# Patient Record
Sex: Male | Born: 1945
Health system: Southern US, Community
[De-identification: ages and names within clinical notes are randomized; demographics above are authoritative.]

## PROBLEM LIST (undated history)

## (undated) DIAGNOSIS — K625 Hemorrhage of anus and rectum: Secondary | ICD-10-CM

## (undated) DIAGNOSIS — I739 Peripheral vascular disease, unspecified: Secondary | ICD-10-CM

## (undated) DIAGNOSIS — C61 Malignant neoplasm of prostate: Secondary | ICD-10-CM

## (undated) DIAGNOSIS — Z720 Tobacco use: Secondary | ICD-10-CM

## (undated) DIAGNOSIS — I10 Essential (primary) hypertension: Secondary | ICD-10-CM

## (undated) DIAGNOSIS — E785 Hyperlipidemia, unspecified: Secondary | ICD-10-CM

## (undated) DIAGNOSIS — I779 Disorder of arteries and arterioles, unspecified: Secondary | ICD-10-CM

## (undated) HISTORY — PX: PENILE PROSTHESIS IMPLANT: SHX240

## (undated) HISTORY — PX: CERVICAL SPINE SURGERY: SHX589

## (undated) HISTORY — DX: Peripheral vascular disease, unspecified: I73.9

## (undated) HISTORY — PX: KNEE CARTILAGE SURGERY: SHX688

## (undated) HISTORY — DX: Hyperlipidemia, unspecified: E78.5

## (undated) HISTORY — DX: Tobacco use: Z72.0

---

## 1999-12-09 ENCOUNTER — Observation Stay (HOSPITAL_COMMUNITY): Admission: RE | Admit: 1999-12-09 | Discharge: 1999-12-10 | Payer: Self-pay | Admitting: Neurosurgery

## 2000-01-02 ENCOUNTER — Encounter: Admission: RE | Admit: 2000-01-02 | Discharge: 2000-01-02 | Payer: Self-pay | Admitting: Neurosurgery

## 2000-02-21 ENCOUNTER — Encounter: Admission: RE | Admit: 2000-02-21 | Discharge: 2000-02-21 | Payer: Self-pay | Admitting: Neurosurgery

## 2000-09-25 ENCOUNTER — Encounter: Admission: RE | Admit: 2000-09-25 | Discharge: 2000-09-25 | Payer: Self-pay | Admitting: Neurosurgery

## 2000-12-07 ENCOUNTER — Encounter: Admission: RE | Admit: 2000-12-07 | Discharge: 2000-12-07 | Payer: Self-pay | Admitting: Urology

## 2000-12-07 ENCOUNTER — Encounter: Payer: Self-pay | Admitting: Urology

## 2001-08-18 HISTORY — PX: PROSTATE SURGERY: SHX751

## 2001-11-04 ENCOUNTER — Encounter: Payer: Self-pay | Admitting: Urology

## 2001-11-04 ENCOUNTER — Encounter: Admission: RE | Admit: 2001-11-04 | Discharge: 2001-11-04 | Payer: Self-pay | Admitting: Urology

## 2001-12-14 ENCOUNTER — Encounter: Payer: Self-pay | Admitting: Urology

## 2001-12-20 ENCOUNTER — Encounter (INDEPENDENT_AMBULATORY_CARE_PROVIDER_SITE_OTHER): Payer: Self-pay | Admitting: Specialist

## 2001-12-20 ENCOUNTER — Inpatient Hospital Stay (HOSPITAL_COMMUNITY): Admission: RE | Admit: 2001-12-20 | Discharge: 2001-12-23 | Payer: Self-pay | Admitting: Urology

## 2004-03-25 ENCOUNTER — Observation Stay (HOSPITAL_COMMUNITY): Admission: RE | Admit: 2004-03-25 | Discharge: 2004-03-26 | Payer: Self-pay | Admitting: Urology

## 2004-11-26 ENCOUNTER — Ambulatory Visit (HOSPITAL_COMMUNITY): Admission: RE | Admit: 2004-11-26 | Discharge: 2004-11-26 | Payer: Self-pay | Admitting: Urology

## 2006-07-01 ENCOUNTER — Emergency Department (HOSPITAL_COMMUNITY): Admission: EM | Admit: 2006-07-01 | Discharge: 2006-07-01 | Payer: Self-pay | Admitting: Emergency Medicine

## 2006-07-11 ENCOUNTER — Emergency Department (HOSPITAL_COMMUNITY): Admission: EM | Admit: 2006-07-11 | Discharge: 2006-07-11 | Payer: Self-pay | Admitting: Emergency Medicine

## 2008-09-29 ENCOUNTER — Ambulatory Visit (HOSPITAL_COMMUNITY): Admission: RE | Admit: 2008-09-29 | Discharge: 2008-09-29 | Payer: Self-pay | Admitting: Urology

## 2011-01-03 NOTE — H&P (Signed)
[place]  Patient:    Alec Harris, Alec Harris                      MRN: [id number] Adm. Date:  12/09/99 Attending:  Cristi Loron, M.D.                         History and Physical  CHIEF COMPLAINT:  Hand numbness and tingling.  HISTORY OF PRESENT ILLNESS:  The patient is a 65 year old black male who was in his usual state of good health until October 2001, who said at that time he began noticing he did not have much strength.  He was seen at [facility] by Dr. Earlene Plater and a head CT was obtained.  It was normal.  The patient was then referred to Dr. Maple Hudson, a neurologist.  Work-up ensued including NCV,_EMGs, as well as cervical MRI.  His cervical MRI was abnormal which was the concept for my consultation.  The patient complains of some "neck discomfort."  His main complaint is bilateral hand weakness, numbness, paresthesias with some stinging.  They feel like they are swollen and stiff.  He has some trouble with walking and says he stumbles when he walks.  He has had some urinary retention.  He has not had an occasion to notice any impotence.  He does not feel he is improving.  He says that when he flexes or extends his neck he has electric shock sensations.  PAST SURGICAL HISTORY:  Positive for knee injury.  MEDICATIONS: 1. Lipitor 10 mg p.o. q.d. 2. Aspirin p.o. q.d.  ALLERGIES:  He has no known drug allergies.  PAST SURGICAL HISTORY:  Knee surgery in 1979.  FAMILY MEDICAL HISTORY:  The patients mother died in 65 secondary to pneumonia, congestive heart failure.  The patients father died, age 43 significant to myocardial infarction.  SOCIAL HISTORY:  The patient is married.  He has two children.  He lives in [place].  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  He smokes one pack-per-day of cigarettes x 40 years.  I highly advised him to quit for his many health ramifications and furthermore in inhibits spine effusions.  He  is aware.  The patient drinks alcohol socially.  He denies drug use.  REVIEW OF SYSTEMS:  Negative except as above.  PHYSICAL EXAMINATION:  VITAL SIGNS:  A pleasantly moderately obese 65 year old black male who walks with an antalgic gait.  Height 5 feet 9 inches, a weight 221 pounds.  HEENT:  Normocephalic, atraumatic.  Pupils equal, round, and reactive to light.  Extraocular muscles are intact.  Sclerae white.  Conjunctivae pink. Oropharynx benign.  Uvula midline.  NECK:  Supple.  There is no masses, meningismus, deformities, tracheal deviation, jugular venous distension, or carotid bruits.  He had limited cervical range of motion.  Lhermitte sign is negative.  Spurlings test is negative.  Thorax is symmetrical.  LUNGS:  Clear to auscultation.  HEART:  Regular rate and rhythm.  ABDOMEN:  Obese, soft, nontender.  EXTREMITIES:  No obvious deformities.  BACK:  Examination is benign.  There is no point tenderness deformities. Straight leg raise testing and Faberes test is negative bilaterally.  NEUROLOGICAL:  The patient is alert and oriented x 3.  Cranial nerves 2-12 are grossly intact bilaterally.  Vision and hearing are grossly normal bilaterally.  Motor strength is 5/5 in his bilateral biceps, triceps,  wrist extensor, psoas, quadriceps, gastrocnemius, extensor hallucis  longus, 4+/5 in his bilateral interosseous and hand grip.  He has some mild left greater than right hand grip weakness.  The patients deep tendon reflexes are 3/4 in his bilateral biceps, triceps, brachioradialis, quadriceps, gastrocnemius.  He has 3 to 4 nonsustained ankle clonus bilaterally.  He has bilateral flexor plantar reflexes.  Sensory examination is grossly normal to light touch in all test of dermatomes bilaterally.  Cerebellar examination is intact to rapid alternating movements of the upper extremity bilaterally.  He has a finger-to-nose mild dysmetria bilaterally.  DIAGNOSTIC STUDIES:  The  patient has a cervical spine plain films performed on October 11, 1999, which demonstrates some degenerative disk disease but otherwise unremarkable.  He had a cervical MRI performed at [facility] on November 02, 1999, which demonstrates a straight cervical spine and a large disk herniation at C3-4 which also causes significant spinal cord compression and spinal stenosis as well as high signal intensity within the spinal cord consistent with gliosis.  ASSESSMENT AND PLAN:  C3-4 herniated nucleus pulposus, spondylosis, spinal stenosis, cervical myelopathy.  I have discussed the situation with the patient and his wife, reviewed their MR-scan with them, pointed out the abnormalities, clearly his symptomatic C3-4.  He has significant spinal stenosis at this level.  I have discussed the various treatment options with him including doing nothing, continuing medical management and surgery both anterior and posterior.  I recommend he consider a C3-4 anterior cervical diskectomy and fusion and plating.  I have described the surgery to him as well as his risks.  I have shown him surgical models.  The patient has weighed the risks, benefits and alternatives of surgery and decided to proceed with a C3-4 anterior cervical diskectomy and fusion and plating. I will do surgery at [facility] on December 09, 1999. DD:  12/09/99 TD:  12/10/99 Job: [id number]

## 2011-01-03 NOTE — Discharge Summary (Signed)
[facility]  Patient:    Alec Harris, Alec Harris Visit Number: [id number] MRN: [id number]          Service Type: SUR Location: 3W 0366 01 Attending Physician:  Lindaann Slough Dictated by:   Lindaann Slough, M.D. Admit Date:  12/20/2001 Discharge Date: 12/23/2001                             Discharge Summary  DISCHARGE DIAGNOSIS:  Adenocarcinoma of prostate, stage T1.  PROCEDURE:  Bilateral pelvic lymphadenectomy and radical retropubic prostatectomy on Dec 20, 2001.  HISTORY OF PRESENT ILLNESS:  The patient is a 65 year old male who had an elevated PSA at 9.88.  Ultrasound biopsy of the prostate was positive for adenocarcinoma with Gleason score of 7.  Lung scan is negative for metastatic disease.  Treatment options were discussed with the patient and he chose to have a radical prostatectomy.  PHYSICAL EXAMINATION:  VITAL SIGNS:  Blood pressure 140/80, pulse 82, respiratory rate 20, temperature 97.  HEENT:  Head is normal.  Pupils are equal, round and reactive to light and accommodation.  Ears, nose and throat within normal limits.  NECK:  Supple with no cervical lymph nodes and no thyromegaly.  CHEST:  Symmetrical.  LUNGS:  Fully expanded and clear to percussion and auscultation.  HEART:  Regular rate and rhythm with no murmurs, rubs or gallops.  ABDOMEN:  Soft, nondistended, nontender.  Liver, spleen and kidneys not palpable.  No organomegaly.  Bowel sounds normal.  GENITALIA:  Penis is uncircumcised.  Meatus is normal.  Scrotum is unremarkable.  Testicles, cords, epididymis within normal limits.  RECTAL:  Sphincter tone normal.  Prostate enlarged at 40 g, firm and nontender.  LABORATORY DATA AND X-RAY FINDINGS:  Hemoglobin on admission 17.1, hematocrit 49.2 and WBC 6.2.  Sodium 140, potassium 4.0, BUN 15, creatinine 1.2, glucose 150.  Alk phos normal at 69.  Urinalysis is normal.  Urine culture with no growth.  Chest x-ray showed mild  COPD.  EKG is normal.  HOSPITAL COURSE:  The patient had bilateral pelvic lymphadenectomy and radical retropubic prostatectomy on Dec 20, 2001.  He remained afebrile.  He was started on liquid diet the evening of the surgery and tolerated his diet well. Wound was clean and dry.  The drainage from the [facility] drain gradually decreased.  On Dec 23, 2001, the drainage from the drain was down to 15 cc. The Foley catheter was draining clear urine.  He was then discharged home.  DIET:  Regular diet.  DISCHARGE MEDICATIONS: 1. Percocet one or two tablets p.o. q.4h. p.r.n. pain. 2. Levaquin 250 mg daily to start on May 15.  ACTIVITY:  No lifting, straining or driving until further advised.  DISCHARGE LABORATORY DATA AND X-RAY FINDINGS:  Pathology report showed one positive lymph node and extracapsular extension of the carcinoma with a Gleason score of 8.  Right and left seminal vesicles were involved by the tumor.  SPECIAL INSTRUCTIONS:  The plan is to remove the Foley catheter in two weeks and repeat PSA in six weeks.  If the PSA is not undetectable, we will then discuss with the patient regarding hormonal manipulation.  CONDITION ON DISCHARGE:  Improved. Dictated by:   Lindaann Slough, M.D. Attending Physician:  Lindaann Slough DD:  12/23/01 TD:  12/27/01 Job: [id number]

## 2011-01-03 NOTE — Discharge Summary (Signed)
NAMETYNAN, BOESEL                            ACCOUNT NO.:  [id number]   MEDICAL RECORD NO.:  [id number]                   PATIENT TYPE:  OBV   LOCATION:  0376                                 FACILITY:  [facility]   PHYSICIAN:  Lindaann Slough, M.D.               DATE OF BIRTH:  [birth year]   DATE OF ADMISSION:  03/25/2004  DATE OF DISCHARGE:  03/26/2004                                 DISCHARGE SUMMARY   DISCHARGE DIAGNOSES:  1. Erectile impotence.  2. Carcinoma of prostate, status post radical prostatectomy and phimosis.   PROCEDURE DONE:  Insertion of inflatable penile prosthesis and circumcision  on March 25, 2004.   The patient is a 65 year old male who had a radical prostatectomy in January  2003.  He had been having difficulty having erections since and he had been  using intracorporeal PGE1. He wanted to have a penile prosthesis. He was  also found on physical examination to have phimosis and he was admitted on  March 25, 2004, for the procedure.   On physical examination, lungs were clear, heart had a regular rate and  rhythm, and abdomen was soft, nondistended, and nontender. He has a well-  healed suprapubic scar. The bladder was not distended. He had phimosis and  scrotal contents within normal limits. On rectal examination sphincter tone  normal. Prostatic fossa was flat and nontender.   BUN was 20, creatinine 1.1, sodium 136, potassium 4.2, glucose 107.  Hemoglobin 14.5, hematocrit 42.3, and WBC 7.7.  Urinalysis is normal. Urine  culture showed no growth.   Chest x-ray showed no evidence of active disease. His EKG is normal.   The patient had circumcision and insertion of an inflatable penile  prosthesis on March 25, 2004. Postoperative course was uneventful. He  remained afebrile. The Foley catheter was removed on March 26, 2004. He was  voiding well. His urine was clear.  He had minimal penile and scrotal  swelling. The inflate/deflate pump is in good position in the  scrotum and  the prosthesis is functioning well.  He was then discharged home on Percocet  one or two tablets q.4h. p.r.n. pain and Keflex 500 mg to take one tablet  q.i.d.   DISCHARGE DIET:  Regular.   CONDITION ON DISCHARGE:  Improved.   The patient is instructed not to do any lifting, straining, or driving until  further advised.                                               Lindaann Slough, M.D.    MN/MEDQ  D:  03/26/2004  T:  03/26/2004  Job:  [id number]

## 2011-01-03 NOTE — H&P (Signed)
[name], STOLAR                            ACCOUNT NO.:  [id number]   MEDICAL RECORD NO.:  [id number]                   PATIENT TYPE:  AMB   LOCATION:  DAY                                  FACILITY:  [facility]   PHYSICIAN:  Lindaann Slough, M.D.               DATE OF BIRTH:  01-03-1946   DATE OF ADMISSION:  03/25/2004  DATE OF DISCHARGE:                                HISTORY & PHYSICAL   CHIEF COMPLAINT:  Difficulty achieving erection.   HISTORY OF PRESENT ILLNESS:  Patient is a 65 year old male who had a radical  prostatectomy Dec 20, 2001.  Postop prostatectomy he had been having  difficulty achieving erections.  He has been using PGE1, however, he wants  to have a penile prosthesis.  He is admitted today for the procedure.   PAST MEDICAL HISTORY:  He does not have hypertension or diabetes.   FAMILY HISTORY:  His father died of a heart attack.  His mother died at age  47.  One of his brothers died of throat cancer.  His mother had  hypertension.  He has one sister and one brother living.   SOCIAL HISTORY:  He is married, has three children, has smoked about a pack  a day for the past 32 years and drinks socially.   ALLERGIES:  He has no known drug allergies.   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   REVIEW OF SYSTEMS:  PULMONARY:  He has no cough, no shortness of breath, no  hemoptysis.  CARDIOVASCULAR:  No palpitations, no chest pain.  GI:  No  nausea and no vomiting, no diarrhea or constipation.  GU:  He is voiding  well, he has no incontinence, and he has difficulty with erections.   PHYSICAL EXAMINATION:  GENERAL:  This is a well-built 65 year old male in no  acute distress.  VITAL SIGNS:  His blood pressure today is 144/72, pulse 76, respirations 16,  temperature 97.6.  HEENT:  Head is normal.  Pupils are equal and reactive to light and  accommodation.  Ears, nose and throat within normal limits.  NECK:  Supple.  No cervical lymph nodes.   No thyromegaly.  CHEST:  Symmetrical.  LUNGS:  Fully expanded and clear to percussion and auscultation.  HEART:  Regular rhythm.  No murmur.  No gallops.  ABDOMEN:  Soft, nondistended, nontender.  Liver, spleen and kidneys not  palpable.  No organomegaly.  Bowel sounds normal.  He has a well-healed  suprapubic scar.  GU:  Penis is uncircumcised and meatus is normal.  Scrotum is normal in  appearance.  There is no hydrocele, no testicular mass.  Cords and  epididymis are within normal limits.  EXTREMITIES:  Within normal limits.  No pedal edema.  No deformities.  Good  peripheral pulses.  RECTAL:  Sphincter tone is normal.  Prostatic fossa is flat and nontender.  ADMITTING DIAGNOSES:  1. Impotence.  2. Adenocarcinoma of prostate status post radical prostatectomy.                                               Lindaann Slough, M.D.    MN/MEDQ  D:  03/25/2004  T:  03/25/2004  Job:  [id number]

## 2011-01-03 NOTE — Op Note (Signed)
[facility]  Patient:    Alec Harris, FESSEL Visit Number: [id number] MRN: [id number]          Service Type: SUR Location: 3W 0366 01 Attending Physician:  Lindaann Slough Dictated by:   Lindaann Slough, M.D. Proc. Date: 12/20/01 Admit Date:  12/20/2001   CC:         Sigmund I. Patsi Sears, M.D.   Operative Report  PREOPERATIVE DIAGNOSIS:  Adenocarcinoma of prostate.  POSTOPERATIVE DIAGNOSIS:  Adenocarcinoma of prostate.  PROCEDURE:  Bilateral pelvic lymphadenectomy and radical retropubic prostatectomy.  SURGEON:  Lindaann Slough, M.D.  ASSISTANT:  Sigmund I. Patsi Sears, M.D.  ANESTHESIA:  General.  INDICATION:  The patient is a 65 year old male, who had an elevated PSA at 9.88.  Ultrasound biopsy of the prostate showed adenocarcinoma, Gleason score 7.  Bone scans negative for metastatic disease.  Treatment options were discussed with the patient, and he chose to have a radical prostatectomy.  He is scheduled today for the procedure.  DESCRIPTION OF PROCEDURE:  Under general anesthesia, the patient was prepped and draped and placed in the supine position.  A #24 Foley catheter was inserted in the bladder.  A longitudinal incision was made from the symphysis pubis to about 2 cm below the umbilicus.  The incision was carried down to the rectus fascia which was then incised.  The iliac fossae were then entered. Bilateral pelvic lymphadenectomy was done using the bladder, the pelvic sidewalls, the obturator nerve and vessels, and the iliac vessels as landmarks.  Then the endopelvic fascia was incised from the apex to the base of the prostate.  On both sides, the puboprostatic ligaments were then incised.  A Hohenfellner clamp was passed behind the dorsal vein complex, and the dorsal vein complex was doubly ligated with #1 Vicryl and cut in between ligatures.  The dorsal vein complex was then suture ligated with 2-0 Vicryl. The anterior wall of the  urethra was incised.  The Foley catheter was then pulled through the incision, and the posterior urethra was incised.  The apex of the prostate was bluntly and sharply dissected from the rectum.  The prostate was then dissected from the rectum.  The lateral pedicles were then ligated with #0 silk and cut in between ligatures.  Then the anterior bladder neck was incised, and the Foley catheter was pulled through the incision. Then the posterior bladder neck was incised, and the incision was carried through the seminal vesicles, and the right vas was identified and freed from the surrounding tissues and ligated with #1 Vicryl and cut in between ligatures.  The same procedure was done with the left vas.  Then the seminal vesicles were dissected from the surrounding tissues, and the specimen was removed in toto.  Hemostasis was then completed with electrocautery.  Then the bladder neck was closed down to admit the tip of my index finger with 2-0 Vicryl, and the mucosa of the bladder neck was everted with 4-0 chromic.  Then five sutures of 3-0 Monocryl were placed on the urethra at the 3, 5, 7, 9, and 12 oclock position.  Then a #20 Foley catheter was inserted in the bladder. Then the urethral sutures were approximated to the bladder neck.  The wound was then irrigated with bug juice.  Then a Blake drain was placed in the wound and brought out through a separate stab wound.  Then the fascia was closed with 0 PDS, and the skin was closed with 4-0 Monocryl using subcuticular sutures.  ESTIMATED BLOOD  LOSS:  1500 cc.  BLOOD REPLACEMENT:  None.  Needle, sponge, and instrument counts were correct on two occasions.  The patient tolerated the procedure well and left the OR in satisfactory condition to postanesthesia care unit. Dictated by:   Lindaann Slough, M.D. Attending Physician:  Lindaann Slough DD:  12/20/01 TD:  12/20/01 Job: [id number]

## 2011-01-03 NOTE — Op Note (Signed)
[facility]  Patient:    Alec Harris, Alec Harris                       MRN: [id number] Proc. Date: 12/09/99 Adm. Date:  [id number] Disc. Date: [id number] Attending:  Tressie Stalker D                           Operative Report  BRIEF HISTORY:  The patient is a 65 year old black male who began having some weakness on, September 28, 1999, with progressive obvious cervical myelopathy. He was worked up with cervical MRI and it demonstrated an enlarged disc herniation at C3-4.  The patient weighed the risks, benefits and alternatives of surgery and decided to proceed with an anterior cervical discectomy fusion and plating.  PREOPERATIVE DIAGNOSES:  C3-4 degenerative disk disease, herniated nucleus pulposus, spinal stenosis, cervical myelopathy.  POSTOPERATIVE DIAGNOSES:  C3-4 degenerative disk disease, herniated nucleus pulposus, spinal stenosis, cervical myelopathy.  PROCEDURE:  C3-4 anterior cervical discectomy, interbody iliac crest structural allograft arthrodesis, anterior cervical plating C3-4 with use of Codman anterior cervical plate and screws.  SURGEON:  Cristi Loron, M.D.  ASSISTANT:  Alanson Aly. Roxan Hockey, M.D.  ANESTHESIA:  General endotracheal.  ESTIMATED BLOOD LOSS:  150 cc.  SPECIMENS:  None.  DRAINS:  None.  COMPLICATIONS:  None.  DESCRIPTION OF PROCEDURE:  The patient was brought to the operating room by the anesthesia team.  General endotracheal anesthesia was induced.  The patient remained in the supine position and a roll was placed under his shoulders which placed his neck in a slight extension.  His anterior cervical region was then prepared with Betadine scrub and Betadine solution.  Sterile drapes were applied.  I then injected the area to be incised with Marcaine with epinephrine solution.  I made a left to side incision in one of the patients preexisting skin folds.  I used the Metzenbaum scissors to dissect down to  the platysma muscle which I divided along the direction of his skin incision with the Metzenbaum scissors.  I then dissected medial to the sternocleidomastoid muscle, jugular vein and carotid artery with the Metzenbaum scissors.  I then bluntly dissected down to the anterior cervical spine carefully identifying the esophagus and retracted it medially.  I then cleared the soft tissue from the anterior cervical spine using Kitner swabs.  I inserted a bent spinal needle into the exposed interspace.  I obtained an intraoperative radiograph to confirm my location.  I then used electrocautery to attach the medial border up along the sternocleidomastoid muscle bilaterally from the C3-4 interspace.  I inserted the cats paw self-retaining retractor for exposure and then incised the C3-4 intervertebral disc with the 15 blade scalpel.  Of note there was a large anterior osteophyte which I had to remove with the osteophyte tool.  I then performed a partial discectomy using the pituitary forceps and the Carlens curets.  I then inserted distraction pins at C3-4, distracted the interspace and then using Midas Rex high speed drill to decorticate the vertebral bone end plates at O1-3 and drill away the remainder of the intervertebral discs at C3-4.  I drilled down until I had thinned out the posterior and longitudinal ligament.  I incised the ligament with an arachnoid knife and then removed the remainder of the ligament with a Kerrison punch, undercut on the intervertebral bone plates at Y8-6.  I then went laterally and identified the  bilateral C4 nerve root and performed a foraminotomy about it. At this point the spinal cord and the bilateral C4 nerve roots were well decompressed.  I then turned my attention to the arthrodesis.  I obtained a iliac crest tricortical allograft bone graft to the fascia these at approximate dimensions - approximately 7 mm in height, 1 cm in depth.  I inserted this bone  graft into the interspaces at C3-4, removed the distraction pins, there was good snug fit of bone graft.  After completed the arthrodesis I now turned my attention to the anterior spinal instrumentation.  I obtained the appropriate length, anterior cervical plate laying it on the anterior aspect of the C3-4.  Drilled two holes in C3, 2 at C4, tapped these holes and secured the plate and intervertebral bodies with titanium screws.  I then obtained the intraoperative radiograph and it demonstrated good position of the plates, screws, and interbody graft.  I then secured the screws to the plate with the Cam tightener.  I then copiously irrigated the wound out with bacitracin solution, removed the solution and then achieved stringent hemostasis with bipolar eletrocautery.  I then removed the cats paw self-retaining retractor and reapproximated the platysma muscle with interrupted 3-0 Vicryl and the subcutaneous with interrupted 3-0 Vicryl, skin with steri-strips and benzoin.  The wound was then coated with bacitracin ointment, sterile dressing applied.  The drapes were removed and the patient was subsequently extubated by the anesthesia team and transported to the post anesthesia care unit in stable condition.  All sponge, instrument and needle counts were correct at the end of the case. DD:  12/09/99 TD:  12/10/99 Job: [id number]

## 2011-01-03 NOTE — Op Note (Signed)
Alec Harris, Alec Harris                            ACCOUNT NO.:  [id number]   MEDICAL RECORD NO.:  [id number]                   PATIENT TYPE:  OBV   LOCATION:  0376                                 FACILITY:  [facility]   PHYSICIAN:  Lindaann Slough, M.D.               DATE OF BIRTH:  1946-03-23   DATE OF PROCEDURE:  03/25/2004  DATE OF DISCHARGE:                                 OPERATIVE REPORT   PREOPERATIVE DIAGNOSES:  1. Organic erectile dysfunction.  2. Phimosis.   POSTOPERATIVE DIAGNOSES:  1. Organic erectile dysfunction.  2. Phimosis.   PROCEDURES PERFORMED:  1. Insertion of AMS 700CX penile prosthesis.  2. Circumcision.   SURGEON:  Lindaann Slough, M.D.   RESIDENT SURGEON:  Thyra Breed, M.D.   ANESTHESIA:  General endotracheal.   ESTIMATED BLOOD LOSS:  Less than 30 cc.   DRAINS:  A 16 French coude catheter to straight drain.   COMPLICATIONS:  None.   INDICATIONS FOR PROCEDURE:  Alec Harris is a pleasant 65 year old male with a  history of prostate cancer, status post radical retropubic prostatectomy.  Patient has suffered since his procedure from erectile dysfunction.  He has  failed phosphodiesterase inhibitors; however, has been able to maintain  adequate erection with injection therapy; however, the patient is now unable  to achieve satisfactory erections with any injection therapy.  Therefore, he  wishes to undergo insertion of a penile prosthesis.  All of the risks,  benefits and alternatives of a prosthesis have been described in detail, and  the patient is willing to proceed.  Additionally, he does have a significant  phimosis with some difficulty retracting his foreskin.  The patient has  consented to undergo a circumcision at the time of his ITP insertion as  well.   PROCEDURE IN DETAIL:  Following identification by his arm bracelet, the  patient was brought to the operating room and placed in the supine position.  The patient was brought to the operating room.   Here, he underwent  successful administration of spinal anesthesia and was given IV sedation.  He was then placed in a supine position.  He received preoperative IV  antibiotics, including Ancef and gentamicin.  His lower abdomen and  genitalia were then shaved, and he was given a 10-minute Betadine scrub to  his genitalia prior to being prepped with Betadine and draped in the usual  sterile fashion.  We created a transverse incision, approximately 6-8 cm  incision approximately one finger breadth above the pubic symphysis in the  midline to facilitate a retropubic approach to insertion of the prosthesis.  Bovie electrocautery was then used to carry the incision down through the  subcutaneous tissue.  We then developed a plane of the tissue overlying the  corpora.  This tissue was then carefully dissected from the overlying  corpora using Metzenbaum scissors and exposing the tunica of the corpora  bilaterally.  In both  corpora, we then placed two stay sutures of 2-0  Vicryl.  We began with the left corpora and made a corporotomy between the  two stay sutures with a 15 blade knife.  We then performed dilation of the  left corpora with sequential dilators up to 14 mm on each side using Brook  dilators proximally and distally.  Corporeal measurement was taken and  initially showed approximately 18 cm.  We then performed an identical  procedure on the right corpora, again sequentially dilating to 14 mm using  the [facility] dilator and obtaining a measurement of 18 cm.  We decided to  attempt the placement of a 16 cm device with 3 cm rear-tip extendors.  It  should be mentioned that prior to dilation, placement of the cylinders with  a tight ring of fibrotic tissue proximally in the left corpora.  Special  care was taken, including the use of a Hagar dilator to repair the corpora  for placement of the cylinder.  We then placed the 15 cm device with 3 cm  rear-tip extendors bilaterally.  It appeared  the cylinders fit into position  without difficulty.  Prior to placement of the cylinders, we placed four  additional stay sutures on both the right and left corporotomy to facilitate  closure.  Again, 2-0 Vicryl was used.  Placement of the cylinders consisted  of passage of a Furlow tool into the distal corporotomy on both sides.  The  cylinders had been attached to the [place] needle with a preplaced pull  string.  The [place] Evener tool was then placed in the corporotomy incision and  passed out to the lateral aspect of the glands, well away from the urethra,  which could be palpated with the Foley catheter.  The [place] Evener tool was then  fired, pushing the [place] needle through the glans bilaterally.  Extreme care  was taken to not fire the needle through the urethra.  There was no evidence  of urethral trauma after passage of both cylinders.  The 3 cm rear-tip  extenders were then attached to the proximal aspect of the cylinder on both  sides, and the cylinders were pushed into the proximal corporotomy incision.  Again, the device appeared to sit well without any evidence of buckling.  The device was then cycled, and there appeared to be an adequate erection  that was straight and suitable for intercourse.  There was some difficulty  seating the left corporal cylinder; however, this appeared to be rectified.  At this point, the corporotomy incisions were closed bilaterally with the  preplaced 2-0 Vicryl stay sutures.  Through our incision, we then used the  Bovie to dissect superiorly to the rectus fascia.  The rectus fascia was  then opened approximately 2 cm, and the midline was identified.  The  surgeon's finger was then used to create a space beneath the rectus muscles  for placement of the reservoir.  The reservoir was then pushed into this  space and appeared to have plenty of room for inflation.  We then created a dartos pouch bluntly using the surgeon's finger into the most dependent part   of the right hemiscrotum.  A Babcock clamp was used to hold the reservoir in  position while we completed the procedure.  At this point as well as  throughout the procedure, the wound was copiously irrigated with antibiotic  solution.  The surgeon's hands were also washed frequently throughout the  case with sterile saline.  The reservoir was  then filled with approximately  65 ml of sterile water, and there was no back pressure in the syringe.  Next, connections were created between the pump and the cylinders and the  reservoir.  All air was flushed from the tubing prior to connections being  made.  The wound was then again copiously irrigated.  One final cycling  demonstrated that the left corporal cylinder did not extend as far distally  as did the right.  Given some question of incorrect seating, we removed the  sutures, closing the corporotomy on the left.  This revealed some buckling  and improper seating of the proximal portion of the left corporal cylinder.  We removed the cylinder and again used the [facility] dilator as well as the  Hagar dilator to again expand a small ring of fibrotic tissue distally, this  time with success.  The cylinder was then replaced, and the proximal portion  seated nicely without any evidence of buckling cycling prior to closure of  the corporotomy, again with excellent seating of the cylinder and distally,  and both the left and the right cylinders could be palpated beneath the  glans in an equal position.  We had preplaced four 2-0 Vicryl sutures on  either side of the corporotomy, which were closed.  At this time, the wound  was copiously irrigated.  The fascia overlying the reservoir was then closed  using a running 3-0 Vicryl suture.  We then reapproximated Scarpa's fascia  using a 2-0 Vicryl suture.  A 5-0 Monocryl was used to close the incision in  a subcuticular fashion.   We then turned our attention to the circumcision.  With the foreskin   retracted, we created an incision overlying the corona of the glans, which  could be easily seen.  Once this incision was created using a #15 blade  knife, we then retracted the foreskin.  A second incision was made  approximately 15 mm below the corona of the glans in a circumferential  fashion.  The intervening piece of foreskin was then elevated, and  Metzenbaum scissors used to remove the intervening skin in its entirety with  care taken to avoid the glans.  Following this, there appeared to be  adequate skin available on the penile shaft for reconstruction.  Excellent  hemostasis was obtained using Bovie electrocautery on the penile shaft.  A 4-  0 chromic suture was then used to approximate both the dorsal and ventral  midline.  A 4-0 interrupted suture was then used in an interrupted fashion  circumferentially to continue closure of the incision.  Once the circumcising incision was closed, the incision was washed and dried.  Vaseline gauze was used to wrap the suture line.  This was covered by a  rolling Kerlix gauze.  Coban tape was used to complete the penile dressing.  We then washed the lower abdomen transverse incision.  Steri-Strips were  applied followed by Telfa and a Tegaderm pad.  The patient tolerated the  procedure well, and there were no complications.  All sponge, needle, and  instrument counts were correct x2.  Dr. Brunilda Payor was present and participated in  the entire procedure, as he was the responsible surgeon.   DISPOSITION:  Following the procedure, the patient was transferred to the  post anesthesia care unit in stable condition.  From here, he will be  transferred to the floor for overnight observation and removal of his  catheter in the morning.     Thyra Breed, MD  Lindaann Slough, M.D.    EG/MEDQ  D:  03/25/2004  T:  03/25/2004  Job:  [id number]

## 2011-01-03 NOTE — H&P (Signed)
[facility]  Patient:    Alec Harris, Alec Harris Visit Number: [id number] MRN: [id number]          Service Type: SUR Location: 3W 0366 01 Attending Physician:  Lindaann Slough Dictated by:   Lindaann Slough, M.D. Admit Date:  12/20/2001                           History and Physical  CHIEF COMPLAINT:  Adenocarcinoma of prostate.  HISTORY OF PRESENT ILLNESS:  The patient is a 65 year old male who has an elevated PSA at 9.88.  An ultrasound biopsy of the prostate was positive for adenocarcinoma, Gleason score 7.  Bone scan is negative for metastatic disease.  Treatment options were discussed with the patient, and he chose to have a radical prostatectomy.  He is admitted today for the procedure.  PAST MEDICAL HISTORY:  Negative for hypertension or diabetes.  He had a cervical laminectomy in April 2001.  FAMILY HISTORY:  His father died of a heart attack.  His mother died at the age of 46.  One of his brothers died of throat cancer.  His mother had hypertension.  He has one sister and one brother living.  SOCIAL HISTORY:  He is married, has three children.  He has smoked about a pack a day for the past 30 years.  He drinks socially.  ALLERGIES:  No known drug allergies.  MEDICATIONS:  None.  REVIEW OF SYSTEMS:  RESPIRATORY:  No cough, no shortness of breath, no hemoptysis.  CARDIOVASCULAR:  No palpitations, no chest pain. GASTROINTESTINAL:  No nausea, no vomiting, no diarrhea or constipation. GENITOURINARY:  He has no frequency, hesitancy, dysuria, or straining on urination.  He has difficulty achieving erections and has been using PGE1.  PHYSICAL EXAMINATION:  VITAL SIGNS:  Blood pressure is 140/80, pulse 82, respirations 20, temperature 97.  GENERAL:  This is a well-developed 65 year old male in no acute distress.  HEENT:  His head is normal.  Pupils are equal and reactive to light and accommodation.  Ears, nose, and throat within normal  limits.  NECK:  Supple.  No cervical lymph nodes.  No thyromegaly.  CHEST:  Symmetrical.  Lungs are fully expanded and clear to percussion and auscultation.  CARDIAC:  Regular rate, no murmur, no gallops.  ABDOMEN:  Soft, nondistended, nontender.  Liver, spleen, and kidneys not palpable.  No organomegaly.  Bowel sounds normal.  GENITOURINARY:  Penis is uncircumcised, and meatus is normal.  Scrotum is unremarkable.  Testicles, cords, and epididymis are within normal limits.  RECTAL:  Rectal tone is normal.  Prostate is enlarged, 40 g, firm, nontender. Seminal vesicles not palpable.  IMPRESSION: 1. Adenocarcinoma of the prostate. 2. Erectile dysfunction. Dictated by:   Lindaann Slough, M.D. Attending Physician:  Lindaann Slough DD:  12/20/01 TD:  12/21/01 Job: [id number]

## 2011-01-31 ENCOUNTER — Other Ambulatory Visit (HOSPITAL_COMMUNITY): Payer: Self-pay | Admitting: Urology

## 2011-01-31 DIAGNOSIS — C61 Malignant neoplasm of prostate: Secondary | ICD-10-CM

## 2011-02-07 ENCOUNTER — Encounter (HOSPITAL_COMMUNITY): Admission: RE | Admit: 2011-02-07 | Payer: Medicare Other | Source: Ambulatory Visit

## 2011-02-07 ENCOUNTER — Encounter (HOSPITAL_COMMUNITY)
Admission: RE | Admit: 2011-02-07 | Discharge: 2011-02-07 | Disposition: A | Discharge status: Home / Self Care | Payer: Medicare Other | Source: Ambulatory Visit | Attending: Urology | Admitting: Urology

## 2011-02-07 ENCOUNTER — Encounter (HOSPITAL_COMMUNITY): Payer: BC Managed Care – PPO

## 2011-02-07 ENCOUNTER — Ambulatory Visit (HOSPITAL_COMMUNITY)
Admission: RE | Admit: 2011-02-07 | Discharge: 2011-02-07 | Disposition: A | Discharge status: Home / Self Care | Payer: Medicare Other | Source: Ambulatory Visit | Attending: Urology | Admitting: Urology

## 2011-02-07 ENCOUNTER — Other Ambulatory Visit (HOSPITAL_COMMUNITY): Payer: Self-pay | Admitting: Urology

## 2011-02-07 ENCOUNTER — Ambulatory Visit (HOSPITAL_COMMUNITY): Admission: RE | Admit: 2011-02-07 | Payer: Medicare Other | Source: Ambulatory Visit

## 2011-02-07 ENCOUNTER — Encounter (HOSPITAL_COMMUNITY): Payer: Self-pay

## 2011-02-07 DIAGNOSIS — C7951 Secondary malignant neoplasm of bone: Secondary | ICD-10-CM | POA: Insufficient documentation

## 2011-02-07 DIAGNOSIS — C61 Malignant neoplasm of prostate: Secondary | ICD-10-CM

## 2011-02-07 HISTORY — DX: Malignant neoplasm of prostate: C61

## 2011-02-07 MED ORDER — TECHNETIUM TC 99M MEDRONATE IV KIT
23.2000 | PACK | Freq: Once | INTRAVENOUS | Status: AC | PRN
  Administered 2011-02-07: 23.2 via INTRAVENOUS

## 2011-07-24 ENCOUNTER — Inpatient Hospital Stay (HOSPITAL_COMMUNITY)
Admission: EM | Admit: 2011-07-24 | Discharge: 2011-07-27 | DRG: 378 | Disposition: A | Discharge status: Home / Self Care | Payer: Medicare Other | Attending: Gastroenterology | Admitting: Gastroenterology

## 2011-07-24 ENCOUNTER — Other Ambulatory Visit: Payer: Self-pay | Admitting: Gastroenterology

## 2011-07-24 ENCOUNTER — Encounter (HOSPITAL_COMMUNITY): Payer: Self-pay | Admitting: *Deleted

## 2011-07-24 ENCOUNTER — Inpatient Hospital Stay (HOSPITAL_COMMUNITY): Admission: AD | Admit: 2011-07-24 | Payer: Self-pay | Source: Ambulatory Visit | Admitting: Gastroenterology

## 2011-07-24 DIAGNOSIS — K5731 Diverticulosis of large intestine without perforation or abscess with bleeding: Principal | ICD-10-CM | POA: Diagnosis present

## 2011-07-24 DIAGNOSIS — R112 Nausea with vomiting, unspecified: Secondary | ICD-10-CM | POA: Diagnosis present

## 2011-07-24 DIAGNOSIS — K625 Hemorrhage of anus and rectum: Secondary | ICD-10-CM

## 2011-07-24 DIAGNOSIS — D62 Acute posthemorrhagic anemia: Secondary | ICD-10-CM | POA: Diagnosis present

## 2011-07-24 DIAGNOSIS — I951 Orthostatic hypotension: Secondary | ICD-10-CM | POA: Diagnosis present

## 2011-07-24 DIAGNOSIS — R7309 Other abnormal glucose: Secondary | ICD-10-CM | POA: Diagnosis present

## 2011-07-24 DIAGNOSIS — D649 Anemia, unspecified: Secondary | ICD-10-CM

## 2011-07-24 HISTORY — DX: Hemorrhage of anus and rectum: K62.5

## 2011-07-24 LAB — ABO/RH: ABO/RH(D): A POS

## 2011-07-24 LAB — CBC
HCT: 26.7 % — ABNORMAL LOW (ref 39.0–52.0)
MCHC: 34.1 g/dL (ref 30.0–36.0)
MCV: 89.9 fL (ref 78.0–100.0)
RDW: 14.6 % (ref 11.5–15.5)

## 2011-07-24 LAB — PROTIME-INR: INR: 1.01 (ref 0.00–1.49)

## 2011-07-24 LAB — GLUCOSE, CAPILLARY: Glucose-Capillary: 147 mg/dL — ABNORMAL HIGH (ref 70–99)

## 2011-07-24 MED ORDER — KETOCONAZOLE 200 MG PO TABS
200.0000 mg | ORAL_TABLET | Freq: Three times a day (TID) | ORAL | Status: DC
  Administered 2011-07-24: 200 mg via ORAL
  Filled 2011-07-24 (×4): qty 1

## 2011-07-24 MED ORDER — SODIUM CHLORIDE 0.9 % IV BOLUS (SEPSIS)
1000.0000 mL | Freq: Once | INTRAVENOUS | Status: AC
  Administered 2011-07-24: 1000 mL via INTRAVENOUS

## 2011-07-24 MED ORDER — HYDROCORTISONE 20 MG PO TABS
20.0000 mg | ORAL_TABLET | Freq: Every day | ORAL | Status: DC
  Administered 2011-07-24: 20 mg via ORAL
  Filled 2011-07-24 (×2): qty 1

## 2011-07-24 MED ORDER — PEG 3350-KCL-NA BICARB-NACL 420 G PO SOLR
4000.0000 mL | Freq: Once | ORAL | Status: AC
  Administered 2011-07-24: 4000 mL via ORAL
  Filled 2011-07-24: qty 4000

## 2011-07-24 MED ORDER — SODIUM CHLORIDE 0.9 % IV SOLN
INTRAVENOUS | Status: DC
  Administered 2011-07-24 – 2011-07-25 (×2): via INTRAVENOUS

## 2011-07-24 NOTE — ED Notes (Addendum)
Pt reports rectal bleeding x 2 days.  Reports he was sent here from Dr. Kenna Gilbert office.  Had a rectal exam done, positive for internal hemorrhoids.  Denies any pain at this time.

## 2011-07-24 NOTE — ED Notes (Signed)
MD at bedside. Dr. Elnoria Howard with GI

## 2011-07-24 NOTE — Progress Notes (Signed)
Pt came to unit alert and oriented. Pt vs are temp-99.3,BP-114/80,Pulse-111,Resp-20,100% on RA. Pt oriented to the unit call bell within reach.

## 2011-07-24 NOTE — ED Provider Notes (Signed)
Patient is sent to ER by GI for a direct admission for rectal bleeding however due to bed status unable to directly admit and therefore Dr. Elnoria Howard met patient in ER for admission. Dr. Elnoria Howard is taking full care of patient with patient tachycardic but VSS and patient in no distress currently. Dr. Elnoria Howard to write admission orders.   Jenness [place], [place] 07/24/11 1859

## 2011-07-24 NOTE — H&P (Signed)
Alec Harris is an 65 y.o. male.   Chief Complaint: Hematochezia HPI: This is a 65 year old gentleman with complaints of hematochezia that started this past Sunday.  At that time he had one episode, but when it persisted on Monday he call the office the schedule an appointment.  His bleeding continued to persist and today he had several painless bouts of hematochezia.  When he stands up he will feel a little dizzy, but he denies any issues with SOB or chest pain.  In 2008 he had a colonoscopy by Dr. Loreta Ave for routine purposes.  He is not certain if he had any diverticula.  The patient denies any abdominal pain, nausea, vomiting, fevers, chills, diarrhea, or constipation.  Past Medical History  Diagnosis Date  . Prostate cancer   . Rectal bleeding 07/24/11    currently    Past Surgical History  Procedure Date  . Prostate surgery 2003    for  prostate cancer  . Knee cartilage surgery     surgery for a torn ligament  . Cervical spine surgery approx. 10 years ago    disc removed from neck    History reviewed. No pertinent family history. Social History:  reports that he has been smoking.  He has never used smokeless tobacco. He reports that he drinks alcohol. He reports that he does not use illicit drugs.  Allergies: No Known Allergies  Medications Prior to Admission  Medication Dose Route Frequency Provider Last Rate Last Dose  . 0.9 %  sodium chloride infusion   Intravenous Continuous Jordan Hawks Falen Lehrmann 200 mL/hr at 07/24/11 2143    . hydrocortisone (CORTEF) tablet 20 mg  20 mg Oral Daily Theda Belfast   20 mg at 07/24/11 2039  . ketoconazole (NIZORAL) tablet 200 mg  200 mg Oral TID Jordan Hawks Danni Shima   200 mg at 07/24/11 2221  . polyethylene glycol-electrolytes (NuLYTELY/GoLYTELY) solution 4,000 mL  4,000 mL Oral Once Theda Belfast   4,000 mL at 07/24/11 2221  . sodium chloride 0.9 % bolus 1,000 mL  1,000 mL Intravenous Once Theda Belfast   1,000 mL at 07/24/11 2032   No current  outpatient prescriptions on file as of 07/24/2011.    Results for orders placed during the hospital encounter of 07/24/11 (from the past 48 hour(s))  CBC     Status: Abnormal   Collection Time   07/24/11  7:45 PM      Component Value Range Comment   WBC 24.2 (*) 4.0 - 10.5 (K/uL)    RBC 2.97 (*) 4.22 - 5.81 (MIL/uL)    Hemoglobin 9.1 (*) 13.0 - 17.0 (g/dL)    HCT 16.1 (*) 09.6 - 52.0 (%)    MCV 89.9  78.0 - 100.0 (fL)    MCH 30.6  26.0 - 34.0 (pg)    MCHC 34.1  30.0 - 36.0 (g/dL)    RDW 04.5  40.9 - 81.1 (%)    Platelets 227  150 - 400 (K/uL)   PROTIME-INR     Status: Normal   Collection Time   07/24/11  7:45 PM      Component Value Range Comment   Prothrombin Time 13.5  11.6 - 15.2 (seconds)    INR 1.01  0.00 - 1.49    TYPE AND SCREEN     Status: Normal   Collection Time   07/24/11  7:45 PM      Component Value Range Comment   ABO/RH(D) A POS  Antibody Screen NEG      Sample Expiration 07/27/2011     ABO/RH     Status: Normal   Collection Time   07/24/11  7:45 PM      Component Value Range Comment   ABO/RH(D) A POS     GLUCOSE, CAPILLARY     Status: Abnormal   Collection Time   07/24/11 10:26 PM      Component Value Range Comment   Glucose-Capillary 147 (*) 70 - 99 (mg/dL)    Comment 1 Notify RN      Comment 2 Documented in Chart      No results found.  ROS:  As stated above in the HPI, otherwise negative.  General appearance: alert and no distress Resp: clear to auscultation bilaterally GI: soft, non-tender; bowel sounds normal; no masses,  no organomegaly Extremities: extremities normal, atraumatic, no cyanosis or edema Blood pressure 114/80, pulse 111, temperature 99.3 F (37.4 C), temperature source Oral, resp. rate 20, height 5\' 9"  (1.753 m), weight 100.699 kg (222 lb), SpO2 100.00%.   Assessment/Plan 1) Hematochezia 2) Elevated WBC 3) Orthostatic hypotension   The patient was orthostatic.  My suspicion is that he is having a diverticular bleed, but I  do not have his prior colonoscopy records at this time.  Regardless, it has been 4 years since his last colonoscopy and a repeat examination is warranted at this time.  He does have an elevated WBC and Dr. Loreta Ave did mention that he had LLQ pain in the office, but I am not able to confirm this finding.  There was no pain palpated during this examination, but I will proceed with caution with regards to the colonoscopy.  His WBC may be elevated secondary to his use of hydrocortisone.  Plan: 1) Follow HGB. 2) Transfuse if necessary. 3) IV hydration. 4) Check differential with CBC in AM. 5) Review the prior colonoscopy report.  Fitzgerald Dunne D 07/24/2011, 11:10 PM

## 2011-07-24 NOTE — ED Notes (Signed)
Report called to [facility] RN  4w

## 2011-07-24 NOTE — ED Notes (Signed)
Attempted to give report to the floor reports that Rn will call back

## 2011-07-24 NOTE — ED Notes (Signed)
Pt has not arrived to room 3 yet

## 2011-07-25 ENCOUNTER — Encounter (HOSPITAL_COMMUNITY): Payer: Self-pay | Admitting: *Deleted

## 2011-07-25 ENCOUNTER — Encounter (HOSPITAL_COMMUNITY): Admission: RE | Payer: Self-pay | Source: Ambulatory Visit

## 2011-07-25 ENCOUNTER — Encounter (HOSPITAL_COMMUNITY): Admission: EM | Disposition: A | Discharge status: Home / Self Care | Payer: Self-pay | Source: Home / Self Care

## 2011-07-25 ENCOUNTER — Ambulatory Visit (HOSPITAL_COMMUNITY): Admission: RE | Admit: 2011-07-25 | Payer: Medicare Other | Source: Ambulatory Visit | Admitting: Gastroenterology

## 2011-07-25 HISTORY — PX: COLONOSCOPY: SHX5424

## 2011-07-25 LAB — CBC
MCH: 31.3 pg (ref 26.0–34.0)
MCH: 31.4 pg (ref 26.0–34.0)
MCHC: 34.7 g/dL (ref 30.0–36.0)
MCHC: 34.7 g/dL (ref 30.0–36.0)
MCV: 90.3 fL (ref 78.0–100.0)
Platelets: 149 10*3/uL — ABNORMAL LOW (ref 150–400)
Platelets: 174 10*3/uL (ref 150–400)
Platelets: 186 10*3/uL (ref 150–400)
RDW: 14.7 % (ref 11.5–15.5)
RDW: 14.9 % (ref 11.5–15.5)
RDW: 15 % (ref 11.5–15.5)
WBC: 18.5 10*3/uL — ABNORMAL HIGH (ref 4.0–10.5)
WBC: 17.1 10*3/uL — ABNORMAL HIGH (ref 4.0–10.5)

## 2011-07-25 LAB — BASIC METABOLIC PANEL
BUN: 26 mg/dL — ABNORMAL HIGH (ref 6–23)
CO2: 23 mEq/L (ref 19–32)
Chloride: 100 mEq/L (ref 96–112)
Creatinine, Ser: 1.41 mg/dL — ABNORMAL HIGH (ref 0.50–1.35)
GFR calc Af Amer: 58 mL/min — ABNORMAL LOW (ref >90)

## 2011-07-25 SURGERY — COLONOSCOPY
Anesthesia: Moderate Sedation

## 2011-07-25 MED ORDER — MIDAZOLAM HCL 5 MG/5ML IJ SOLN
INTRAMUSCULAR | Status: DC | PRN
  Administered 2011-07-25: 2 mg via INTRAVENOUS
  Administered 2011-07-25: 1 mg via INTRAVENOUS
  Administered 2011-07-25: 2 mg via INTRAVENOUS
  Administered 2011-07-25: 1 mg via INTRAVENOUS

## 2011-07-25 MED ORDER — SODIUM CHLORIDE 0.9 % IV SOLN
INTRAVENOUS | Status: DC
  Administered 2011-07-25 – 2011-07-26 (×2): via INTRAVENOUS

## 2011-07-25 MED ORDER — SODIUM CHLORIDE 0.9 % IV SOLN
Freq: Once | INTRAVENOUS | Status: AC
  Administered 2011-07-25: 500 mL via INTRAVENOUS

## 2011-07-25 MED ORDER — FENTANYL NICU IV SYRINGE 50 MCG/ML
INJECTION | INTRAMUSCULAR | Status: DC | PRN
  Administered 2011-07-25 (×2): 25 ug via INTRAVENOUS

## 2011-07-25 MED ORDER — ACETAMINOPHEN 325 MG PO TABS
650.0000 mg | ORAL_TABLET | ORAL | Status: DC | PRN
  Administered 2011-07-25: 650 mg via ORAL

## 2011-07-25 MED ORDER — HYDROCORTISONE 20 MG PO TABS
20.0000 mg | ORAL_TABLET | Freq: Every day | ORAL | Status: DC
  Administered 2011-07-25 – 2011-07-26 (×2): 20 mg via ORAL
  Filled 2011-07-25 (×3): qty 1

## 2011-07-25 MED ORDER — KETOCONAZOLE 200 MG PO TABS
200.0000 mg | ORAL_TABLET | Freq: Three times a day (TID) | ORAL | Status: DC
  Administered 2011-07-25 – 2011-07-26 (×5): 200 mg via ORAL
  Filled 2011-07-25 (×9): qty 1

## 2011-07-25 NOTE — Op Note (Signed)
[facility] [address] [place], [place]  [address]  OPERATIVE PROCEDURE REPORT  PATIENT:  Alec Harris, Alec Harris  MR#:  [id number] BIRTHDATE:  [birth year]  GENDER:  male ENDOSCOPIST:  Jeani Hawking, MD PROCEDURE DATE:  07/25/2011 PROCEDURE:  Colonoscopy [phone] ASA CLASS:  Class II INDICATIONS:  Hematochezia MEDICATIONS:  Fentanyl 50 mcg IV, Versed 6 mg IV  DESCRIPTION OF PROCEDURE:   After the risks benefits and alternatives of the procedure were thoroughly explained, informed consent was obtained.  Digital rectal exam was performed and revealed no abnormalities.   The  endoscope was introduced through the anus and advanced to the terminal ileum which was intubated for a short distance, without limitations.  The quality of the prep was good..  The instrument was then slowly withdrawn as the colon was fully examined. <<PROCEDUREIMAGES>>  FINDINGS:  A few diverticula were found in the ascending colon and the distal colon. No evidence of any active bleeding. No evidence of any inflammation, ulcerations, erosions, polyps, masses, or vascular abnormalities.   Retroflexed views in the rectum revealed internal and external hemorrhoids.    The scope was then withdrawn from the patient and the procedure terminated.  COMPLICATIONS:  None  IMPRESSION:  1) Ascending colon and left-sided iverticula 2) Internal and external hemorrhoids RECOMMENDATIONS:  1) Follow HGB. 2) Advance diet. 3) Transfuse if necessary.  ______________________________ Jeani Hawking, MD  CPT CODES:  [phone]  DIAGNOSIS CODES:  562.10, 455., 569.3  n. eSIGNEDJeani Hawking at 07/25/2011 11:03 AM  [place], [place], [phone]

## 2011-07-25 NOTE — Progress Notes (Addendum)
Pt has mostly liquid and blood stools. He drank 3/4 of Golytely.

## 2011-07-25 NOTE — ED Provider Notes (Signed)
Medical screening examination/treatment/procedure(s) were performed by non-physician practitioner and as supervising physician I was immediately available for consultation/collaboration.  Suad Autrey, MD 07/25/11 0153 

## 2011-07-26 DIAGNOSIS — K5731 Diverticulosis of large intestine without perforation or abscess with bleeding: Secondary | ICD-10-CM

## 2011-07-26 LAB — CBC
HCT: 26.6 % — ABNORMAL LOW (ref 39.0–52.0)
Hemoglobin: 8.3 g/dL — ABNORMAL LOW (ref 13.0–17.0)
MCH: 31.3 pg (ref 26.0–34.0)
MCHC: 35 g/dL (ref 30.0–36.0)
Platelets: 159 10*3/uL (ref 150–400)
Platelets: 141 10*3/uL — ABNORMAL LOW (ref 150–400)
RBC: 2.67 MIL/uL — ABNORMAL LOW (ref 4.22–5.81)
RBC: 2.94 MIL/uL — ABNORMAL LOW (ref 4.22–5.81)
RDW: 15.2 % (ref 11.5–15.5)
RDW: 15.4 % (ref 11.5–15.5)
WBC: 13.6 10*3/uL — ABNORMAL HIGH (ref 4.0–10.5)
WBC: 14.3 10*3/uL — ABNORMAL HIGH (ref 4.0–10.5)

## 2011-07-26 NOTE — Progress Notes (Signed)
     [facility] Daily Rounding Note 07/26/2011, 12:03 PM  SUBJECTIVE: No bleeding pr or stools since before colonoscopy.  Not dizzy getting up to bathroom.   OBJECTIVE: Looks well.  NAD  Vital signs in last 24 hours: Temp:  [98 F (36.7 C)-99.1 F (37.3 C)] 98.7 F (37.1 C) (12/08 0547) Pulse Rate:  [64-85] 72  (12/08 0547) Resp:  [14-18] 18  (12/08 0547) BP: (81-116)/(40-68) 101/61 mmHg (12/08 0547) SpO2:  [96 %-100 %] 96 % (12/08 0547) Last BM Date: 07/25/11  Heart: RRR Chest: Clear Abdomen: soft, nt, nd.  BS active  Extremities: no edema  Neuro/Psych:  Alert, no confusion, no tremor.  Intake/Output from previous day: 12/07 0701 - 12/08 0700 In: 2340 [P.O.:240; I.V.:1400; Blood:700] Out: -   Intake/Output this shift:    Lab Results:  Basename 07/26/11 0637 07/25/11 1302 07/25/11 0740  WBC 13.6* 17.1* 18.5*  HGB 8.3* 6.1* 7.0*  HCT 24.0* 17.6* 20.2*  PLT 144* 149* 174   BMET  Basename 07/25/11 0020  NA 132*  K 4.6  CL 100  CO2 23  GLUCOSE 163*  BUN 26*  CREATININE 1.41*  CALCIUM 8.5   LFT No results found for this basename: PROT,ALBUMIN,AST,ALT,ALKPHOS,BILITOT,BILIDIR,IBILI in the last 72 hours PT/INR  Basename 07/24/11 1945  LABPROT 13.5  INR 1.01    ASSESMENT: 1.  Hematochezia from diverticular bleed. 2.  Anemia, ABL anemia though not clear what is baseline Hgb so may have element of chronic anemia.  S/P 2 units prbc.  PLAN: 1.  D/C IV fluid, currently at 125 per hour. 2.  CBC in AM.  Alec Harris

## 2011-07-26 NOTE — Progress Notes (Signed)
Patient seen and I agree with the above documentation, including the assessment and plan. Will trend CBC one more day. No further bleeding at present. Felt to be diverticular

## 2011-07-27 DIAGNOSIS — K5731 Diverticulosis of large intestine without perforation or abscess with bleeding: Secondary | ICD-10-CM

## 2011-07-27 NOTE — Progress Notes (Signed)
      Gi Daily Rounding Note 07/27/2011, 8:20 AM  SUBJECTIVE: No stool, no bleeding per rectum.  Feels well.  No complaints.  Not dizzy.  OBJECTIVE: Looks well. Vital signs in last 24 hours: Temp:  [97.4 F (36.3 C)-97.9 F (36.6 C)] 97.4 F (36.3 C) (12/09 0643) Pulse Rate:  [59-71] 71  (12/09 0643) Resp:  [18-20] 18  (12/09 0643) BP: (99-136)/(56-70) 124/56 mmHg (12/09 0643) SpO2:  [96 %-98 %] 96 % (12/09 0643) Last BM Date: 07/25/11  Heart: RRR Chest: Clear B. Not SOB Abdomen: Soft, NT, ND, Active BS.  Extremities: no edema Neuro/Psych:  Pleasant, cooperative.  Not confused  Intake/Output from previous day: 12/08 0701 - 12/09 0700 In: 720 [P.O.:720] Out: -   Lab Results:  Basename 07/26/11 1600 07/26/11 1340 07/26/11 0637  WBC 14.3* 13.6* 13.6*  HGB 9.1* 9.2* 8.3*  HCT 26.6* 26.7* 24.0*  PLT 141* 159 144*   BMET  Basename 07/25/11 0020  NA 132*  K 4.6  CL 100  CO2 23  GLUCOSE 163*  BUN 26*  CREATININE 1.41*  CALCIUM 8.5   PT/INR  Basename 07/24/11 1945  LABPROT 13.5  INR 1.01   ASSESMENT: 1. Hematochezia from diverticular bleed.  2. Anemia, ABL anemia though not clear what is baseline Hgb so may have element of chronic anemia. S/P 2 units prbc. 3.  Hyperglycemia.  ? New onset D.M.  Did not have follow up serum glucose but initially was 163 and a single cbg was 147.  Will need to follow up with urgent care where he goes for primary care  PLAN: 1.  D/c home.  Dr. Elnoria Howard can contact pt with ROV appointment.   2.  Pt will need to go to Urgent Care for glucose follow up.   LOS: 3 days   Jennye Moccasin  07/27/2011, 8:20 AM Pager: [phone]

## 2011-07-27 NOTE — Progress Notes (Signed)
I agree with the above documentation, including the assessment and plan. No further bleeding with stable HCT Pt asymptomatic. Will followup with Dr. Elnoria Howard, his primary GI MD Pt instructed to return to ED for an recurrent bleeding, presyncopal symptoms, weakness, etc.  Voices understanding.

## 2011-07-28 ENCOUNTER — Encounter (HOSPITAL_COMMUNITY): Payer: Self-pay | Admitting: Gastroenterology

## 2011-07-28 ENCOUNTER — Other Ambulatory Visit: Payer: Self-pay | Admitting: Gastroenterology

## 2011-07-28 NOTE — Discharge Summary (Signed)
  Physician Discharge Summary  Patient ID: Alec Harris MRN: [id number] DOB/AGE: February 24, 1946 65 y.o.  Admit date: 07/24/2011 Discharge date: 07/28/2011  Admission Diagnoses: Diverticular bleed  Discharge Diagnoses: Diverticular bleed Active Problems:  * No active hospital problems. *    Discharged Condition: good  Hospital Course: The patient was admitted to the hospital and prepped for a colonoscopy.  He was also treated aggressively with IV hydration as he was orthostatic at the time of admission.  With the prep his bleeding did finally abate, however, his HGB was noted to have dropped into the 6 range.  He was transfused with 2 units of PRBC.  The colonoscopy revealed diverticula in the ascending colon and a few in the distal colon.  No active bleeding was identified.  He remained stable over the hospitalization and then he was discharged home.  No further bleeding was identified.  Consults: none  Significant Diagnostic Studies: endoscopy: colonoscopy: Findings of diverticula.  See Hospital Course section.  Treatments: Blood transfusion x 2 units.  Discharge Exam: Blood pressure 124/56, pulse 71, temperature 97.4 F (36.3 C), temperature source Oral, resp. rate 18, height 5\' 9"  (1.753 m), weight 100.699 kg (222 lb), SpO2 96.00%. General appearance: alert and no distress Resp: clear to auscultation bilaterally Cardio: regular rate and rhythm, S1, S2 normal, no murmur, click, rub or gallop GI: soft, non-tender; bowel sounds normal; no masses,  no organomegaly Extremities: extremities normal, atraumatic, no cyanosis or edema  Disposition: Home or Self Care  Discharge Orders    Future Orders Please Complete By Expires   Diet Carb Modified      Increase activity slowly        Discharge Medication List as of 07/27/2011  9:11 AM    CONTINUE these medications which have NOT CHANGED   Details  aspirin 81 MG chewable tablet Chew 81 mg by mouth daily.  , Until Discontinued,  Historical Med    calcium carbonate (OS-CAL - DOSED IN MG OF ELEMENTAL CALCIUM) 1250 MG tablet Take 1 tablet by mouth daily.  , Until Discontinued, Historical Med    hydrocortisone (CORTEF) 20 MG tablet Take 20 mg by mouth daily.  , Until Discontinued, Historical Med    ketoconazole (NIZORAL) 200 MG tablet Take 200 mg by mouth 3 (three) times daily.  , Until Discontinued, Historical Med       Follow-up Information    Follow up with Leiana Rund D. Call in 1 week. (call office if they do not call you to determine when and if they want to see you in the office..  Call the office if you have recurrent intestinal bleeding)    Contact information:   [address], [address] [place] [address] [phone]       Follow up with urgent care. (call them to make appointment specifically to  have  your blood sugars checked for diabetes. )          Signed: Ritchie Klee D 07/28/2011, 5:58 PM

## 2011-09-29 DIAGNOSIS — C61 Malignant neoplasm of prostate: Secondary | ICD-10-CM | POA: Diagnosis not present

## 2011-10-06 DIAGNOSIS — C61 Malignant neoplasm of prostate: Secondary | ICD-10-CM | POA: Diagnosis not present

## 2011-12-24 DIAGNOSIS — I739 Peripheral vascular disease, unspecified: Secondary | ICD-10-CM | POA: Diagnosis not present

## 2011-12-24 DIAGNOSIS — Z79899 Other long term (current) drug therapy: Secondary | ICD-10-CM | POA: Diagnosis not present

## 2011-12-24 DIAGNOSIS — Z13220 Encounter for screening for lipoid disorders: Secondary | ICD-10-CM | POA: Diagnosis not present

## 2011-12-24 DIAGNOSIS — M79609 Pain in unspecified limb: Secondary | ICD-10-CM | POA: Diagnosis not present

## 2012-01-09 DIAGNOSIS — R0989 Other specified symptoms and signs involving the circulatory and respiratory systems: Secondary | ICD-10-CM | POA: Diagnosis not present

## 2012-01-09 DIAGNOSIS — R0602 Shortness of breath: Secondary | ICD-10-CM | POA: Diagnosis not present

## 2012-01-09 DIAGNOSIS — F17200 Nicotine dependence, unspecified, uncomplicated: Secondary | ICD-10-CM | POA: Diagnosis not present

## 2012-01-09 DIAGNOSIS — I739 Peripheral vascular disease, unspecified: Secondary | ICD-10-CM | POA: Diagnosis not present

## 2012-01-14 DIAGNOSIS — I10 Essential (primary) hypertension: Secondary | ICD-10-CM | POA: Diagnosis not present

## 2012-01-14 DIAGNOSIS — E782 Mixed hyperlipidemia: Secondary | ICD-10-CM | POA: Diagnosis not present

## 2012-01-14 DIAGNOSIS — I739 Peripheral vascular disease, unspecified: Secondary | ICD-10-CM | POA: Diagnosis not present

## 2012-01-14 DIAGNOSIS — R0609 Other forms of dyspnea: Secondary | ICD-10-CM | POA: Diagnosis not present

## 2012-01-14 HISTORY — PX: OTHER SURGICAL HISTORY: SHX169

## 2012-01-17 DIAGNOSIS — I739 Peripheral vascular disease, unspecified: Secondary | ICD-10-CM

## 2012-01-17 HISTORY — DX: Peripheral vascular disease, unspecified: I73.9

## 2012-02-02 DIAGNOSIS — C61 Malignant neoplasm of prostate: Secondary | ICD-10-CM | POA: Diagnosis not present

## 2012-02-06 DIAGNOSIS — R0989 Other specified symptoms and signs involving the circulatory and respiratory systems: Secondary | ICD-10-CM | POA: Diagnosis not present

## 2012-02-06 DIAGNOSIS — I70219 Atherosclerosis of native arteries of extremities with intermittent claudication, unspecified extremity: Secondary | ICD-10-CM | POA: Diagnosis not present

## 2012-02-06 DIAGNOSIS — I739 Peripheral vascular disease, unspecified: Secondary | ICD-10-CM

## 2012-02-06 HISTORY — DX: Peripheral vascular disease, unspecified: I73.9

## 2012-02-09 DIAGNOSIS — C61 Malignant neoplasm of prostate: Secondary | ICD-10-CM | POA: Diagnosis not present

## 2012-02-12 DIAGNOSIS — I6529 Occlusion and stenosis of unspecified carotid artery: Secondary | ICD-10-CM | POA: Diagnosis not present

## 2012-02-12 DIAGNOSIS — E782 Mixed hyperlipidemia: Secondary | ICD-10-CM | POA: Diagnosis not present

## 2012-02-12 DIAGNOSIS — I739 Peripheral vascular disease, unspecified: Secondary | ICD-10-CM | POA: Diagnosis not present

## 2012-02-24 ENCOUNTER — Encounter (HOSPITAL_COMMUNITY): Payer: Self-pay | Admitting: Pharmacy Technician

## 2012-02-24 DIAGNOSIS — R5381 Other malaise: Secondary | ICD-10-CM | POA: Diagnosis not present

## 2012-02-24 DIAGNOSIS — R6889 Other general symptoms and signs: Secondary | ICD-10-CM | POA: Diagnosis not present

## 2012-02-24 DIAGNOSIS — Z01818 Encounter for other preprocedural examination: Secondary | ICD-10-CM | POA: Diagnosis not present

## 2012-02-24 DIAGNOSIS — D689 Coagulation defect, unspecified: Secondary | ICD-10-CM | POA: Diagnosis not present

## 2012-02-25 ENCOUNTER — Other Ambulatory Visit: Payer: Self-pay | Admitting: Cardiovascular Disease

## 2012-02-27 DIAGNOSIS — R7989 Other specified abnormal findings of blood chemistry: Secondary | ICD-10-CM | POA: Diagnosis not present

## 2012-03-02 ENCOUNTER — Encounter (HOSPITAL_COMMUNITY)
Admission: RE | Disposition: A | Discharge status: Home / Self Care | Payer: Self-pay | Source: Ambulatory Visit | Attending: Cardiovascular Disease

## 2012-03-02 ENCOUNTER — Ambulatory Visit (HOSPITAL_COMMUNITY)
Admission: RE | Admit: 2012-03-02 | Discharge: 2012-03-02 | Disposition: A | Discharge status: Home / Self Care | Payer: Medicare Other | Source: Ambulatory Visit | Attending: Cardiovascular Disease | Admitting: Cardiovascular Disease

## 2012-03-02 ENCOUNTER — Encounter (HOSPITAL_COMMUNITY): Payer: Self-pay | Admitting: Cardiology

## 2012-03-02 DIAGNOSIS — I7092 Chronic total occlusion of artery of the extremities: Secondary | ICD-10-CM | POA: Diagnosis not present

## 2012-03-02 DIAGNOSIS — I70219 Atherosclerosis of native arteries of extremities with intermittent claudication, unspecified extremity: Secondary | ICD-10-CM | POA: Diagnosis not present

## 2012-03-02 DIAGNOSIS — E785 Hyperlipidemia, unspecified: Secondary | ICD-10-CM | POA: Diagnosis not present

## 2012-03-02 DIAGNOSIS — R0609 Other forms of dyspnea: Secondary | ICD-10-CM | POA: Insufficient documentation

## 2012-03-02 DIAGNOSIS — I10 Essential (primary) hypertension: Secondary | ICD-10-CM | POA: Insufficient documentation

## 2012-03-02 DIAGNOSIS — I6529 Occlusion and stenosis of unspecified carotid artery: Secondary | ICD-10-CM | POA: Diagnosis not present

## 2012-03-02 DIAGNOSIS — R0989 Other specified symptoms and signs involving the circulatory and respiratory systems: Secondary | ICD-10-CM | POA: Insufficient documentation

## 2012-03-02 DIAGNOSIS — I708 Atherosclerosis of other arteries: Secondary | ICD-10-CM | POA: Insufficient documentation

## 2012-03-02 HISTORY — PX: CAROTID ANGIOGRAM: SHX5765

## 2012-03-02 HISTORY — PX: CAROTID ANGIOGRAM: SHX5504

## 2012-03-02 HISTORY — PX: LOWER EXTREMITY ANGIOGRAM: SHX5508

## 2012-03-02 HISTORY — DX: Disorder of arteries and arterioles, unspecified: I77.9

## 2012-03-02 HISTORY — DX: Peripheral vascular disease, unspecified: I73.9

## 2012-03-02 HISTORY — PX: OTHER SURGICAL HISTORY: SHX169

## 2012-03-02 SURGERY — CAROTID ANGIOGRAM
Anesthesia: LOCAL

## 2012-03-02 MED ORDER — MORPHINE SULFATE 4 MG/ML IJ SOLN: 1.0000 mg | INTRAMUSCULAR | Status: DC | PRN

## 2012-03-02 MED ORDER — LIDOCAINE HCL (PF) 1 % IJ SOLN
INTRAMUSCULAR | Status: AC
  Filled 2012-03-02: qty 30

## 2012-03-02 MED ORDER — SODIUM CHLORIDE 0.9 % IJ SOLN: 3.0000 mL | INTRAMUSCULAR | Status: DC | PRN

## 2012-03-02 MED ORDER — ONDANSETRON HCL 4 MG/2ML IJ SOLN: 4.0000 mg | Freq: Four times a day (QID) | INTRAMUSCULAR | Status: DC | PRN

## 2012-03-02 MED ORDER — HEPARIN (PORCINE) IN NACL 2-0.9 UNIT/ML-% IJ SOLN
INTRAMUSCULAR | Status: AC
  Filled 2012-03-02: qty 1000

## 2012-03-02 MED ORDER — SODIUM CHLORIDE 0.9 % IV SOLN
INTRAVENOUS | Status: DC
  Administered 2012-03-02: 1000 mL via INTRAVENOUS

## 2012-03-02 MED ORDER — SODIUM CHLORIDE 0.9 % IV SOLN: INTRAVENOUS | Status: DC

## 2012-03-02 MED ORDER — ASPIRIN EC 325 MG PO TBEC: 325.0000 mg | DELAYED_RELEASE_TABLET | Freq: Every day | ORAL | Status: DC

## 2012-03-02 MED ORDER — MIDAZOLAM HCL 2 MG/2ML IJ SOLN
INTRAMUSCULAR | Status: AC
  Filled 2012-03-02: qty 2

## 2012-03-02 MED ORDER — ACETAMINOPHEN 325 MG PO TABS: 650.0000 mg | ORAL_TABLET | ORAL | Status: DC | PRN

## 2012-03-02 MED ORDER — FENTANYL CITRATE 0.05 MG/ML IJ SOLN
INTRAMUSCULAR | Status: AC
  Filled 2012-03-02: qty 2

## 2012-03-02 NOTE — Op Note (Signed)
Alec Harris is a 66 y.o. male    [id number] LOCATION:  FACILITY: [facility]  PHYSICIAN: Nanetta Batty, M.D. 04-Oct-1945   DATE OF PROCEDURE:  03/02/2012  DATE OF DISCHARGE:  [facility]  PV Angio    History obtained from chart review. [name] is a 66 year old African American male patient of Dr. Italy Hilty's is referred for abdominal aortography with bifemoral runoff because of claudication.   PROCEDURE DESCRIPTION:    The patient was brought to the second floor  [facility] lab in the postabsorptive state. He was  premedicated with Valium 5mg  by mouth. His right groin was prepped and shaved in usual sterile fashion. Xylocaine 1% was used  for local anesthesia. A 5 French sheath was inserted into the right common femoral  artery using standard Seldinger technique.a 5 French pigtail catheter was used for abdominal aortography. A 5 French crossover catheter and a normal catheter was used to obtain contralateral access and performed left lower extremity angiography using bolus chase digital subtraction step table technique. Right lower cineangiography was performed using injection through the SideArm sheath. Visipaque dye was used for the entirety of the case. Retrograde aorta pressures monitored during the case.   HEMODYNAMICS:    AO SYSTOLIC/AO DIASTOLIC: 140/63    ANGIOGRAPHIC RESULTS:   1: Abdominal aortogram-renal arteries widely patent. The infra-renal abdominal aorta had moderate atherosclerotic changes.  2: Left lower extremity-80% eccentric calcified distal left common iliac artery stenosis. There was a 80% calcified left common femoral artery stenosis. The left SFA was occluded just after its origin, reconstituting in Hunter's canal by profunda collaterals. There is two-vessel runoff below the knee.  3: Right lower extremity-80% calcified right external iliac artery stenosis. Total SFA just beyond its origin with reconstitution in  Hunter's canal by profunda femoris collaterals. There was three-vessel runoff below the knee.   IMPRESSION:high-grade bilateral calcified iliac disease requiring orbital arthrectomy, PT and stenting. The patient will most likely require staged femoropopliteal bypass grafting if he does not have significant improvement in his claudication as a result of percutaneous intervention. The sheath was removed and pressure was held on the groin to achieve hemostasis. The patient left on stable condition. He'll be hydrated for several hours and be discharged home. I will see him in the office in one to 2 weeks for followup.  [name] MD, [facility] 03/02/2012 10:54 AM

## 2012-03-02 NOTE — Op Note (Addendum)
Alec Harris is a 66 y.o. male    [id number] LOCATION:  FACILITY: [facility]  PHYSICIAN: Nanetta Batty, M.D. [birth year]   DATE OF PROCEDURE:  03/02/2012  DATE OF DISCHARGE:  [facility]  PV Angio    History obtained from chart review. Mr. [name] is a 66 year old African American gentleman patient of Dr. Italy penalties referred for cerebral angiography because of abnormal Doppler studies. He had a negative Myoview stress test. Other problems include hyperlipidemia and claudication.  Operators: Dr. Nanetta Batty, Dr. Bryan Lemma   PROCEDURE DESCRIPTION:    The patient was brought to the second floor  [facility] lab in the postabsorptive state. He was  premedicated with Valium 5 mg by mouth. His right groin was prepped and shaved in usual sterile fashion. Xylocaine 1% was used  for local anesthesia. A 5 French sheath was inserted into the right common femoral  artery using standard Seldinger technique. A 5 inch long pigtail catheter was used for aortic arch angiography. A JB1 catheter was used for selective carotid angiography with both intra-and extracranial views. Visipaque dye was used for the entirety of the case. Retrograde aorta pressure was monitored in the case.   HEMODYNAMICS:    AO SYSTOLIC/AO DIASTOLIC: 140/63    ANGIOGRAPHIC RESULTS:  1: Arch aortogram -type II arch   2: Right carotid -90% focal eccentric proximal right internal carotid artery stenosis.   3: Left carotid-70% proximal left internal carotid artery stenosis. The left carotid was dominant and provided flow to both anterior cerebral arteries.    IMPRESSION:high-grade bilateral internal carotid artery stenosis, right greater than left, probably best treated with carotid artery stenting of the right with surveillance by Doppler of the left. The patient has had fusion of C3-4 back in 2000. This intervention were reviewed with Dr. Durene Cal , vascular surgeon, who  will see to evaluate the patient for suitability of endarterectomy versus stenting. Patient will be discharged home today as outpatient and will see me back in the office in one to 2 weeks.  [name] MD, [facility] 03/02/2012 10:31 AM    The Left External Iliac Artery was selected for the arteriogram!!!!!

## 2012-03-02 NOTE — H&P (Signed)
History and Physical Interval Note:  NAME:  Alec Harris   MRN: [id number] DOB:  [birth year]   ADMIT DATE: 03/02/2012   03/02/2012 9:07 AM  Alec Harris is a 66 y.o. male HTN, DOE, HLD who was referred to Dr. Rennis Golden for Claudication & DOE.  Myoview was negative, but LEA Dopplers suggest significant bilateral iliac & SFA disease.  Also Carotid disease. He is referred for invasive evaluation.   Past Medical History  Diagnosis Date  . Prostate cancer   . Rectal bleeding 07/24/11    Diverticular Bleeding  . PAD (peripheral artery disease) 12/2011  . Carotid artery disease     By doppler   Past Surgical History  Procedure Date  . Prostate surgery 2003    for  prostate cancer  . Knee cartilage surgery     surgery for a torn ligament  . Cervical spine surgery approx. 10 years ago    disc removed from neck  . Colonoscopy 07/25/2011    Procedure: COLONOSCOPY;  Surgeon: Theda Belfast;  Location: [facility];  Service: Endoscopy;  Laterality: N/A;    FAMHx: No family history on file.  SOCHx:  reports that he has been smoking.  He has never used smokeless tobacco. He reports that he drinks alcohol. He reports that he does not use illicit drugs.  ALLERGIES: No Known Allergies  HOME MEDICATIONS: Prescriptions prior to admission  Medication Sig Dispense Refill  . aspirin 81 MG chewable tablet Chew 81 mg by mouth daily.        . Calcium Carb-Cholecalciferol (CALCIUM 500 +D) 500-400 MG-UNIT TABS Take 1 tablet by mouth 2 (two) times daily.      . cilostazol (PLETAL) 100 MG tablet Take 100 mg by mouth daily.      . hydrocortisone (CORTEF) 20 MG tablet Take 20 mg by mouth daily.        [name] ketoconazole (NIZORAL) 200 MG tablet Take 200 mg by mouth 3 (three) times daily.        . rosuvastatin (CRESTOR) 10 MG tablet Take 10 mg by mouth daily.        PHYSICAL EXAM:Blood pressure 137/73, pulse 70, temperature 98.1 F (36.7 C), temperature source Oral, resp. rate 18, height 5\' 8"  (1.727 m), weight  107.502 kg (237 lb), SpO2 99.00%. General appearance: alert, cooperative, appears stated age and no distress Neck: no adenopathy, no JVD, supple, symmetrical, trachea midline, thyroid not enlarged, symmetric, no tenderness/mass/nodules and bilateral bruits Lungs: clear to auscultation bilaterally, normal percussion bilaterally and non-labored Heart: regular rate and rhythm, S1, S2 normal, no murmur, click, rub or gallop Abdomen: soft, non-tender; bowel sounds normal; no masses,  no organomegaly Extremities: extremities normal, atraumatic, no cyanosis or edema Pulses: diminished bilateral LE pulses Neurologic: Grossly normal  IMPRESSION & PLAN The patients' history has been reviewed, patient examined, no change in status from most recent note, stable for surgery. I have reviewed the patients' chart and labs. Questions were answered to the patient's satisfaction.    Alec Harris has presented today for surgery, with the diagnosis of chest pain The various methods of treatment have been discussed with the patient and family.   Risks / Complications include, but not limited to: Death, MI, CVA/TIA, VF/VT (with defibrillation), Bradycardia (need for temporary pacer placement), contrast induced nephropathy, bleeding / bruising / hematoma / pseudoaneurysm, vascular or coronary injury (with possible emergent CT or Vascular Surgery), adverse medication reactions, infection.     After consideration of risks, benefits and other options for  treatment, the patient has consented to Procedure(s):  ABDOMINAL AORTIC ANGIOGRAPHY AORTIC ARCH ANGIOGRAPHY BILATERAL CAROTIC ARTERY ANGIOGRAPHY PERIPHERAL VASCULAR ANGIOGRAPHY +/- PERIPHERAL VASCULAR INTERVENTION (ATHERECTOMY, PTA, STENT)  as a surgical intervention.   We will proceed with the planned procedure.   [name] [facility] [address] [place], [place]  [address]  [phone]  03/02/2012 9:07  AM

## 2012-03-02 NOTE — Consult Note (Signed)
I have reviewed the patient's carotid angiogram.  His lesion is focal and greater than 80% on the right.  It is also high, at the level of the angle of the mandible and a lilttle higher.  With his neck fusion, surgical access will be somewhat challenging.  He would be a  Good candidate for carotid stenting.  Alec Harris

## 2012-03-02 NOTE — H&P (Signed)
  H & P will be scanned in.  Pt was reexamined and existing H & P reviewed. No changes found.  [name], MD [facility] 03/02/2012 9:18 AM

## 2012-03-19 DIAGNOSIS — I739 Peripheral vascular disease, unspecified: Secondary | ICD-10-CM | POA: Diagnosis not present

## 2012-03-19 DIAGNOSIS — E782 Mixed hyperlipidemia: Secondary | ICD-10-CM | POA: Diagnosis not present

## 2012-03-24 DIAGNOSIS — I70219 Atherosclerosis of native arteries of extremities with intermittent claudication, unspecified extremity: Secondary | ICD-10-CM | POA: Diagnosis not present

## 2012-03-24 DIAGNOSIS — I739 Peripheral vascular disease, unspecified: Secondary | ICD-10-CM | POA: Diagnosis not present

## 2012-03-24 HISTORY — PX: DOPPLER ECHOCARDIOGRAPHY: SHX263

## 2012-03-26 ENCOUNTER — Encounter: Payer: Self-pay | Admitting: Surgery

## 2012-03-29 ENCOUNTER — Encounter: Payer: Self-pay | Admitting: Surgery

## 2012-03-29 ENCOUNTER — Ambulatory Visit (INDEPENDENT_AMBULATORY_CARE_PROVIDER_SITE_OTHER): Payer: Medicare Other | Admitting: Surgery

## 2012-03-29 VITALS — BP 155/64 | HR 89 | Resp 16 | Ht 69.0 in | Wt 237.0 lb

## 2012-03-29 DIAGNOSIS — I6529 Occlusion and stenosis of unspecified carotid artery: Secondary | ICD-10-CM | POA: Diagnosis not present

## 2012-03-29 NOTE — Progress Notes (Signed)
[facility] [place]   Patient name: Alec Harris MRN: [id number] DOB: 03/07/1946 Sex: male   Referred by: Dr. Berry  Reason for referral:  Chief Complaint  Patient presents with  . Carotid    CEA vs stent/ Dr. Jonathan Berry    HISTORY OF PRESENT ILLNESS: The patient is referred today for evaluation of bilateral carotid stenosis, right greater than left, both of which are asymptomatic. He has recently undergone angiography which reveals a 95% right carotid stenosis 80% left carotid stenosis. The patient has a history of cervical fusion and is being considered for stenting versus endarterectomy of his carotid stenosis.  The patient suffers from peripheral vascular disease with bilateral claudication which has been alleviated with cilostazol. He does have coronary artery disease but has a low risk scan (Myoview) from May of 2013. He is a smoker.  Past Medical History  Diagnosis Date  . Prostate cancer   . Rectal bleeding 07/24/11    Diverticular Bleeding  . PAD (peripheral artery disease) 12/2011  . Carotid artery disease     By doppler    Past Surgical History  Procedure Date  . Knee cartilage surgery     surgery for a torn ligament  . Cervical spine surgery approx. 10 years ago    disc removed from neck  . Colonoscopy 07/25/2011    Procedure: COLONOSCOPY;  Surgeon: Patrick D Hung;  Location: [facility];  Service: Endoscopy;  Laterality: N/A;  . Prostate surgery 2003    for  prostate cancer    History   Social History  . Marital Status: Married    Spouse Name: N/A    Number of Children: N/A  . Years of Education: N/A   Occupational History  . Not on file.   Social History Main Topics  . Smoking status: Current Everyday Smoker    Types: Cigarettes, Cigars  . Smokeless tobacco: Never Used   Comment: pt states he smokes about 6 cigarettes per day  . Alcohol Use: 6.6 - 7.2 oz/week    10 Cans of beer, 1-2 Shots of liquor per week  . Drug Use:  No  . Sexually Active: Not on file   Other Topics Concern  . Not on file   Social History Narrative  . No narrative on file    Family History  Problem Relation Age of Onset  . Hypertension Mother   . Heart attack Mother   . Heart attack Father   . Cancer Brother     throat    Allergies as of 03/29/2012  . (No Known Allergies)    Current Outpatient Prescriptions on File Prior to Visit  Medication Sig Dispense Refill  . aspirin 81 MG chewable tablet Chew 81 mg by mouth daily.        . Calcium Carb-Cholecalciferol (CALCIUM 500 +D) 500-400 MG-UNIT TABS Take 1 tablet by mouth 2 (two) times daily.      . cilostazol (PLETAL) 100 MG tablet Take 100 mg by mouth daily.      . hydrocortisone (CORTEF) 20 MG tablet Take 20 mg by mouth daily.        . ketoconazole (NIZORAL) 200 MG tablet Take 200 mg by mouth 3 (three) times daily.        . rosuvastatin (CRESTOR) 10 MG tablet Take 10 mg by mouth daily.         REVIEW OF SYSTEMS: Cardiovascular: No chest pain, chest pressure, palpitations, orthopnea, or dyspnea on exertion  No history   of DVT or phlebitis. Pulmonary: No productive cough, asthma or wheezing. Neurologic: No weakness, paresthesias, aphasia, or amaurosis. No dizziness. Hematologic: No bleeding problems or clotting disorders. Musculoskeletal: No joint pain or joint swelling. Gastrointestinal: No blood in stool or hematemesis Genitourinary: No dysuria or hematuria. Psychiatric:: No history of major depression. Integumentary: No rashes or ulcers. Constitutional: No fever or chills.  PHYSICAL EXAMINATION: General: The patient appears their stated age.  Vital signs are BP 155/64  Pulse 89  Resp 16  Ht 5' 9" (1.753 m)  Wt 237 lb (107.502 kg)  BMI 35.00 kg/m2  SpO2 100% HEENT:  No gross abnormalities Pulmonary: Respirations are non-labored Musculoskeletal: There are no major deformities.   Neurologic: No focal weakness or paresthesias are detected, Skin: There are no  ulcer or rashes noted. Psychiatric: The patient has normal affect. Cardiovascular: There is a regular rate and rhythm without significant murmur appreciated. Bilateral carotid bruits  Diagnostic Studies: None in the office today  Outside Studies/Documentation Historical records were reviewed.  They showed high-grade right carotid stenosis, left carotid stenosis  Medication Changes: I will stop his Pletal 5 days prior to surgery  Assessment:  Asymptomatic bilateral carotid stenosis, right greater than left Plan: I have reviewed his angiogram and ultrasound findings which showed high-grade bilateral carotid stenosis, right greater than left. The patient is asymptomatic. I do not feel that his cervical instrumentation limits the range of motion in his neck. I therefore do not think that he is high risk surgically from his neck restriction. Therefore I feel that the best course of action is to proceed with right carotid endarterectomy and continued surveillance of his left carotid stenosis. I discussed the risks and benefits of endarterectomy which include the risk of stroke, the risk of nerve injury, the risk of bleeding, risk of cardiopulmonary complications, and the risk of recurrent disease. All of his questions were answered. I will stop his Pletal 5 days prior to his operation which is been scheduled for Thursday, August 22     V. Wells Alvester Eads IV, M.D. [facility] [place] Office: [phone] Pager:  [phone]   

## 2012-03-30 ENCOUNTER — Encounter: Payer: Self-pay | Admitting: *Deleted

## 2012-03-30 ENCOUNTER — Encounter (HOSPITAL_COMMUNITY): Payer: Self-pay | Admitting: Pharmacy Technician

## 2012-04-01 NOTE — Pre-Procedure Instructions (Signed)
20 Alec Harris  04/01/2012   Your procedure is scheduled on:  Thursday, August 22nd  Report to [facility] Short Stay Center at 0730 AM.  Call this number if you have problems the morning of surgery: [phone]   Remember:   Do not eat food or drink:After Midnight.  Take these medicines the morning of surgery with A SIP OF WATER: none   Do not wear jewelry, make-up or nail polish.  Do not wear lotions, powders, or perfumes.   Do not shave 48 hours prior to surgery. Men may shave face and neck.  Do not bring valuables to the hospital.  Contacts, dentures or bridgework may not be worn into surgery.  Leave suitcase in the car. After surgery it may be brought to your room.  For patients admitted to the hospital, checkout time is 11:00 AM the day of discharge.   Special Instructions: CHG Shower Use Special Wash: 1/2 bottle night before surgery and 1/2 bottle morning of surgery.   Please read over the following fact sheets that you were given: Pain Booklet, Coughing and Deep Breathing, Blood Transfusion Information, MRSA Information and Surgical Site Infection Prevention

## 2012-04-02 ENCOUNTER — Encounter (HOSPITAL_COMMUNITY): Payer: Self-pay

## 2012-04-02 ENCOUNTER — Encounter (HOSPITAL_COMMUNITY)
Admission: RE | Admit: 2012-04-02 | Discharge: 2012-04-02 | Disposition: A | Discharge status: Home / Self Care | Payer: Medicare Other | Source: Ambulatory Visit | Attending: Surgery | Admitting: Surgery

## 2012-04-02 DIAGNOSIS — I2510 Atherosclerotic heart disease of native coronary artery without angina pectoris: Secondary | ICD-10-CM | POA: Diagnosis not present

## 2012-04-02 DIAGNOSIS — Z01811 Encounter for preprocedural respiratory examination: Secondary | ICD-10-CM | POA: Diagnosis not present

## 2012-04-02 LAB — COMPREHENSIVE METABOLIC PANEL
Albumin: 3.4 g/dL — ABNORMAL LOW (ref 3.5–5.2)
BUN: 14 mg/dL (ref 6–23)
Chloride: 105 mEq/L (ref 96–112)
Creatinine, Ser: 1.13 mg/dL (ref 0.50–1.35)
GFR calc Af Amer: 75 mL/min — ABNORMAL LOW (ref >90)
Glucose, Bld: 143 mg/dL — ABNORMAL HIGH (ref 70–99)
Total Bilirubin: 0.2 mg/dL — ABNORMAL LOW (ref 0.3–1.2)

## 2012-04-02 LAB — URINALYSIS, ROUTINE W REFLEX MICROSCOPIC
Bilirubin Urine: NEGATIVE
Hgb urine dipstick: NEGATIVE
Ketones, ur: NEGATIVE mg/dL
Protein, ur: NEGATIVE mg/dL
Urobilinogen, UA: 0.2 mg/dL (ref 0.0–1.0)

## 2012-04-02 LAB — TYPE AND SCREEN
ABO/RH(D): A POS
Antibody Screen: NEGATIVE

## 2012-04-02 LAB — CBC
HCT: 40.2 % (ref 39.0–52.0)
Hemoglobin: 13.6 g/dL (ref 13.0–17.0)
MCHC: 33.8 g/dL (ref 30.0–36.0)
MCV: 90.7 fL (ref 78.0–100.0)
RDW: 14.8 % (ref 11.5–15.5)
WBC: 7.7 10*3/uL (ref 4.0–10.5)

## 2012-04-02 LAB — PROTIME-INR: INR: 0.9 (ref 0.00–1.49)

## 2012-04-02 LAB — SURGICAL PCR SCREEN: MRSA, PCR: INVALID — AB

## 2012-04-02 NOTE — Progress Notes (Signed)
PCR NASAL SWAB  RESULTS CAME BACK  'INCONCLUSIVE'. LAB STATED THIS SOMETIMES HAPPENS AND THEY HAVE TO SEND SPECIMEN OFF... RESULTS SHOULD BE BACK BY Monday....DA 

## 2012-04-05 LAB — MRSA CULTURE

## 2012-04-07 MED ORDER — DEXTROSE 5 % IV SOLN
1.5000 g | INTRAVENOUS | Status: AC
  Administered 2012-04-08: 1.5 g via INTRAVENOUS
  Filled 2012-04-07: qty 1.5

## 2012-04-08 ENCOUNTER — Inpatient Hospital Stay (HOSPITAL_COMMUNITY)
Admission: RE | Admit: 2012-04-08 | Discharge: 2012-04-09 | DRG: 039 | Disposition: A | Discharge status: Home / Self Care | Payer: Medicare Other | Source: Ambulatory Visit | Attending: Surgery | Admitting: Surgery

## 2012-04-08 ENCOUNTER — Telehealth: Payer: Self-pay | Admitting: Surgery

## 2012-04-08 ENCOUNTER — Encounter (HOSPITAL_COMMUNITY): Payer: Self-pay | Admitting: Certified Registered Nurse Anesthetist

## 2012-04-08 ENCOUNTER — Encounter (HOSPITAL_COMMUNITY): Payer: Self-pay | Admitting: *Deleted

## 2012-04-08 ENCOUNTER — Ambulatory Visit (HOSPITAL_COMMUNITY): Payer: Medicare Other | Admitting: Certified Registered Nurse Anesthetist

## 2012-04-08 ENCOUNTER — Encounter (HOSPITAL_COMMUNITY)
Admission: RE | Disposition: A | Discharge status: Home / Self Care | Payer: Self-pay | Source: Ambulatory Visit | Attending: Surgery

## 2012-04-08 DIAGNOSIS — I2510 Atherosclerotic heart disease of native coronary artery without angina pectoris: Secondary | ICD-10-CM | POA: Diagnosis present

## 2012-04-08 DIAGNOSIS — I658 Occlusion and stenosis of other precerebral arteries: Secondary | ICD-10-CM | POA: Diagnosis present

## 2012-04-08 DIAGNOSIS — I6529 Occlusion and stenosis of unspecified carotid artery: Secondary | ICD-10-CM

## 2012-04-08 DIAGNOSIS — Z8546 Personal history of malignant neoplasm of prostate: Secondary | ICD-10-CM

## 2012-04-08 DIAGNOSIS — I70219 Atherosclerosis of native arteries of extremities with intermittent claudication, unspecified extremity: Secondary | ICD-10-CM | POA: Diagnosis present

## 2012-04-08 DIAGNOSIS — F17200 Nicotine dependence, unspecified, uncomplicated: Secondary | ICD-10-CM | POA: Diagnosis present

## 2012-04-08 DIAGNOSIS — Z7982 Long term (current) use of aspirin: Secondary | ICD-10-CM

## 2012-04-08 HISTORY — PX: CAROTID ENDARTERECTOMY: SUR193

## 2012-04-08 HISTORY — PX: ENDARTERECTOMY: SHX5162

## 2012-04-08 SURGERY — ENDARTERECTOMY, CAROTID
Anesthesia: Monitor Anesthesia Care | Site: Neck | Laterality: Right | Wound class: Clean

## 2012-04-08 MED ORDER — ROCURONIUM BROMIDE 100 MG/10ML IV SOLN
INTRAVENOUS | Status: DC | PRN
  Administered 2012-04-08 (×2): 10 mg via INTRAVENOUS
  Administered 2012-04-08: 50 mg via INTRAVENOUS

## 2012-04-08 MED ORDER — ONDANSETRON HCL 4 MG/2ML IJ SOLN
INTRAMUSCULAR | Status: DC | PRN
  Administered 2012-04-08: 4 mg via INTRAVENOUS

## 2012-04-08 MED ORDER — ACETAMINOPHEN 325 MG PO TABS: 325.0000 mg | ORAL_TABLET | ORAL | Status: DC | PRN

## 2012-04-08 MED ORDER — OXYCODONE-ACETAMINOPHEN 5-325 MG PO TABS: 1.0000 | ORAL_TABLET | ORAL | Status: AC | PRN

## 2012-04-08 MED ORDER — NEOSTIGMINE METHYLSULFATE 1 MG/ML IJ SOLN
INTRAMUSCULAR | Status: DC | PRN
  Administered 2012-04-08: 4 mg via INTRAVENOUS

## 2012-04-08 MED ORDER — MORPHINE SULFATE 2 MG/ML IJ SOLN: 2.0000 mg | INTRAMUSCULAR | Status: DC | PRN

## 2012-04-08 MED ORDER — HYDROMORPHONE HCL PF 1 MG/ML IJ SOLN
0.2500 mg | INTRAMUSCULAR | Status: DC | PRN
  Administered 2012-04-08 (×2): 0.5 mg via INTRAVENOUS

## 2012-04-08 MED ORDER — LIDOCAINE HCL (CARDIAC) 20 MG/ML IV SOLN
INTRAVENOUS | Status: DC | PRN
  Administered 2012-04-08: 100 mg via INTRAVENOUS

## 2012-04-08 MED ORDER — KETOCONAZOLE 200 MG PO TABS
200.0000 mg | ORAL_TABLET | Freq: Three times a day (TID) | ORAL | Status: DC
  Administered 2012-04-08 – 2012-04-09 (×3): 200 mg via ORAL
  Filled 2012-04-08 (×5): qty 1

## 2012-04-08 MED ORDER — THROMBIN 20000 UNITS EX SOLR
CUTANEOUS | Status: AC
  Filled 2012-04-08: qty 20000

## 2012-04-08 MED ORDER — POTASSIUM CHLORIDE IN NACL 20-0.9 MEQ/L-% IV SOLN
INTRAVENOUS | Status: DC
  Administered 2012-04-08: 18:00:00 via INTRAVENOUS
  Filled 2012-04-08 (×4): qty 1000

## 2012-04-08 MED ORDER — SODIUM CHLORIDE 0.9 % IV SOLN: INTRAVENOUS | Status: DC

## 2012-04-08 MED ORDER — LIDOCAINE HCL (PF) 1 % IJ SOLN
INTRAMUSCULAR | Status: AC
  Filled 2012-04-08: qty 30

## 2012-04-08 MED ORDER — HYDROMORPHONE HCL PF 1 MG/ML IJ SOLN
INTRAMUSCULAR | Status: AC
  Filled 2012-04-08: qty 1

## 2012-04-08 MED ORDER — METOPROLOL TARTRATE 1 MG/ML IV SOLN: 2.0000 mg | INTRAVENOUS | Status: DC | PRN

## 2012-04-08 MED ORDER — HYDROCORTISONE 20 MG PO TABS
20.0000 mg | ORAL_TABLET | Freq: Every day | ORAL | Status: DC
  Administered 2012-04-08 – 2012-04-09 (×2): 20 mg via ORAL
  Filled 2012-04-08 (×2): qty 1

## 2012-04-08 MED ORDER — ONDANSETRON HCL 4 MG/2ML IJ SOLN: 4.0000 mg | Freq: Four times a day (QID) | INTRAMUSCULAR | Status: DC | PRN

## 2012-04-08 MED ORDER — PROPOFOL 10 MG/ML IV BOLUS
INTRAVENOUS | Status: DC | PRN
  Administered 2012-04-08: 150 mg via INTRAVENOUS

## 2012-04-08 MED ORDER — HEMOSTATIC AGENTS (NO CHARGE) OPTIME
TOPICAL | Status: DC | PRN
  Administered 2012-04-08: 1 via TOPICAL

## 2012-04-08 MED ORDER — GUAIFENESIN-DM 100-10 MG/5ML PO SYRP: 15.0000 mL | ORAL_SOLUTION | ORAL | Status: DC | PRN

## 2012-04-08 MED ORDER — SODIUM CHLORIDE 0.9 % IV SOLN: 500.0000 mL | Freq: Once | INTRAVENOUS | Status: AC | PRN

## 2012-04-08 MED ORDER — MIDAZOLAM HCL 5 MG/5ML IJ SOLN
INTRAMUSCULAR | Status: DC | PRN
  Administered 2012-04-08: 2 mg via INTRAVENOUS

## 2012-04-08 MED ORDER — ASPIRIN EC 325 MG PO TBEC
325.0000 mg | DELAYED_RELEASE_TABLET | Freq: Every day | ORAL | Status: DC
  Administered 2012-04-09: 325 mg via ORAL
  Filled 2012-04-08 (×2): qty 1

## 2012-04-08 MED ORDER — POTASSIUM CHLORIDE CRYS ER 20 MEQ PO TBCR: 20.0000 meq | EXTENDED_RELEASE_TABLET | Freq: Once | ORAL | Status: AC | PRN

## 2012-04-08 MED ORDER — 0.9 % SODIUM CHLORIDE (POUR BTL) OPTIME
TOPICAL | Status: DC | PRN
  Administered 2012-04-08 (×2): 1000 mL

## 2012-04-08 MED ORDER — DOCUSATE SODIUM 100 MG PO CAPS
100.0000 mg | ORAL_CAPSULE | Freq: Every day | ORAL | Status: DC
  Administered 2012-04-09: 100 mg via ORAL
  Filled 2012-04-08: qty 1

## 2012-04-08 MED ORDER — CILOSTAZOL 100 MG PO TABS
100.0000 mg | ORAL_TABLET | Freq: Every day | ORAL | Status: DC
  Filled 2012-04-08: qty 1

## 2012-04-08 MED ORDER — GLYCOPYRROLATE 0.2 MG/ML IJ SOLN
INTRAMUSCULAR | Status: DC | PRN
  Administered 2012-04-08: .6 mg via INTRAVENOUS

## 2012-04-08 MED ORDER — ONDANSETRON HCL 4 MG/2ML IJ SOLN: 4.0000 mg | Freq: Once | INTRAMUSCULAR | Status: DC | PRN

## 2012-04-08 MED ORDER — ACETAMINOPHEN 650 MG RE SUPP: 325.0000 mg | RECTAL | Status: DC | PRN

## 2012-04-08 MED ORDER — PROTAMINE SULFATE 10 MG/ML IV SOLN
INTRAVENOUS | Status: DC | PRN
  Administered 2012-04-08: 10 mg via INTRAVENOUS
  Administered 2012-04-08: 20 mg via INTRAVENOUS
  Administered 2012-04-08 (×2): 10 mg via INTRAVENOUS

## 2012-04-08 MED ORDER — HEPARIN SODIUM (PORCINE) 1000 UNIT/ML IJ SOLN
INTRAMUSCULAR | Status: DC | PRN
  Administered 2012-04-08: 8 mL via INTRAVENOUS
  Administered 2012-04-08: 2 mL via INTRAVENOUS

## 2012-04-08 MED ORDER — SODIUM CHLORIDE 0.9 % IV SOLN
10.0000 mg | INTRAVENOUS | Status: DC | PRN
  Administered 2012-04-08: 40 ug/min via INTRAVENOUS

## 2012-04-08 MED ORDER — FENTANYL CITRATE 0.05 MG/ML IJ SOLN
INTRAMUSCULAR | Status: DC | PRN
  Administered 2012-04-08: 150 ug via INTRAVENOUS

## 2012-04-08 MED ORDER — OXYCODONE-ACETAMINOPHEN 5-325 MG PO TABS
1.0000 | ORAL_TABLET | ORAL | Status: DC | PRN
  Administered 2012-04-08: 2 via ORAL
  Filled 2012-04-08: qty 2

## 2012-04-08 MED ORDER — DOPAMINE-DEXTROSE 3.2-5 MG/ML-% IV SOLN: 3.0000 ug/kg/min | INTRAVENOUS | Status: DC | PRN

## 2012-04-08 MED ORDER — LACTATED RINGERS IV SOLN
INTRAVENOUS | Status: DC | PRN
  Administered 2012-04-08 (×2): via INTRAVENOUS

## 2012-04-08 MED ORDER — PANTOPRAZOLE SODIUM 40 MG PO TBEC: 40.0000 mg | DELAYED_RELEASE_TABLET | Freq: Every day | ORAL | Status: DC

## 2012-04-08 MED ORDER — HYDRALAZINE HCL 20 MG/ML IJ SOLN: 10.0000 mg | INTRAMUSCULAR | Status: DC | PRN

## 2012-04-08 MED ORDER — DEXTROSE 5 % IV SOLN
1.5000 g | Freq: Two times a day (BID) | INTRAVENOUS | Status: AC
  Administered 2012-04-08 – 2012-04-09 (×2): 1.5 g via INTRAVENOUS
  Filled 2012-04-08 (×3): qty 1.5

## 2012-04-08 MED ORDER — PHENOL 1.4 % MT LIQD: 1.0000 | OROMUCOSAL | Status: DC | PRN

## 2012-04-08 MED ORDER — SODIUM CHLORIDE 0.9 % IR SOLN
Status: DC | PRN
  Administered 2012-04-08: 11:00:00

## 2012-04-08 MED ORDER — LABETALOL HCL 5 MG/ML IV SOLN: 10.0000 mg | INTRAVENOUS | Status: DC | PRN

## 2012-04-08 SURGICAL SUPPLY — 46 items
ADH SKN CLS APL DERMABOND .7 (GAUZE/BANDAGES/DRESSINGS) ×1
CANISTER SUCTION 2500CC (MISCELLANEOUS) ×2 IMPLANT
CATH ROBINSON RED A/P 18FR (CATHETERS) ×2 IMPLANT
CATH SUCT 10FR WHISTLE TIP (CATHETERS) ×2 IMPLANT
CLIP TI MEDIUM 24 (CLIP) ×2 IMPLANT
CLIP TI WIDE RED SMALL 24 (CLIP) ×2 IMPLANT
CLOTH BEACON ORANGE TIMEOUT ST (SAFETY) ×2 IMPLANT
COVER SURGICAL LIGHT HANDLE (MISCELLANEOUS) ×2 IMPLANT
CRADLE DONUT ADULT HEAD (MISCELLANEOUS) ×2 IMPLANT
DERMABOND ADVANCED (GAUZE/BANDAGES/DRESSINGS) ×1
DERMABOND ADVANCED .7 DNX12 (GAUZE/BANDAGES/DRESSINGS) ×1 IMPLANT
DRAIN CHANNEL 15F RND FF W/TCR (WOUND CARE) IMPLANT
DRAPE WARM FLUID 44X44 (DRAPE) ×2 IMPLANT
ELECT REM PT RETURN 9FT ADLT (ELECTROSURGICAL) ×2
ELECTRODE REM PT RTRN 9FT ADLT (ELECTROSURGICAL) ×1 IMPLANT
EVACUATOR SILICONE 100CC (DRAIN) IMPLANT
GLOVE BIOGEL PI IND STRL 6.5 (GLOVE) IMPLANT
GLOVE BIOGEL PI IND STRL 7.5 (GLOVE) ×1 IMPLANT
GLOVE BIOGEL PI INDICATOR 6.5 (GLOVE) ×2
GLOVE BIOGEL PI INDICATOR 7.5 (GLOVE) ×2
GLOVE SS N UNI LF 6.0 STRL (GLOVE) ×1 IMPLANT
GLOVE SURG SS PI 7.5 STRL IVOR (GLOVE) ×2 IMPLANT
GOWN PREVENTION PLUS XXLARGE (GOWN DISPOSABLE) ×2 IMPLANT
GOWN STRL NON-REIN LRG LVL3 (GOWN DISPOSABLE) ×5 IMPLANT
HEMOSTAT SNOW SURGICEL 2X4 (HEMOSTASIS) ×1 IMPLANT
HEMOSTAT SURGICEL 2X14 (HEMOSTASIS) IMPLANT
INSERT FOGARTY SM (MISCELLANEOUS) IMPLANT
KIT BASIN OR (CUSTOM PROCEDURE TRAY) ×2 IMPLANT
KIT ROOM TURNOVER OR (KITS) ×2 IMPLANT
NS IRRIG 1000ML POUR BTL (IV SOLUTION) ×4 IMPLANT
PACK CAROTID (CUSTOM PROCEDURE TRAY) ×2 IMPLANT
PAD ARMBOARD 7.5X6 YLW CONV (MISCELLANEOUS) ×4 IMPLANT
PATCH VASCULAR VASCU GUARD 1X6 (Vascular Products) ×2 IMPLANT
SHUNT CAROTID BYPASS 10 (VASCULAR PRODUCTS) IMPLANT
SHUNT CAROTID BYPASS 12FRX15.5 (VASCULAR PRODUCTS) IMPLANT
SPECIMEN JAR SMALL (MISCELLANEOUS) ×2 IMPLANT
SPONGE INTESTINAL PEANUT (DISPOSABLE) ×3 IMPLANT
SUT ETHILON 3 0 PS 1 (SUTURE) IMPLANT
SUT PROLENE 6 0 BV (SUTURE) ×2 IMPLANT
SUT PROLENE 7 0 BV1 MDA (SUTURE) ×2 IMPLANT
SUT VIC AB 3-0 SH 27 (SUTURE) ×6
SUT VIC AB 3-0 SH 27X BRD (SUTURE) ×2 IMPLANT
SUT VICRYL 4-0 PS2 18IN ABS (SUTURE) ×2 IMPLANT
TOWEL OR 17X24 6PK STRL BLUE (TOWEL DISPOSABLE) ×2 IMPLANT
TOWEL OR 17X26 10 PK STRL BLUE (TOWEL DISPOSABLE) ×2 IMPLANT
WATER STERILE IRR 1000ML POUR (IV SOLUTION) ×2 IMPLANT

## 2012-04-08 NOTE — Anesthesia Procedure Notes (Signed)
Procedure Name: Intubation Date/Time: 04/08/2012 10:06 AM Performed by: Rogelia Boga Pre-anesthesia Checklist: Patient identified, Emergency Drugs available, Suction available, Patient being monitored and Timeout performed Patient Re-evaluated:Patient Re-evaluated prior to inductionOxygen Delivery Method: Circle system utilized Intubation Type: IV induction Ventilation: Mask ventilation without difficulty Laryngoscope Size: Mac and 4 Grade View: Grade III Tube type: Oral Number of attempts: 1 Airway Equipment and Method: Stylet Placement Confirmation: positive ETCO2 and breath sounds checked- equal and bilateral Secured at: 22 cm Tube secured with: Tape Dental Injury: Teeth and Oropharynx as per pre-operative assessment

## 2012-04-08 NOTE — Progress Notes (Signed)
Utilization review completed.  

## 2012-04-08 NOTE — Preoperative (Signed)
Beta Blockers   Reason not to administer Beta Blockers:Not Applicable 

## 2012-04-08 NOTE — Telephone Encounter (Signed)
Left message for patient RE: 05/03/12 @ 1:15 follow up appt, dpm

## 2012-04-08 NOTE — Anesthesia Postprocedure Evaluation (Signed)
Anesthesia Post Note  Patient: Alec Harris  Procedure(s) Performed: Procedure(s) (LRB): ENDARTERECTOMY CAROTID (Right)  Anesthesia type: general  Patient location: PACU  Post pain: Pain level controlled  Post assessment: Patient's Cardiovascular Status Stable  Last Vitals:  Filed Vitals:   04/08/12 1350  BP: 123/54  Pulse:   Temp:   Resp:     Post vital signs: Reviewed and stable  Level of consciousness: sedated  Complications: No apparent anesthesia complications

## 2012-04-08 NOTE — Interval H&P Note (Signed)
History and Physical Interval Note:  04/08/2012 9:32 AM  Alec Harris  has presented today for surgery, with the diagnosis of right ICA stenosis  The various methods of treatment have been discussed with the patient and family. After consideration of risks, benefits and other options for treatment, the patient has consented to  Procedure(s) (LRB): ENDARTERECTOMY CAROTID (Right) as a surgical intervention .  The patient's history has been reviewed, patient examined, no change in status, stable for surgery.  I have reviewed the patient's chart and labs.  Questions were answered to the patient's satisfaction.     BRABHAM IV, V. WELLS

## 2012-04-08 NOTE — Transfer of Care (Signed)
Immediate Anesthesia Transfer of Care Note  Patient: Alec Harris  Procedure(s) Performed: Procedure(s) (LRB): ENDARTERECTOMY CAROTID (Right)  Patient Location: PACU  Anesthesia Type: General  Level of Consciousness: awake, alert , oriented and patient cooperative  Airway & Oxygen Therapy: Patient Spontanous Breathing and Patient connected to nasal cannula oxygen  Post-op Assessment: Report given to PACU RN, Post -op Vital signs reviewed and stable, Patient moving all extremities X 4 and Patient able to stick tongue midline  Post vital signs: Reviewed and stable  Complications: No apparent anesthesia complications

## 2012-04-08 NOTE — Telephone Encounter (Signed)
Message copied by Fredrich Birks on Thu Apr 08, 2012  2:19 PM ------      Message from: Melene Plan      Created: Thu Apr 08, 2012 12:55 PM                   ----- Message -----         From: [place], [place]         Sent: 04/08/2012  12:35 PM           To: Melene Plan, RN            2 week F/U - CE[facility] Myra Gianotti

## 2012-04-08 NOTE — Interval H&P Note (Signed)
History and Physical Interval Note:  04/08/2012 9:31 AM  Alec Harris  has presented today for surgery, with the diagnosis of right ICA stenosis  The various methods of treatment have been discussed with the patient and family. After consideration of risks, benefits and other options for treatment, the patient has consented to  Procedure(s) (LRB): ENDARTERECTOMY CAROTID (Right) as a surgical intervention .  The patient's history has been reviewed, patient examined, no change in status, stable for surgery.  I have reviewed the patient's chart and labs.  Questions were answered to the patient's satisfaction.     BRABHAM IV, V. WELLS

## 2012-04-08 NOTE — H&P (View-Only) (Signed)
PCR NASAL SWAB  RESULTS CAME BACK  'INCONCLUSIVE'. LAB STATED THIS SOMETIMES HAPPENS AND THEY HAVE TO SEND SPECIMEN OFF... RESULTS SHOULD BE BACK BY Monday...Marland KitchenDA

## 2012-04-08 NOTE — H&P (View-Only) (Signed)
[facility] and [facility]in Specialist of [facility]   Patient name: Alec Harris MRN: [id number] DOB: Sep 28, 1945 Sex: male   Referred by: Dr. Allyson Sabal  Reason for referral:  Chief Complaint  Patient presents with  . Carotid    CEA vs stent/ Dr. Nanetta Batty    HISTORY OF PRESENT ILLNESS: The patient is referred today for evaluation of bilateral carotid stenosis, right greater than left, both of which are asymptomatic. He has recently undergone angiography which reveals a 95% right carotid stenosis 80% left carotid stenosis. The patient has a history of cervical fusion and is being considered for stenting versus endarterectomy of his carotid stenosis.  The patient suffers from peripheral vascular disease with bilateral claudication which has been alleviated with cilostazol. He does have coronary artery disease but has a low risk scan (Myoview) from May of 2013. He is a smoker.  Past Medical History  Diagnosis Date  . Prostate cancer   . Rectal bleeding 07/24/11    Diverticular Bleeding  . PAD (peripheral artery disease) 12/2011  . Carotid artery disease     By doppler    Past Surgical History  Procedure Date  . Knee cartilage surgery     surgery for a torn ligament  . Cervical spine surgery approx. 10 years ago    disc removed from neck  . Colonoscopy 07/25/2011    Procedure: COLONOSCOPY;  Surgeon: Theda Belfast;  Location: [facility];  Service: Endoscopy;  Laterality: N/A;  . Prostate surgery 2003    for  prostate cancer    History   Social History  . Marital Status: Married    Spouse Name: N/A    Number of Children: N/A  . Years of Education: N/A   Occupational History  . Not on file.   Social History Main Topics  . Smoking status: Current Everyday Smoker    Types: Cigarettes, Cigars  . Smokeless tobacco: Never Used   Comment: pt states he smokes about 6 cigarettes per day  . Alcohol Use: 6.6 - 7.2 oz/week    10 Cans of beer, 1-2 Shots of liquor per week  . Drug Use:  No  . Sexually Active: Not on file   Other Topics Concern  . Not on file   Social History Narrative  . No narrative on file    Family History  Problem Relation Age of Onset  . Hypertension Mother   . Heart attack Mother   . Heart attack Father   . Cancer Brother     throat    Allergies as of 03/29/2012  . (No Known Allergies)    Current Outpatient Prescriptions on File Prior to Visit  Medication Sig Dispense Refill  . aspirin 81 MG chewable tablet Chew 81 mg by mouth daily.        . Calcium Carb-Cholecalciferol (CALCIUM 500 +D) 500-400 MG-UNIT TABS Take 1 tablet by mouth 2 (two) times daily.      . cilostazol (PLETAL) 100 MG tablet Take 100 mg by mouth daily.      . hydrocortisone (CORTEF) 20 MG tablet Take 20 mg by mouth daily.        [name] ketoconazole (NIZORAL) 200 MG tablet Take 200 mg by mouth 3 (three) times daily.        . rosuvastatin (CRESTOR) 10 MG tablet Take 10 mg by mouth daily.         REVIEW OF SYSTEMS: Cardiovascular: No chest pain, chest pressure, palpitations, orthopnea, or dyspnea on exertion  No history  of DVT or phlebitis. Pulmonary: No productive cough, asthma or wheezing. Neurologic: No weakness, paresthesias, aphasia, or amaurosis. No dizziness. Hematologic: No bleeding problems or clotting disorders. Musculoskeletal: No joint pain or joint swelling. Gastrointestinal: No blood in stool or hematemesis Genitourinary: No dysuria or hematuria. Psychiatric:: No history of major depression. Integumentary: No rashes or ulcers. Constitutional: No fever or chills.  PHYSICAL EXAMINATION: General: The patient appears their stated age.  Vital signs are BP 155/64  Pulse 89  Resp 16  Ht 5\' 9"  (1.753 m)  Wt 237 lb (107.502 kg)  BMI 35.00 kg/m2  SpO2 100% HEENT:  No gross abnormalities Pulmonary: Respirations are non-labored Musculoskeletal: There are no major deformities.   Neurologic: No focal weakness or paresthesias are detected, Skin: There are no  ulcer or rashes noted. Psychiatric: The patient has normal affect. Cardiovascular: There is a regular rate and rhythm without significant murmur appreciated. Bilateral carotid bruits  Diagnostic Studies: None in the office today  Outside Studies/Documentation Historical records were reviewed.  They showed high-grade right carotid stenosis, left carotid stenosis  Medication Changes: I will stop his Pletal 5 days prior to surgery  Assessment:  Asymptomatic bilateral carotid stenosis, right greater than left Plan: I have reviewed his angiogram and ultrasound findings which showed high-grade bilateral carotid stenosis, right greater than left. The patient is asymptomatic. I do not feel that his cervical instrumentation limits the range of motion in his neck. I therefore do not think that he is high risk surgically from his neck restriction. Therefore I feel that the best course of action is to proceed with right carotid endarterectomy and continued surveillance of his left carotid stenosis. I discussed the risks and benefits of endarterectomy which include the risk of stroke, the risk of nerve injury, the risk of bleeding, risk of cardiopulmonary complications, and the risk of recurrent disease. All of his questions were answered. I will stop his Pletal 5 days prior to his operation which is been scheduled for Thursday, August 22     V. Charlena Cross, M.D. [facility] [place] Office: [phone] Pager:  [phone]

## 2012-04-08 NOTE — Op Note (Signed)
Vascular and Vein Specialists of [facility]  Patient name: Alec Harris MRN: [id number] DOB: [birth year] Sex: male  04/08/2012 Pre-operative Diagnosis: Asymptomatic   right carotid stenosis Post-operative diagnosis:  Same Surgeon:  Jorge Ny Assistants:  Della Goo, P.A. Procedure:    right carotid Endarterectomy with bovine pericardial patch angioplasty Anesthesia:  General Blood Loss:  See anesthesia record Specimens:  Carotid Plaque to pathology  Findings:  90  %stenosis; Thrombus:  None  Indications:  The patient was found to have asymptomatic bilateral carotid stenosis which was confirmed by angiogram.  The right side is more severe.  He comes in today for endarterectomy.  He has been without symptoms since I saw him in the office.  Procedure:  The patient was identified in the holding area and taken to [facility] OR ROOM 11  The patient was then placed supine on the table.   General endotrachial anesthesia was administered.  The patient was prepped and draped in the usual sterile fashion.  A time out was called and antibiotics were administered.  The incision was made along the anterior border of the right sternocleidomastoid muscle.  Cautery was used to dissect through the subcutaneous tissue.  The platysma muscle was divided with cautery.  The internal jugular vein was exposed along its anterior medial border.  The common facial vein was exposed and then divided between 2-0 silk ties and metal clips.  The common carotid artery was then circumferentially exposed and encircled with an umbilical tape.  The vagus nerve was identified and protected.  Next sharp dissection was used to expose the external carotid artery and the superior thyroid artery.  The were encircled with a blue vessel loop and a 2-0 silk tie respectively.  Finally, the internal carotid was carefully dissected free.  An umbilical tape was placed around the internal carotid artery distal to the diseased segment.  The  hypoglossal nerve was visualized throughout and protected.  The patient was given systemic heparinization.  A bovine carotid patch was selected and prepared on the back table. Due to the location of the lesion and pulsatile backbleeding, I elected not to place a shunt.  After blood pressure readings were appropriate and the heparin had been given time to circulate, the internal carotid artery was occluded with a baby Gregory clamp.  The external and common carotid arteries were then occluded with vascular clamps and the 2-0 tie tightened on the superior thyroid artery.  A #11 blade was used to make an arteriotomy in the common carotid artery.  This was extended with Potts scissors along the anterior and lateral border of the common and internal carotid artery.  Approximately 90% stenosis was identified.  There was no thrombus identified.  The 10 french shunt was then placed.  A kleiner kuntz elevator was used to perform endarterectomy.  An eversion endarterectomy was performed in the external carotid artery.  A good distal endpoint was obtained in the internal carotid artery.  The specimen was removed and sent to pathology.  Heparinized saline was used to irrigate the endarterectomized field.  All potential embolic debris was removed.  Bovine pericardial patch angioplasty was then performed using a running 6-0 Prolene.  The common internal and external carotid arteries were all appropriately flushed. The artery was again irrigated with heparin saline.  The anastomosis was then secured. The clamp was first released on the external carotid artery followed by the common carotid artery approximately 30 seconds later, bloodflow was reestablish through the internal carotid artery.  Next, a hand-held  Doppler was used to evaluate the signals in the common, external, and internal  carotid arteries, all of which had appropriate signals. I then administered  50 mg protamine. The wound was then irrigated.  After hemostasis  was achieved, the carotid sheath was reapproximated with 3-0 Vicryl. The  platysma muscle was reapproximated with running 3-0 Vicryl. The skin  was closed with 4-0 Vicryl. Dermabond was placed on the skin. The  patient was then successfully extubated. His neurologic exam was  similar to his preprocedural exam. The patient was then taken to recovery room  in stable condition. There were no complications.     Disposition:  To PACU in stable condition.  Relevant Operative Details:  Very large common carotid and internal carotid artery.  Lesion was 2cm above the bifurcation.  No shunt was placed because of how high the lesion was.  Juleen China, M.D. [facility] [place] Office: [phone] Pager:  [phone]

## 2012-04-08 NOTE — Anesthesia Preprocedure Evaluation (Addendum)
Anesthesia Evaluation  Patient identified by MRN, date of birth, ID band Patient awake    Reviewed: Allergy & Precautions, H&P , NPO status   Airway Mallampati: II TM Distance: >3 FB Neck ROM: Full    Dental  (+) Dental Advisory Given   Pulmonary Current Smoker,          Cardiovascular     Neuro/Psych    GI/Hepatic   Endo/Other    Renal/GU      Musculoskeletal   Abdominal   Peds  Hematology   Anesthesia Other Findings Bilateral ICA stenosis, R>L, PAD  Reproductive/Obstetrics                          Anesthesia Physical Anesthesia Plan  ASA: III  Anesthesia Plan: MAC   Post-op Pain Management:    Induction: Intravenous  Airway Management Planned: Oral ETT  Additional Equipment: Arterial line  Intra-op Plan:   Post-operative Plan: Extubation in OR  Informed Consent: I have reviewed the patients History and Physical, chart, labs and discussed the procedure including the risks, benefits and alternatives for the proposed anesthesia with the patient or authorized representative who has indicated his/her understanding and acceptance.   Dental advisory given  Plan Discussed with: Anesthesiologist, Surgeon and CRNA  Anesthesia Plan Comments:         Anesthesia Quick Evaluation

## 2012-04-09 ENCOUNTER — Encounter (HOSPITAL_COMMUNITY): Payer: Self-pay | Admitting: Surgery

## 2012-04-09 LAB — BASIC METABOLIC PANEL
BUN: 12 mg/dL (ref 6–23)
GFR calc non Af Amer: 84 mL/min — ABNORMAL LOW (ref >90)
Glucose, Bld: 109 mg/dL — ABNORMAL HIGH (ref 70–99)
Potassium: 4 mEq/L (ref 3.5–5.1)

## 2012-04-09 LAB — CBC
HCT: 36.7 % — ABNORMAL LOW (ref 39.0–52.0)
Hemoglobin: 12.3 g/dL — ABNORMAL LOW (ref 13.0–17.0)
MCH: 30.1 pg (ref 26.0–34.0)
MCHC: 33.5 g/dL (ref 30.0–36.0)
MCV: 89.7 fL (ref 78.0–100.0)

## 2012-04-09 NOTE — Discharge Summary (Signed)
Vascular and Vein Specialists Discharge Summary   Patient ID:  Alec Harris MRN: [id number] DOB/AGE: Aug 26, 1945 66 y.o.  Admit date: 04/08/2012 Discharge date: 04/09/2012 Date of Surgery: 04/08/2012 Surgeon: Surgeon(s): Nada Libman, MD  Admission Diagnosis: right ICA stenosis  Discharge Diagnoses:  right ICA stenosis  Secondary Diagnoses: Past Medical History  Diagnosis Date  . Prostate cancer   . Rectal bleeding 07/24/11    Diverticular Bleeding  . PAD (peripheral artery disease) 12/2011  . Carotid artery disease     By doppler    Procedure(s): ENDARTERECTOMY CAROTID  Discharged Condition: good  HPI:  Alec Harris is a 66 y.o. male who was referred for evaluation of bilateral carotid stenosis, right greater than left, both of which are asymptomatic. He has recently undergone angiography which reveals a 95% right carotid stenosis 80% left carotid stenosis. The patient has a history of cervical fusion and is being considered for stenting versus endarterectomy of his carotid stenosis. Dr Myra Gianotti reviewed his angiogram and ultrasound findings which showed high-grade bilateral carotid stenosis, right greater than left. The patient is asymptomatic. It was felt that the best course of action is to proceed with right carotid endarterectomy and continued surveillance of his left carotid stenosis.   Hospital Course:  Alec Harris is a 66 y.o. male is S/P Right Procedure(s): ENDARTERECTOMY CAROTID Extubated: POD # 0 Post-op wounds healing well Pt. Ambulating, voiding and taking PO diet without difficulty. Pt pain controlled with PO pain meds. Neuro exam is normal and intact Labs as below Complications:none  Consults:     Significant Diagnostic Studies: CBC Lab Results  Component Value Date   WBC 10.5 04/09/2012   HGB 12.3* 04/09/2012   HCT 36.7* 04/09/2012   MCV 89.7 04/09/2012   PLT 181 04/09/2012    BMET    Component Value Date/Time   NA 139 04/09/2012 0500   K  4.0 04/09/2012 0500   CL 106 04/09/2012 0500   CO2 27 04/09/2012 0500   GLUCOSE 109* 04/09/2012 0500   BUN 12 04/09/2012 0500   CREATININE 0.93 04/09/2012 0500   CALCIUM 8.6 04/09/2012 0500   GFRNONAA 84* 04/09/2012 0500   GFRAA >90 04/09/2012 0500   COAG Lab Results  Component Value Date   INR 0.90 04/02/2012   INR 1.01 07/24/2011     Disposition:  Discharge to :Home Discharge Orders    Future Appointments: Provider: Department: Dept Phone: Center:   05/03/2012 1:15 PM Nada Libman, MD [facility] [phone] [facility]     Future Orders Please Complete By Expires   Resume previous diet      Driving Restrictions      Comments:   No driving for 2 weeks   Lifting restrictions      Comments:   No lifting for 4 week   Call MD for:  temperature >100.5      Call MD for:  redness, tenderness, or signs of infection (pain, swelling, bleeding, redness, odor or green/yellow discharge around incision site)      Call MD for:  severe or increased pain, loss or decreased feeling  in affected limb(s)      CAROTID Sugery: Call MD for difficulty swallowing or speaking; weakness in arms or legs that is a new symtom; severe headache.  If you have increased swelling in the neck and/or  are having difficulty breathing, CALL 911      may wash over wound with mild soap and water      No dressing needed  Increase activity slowly      Comments:   Walk with assistance use walker or cane as needed   May shower       Scheduling Instructions:   Minor, [place]  Home Medication Instructions QMV:[id number]   Printed on:04/09/12 [phone]  Medication Information                    ketoconazole (NIZORAL) 200 MG tablet Take 200 mg by mouth 3 (three) times daily.             hydrocortisone (CORTEF) 20 MG tablet Take 20 mg by mouth daily.             aspirin 81 MG chewable tablet Chew 81 mg by mouth daily.             cilostazol (PLETAL) 100 MG tablet Take 100 mg by mouth daily.             Calcium Carb-Cholecalciferol (CALCIUM 500 +D) 500-400 MG-UNIT TABS Take 1 tablet by mouth 2 (two) times daily.           oxyCODONE-acetaminophen (ROXICET) 5-325 MG per tablet Take 1 tablet by mouth every 4 (four) hours as needed for pain.            Verbal and written Discharge instructions given to the patient. Wound care per Discharge AVS Follow-up Information    Follow up with Myra Gianotti IV, Lala Lund, MD in 2 weeks. (office will arrange -sent)    Contact information:   [address] [place] [address] [phone]          Signed: Marlowe Shores 04/09/2012, 7:44 AM

## 2012-04-09 NOTE — Progress Notes (Signed)
VASCULAR AND VEIN SURGERY POST - OP CEA PROGRESS NOTE  Date of Surgery: 04/08/2012 Surgeon: Surgeon(s): Nada Libman, MD 1 Day Post-Op right Carotid Endarterectomy .  HPI: Alec Harris is a 66 y.o. male who is 1 Day Post-Op right Carotid Endarterectomy . Patient is doing well. Patient denies headache; Patient denies difficulty swallowing; denies weakness in upper or lower extremities; Pt. denies other symptoms of stroke or TIA.  IMAGING: No results found.  Significant Diagnostic Studies: CBC Lab Results  Component Value Date   WBC 10.5 04/09/2012   HGB 12.3* 04/09/2012   HCT 36.7* 04/09/2012   MCV 89.7 04/09/2012   PLT 181 04/09/2012    BMET    Component Value Date/Time   NA 139 04/09/2012 0500   K 4.0 04/09/2012 0500   CL 106 04/09/2012 0500   CO2 27 04/09/2012 0500   GLUCOSE 109* 04/09/2012 0500   BUN 12 04/09/2012 0500   CREATININE 0.93 04/09/2012 0500   CALCIUM 8.6 04/09/2012 0500   GFRNONAA 84* 04/09/2012 0500   GFRAA >90 04/09/2012 0500    COAG Lab Results  Component Value Date   INR 0.90 04/02/2012   INR 1.01 07/24/2011   No results found for this basename: PTT      Intake/Output Summary (Last 24 hours) at 04/09/12 0743 Last data filed at 04/09/12 1610  Gross per 24 hour  Intake   2340 ml  Output   1500 ml  Net    840 ml    Physical Exam:  BP Readings from Last 3 Encounters:  04/09/12 139/51  04/09/12 139/51  04/02/12 135/80   Temp Readings from Last 3 Encounters:  04/09/12 98.3 F (36.8 C)   04/09/12 98.3 F (36.8 C)   04/02/12 98.9 F (37.2 C)    SpO2 Readings from Last 3 Encounters:  04/09/12 94%  04/09/12 94%  04/02/12 97%   Pulse Readings from Last 3 Encounters:  04/09/12 58  04/09/12 58  04/02/12 72    Pt is A&O x 3 Gait is normal Speech is fluent right Neck Wound is healing well Patient with Negative tongue deviation and Negative facial droop Pt has good and equal strength in all extremities  Assessment: Alec Harris is a 66  y.o. male is S/P Right Carotid endarterectomy Pt is voiding, ambulating and taking po well   Plan: Discharge to: Home Follow-up in 2 weeks   Novah Goza J [phone] 04/09/2012 7:43 AM

## 2012-04-09 NOTE — Discharge Summary (Signed)
I agree with the above.  The patient was seen and examined.  He is neurologically intact.  His incision is clean, and he is tolerating his diet without difficulty.  He will follow up in 2-3 weeks  Alec Harris

## 2012-04-09 NOTE — Progress Notes (Signed)
Pt voiding, ambulating and eating without difficulty.  Discharge instructions given to pt.  Pt discharged home with wife.  Roselie Awkward, RN

## 2012-04-30 ENCOUNTER — Encounter: Payer: Self-pay | Admitting: Surgery

## 2012-05-03 ENCOUNTER — Ambulatory Visit (INDEPENDENT_AMBULATORY_CARE_PROVIDER_SITE_OTHER): Payer: Medicare Other | Admitting: Surgery

## 2012-05-03 ENCOUNTER — Encounter: Payer: Self-pay | Admitting: Surgery

## 2012-05-03 VITALS — BP 143/55 | HR 81 | Temp 99.4°F | Ht 69.0 in | Wt 234.0 lb

## 2012-05-03 DIAGNOSIS — I6529 Occlusion and stenosis of unspecified carotid artery: Secondary | ICD-10-CM

## 2012-05-03 NOTE — Progress Notes (Signed)
The patient is back today for followup. He is status post right carotid endarterectomy with patch angioplasty on 04/08/2012. This was done for asymptomatic carotid stenosis. Angiography revealed an addition to his right carotid stenosis, a 70% left carotid stenosis.  The patient's postoperative course was uneventful. He has no complaints today.  His incision is well-healed. He is neurologically intact.  The patient is scheduled to get surveillance carotid ultrasound at [facility]. Therefore, I am not scheduling him for followup with me and he'll he progresses to greater than 80% on the left side. I will have his vocal cords evaluated by ENT prior to his operation on the left since he has artery had a right carotid endarterectomy.

## 2012-05-06 DIAGNOSIS — C61 Malignant neoplasm of prostate: Secondary | ICD-10-CM | POA: Diagnosis not present

## 2012-05-07 ENCOUNTER — Other Ambulatory Visit (HOSPITAL_COMMUNITY): Payer: Self-pay | Admitting: Urology

## 2012-05-07 DIAGNOSIS — C61 Malignant neoplasm of prostate: Secondary | ICD-10-CM

## 2012-05-18 ENCOUNTER — Ambulatory Visit (HOSPITAL_COMMUNITY)
Admission: RE | Admit: 2012-05-18 | Discharge: 2012-05-18 | Disposition: A | Discharge status: Home / Self Care | Payer: Medicare Other | Source: Ambulatory Visit | Attending: Urology | Admitting: Urology

## 2012-05-18 DIAGNOSIS — C61 Malignant neoplasm of prostate: Secondary | ICD-10-CM | POA: Diagnosis not present

## 2012-05-18 DIAGNOSIS — N644 Mastodynia: Secondary | ICD-10-CM | POA: Diagnosis not present

## 2012-05-18 LAB — TYPE AND SCREEN
ABO/RH(D): A POS
Unit division: 0
Unit division: 0

## 2012-05-18 MED ORDER — TECHNETIUM TC 99M MEDRONATE IV KIT
25.0000 | PACK | Freq: Once | INTRAVENOUS | Status: AC | PRN
  Administered 2012-05-18: 25 via INTRAVENOUS

## 2012-05-20 DIAGNOSIS — C61 Malignant neoplasm of prostate: Secondary | ICD-10-CM | POA: Diagnosis not present

## 2012-05-21 ENCOUNTER — Other Ambulatory Visit: Payer: Self-pay | Admitting: Oncology

## 2012-05-21 ENCOUNTER — Telehealth: Payer: Self-pay | Admitting: Oncology

## 2012-05-21 DIAGNOSIS — C61 Malignant neoplasm of prostate: Secondary | ICD-10-CM

## 2012-05-21 NOTE — Telephone Encounter (Signed)
S/W pt in re NP appt 10/10 @ 1:30 w/ Dr. Clelia Croft.  Referring Dr. Brunilda Payor Dx- Prostate Ca NP packet mailed.

## 2012-05-21 NOTE — Telephone Encounter (Signed)
C/D 05/21/12 for appt.05/27/12

## 2012-05-27 ENCOUNTER — Ambulatory Visit: Payer: Medicare Other

## 2012-05-27 ENCOUNTER — Other Ambulatory Visit (HOSPITAL_BASED_OUTPATIENT_CLINIC_OR_DEPARTMENT_OTHER): Payer: Medicare Other | Admitting: Lab

## 2012-05-27 ENCOUNTER — Ambulatory Visit (HOSPITAL_BASED_OUTPATIENT_CLINIC_OR_DEPARTMENT_OTHER): Payer: Medicare Other | Admitting: Oncology

## 2012-05-27 ENCOUNTER — Telehealth: Payer: Self-pay | Admitting: Oncology

## 2012-05-27 VITALS — BP 184/72 | HR 82 | Temp 98.9°F | Resp 20 | Ht 69.0 in | Wt 233.0 lb

## 2012-05-27 DIAGNOSIS — I779 Disorder of arteries and arterioles, unspecified: Secondary | ICD-10-CM

## 2012-05-27 DIAGNOSIS — C61 Malignant neoplasm of prostate: Secondary | ICD-10-CM

## 2012-05-27 DIAGNOSIS — C7951 Secondary malignant neoplasm of bone: Secondary | ICD-10-CM | POA: Diagnosis not present

## 2012-05-27 DIAGNOSIS — C7952 Secondary malignant neoplasm of bone marrow: Secondary | ICD-10-CM | POA: Diagnosis not present

## 2012-05-27 LAB — COMPREHENSIVE METABOLIC PANEL (CC13)
AST: 8 U/L (ref 5–34)
Albumin: 3.5 g/dL (ref 3.5–5.0)
Alkaline Phosphatase: 77 U/L (ref 40–150)
Glucose: 123 mg/dl — ABNORMAL HIGH (ref 70–99)
Potassium: 4.1 mEq/L (ref 3.5–5.1)
Sodium: 139 mEq/L (ref 136–145)
Total Protein: 7 g/dL (ref 6.4–8.3)

## 2012-05-27 LAB — CBC WITH DIFFERENTIAL/PLATELET
Eosinophils Absolute: 0.1 10*3/uL (ref 0.0–0.5)
MCV: 89.7 fL (ref 79.3–98.0)
MONO%: 8 % (ref 0.0–14.0)
NEUT#: 4.6 10*3/uL (ref 1.5–6.5)
RBC: 4.74 10*6/uL (ref 4.20–5.82)
RDW: 15.5 % — ABNORMAL HIGH (ref 11.0–14.6)
WBC: 8.3 10*3/uL (ref 4.0–10.3)
lymph#: 2.9 10*3/uL (ref 0.9–3.3)

## 2012-05-27 MED ORDER — ABIRATERONE ACETATE 250 MG PO TABS: 1000.0000 mg | ORAL_TABLET | Freq: Every day | ORAL | Status: DC

## 2012-05-27 NOTE — Progress Notes (Signed)
CC:   Lindaann Slough, M.D. Nanetta Batty, M.D.  REASON FOR CONSULTATION:  Prostate cancer.  HISTORY OF PRESENT ILLNESS:  Alec Harris is a very nice 66 year old African American gentleman, currently of [place].  He is currently retired.  Also service in the [employer] for 2 years during [place] War. Gentleman with a past medical history significant for hyperlipidemia. He also has history of carotid disease.  His history of prostate cancer dates back to 2003, when he presented with an elevated PSA under the care of Dr. Brunilda Payor.  At that time, he underwent a prostatectomy and the pathology from that operation showed a prostatic adenocarcinoma, Gleason score 4 + 4 equals 8, with the tumor involving the margins and the pathological staging is pT4 N1 with 1 out of 2 left pelvic lymph nodes involved.  The patient was treated with adjuvant hormone therapy with Lupron since that time.  In the interim, the patient remained really disease-free and PSA under control until the last few years, when he developed a rise in his PSA and bony metastasis.  At that time, he was treated with ketoconazole and prednisone.  However, most recently his PSA started to rise.  On September 19th, his PSA was 5.29.  In June it was 4.0, and in October 2012 was 3, indicating a doubling time of just over 6 months.  The patient had a nuclear bone scan done on October 1st which showed he had a stable focus of increased uptake in the right lower lumbar spine at L4 and L5, abnormal uptake in the mid sternum, which has progressed since June 2012.  Other than that, he had really limited disease at that time.  The patient at that time was weaned off ketoconazole and currently weaning off hydrocortisone.  Clinically he feels well.  He is asymptomatic at this time.  He does not report any bony pains, does not report any back pains, does not report any shoulder pain.  He still has a very good, reasonable performance status.   He exercises regularly and plays golf as much as he can.  His urine flow is excellent or not problematic at this time.  REVIEW OF SYSTEMS:  Does not report any headaches, blurry vision, double vision.  Does not any motor or sensory neuropathy.  Does not report any alteration in mental status, psychiatric issues, or depression.  Does not report any fever, chills, sweats.  Does not report any cough, hemoptysis, hematemesis.  No nausea, vomiting, abdominal pain, hematochezia, melena, genitourinary complaints.  Rest of review of systems unremarkable.  PAST MEDICAL HISTORY:  Significant for hyperlipidemia and also has history of carotid disease, status post endarterectomy, history of arthritis, and prostate cancer as mentioned.  MEDICATIONS:  He is on aspirin 81 mg daily.  He is on vitamin D and calcium supplements.  He is on Crestor, as well as cilostazol 100 mg.  ALLERGIES:  None.  SOCIAL HISTORY:  He is married.  He has 3 children.  Smokes about 4-5 cigarettes a day.  Two beers maximum, probably less than that, a month.  FAMILY HISTORY:  His half-brother has prostate cancer.  Father had coronary disease.  Mother died from cardiac complications.  PHYSICAL EXAMINATION:  General:  Alert, awake gentleman, appeared in no active distress.  Vital Signs:  His blood pressure is 184/72, pulse 82, respirations 20.  He is afebrile.  HEENT:  Head is normocephalic, atraumatic.  Pupils equal, round, and reactive to light.  Oral mucosa moist and pink.  Neck:  Supple without lymphadenopathy.  Heart:  Regular rate and rhythm, S1 and S2.  Lungs:  Clear to auscultation without rhonchi, wheezes, or dullness to percussion.  Abdomen:  Soft, nontender. No hepatosplenomegaly.  Extremities:  No clubbing, cyanosis, or edema. Neurologically:  Intact motor, sensory, and deep tendon reflexes.  LABORATORY DATA:  Showed a hemoglobin of 14.4, white cell count of 8.3, platelet count of 192.  ASSESSMENT AND PLAN:   This is a pleasant 66 year old gentleman with the following issues: 1. Prostate cancer.  His initial diagnosis dates back to 2003.  He had     a Gleason score 4 + 4 equals 8.  His clinical staging is T4 N1.  He     is status post prostatectomy, followed by Lupron hormonal therapy     for advanced disease.  At one point, he was treated with Casodex in     addition to Lupron and for the last few years he had been on second-     line hormone manipulation with ketoconazole and prednisone.  His     last bone scan on May 18, 2012, showed progression of disease.     Also, he had a rise in his PSA up to 5.29 on September 19th,     indicating castration-resistant disease after ketoconazole and     prednisone.  He is asymptomatic at this point and as mentioned has     rather minimal bony involvement.  I discussed with him the natural     course of prostate cancer, as well as the different treatment     options, especially for castration-resistant disease that is     asymptomatic.  I talked to him about Provenge immunotherapy, also     Zytiga hormone therapy, as well as systemic chemotherapy with     Taxotere.  At this time, he is agreeable with Zytiga.  Risks and     benefits of that were discussed today.  I have given him written     information on toxicity that includes fluid retention, GI toxicity,     adrenal insufficiency, hypokalemia, and lower extremity edema.  All     were discussed today.  He is agreeable to proceed.  I have given     him written information about that and hopefully we will proceed in     the next week or 2 and then we will reassess him next month for any     toxicities. 2. Hormonal deprivation.  At this time, we will continue Lupron under     the care of Dr. Brunilda Payor. 3. Bone-directed therapy.  I will address that with him in the future.     He has very minimal bony involvement, but certainly that would be a     consideration down the line. 4. Vitamin D and calcium  supplements.  He is already on that for the     time being.  All his questions were answered today.    ______________________________ Benjiman Core, M.D. FNS/MEDQ  D:  05/27/2012  T:  05/27/2012  Job:  [id number]

## 2012-05-27 NOTE — Telephone Encounter (Signed)
gv and printed appt schedule for NOV for pt.

## 2012-05-27 NOTE — Progress Notes (Signed)
Note dictated

## 2012-05-28 ENCOUNTER — Encounter: Payer: Self-pay | Admitting: Oncology

## 2012-05-28 NOTE — Progress Notes (Signed)
Faxed zytiga prescription to [facility] °

## 2012-05-31 NOTE — Progress Notes (Signed)
RECEIVED A FAX FROM [facility] CONCERNING A CONFIRMATION OF PRESCRIPTION SHIPMENT FOR ZYTIGA ON 05/28/12.

## 2012-06-14 DIAGNOSIS — C61 Malignant neoplasm of prostate: Secondary | ICD-10-CM | POA: Diagnosis not present

## 2012-06-22 ENCOUNTER — Other Ambulatory Visit: Payer: Self-pay | Admitting: *Deleted

## 2012-06-22 DIAGNOSIS — C61 Malignant neoplasm of prostate: Secondary | ICD-10-CM

## 2012-06-22 MED ORDER — ABIRATERONE ACETATE 250 MG PO TABS: 1000.0000 mg | ORAL_TABLET | Freq: Every day | ORAL | Status: DC

## 2012-06-22 NOTE — Addendum Note (Signed)
Addended by: Arvilla Meres on: 06/22/2012 02:29 PM   Modules accepted: Orders

## 2012-06-22 NOTE — Telephone Encounter (Signed)
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 

## 2012-06-24 NOTE — Telephone Encounter (Signed)
RECEIVED A FAX FROM [facility] CONCERNING A CONFIRMATION OF PRESCRIPTION SHIPMENT FOR ZYTIGA ON 06/23/12.

## 2012-07-06 ENCOUNTER — Ambulatory Visit (HOSPITAL_BASED_OUTPATIENT_CLINIC_OR_DEPARTMENT_OTHER): Payer: Medicare Other | Admitting: Oncology

## 2012-07-06 ENCOUNTER — Telehealth: Payer: Self-pay | Admitting: Oncology

## 2012-07-06 ENCOUNTER — Other Ambulatory Visit (HOSPITAL_BASED_OUTPATIENT_CLINIC_OR_DEPARTMENT_OTHER): Payer: Medicare Other | Admitting: Lab

## 2012-07-06 VITALS — BP 178/83 | HR 79 | Temp 98.1°F | Resp 20 | Ht 69.0 in | Wt 232.8 lb

## 2012-07-06 DIAGNOSIS — C61 Malignant neoplasm of prostate: Secondary | ICD-10-CM

## 2012-07-06 DIAGNOSIS — C7951 Secondary malignant neoplasm of bone: Secondary | ICD-10-CM | POA: Diagnosis not present

## 2012-07-06 LAB — CBC WITH DIFFERENTIAL/PLATELET
BASO%: 0.4 % (ref 0.0–2.0)
EOS%: 2.2 % (ref 0.0–7.0)
HCT: 41.4 % (ref 38.4–49.9)
LYMPH%: 53.3 % — ABNORMAL HIGH (ref 14.0–49.0)
MCH: 30.2 pg (ref 27.2–33.4)
MCHC: 33.8 g/dL (ref 32.0–36.0)
NEUT%: 37.6 % — ABNORMAL LOW (ref 39.0–75.0)
Platelets: 200 10*3/uL (ref 140–400)

## 2012-07-06 LAB — COMPREHENSIVE METABOLIC PANEL (CC13)
AST: 8 U/L (ref 5–34)
Alkaline Phosphatase: 89 U/L (ref 40–150)
BUN: 13 mg/dL (ref 7.0–26.0)
Calcium: 9.4 mg/dL (ref 8.4–10.4)
Creatinine: 1.1 mg/dL (ref 0.7–1.3)

## 2012-07-06 NOTE — Patient Instructions (Addendum)
Last PSA was 6/11 on 08/28/11. CBC from today is normal.  Return to our office in about 4 weeks.

## 2012-07-06 NOTE — Telephone Encounter (Signed)
appts made and printed for pt aom °

## 2012-07-07 ENCOUNTER — Encounter: Payer: Self-pay | Admitting: Oncology

## 2012-07-07 DIAGNOSIS — C61 Malignant neoplasm of prostate: Secondary | ICD-10-CM | POA: Insufficient documentation

## 2012-07-07 NOTE — Progress Notes (Signed)
Hematology and Oncology Follow Up Visit  Alec Harris [id number] 1945-09-24 66 y.o. 07/07/2012 1:17 PM Alec Harris, MDBerry, Delton See, MD   Principle Diagnosis: Castration resistant prostate cancer.  Prior Therapy: He was initially diagnosed in 2003 with an elevated PSA. He underwent a prostatectomy with a pathology that showed prostatic adenocarcinoma. Gleason score 4+4 equals 8, with the tumor involving the margins and the pathologic staging was pT4 N1 with 1 of 2 left pelvic lymph nodes involved. He was started on adjuvant hormone therapy with Lupron. He developed a rising PSA and bone metastasis. He was then treated with ketoconazole and prednisone. He developed a rising PSA with a doubling time of just over 6 months. Bone scan in September 2013 showed increased uptake in the right lower lumbar spine at L4 and L5 and abnormal uptake in the mid sternum which had progressed since the prior bone scan.  Current therapy: Zytiga 1000 mg daily started in October 2013. He remains on Lupron at [facility] urology.  Interim History:  [name] returns for a routine visit. He has been on Zytiga one month and is tolerating this well. He denies chest pain, shortness of breath, abdominal pain, nausea, vomiting. No lower extremity edema noted. No back pain. No neurological changes. Denies hematuria or other evidence of bleeding. Appetite and weight are stable.  Medications: I have reviewed the patient's current medications. Current outpatient prescriptions:abiraterone Acetate (ZYTIGA) 250 MG tablet, Take 4 tablets (1,000 mg total) by mouth daily. Take on an empty stomach 1 hour before or 2 hours after a meal, Disp: 120 tablet, Rfl: 1;  aspirin 81 MG chewable tablet, Chew 81 mg by mouth daily.  , Disp: , Rfl: ;  Calcium Carb-Cholecalciferol (CALCIUM 500 +D) 500-400 MG-UNIT TABS, Take 1 tablet by mouth 2 (two) times daily., Disp: , Rfl:  cilostazol (PLETAL) 100 MG tablet, Take 100 mg by mouth 2 (two) times  daily. , Disp: , Rfl:   Allergies: No Known Allergies  Past Medical History, Surgical history, Social history, and Family History were reviewed and updated.  Review of Systems: Constitutional:  Negative for fever, chills, night sweats, anorexia, weight loss, pain. Cardiovascular: no chest pain or dyspnea on exertion Respiratory: no cough, shortness of breath, or wheezing Neurological: no TIA or stroke symptoms Dermatological: negative ENT: negative Skin: Negative. Gastrointestinal: no abdominal pain, change in bowel habits, or black or bloody stools Genito-Urinary: no dysuria, trouble voiding, or hematuria Hematological and Lymphatic: negative Breast: negative for breast lumps Musculoskeletal: negative Remaining ROS negative.  Physical Exam: Blood pressure 178/83, pulse 79, temperature 98.1 F (36.7 C), temperature source Oral, resp. rate 20, height 5\' 9"  (1.753 m), weight 232 lb 12.8 oz (105.597 kg). ECOG: 1 General appearance: alert, cooperative and no distress Head: Normocephalic, without obvious abnormality, atraumatic Neck: no adenopathy, no carotid bruit, no JVD, supple, symmetrical, trachea midline and thyroid not enlarged, symmetric, no tenderness/mass/nodules Lymph nodes: Cervical, supraclavicular, and axillary nodes normal. Heart:regular rate and rhythm, S1, S2 normal, no murmur, click, rub or gallop Lung:chest clear, no wheezing, rales, normal symmetric air entry, no tachypnea, retractions or cyanosis Abdomen: soft, non-tender, without masses or organomegaly EXT:no erythema, induration, or nodules   Lab Results: Lab Results  Component Value Date   WBC 7.3 07/06/2012   HGB 14.0 07/06/2012   HCT 41.4 07/06/2012   MCV 89.4 07/06/2012   PLT 200 07/06/2012     Chemistry      Component Value Date/Time   NA 141 07/06/2012 1406   NA  139 04/09/2012 0500   K 4.1 07/06/2012 1406   K 4.0 04/09/2012 0500   CL 107 07/06/2012 1406   CL 106 04/09/2012 0500   CO2 27  07/06/2012 1406   CO2 27 04/09/2012 0500   BUN 13.0 07/06/2012 1406   BUN 12 04/09/2012 0500   CREATININE 1.1 07/06/2012 1406   CREATININE 0.93 04/09/2012 0500      Component Value Date/Time   CALCIUM 9.4 07/06/2012 1406   CALCIUM 8.6 04/09/2012 0500   ALKPHOS 89 07/06/2012 1406   ALKPHOS 70 04/02/2012 0900   AST 8 07/06/2012 1406   AST 13 04/02/2012 0900   ALT 7 07/06/2012 1406   ALT 9 04/02/2012 0900   BILITOT 0.39 07/06/2012 1406   BILITOT 0.2* 04/02/2012 0900     Results for [name], [name] (MRN [id number]) as of 07/07/2012 13:18  Ref. Range 05/27/2012 13:20  PSA Latest Range: <=4.00 ng/mL 6.11 (H)   Impression and Plan:  This is a 66 year-old gentleman with the following issues:  1. Castration resistant prostate cancer. Currently on site he had tolerating this well. I have recommended that he continue Zytiga without dose modification. PSA is pending today. 2. Androgen deprivation. He remains on Lupron under the care of Dr. Brunilda Payor. 3. Bone directed therapy. We will address this with him in the future. He has very minimal bone involvement, but we may consider this down the line. Continue calcium and vitamin D supplements. 4. Followup. In about 4-5 weeks.  Spent more than half the time coordinating care.    Dorsey Charette 11/20/20131:17 PM

## 2012-07-08 DIAGNOSIS — I10 Essential (primary) hypertension: Secondary | ICD-10-CM | POA: Diagnosis not present

## 2012-07-08 DIAGNOSIS — I739 Peripheral vascular disease, unspecified: Secondary | ICD-10-CM | POA: Diagnosis not present

## 2012-07-08 DIAGNOSIS — E782 Mixed hyperlipidemia: Secondary | ICD-10-CM | POA: Diagnosis not present

## 2012-07-08 DIAGNOSIS — I6529 Occlusion and stenosis of unspecified carotid artery: Secondary | ICD-10-CM | POA: Diagnosis not present

## 2012-07-19 ENCOUNTER — Other Ambulatory Visit (HOSPITAL_COMMUNITY): Payer: Self-pay | Admitting: Cardiovascular Disease

## 2012-07-19 DIAGNOSIS — I739 Peripheral vascular disease, unspecified: Secondary | ICD-10-CM

## 2012-07-27 NOTE — Telephone Encounter (Signed)
[facility] faxed confirmation of Zytiga shipment.  Shipped 07-26-2012 with next business day delivery.

## 2012-08-04 ENCOUNTER — Ambulatory Visit (HOSPITAL_BASED_OUTPATIENT_CLINIC_OR_DEPARTMENT_OTHER): Payer: Medicare Other | Admitting: Oncology

## 2012-08-04 ENCOUNTER — Other Ambulatory Visit (HOSPITAL_BASED_OUTPATIENT_CLINIC_OR_DEPARTMENT_OTHER): Payer: Medicare Other | Admitting: Lab

## 2012-08-04 ENCOUNTER — Telehealth: Payer: Self-pay | Admitting: Oncology

## 2012-08-04 VITALS — BP 162/74 | HR 78 | Temp 98.0°F | Resp 18 | Ht 69.0 in | Wt 228.8 lb

## 2012-08-04 DIAGNOSIS — C61 Malignant neoplasm of prostate: Secondary | ICD-10-CM

## 2012-08-04 DIAGNOSIS — C7951 Secondary malignant neoplasm of bone: Secondary | ICD-10-CM

## 2012-08-04 DIAGNOSIS — C7952 Secondary malignant neoplasm of bone marrow: Secondary | ICD-10-CM

## 2012-08-04 LAB — COMPREHENSIVE METABOLIC PANEL (CC13)
ALT: 9 U/L (ref 0–55)
AST: 9 U/L (ref 5–34)
Albumin: 2.9 g/dL — ABNORMAL LOW (ref 3.5–5.0)
Alkaline Phosphatase: 96 U/L (ref 40–150)
BUN: 12 mg/dL (ref 7.0–26.0)
CO2: 28 meq/L (ref 22–29)
Calcium: 9.2 mg/dL (ref 8.4–10.4)
Chloride: 104 meq/L (ref 98–107)
Creatinine: 0.9 mg/dL (ref 0.7–1.3)
Glucose: 109 mg/dL — ABNORMAL HIGH (ref 70–99)
Potassium: 3.8 meq/L (ref 3.5–5.1)
Sodium: 145 meq/L (ref 136–145)
Total Bilirubin: 0.38 mg/dL (ref 0.20–1.20)
Total Protein: 6.9 g/dL (ref 6.4–8.3)

## 2012-08-04 LAB — CBC WITH DIFFERENTIAL/PLATELET
BASO%: 0.7 % (ref 0.0–2.0)
Basophils Absolute: 0.1 10e3/uL (ref 0.0–0.1)
EOS%: 2.4 % (ref 0.0–7.0)
Eosinophils Absolute: 0.2 10e3/uL (ref 0.0–0.5)
HCT: 41.7 % (ref 38.4–49.9)
HGB: 14 g/dL (ref 13.0–17.1)
LYMPH%: 48.3 % (ref 14.0–49.0)
MCH: 30.5 pg (ref 27.2–33.4)
MCHC: 33.6 g/dL (ref 32.0–36.0)
MCV: 90.9 fL (ref 79.3–98.0)
MONO#: 0.8 10e3/uL (ref 0.1–0.9)
MONO%: 9.6 % (ref 0.0–14.0)
NEUT#: 3.1 10e3/uL (ref 1.5–6.5)
NEUT%: 39 % (ref 39.0–75.0)
Platelets: 203 10e3/uL (ref 140–400)
RBC: 4.59 10e6/uL (ref 4.20–5.82)
RDW: 15 % — ABNORMAL HIGH (ref 11.0–14.6)
WBC: 7.9 10e3/uL (ref 4.0–10.3)
lymph#: 3.8 10e3/uL — ABNORMAL HIGH (ref 0.9–3.3)

## 2012-08-04 LAB — PSA: PSA: 1.09 ng/mL

## 2012-08-04 NOTE — Progress Notes (Signed)
Hematology and Oncology Follow Up Visit  Alec Harris [id number] 1946/08/17 66 y.o. 08/04/2012 4:10 PM [name], MDBerry, Delton See, MD   Principle Diagnosis: 66 year old with Castration resistant prostate cancer.  Prior Therapy: He was initially diagnosed in 2003 with an elevated PSA. He underwent a prostatectomy with a pathology that showed prostatic adenocarcinoma. Gleason score 4+4 equals 8, with the tumor involving the margins and the pathologic staging was pT4 N1 with 1 of 2 left pelvic lymph nodes involved. He was started on adjuvant hormone therapy with Lupron. He developed a rising PSA and bone metastasis. He was then treated with ketoconazole and prednisone. He developed a rising PSA with a doubling time of just over 6 months. Bone scan in September 2013 showed increased uptake in the right lower lumbar spine at L4 and L5 and abnormal uptake in the mid sternum which had progressed since the prior bone scan.  Current therapy: Zytiga 1000 mg daily started in October 2013. He remains on Lupron at [facility] urology.  Interim History:  Mr. Camplin returns for a routine visit. He has been on Zytiga since 05/2012 and is tolerating this well. He denies chest pain, shortness of breath, abdominal pain, nausea, vomiting. No lower extremity edema noted. No back pain. No neurological changes. Denies hematuria or other evidence of bleeding. Appetite and weight are stable. No new complications since the last visit. He did lose few lbs since the last visit.   Medications: I have reviewed the patient's current medications. Current outpatient prescriptions:abiraterone Acetate (ZYTIGA) 250 MG tablet, Take 4 tablets (1,000 mg total) by mouth daily. Take on an empty stomach 1 hour before or 2 hours after a meal, Disp: 120 tablet, Rfl: 1;  aspirin 81 MG chewable tablet, Chew 81 mg by mouth daily.  , Disp: , Rfl: ;  Calcium Carb-Cholecalciferol (CALCIUM 500 +D) 500-400 MG-UNIT TABS, Take 1 tablet by mouth 2  (two) times daily., Disp: , Rfl:  cilostazol (PLETAL) 100 MG tablet, Take 100 mg by mouth 2 (two) times daily. , Disp: , Rfl:   Allergies: No Known Allergies  Past Medical History, Surgical history, Social history, and Family History were reviewed and updated.  Review of Systems: Constitutional:  Negative for fever, chills, night sweats, anorexia, weight loss, pain. Cardiovascular: no chest pain or dyspnea on exertion Respiratory: no cough, shortness of breath, or wheezing Neurological: no TIA or stroke symptoms Dermatological: negative ENT: negative Skin: Negative. Gastrointestinal: no abdominal pain, change in bowel habits, or black or bloody stools Genito-Urinary: no dysuria, trouble voiding, or hematuria Hematological and Lymphatic: negative Breast: negative for breast lumps Musculoskeletal: negative Remaining ROS negative.  Physical Exam: Blood pressure 162/74, pulse 78, temperature 98 F (36.7 C), temperature source Oral, resp. rate 18, height 5\' 9"  (1.753 m), weight 228 lb 12.8 oz (103.783 kg). ECOG: 1 General appearance: alert, cooperative and no distress Head: Normocephalic, without obvious abnormality, atraumatic Neck: no adenopathy, no carotid bruit, no JVD, supple, symmetrical, trachea midline and thyroid not enlarged, symmetric, no tenderness/mass/nodules Lymph nodes: Cervical, supraclavicular, and axillary nodes normal. Heart:regular rate and rhythm, S1, S2 normal, no murmur, click, rub or gallop Lung:chest clear, no wheezing, rales, normal symmetric air entry, no tachypnea, retractions or cyanosis Abdomen: soft, non-tender, without masses or organomegaly EXT:no erythema, induration, or nodules   Lab Results: Lab Results  Component Value Date   WBC 7.9 08/04/2012   HGB 14.0 08/04/2012   HCT 41.7 08/04/2012   MCV 90.9 08/04/2012   PLT 203 08/04/2012  Chemistry      Component Value Date/Time   NA 145 08/04/2012 1518   NA 139 04/09/2012 0500   K 3.8  08/04/2012 1518   K 4.0 04/09/2012 0500   CL 104 08/04/2012 1518   CL 106 04/09/2012 0500   CO2 28 08/04/2012 1518   CO2 27 04/09/2012 0500   BUN 12.0 08/04/2012 1518   BUN 12 04/09/2012 0500   CREATININE 0.9 08/04/2012 1518   CREATININE 0.93 04/09/2012 0500      Component Value Date/Time   CALCIUM 9.2 08/04/2012 1518   CALCIUM 8.6 04/09/2012 0500   ALKPHOS 96 08/04/2012 1518   ALKPHOS 70 04/02/2012 0900   AST 9 08/04/2012 1518   AST 13 04/02/2012 0900   ALT 9 08/04/2012 1518   ALT 9 04/02/2012 0900   BILITOT 0.38 08/04/2012 1518   BILITOT 0.2* 04/02/2012 0900     Results for DAEMION, MCNIEL (MRN [id number]) as of 08/04/2012 16:12  Ref. Range 05/27/2012 13:20 07/06/2012 14:06  PSA Latest Range: <=4.00 ng/mL 6.11 (H) 1.23    Impression and Plan:  This is a 66 year-old gentleman with the following issues:  1. Castration resistant prostate cancer. Currently on Zytiga and tolerating it well. I have recommended that he continue Zytiga without dose modification. PSA is pending today. Last PSA dropped to 1.23 from 6.11. 2. Androgen deprivation. He remains on Lupron under the care of Dr. Brunilda Payor. 3. Bone directed therapy. We will address this with him in the future. He has very minimal bone involvement, but we may consider this down the line. Continue calcium and vitamin D supplements. 4. Followup. In about 4-5 weeks.     Benjie Ricketson 12/18/20134:10 PM

## 2012-08-04 NOTE — Telephone Encounter (Signed)
appts made and printed for pt aom °

## 2012-08-05 ENCOUNTER — Telehealth: Payer: Self-pay | Admitting: *Deleted

## 2012-08-05 NOTE — Telephone Encounter (Signed)
Message copied by Reesa Chew on Thu Aug 05, 2012  3:25 PM ------      Message from: Benjiman Core      Created: Thu Aug 05, 2012  8:06 AM       Please call his PSA

## 2012-08-06 ENCOUNTER — Encounter (HOSPITAL_COMMUNITY): Payer: Medicare Other

## 2012-08-09 ENCOUNTER — Telehealth: Payer: Self-pay | Admitting: *Deleted

## 2012-08-09 NOTE — Telephone Encounter (Signed)
08/04/12 PSA WAS 1.09. THIS RESULT WAS GIVEN TO PT.

## 2012-08-11 ENCOUNTER — Ambulatory Visit (INDEPENDENT_AMBULATORY_CARE_PROVIDER_SITE_OTHER): Payer: Medicare Other | Admitting: Family Medicine

## 2012-08-11 VITALS — BP 158/80 | HR 96 | Temp 99.7°F | Resp 17 | Ht 69.0 in | Wt 228.0 lb

## 2012-08-11 DIAGNOSIS — M79643 Pain in unspecified hand: Secondary | ICD-10-CM

## 2012-08-11 DIAGNOSIS — M7989 Other specified soft tissue disorders: Secondary | ICD-10-CM

## 2012-08-11 DIAGNOSIS — M109 Gout, unspecified: Secondary | ICD-10-CM | POA: Diagnosis not present

## 2012-08-11 DIAGNOSIS — M79609 Pain in unspecified limb: Secondary | ICD-10-CM | POA: Diagnosis not present

## 2012-08-11 LAB — POCT CBC
Granulocyte percent: 58 %G (ref 37–80)
MID (cbc): 1 — AB (ref 0–0.9)
MPV: 8.2 fL (ref 0–99.8)
POC Granulocyte: 6.7 (ref 2–6.9)
POC LYMPH PERCENT: 33.7 %L (ref 10–50)
Platelet Count, POC: 238 10*3/uL (ref 142–424)
RDW, POC: 14.8 %

## 2012-08-11 LAB — URIC ACID: Uric Acid, Serum: 3.8 mg/dL — ABNORMAL LOW (ref 4.0–7.8)

## 2012-08-11 MED ORDER — COLCHICINE 0.6 MG PO TABS: ORAL_TABLET | ORAL | Status: DC

## 2012-08-11 MED ORDER — HYDROCODONE-ACETAMINOPHEN 5-500 MG PO TABS: 1.0000 | ORAL_TABLET | Freq: Three times a day (TID) | ORAL | Status: DC | PRN

## 2012-08-11 NOTE — Addendum Note (Signed)
Addended by: Anders Simmonds on: 08/11/2012 01:42 PM   Modules accepted: Orders

## 2012-08-11 NOTE — Progress Notes (Signed)
  Subjective:    Patient ID: Alec Harris, male    DOB: 06-12-1946, 66 y.o.   MRN: [id number]  HPI    Review of Systems     Objective:   Physical Exam        Assessment & Plan:

## 2012-08-11 NOTE — Patient Instructions (Addendum)
It looks like you have gout today.  However, sometimes an infection can appear similar to gout.  If your hand is not getting better within 2 days please let us know- Sooner if worse.   Use the colchicine as directed for gout, you may also use the pain medication as needed.  Be careful of using medications such as aleve or ibuprofen- they can interact with your pletal medication

## 2012-08-11 NOTE — Progress Notes (Signed)
[facility] [address] [place] [address] [id number]  Date:  08/11/2012   Name:  Alec Harris   DOB:  03-Aug-1946   MRN:  [id number]  PCP:  [name], MD    Chief Complaint: Gout   History of Present Illness:  Alec Harris is a 66 y.o. very pleasant male patient who presents with the following:  History of prostate cancer currently under therapy with zytiga since October of this year.  He was originally diagnosed with prostate cancer in 2003 and was treated surgically, but then ended up having a bone mets noted earlier this year.   We treated him for gout here in 2009 with colchicine   He has a history of gout which usually appears in his toes.  He is here today with possible gout in his left hand for the last 2 days. There was no injury to the hand that he is aware of.  It seems like a gout attack to him.  No injury to the hand  No HA, no CP, he is feeling well overall.  His BP is usually ok per his report, and he does not use BP medication.    He has not noted any systemic symptoms such as fever or chills Patient Active Problem List  Diagnosis  . Occlusion and stenosis of carotid artery without mention of cerebral infarction  . Prostate cancer    Past Medical History  Diagnosis Date  . Prostate cancer   . Rectal bleeding 07/24/11    Diverticular Bleeding  . PAD (peripheral artery disease) 12/2011  . Carotid artery disease     By doppler    Past Surgical History  Procedure Date  . Knee cartilage surgery     surgery for a torn ligament  . Cervical spine surgery approx. 10 years ago    disc removed from neck  . Colonoscopy 07/25/2011    Procedure: COLONOSCOPY;  Surgeon: Theda Belfast;  Location: [facility];  Service: Endoscopy;  Laterality: N/A;  . Prostate surgery 2003    for  prostate cancer  . Penile prosthesis implant   . Endarterectomy 04/08/2012    Procedure: ENDARTERECTOMY CAROTID;  Surgeon: Nada Libman, MD;  Location:  [facility] OR;  Service: Vascular;  Laterality: Right;  Right carotid endarterectomy with vascuguard 1cm x 6cm bovine patch angioplasty.     History  Substance Use Topics  . Smoking status: Light Tobacco Smoker -- 50 years    Types: Cigarettes  . Smokeless tobacco: Never Used     Comment: pt states that he is going to quit completely   . Alcohol Use: 6.6 - 7.2 oz/week    10 Cans of beer, 1-2 Shots of liquor per week    Family History  Problem Relation Age of Onset  . Hypertension Mother   . Heart attack Mother   . Heart attack Father   . Cancer Brother     throat    No Known Allergies  Medication list has been reviewed and updated.  Current Outpatient Prescriptions on File Prior to Visit  Medication Sig Dispense Refill  . abiraterone Acetate (ZYTIGA) 250 MG tablet Take 4 tablets (1,000 mg total) by mouth daily. Take on an empty stomach 1 hour before or 2 hours after a meal  120 tablet  1  . aspirin 81 MG chewable tablet Chew 81 mg by mouth daily.        . Calcium Carb-Cholecalciferol (CALCIUM 500 +D) 500-400 MG-UNIT  TABS Take 1 tablet by mouth 2 (two) times daily.      . cilostazol (PLETAL) 100 MG tablet Take 100 mg by mouth 2 (two) times daily.         Review of Systems:  As per HPI- otherwise negative.  Physical Examination: Filed Vitals:   08/11/12 1230  BP: 206/75  Pulse: 96  Temp: 99.7 F (37.6 C)  Resp: 17   Filed Vitals:   08/11/12 1230  Height: 5\' 9"  (1.753 m)  Weight: 228 lb (103.42 kg)   Body mass index is 33.67 kg/(m^2). Ideal Body Weight: Weight in (lb) to have BMI = 25: 168.9   GEN: WDWN, NAD, Non-toxic, A & O x 3, overweight HEENT: Atraumatic, Normocephalic. Neck supple. No masses, No LAD. Ears and Nose: No external deformity. CV: RRR, No M/G/R. No JVD. No thrill. No extra heart sounds. PULM: CTA B, no wheezes, crackles, rhonchi. No retractions. No resp. distress. No accessory muscle use. EXTR: No c/c/ Left hand is swollen and there is mild redness  over the dorsum of the hand.  He has swelling up to his mid- forearm, slight warmth.  No fluctuance, no injury or break in skin.   NEURO Normal gait.  PSYCH: Normally interactive. Conversant. Not depressed or anxious appearing.  Calm demeanor.   Results for orders placed in visit on 08/11/12  POCT CBC      Component Value Range   WBC 11.6 (*) 4.6 - 10.2 K/uL   Lymph, poc 3.9 (*) 0.6 - 3.4   POC LYMPH PERCENT 33.7  10 - 50 %L   MID (cbc) 1.0 (*) 0 - 0.9   POC MID % 8.3  0 - 12 %M   POC Granulocyte 6.7  2 - 6.9   Granulocyte percent 58.0  37 - 80 %G   RBC 4.86  4.69 - 6.13 M/uL   Hemoglobin 14.5  14.1 - 18.1 g/dL   HCT, POC 16.1  09.6 - 53.7 %   MCV 92.4  80 - 97 fL   MCH, POC 29.8  27 - 31.2 pg   MCHC 32.3  31.8 - 35.4 g/dL   RDW, POC 04.5     Platelet Count, POC 238  142 - 424 K/uL   MPV 8.2  0 - 99.8 fL    Assessment and Plan: 1. Hand pain    2. Hand swelling  POCT CBC, Uric Acid  3. Gout  colchicine 0.6 MG tablet, HYDROcodone-acetaminophen (VICODIN) 5-500 MG per tablet   Likely gout.  Await uric acid level.  Treat with colchicine and vicodin as needed.  Cautioned that if he is not getting better he needs to seek re-evaluation right away as he could have an infection instead of gout  Almetta Liddicoat, MD

## 2012-08-12 ENCOUNTER — Telehealth: Payer: Self-pay | Admitting: Family Medicine

## 2012-08-12 ENCOUNTER — Ambulatory Visit (INDEPENDENT_AMBULATORY_CARE_PROVIDER_SITE_OTHER): Payer: Medicare Other | Admitting: Emergency Medicine

## 2012-08-12 VITALS — BP 157/89 | HR 89 | Temp 99.0°F | Resp 20 | Ht 69.0 in | Wt 226.4 lb

## 2012-08-12 DIAGNOSIS — L03119 Cellulitis of unspecified part of limb: Secondary | ICD-10-CM | POA: Diagnosis not present

## 2012-08-12 DIAGNOSIS — L02519 Cutaneous abscess of unspecified hand: Secondary | ICD-10-CM | POA: Diagnosis not present

## 2012-08-12 MED ORDER — CEFTRIAXONE SODIUM 1 G IJ SOLR
1.0000 g | Freq: Once | INTRAMUSCULAR | Status: AC
Start: 2012-08-12 — End: 2012-08-12
  Administered 2012-08-12: 1 g via INTRAMUSCULAR

## 2012-08-12 MED ORDER — CEFTRIAXONE SODIUM 1 G IJ SOLR: 1.0000 g | INTRAMUSCULAR | Status: DC

## 2012-08-12 MED ORDER — SULFAMETHOXAZOLE-TRIMETHOPRIM 800-160 MG PO TABS: 1.0000 | ORAL_TABLET | Freq: Two times a day (BID) | ORAL | Status: DC

## 2012-08-12 NOTE — Telephone Encounter (Signed)
Called to check on him- uric acid level actually a bit low, not high.  He is a little bit better, but not much.  Usually his gout attacks will respond right away to colchicine.  He will RTC today for further evaluation- this may be in infection after all

## 2012-08-12 NOTE — Addendum Note (Signed)
Addended by: Thelma Barge D on: 08/12/2012 03:28 PM   Modules accepted: Orders

## 2012-08-12 NOTE — Telephone Encounter (Signed)
lmoam patient's last PSA level.

## 2012-08-12 NOTE — Progress Notes (Signed)
[facility] [address], [place] [address] [phone]  Date:  08/12/2012   Name:  Alec Harris   DOB:  25-Dec-1945   MRN:  [id number]  PCP:  [name], MD    Chief Complaint: Follow-up   History of Present Illness:  Alec Harris is a 66 y.o. very pleasant male patient who presents with the following:  Seen yesterday with sudden swelling in the left hand with an elevated wbc.  Given some rocephin yesterday and not improved during interval. Still painful and red, swollen and hot.  No fever or chills. No history of injury.  Not on antibiotic.  Taking medication for gout past 24 hours.  Patient Active Problem List  Diagnosis  . Occlusion and stenosis of carotid artery without mention of cerebral infarction  . Prostate cancer    Past Medical History  Diagnosis Date  . Prostate cancer   . Rectal bleeding 07/24/11    Diverticular Bleeding  . PAD (peripheral artery disease) 12/2011  . Carotid artery disease     By doppler    Past Surgical History  Procedure Date  . Knee cartilage surgery     surgery for a torn ligament  . Cervical spine surgery approx. 10 years ago    disc removed from neck  . Colonoscopy 07/25/2011    Procedure: COLONOSCOPY;  Surgeon: Theda Belfast;  Location: [facility] ENDOSCOPY;  Service: Endoscopy;  Laterality: N/A;  . Prostate surgery 2003    for  prostate cancer  . Penile prosthesis implant   . Endarterectomy 04/08/2012    Procedure: ENDARTERECTOMY CAROTID;  Surgeon: Nada Libman, MD;  Location: [facility] OR;  Service: Vascular;  Laterality: Right;  Right carotid endarterectomy with vascuguard 1cm x 6cm bovine patch angioplasty.     History  Substance Use Topics  . Smoking status: Current Every Day Smoker -- 50 years    Types: Cigarettes  . Smokeless tobacco: Never Used     Comment: pt states that he is going to quit completely   . Alcohol Use: 6.6 - 7.2 oz/week    10 Cans of beer, 1-2 Shots of liquor per week    Family  History  Problem Relation Age of Onset  . Hypertension Mother   . Heart attack Mother   . Heart attack Father   . Cancer Brother     throat    No Known Allergies  Medication list has been reviewed and updated.  Current Outpatient Prescriptions on File Prior to Visit  Medication Sig Dispense Refill  . abiraterone Acetate (ZYTIGA) 250 MG tablet Take 4 tablets (1,000 mg total) by mouth daily. Take on an empty stomach 1 hour before or 2 hours after a meal  120 tablet  1  . aspirin 81 MG chewable tablet Chew 81 mg by mouth daily.        . Calcium Carb-Cholecalciferol (CALCIUM 500 +D) 500-400 MG-UNIT TABS Take 1 tablet by mouth 2 (two) times daily.      . cilostazol (PLETAL) 100 MG tablet Take 100 mg by mouth 2 (two) times daily.       . colchicine 0.6 MG tablet Take 2 tablets, then an hour later take one more.  May take 1 daily as needed for gout prevention or treatment  30 tablet  0  . HYDROcodone-acetaminophen (VICODIN) 5-500 MG per tablet Take 1 tablet by mouth every 8 (eight) hours as needed for pain.  20 tablet  0    Review  of Systems:  As per HPI, otherwise negative.    Physical Examination: Filed Vitals:   08/12/12 1447  BP: 157/89  Pulse: 89  Temp: 99 F (37.2 C)  Resp: 20   Filed Vitals:   08/12/12 1447  Height: 5\' 9"  (1.753 m)  Weight: 226 lb 6.4 oz (102.694 kg)   Body mass index is 33.43 kg/(m^2). Ideal Body Weight: Weight in (lb) to have BMI = 25: 168.9    GEN: WDWN, NAD, Non-toxic, Alert & Oriented x 3 HEENT: Atraumatic, Normocephalic.  Ears and Nose: No external deformity. EXTR: No clubbing/cyanosis/edema NEURO: Normal gait.  PSYCH: Normally interactive. Conversant. Not depressed or anxious appearing.  Calm demeanor.  Left hand:  moderate cellulitis in hand.  Marked dorsal had swelling and some moderate generalized tenderness.  No lymphangitis.  No crepitus or evidence tenosynovitis.  Assessment and Plan: Soft tissue infection hand redose with  rocephin Start septra and warm soaks Follow up tomorrow if not improved  Carmelina Dane, MD

## 2012-08-13 ENCOUNTER — Telehealth: Payer: Self-pay | Admitting: Family Medicine

## 2012-08-13 ENCOUNTER — Other Ambulatory Visit: Payer: Self-pay | Admitting: *Deleted

## 2012-08-13 NOTE — Telephone Encounter (Signed)
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 

## 2012-08-13 NOTE — Telephone Encounter (Signed)
Message copied by Maryann Alar on Fri Aug 13, 2012 11:39 AM ------      Message from: Abbe Amsterdam C      Created: Fri Aug 13, 2012 11:33 AM       Please check on how he is doing

## 2012-08-13 NOTE — Telephone Encounter (Signed)
Spoke to pt, he is doing better, inflamation has decreased  In size, and he can move his finger apx half way now, zero pain.

## 2012-08-16 ENCOUNTER — Telehealth: Payer: Self-pay | Admitting: Oncology

## 2012-08-16 ENCOUNTER — Other Ambulatory Visit: Payer: Self-pay | Admitting: *Deleted

## 2012-08-16 DIAGNOSIS — C61 Malignant neoplasm of prostate: Secondary | ICD-10-CM

## 2012-08-16 MED ORDER — ABIRATERONE ACETATE 250 MG PO TABS: 1000.0000 mg | ORAL_TABLET | Freq: Every day | ORAL | Status: DC

## 2012-08-16 NOTE — Telephone Encounter (Signed)
s.w. pt and advised that K.C. is out of the office...moved appt to 1.21.14.Marland KitchenMarland Kitchenpt ok and awae

## 2012-08-17 ENCOUNTER — Encounter: Payer: Self-pay | Admitting: *Deleted

## 2012-08-17 NOTE — Progress Notes (Unsigned)
No note

## 2012-08-24 ENCOUNTER — Encounter: Payer: Self-pay | Admitting: *Deleted

## 2012-08-24 NOTE — Progress Notes (Signed)
RECEIVED A FAX FROM [facility] CONCERNING A CONFIRMATION OF PRESCRIPTION SHIPMENT FOR ZYTIGA ON 08/23/12.

## 2012-08-30 ENCOUNTER — Encounter (HOSPITAL_COMMUNITY): Payer: Medicare Other

## 2012-09-02 ENCOUNTER — Ambulatory Visit: Payer: Medicare Other | Admitting: Oncology

## 2012-09-02 ENCOUNTER — Other Ambulatory Visit: Payer: Medicare Other | Admitting: Lab

## 2012-09-07 ENCOUNTER — Encounter: Payer: Self-pay | Admitting: Oncology

## 2012-09-07 ENCOUNTER — Other Ambulatory Visit (HOSPITAL_BASED_OUTPATIENT_CLINIC_OR_DEPARTMENT_OTHER): Payer: Medicare Other | Admitting: Lab

## 2012-09-07 ENCOUNTER — Ambulatory Visit (HOSPITAL_BASED_OUTPATIENT_CLINIC_OR_DEPARTMENT_OTHER): Payer: Medicare Other | Admitting: Oncology

## 2012-09-07 ENCOUNTER — Telehealth: Payer: Self-pay | Admitting: Oncology

## 2012-09-07 VITALS — BP 177/80 | HR 80 | Temp 97.2°F | Resp 20 | Ht 69.0 in | Wt 227.1 lb

## 2012-09-07 DIAGNOSIS — C7951 Secondary malignant neoplasm of bone: Secondary | ICD-10-CM | POA: Diagnosis not present

## 2012-09-07 DIAGNOSIS — C7952 Secondary malignant neoplasm of bone marrow: Secondary | ICD-10-CM

## 2012-09-07 DIAGNOSIS — C61 Malignant neoplasm of prostate: Secondary | ICD-10-CM

## 2012-09-07 LAB — CBC WITH DIFFERENTIAL/PLATELET
BASO%: 0.4 % (ref 0.0–2.0)
Eosinophils Absolute: 0.1 10*3/uL (ref 0.0–0.5)
LYMPH%: 49 % (ref 14.0–49.0)
MCHC: 33.4 g/dL (ref 32.0–36.0)
MONO#: 0.7 10*3/uL (ref 0.1–0.9)
NEUT#: 2.9 10*3/uL (ref 1.5–6.5)
RBC: 4.52 10*6/uL (ref 4.20–5.82)
RDW: 15 % — ABNORMAL HIGH (ref 11.0–14.6)
WBC: 7.4 10*3/uL (ref 4.0–10.3)
lymph#: 3.6 10*3/uL — ABNORMAL HIGH (ref 0.9–3.3)

## 2012-09-07 LAB — COMPREHENSIVE METABOLIC PANEL (CC13)
ALT: 7 U/L (ref 0–55)
AST: 8 U/L (ref 5–34)
Albumin: 3 g/dL — ABNORMAL LOW (ref 3.5–5.0)
Alkaline Phosphatase: 93 U/L (ref 40–150)
BUN: 11 mg/dL (ref 7.0–26.0)
CO2: 27 mEq/L (ref 22–29)
Calcium: 9.3 mg/dL (ref 8.4–10.4)
Chloride: 105 mEq/L (ref 98–107)
Creatinine: 0.9 mg/dL (ref 0.7–1.3)
Glucose: 106 mg/dl — ABNORMAL HIGH (ref 70–99)
Potassium: 3.9 mEq/L (ref 3.5–5.1)
Sodium: 140 mEq/L (ref 136–145)
Total Bilirubin: 0.55 mg/dL (ref 0.20–1.20)
Total Protein: 6.9 g/dL (ref 6.4–8.3)

## 2012-09-07 LAB — PSA: PSA: 0.93 ng/mL (ref <=4.00)

## 2012-09-07 NOTE — Telephone Encounter (Signed)
gv and printed appt schedule for opt for Feb....the patient aware

## 2012-09-07 NOTE — Progress Notes (Signed)
Hematology and Oncology Follow Up Visit  Alec Harris [id number] November 05, 1945 67 y.o. 09/07/2012 2:51 PM Alec Harris, MDBerry, Alec See, MD   Principle Diagnosis: 67 year old with Castration resistant prostate cancer.  Prior Therapy: He was initially diagnosed in 2003 with an elevated PSA. He underwent a prostatectomy with a pathology that showed prostatic adenocarcinoma. Gleason score 4+4 equals 8, with the tumor involving the margins and the pathologic staging was pT4 N1 with 1 of 2 left pelvic lymph nodes involved. He was started on adjuvant hormone therapy with Lupron. He developed a rising PSA and bone metastasis. He was then treated with ketoconazole and prednisone. He developed a rising PSA with a doubling time of just over 6 months. Bone scan in September 2013 showed increased uptake in the right lower lumbar spine at L4 and L5 and abnormal uptake in the mid sternum which had progressed since the prior bone scan.  Current therapy: Zytiga 1000 mg daily started in October 2013. He remains on Lupron at [facility] urology.  Interim History:  Alec Harris returns for a routine visit. He has been on Zytiga since 05/2012 and is tolerating this well. He denies chest pain, shortness of breath, abdominal pain, nausea, vomiting. No lower extremity edema noted. No back pain. No neurological changes. Denies hematuria or other evidence of bleeding. Appetite and weight are stable. Treated for cellulitis of the right hand since his last visit. Now resolved with antibiotics. Appetite and weight stable.  Medications: I have reviewed the patient's current medications. Current outpatient prescriptions:abiraterone Acetate (ZYTIGA) 250 MG tablet, Take 4 tablets (1,000 mg total) by mouth daily. Take on an empty stomach 1 hour before or 2 hours after a meal, Disp: 120 tablet, Rfl: 0;  aspirin 81 MG chewable tablet, Chew 81 mg by mouth daily.  , Disp: , Rfl: ;  Calcium Carb-Cholecalciferol (CALCIUM 500 +D) 500-400  MG-UNIT TABS, Take 1 tablet by mouth 2 (two) times daily., Disp: , Rfl:  cilostazol (PLETAL) 100 MG tablet, Take 100 mg by mouth 2 (two) times daily. , Disp: , Rfl:   Allergies: No Known Allergies  Past Medical History, Surgical history, Social history, and Family History were reviewed and updated.  Review of Systems: Constitutional:  Negative for fever, chills, night sweats, anorexia, weight loss, pain. Cardiovascular: no chest pain or dyspnea on exertion Respiratory: no cough, shortness of breath, or wheezing Neurological: no TIA or stroke symptoms Dermatological: negative ENT: negative Skin: Negative. Gastrointestinal: no abdominal pain, change in bowel habits, or black or bloody stools Genito-Urinary: no dysuria, trouble voiding, or hematuria Hematological and Lymphatic: negative Breast: negative for breast lumps Musculoskeletal: negative Remaining ROS negative.  Physical Exam: Blood pressure 177/80, pulse 80, temperature 97.2 F (36.2 C), temperature source Oral, resp. rate 20, height 5\' 9"  (1.753 m), weight 227 lb 1.6 oz (103.012 kg). ECOG: 1 General appearance: alert, cooperative and no distress Head: Normocephalic, without obvious abnormality, atraumatic Neck: no adenopathy, no carotid bruit, no JVD, supple, symmetrical, trachea midline and thyroid not enlarged, symmetric, no tenderness/mass/nodules Lymph nodes: Cervical, supraclavicular, and axillary nodes normal. Heart:regular rate and rhythm, S1, S2 normal, no murmur, click, rub or gallop Lung:chest clear, no wheezing, rales, normal symmetric air entry, no tachypnea, retractions or cyanosis Abdomen: soft, non-tender, without masses or organomegaly EXT:no erythema, induration, or nodules   Lab Results: Lab Results  Component Value Date   WBC 7.4 09/07/2012   HGB 13.6 09/07/2012   HCT 40.8 09/07/2012   MCV 90.3 09/07/2012   PLT 182  09/07/2012     Chemistry      Component Value Date/Time   NA 140 09/07/2012 1115    NA 139 04/09/2012 0500   K 3.9 09/07/2012 1115   K 4.0 04/09/2012 0500   CL 105 09/07/2012 1115   CL 106 04/09/2012 0500   CO2 27 09/07/2012 1115   CO2 27 04/09/2012 0500   BUN 11.0 09/07/2012 1115   BUN 12 04/09/2012 0500   CREATININE 0.9 09/07/2012 1115   CREATININE 0.93 04/09/2012 0500      Component Value Date/Time   CALCIUM 9.3 09/07/2012 1115   CALCIUM 8.6 04/09/2012 0500   ALKPHOS 93 09/07/2012 1115   ALKPHOS 70 04/02/2012 0900   AST 8 09/07/2012 1115   AST 13 04/02/2012 0900   ALT 7 09/07/2012 1115   ALT 9 04/02/2012 0900   BILITOT 0.55 09/07/2012 1115   BILITOT 0.2* 04/02/2012 0900     Results for COYT, GOVONI (MRN [id number]) as of 09/07/2012 11:23  Ref. Range 05/27/2012 13:20 07/06/2012 14:06 08/04/2012 15:18  PSA Latest Range: <=4.00 ng/mL 6.11 (H) 1.23 1.09   Impression and Plan:  This is a 67 year-old gentleman with the following issues:  1. Castration resistant prostate cancer. Currently on Zytiga and tolerating it well. I have recommended that he continue Zytiga without dose modification. PSA is pending today. Last PSA dropped to 1.09 from 6.11. 2. Androgen deprivation. He remains on Lupron under the care of Dr. Brunilda Harris. Due February 2014. 3. Bone directed therapy. We will address this with him in the future. He has very minimal bone involvement, but we may consider this down the line. Continue calcium and vitamin D supplements. 4. Followup. In about 4-5 weeks.     Alec Harris 1/21/20142:51 PM

## 2012-09-07 NOTE — Patient Instructions (Signed)
Results for Alec Harris, Alec Harris (MRN [id number]) as of 09/07/2012 11:23  Ref. Range 05/27/2012 13:20 07/06/2012 14:06 08/04/2012 15:18  PSA Latest Range: <=4.00 ng/mL 6.11 (H) 1.23 1.09

## 2012-09-08 ENCOUNTER — Telehealth: Payer: Self-pay | Admitting: *Deleted

## 2012-09-08 NOTE — Telephone Encounter (Signed)
Gave patient results of PSA done on 09/07/12

## 2012-09-08 NOTE — Telephone Encounter (Signed)
Message copied by Reesa Chew on Wed Sep 08, 2012 10:45 AM ------      Message from: Benjiman Core      Created: Wed Sep 08, 2012  8:28 AM       Please call his PSA

## 2012-09-16 ENCOUNTER — Other Ambulatory Visit: Payer: Self-pay | Admitting: *Deleted

## 2012-09-16 DIAGNOSIS — C61 Malignant neoplasm of prostate: Secondary | ICD-10-CM

## 2012-09-16 NOTE — Telephone Encounter (Signed)
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 

## 2012-09-21 MED ORDER — ABIRATERONE ACETATE 250 MG PO TABS: 1000.0000 mg | ORAL_TABLET | Freq: Every day | ORAL | Status: DC

## 2012-09-21 NOTE — Addendum Note (Signed)
Addended by: Arvilla Meres on: 09/21/2012 02:31 PM   Modules accepted: Orders

## 2012-09-23 NOTE — Telephone Encounter (Signed)
RECEIVED A FAX FROM [facility] CONCERNING A CONFIRMATION OF PRESCRIPTION SHIPMENT FOR ZYTIGA ON 09/22/12.

## 2012-10-05 ENCOUNTER — Ambulatory Visit (HOSPITAL_BASED_OUTPATIENT_CLINIC_OR_DEPARTMENT_OTHER): Payer: Medicare Other | Admitting: Oncology

## 2012-10-05 ENCOUNTER — Telehealth: Payer: Self-pay | Admitting: Oncology

## 2012-10-05 ENCOUNTER — Other Ambulatory Visit (HOSPITAL_BASED_OUTPATIENT_CLINIC_OR_DEPARTMENT_OTHER): Payer: Medicare Other | Admitting: Lab

## 2012-10-05 VITALS — BP 151/73 | HR 86 | Temp 97.8°F | Wt 223.5 lb

## 2012-10-05 DIAGNOSIS — C61 Malignant neoplasm of prostate: Secondary | ICD-10-CM

## 2012-10-05 DIAGNOSIS — C7952 Secondary malignant neoplasm of bone marrow: Secondary | ICD-10-CM | POA: Diagnosis not present

## 2012-10-05 DIAGNOSIS — C7951 Secondary malignant neoplasm of bone: Secondary | ICD-10-CM | POA: Diagnosis not present

## 2012-10-05 LAB — CBC WITH DIFFERENTIAL/PLATELET
BASO%: 0.6 % (ref 0.0–2.0)
EOS%: 2.6 % (ref 0.0–7.0)
HCT: 38.5 % (ref 38.4–49.9)
LYMPH%: 46.2 % (ref 14.0–49.0)
MCH: 30.5 pg (ref 27.2–33.4)
MCHC: 34 g/dL (ref 32.0–36.0)
NEUT%: 41.8 % (ref 39.0–75.0)
Platelets: 189 10*3/uL (ref 140–400)
RBC: 4.29 10*6/uL (ref 4.20–5.82)
WBC: 7.8 10*3/uL (ref 4.0–10.3)
lymph#: 3.6 10*3/uL — ABNORMAL HIGH (ref 0.9–3.3)

## 2012-10-05 LAB — COMPREHENSIVE METABOLIC PANEL (CC13)
Albumin: 2.9 g/dL — ABNORMAL LOW (ref 3.5–5.0)
BUN: 11.3 mg/dL (ref 7.0–26.0)
CO2: 26 mEq/L (ref 22–29)
Calcium: 9.1 mg/dL (ref 8.4–10.4)
Chloride: 106 mEq/L (ref 98–107)
Creatinine: 0.9 mg/dL (ref 0.7–1.3)
Potassium: 3.8 mEq/L (ref 3.5–5.1)

## 2012-10-05 LAB — PSA: PSA: 0.68 ng/mL (ref <=4.00)

## 2012-10-05 NOTE — Progress Notes (Signed)
Hematology and Oncology Follow Up Visit  Kalel Harty [id number] 14-Oct-1945 67 y.o. 10/05/2012 1:31 PM [name], MDBerry, Delton See, MD   Principle Diagnosis: 67 year old with Castration resistant prostate cancer.  Prior Therapy: He was initially diagnosed in 2003 with an elevated PSA. He underwent a prostatectomy with a pathology that showed prostatic adenocarcinoma. Gleason score 4+4 equals 8, with the tumor involving the margins and the pathologic staging was pT4 N1 with 1 of 2 left pelvic lymph nodes involved. He was started on adjuvant hormone therapy with Lupron. He developed a rising PSA and bone metastasis. He was then treated with ketoconazole and prednisone. He developed a rising PSA with a doubling time of just over 6 months. Bone scan in September 2013 showed increased uptake in the right lower lumbar spine at L4 and L5 and abnormal uptake in the mid sternum which had progressed since the prior bone scan.  Current therapy: Zytiga 1000 mg daily started in October 2013. He remains on Lupron at [facility] urology.  Interim History:  Mr. Harriger returns for a routine visit. He has been on Zytiga since 05/2012 and is tolerating this well. He denies chest pain, shortness of breath, abdominal pain, nausea, vomiting. No lower extremity edema noted. No back pain. No neurological changes. Denies hematuria or other evidence of bleeding. Appetite and weight are stable.  Appetite and weight stable. He is not reporting any new complications with Zytiga.   Medications: I have reviewed the patient's current medications. Current outpatient prescriptions:abiraterone Acetate (ZYTIGA) 250 MG tablet, Take 4 tablets (1,000 mg total) by mouth daily. Take on an empty stomach 1 hour before or 2 hours after a meal, Disp: 120 tablet, Rfl: 0;  aspirin 81 MG chewable tablet, Chew 81 mg by mouth daily.  , Disp: , Rfl: ;  Calcium Carb-Cholecalciferol (CALCIUM 500 +D) 500-400 MG-UNIT TABS, Take 1 tablet by mouth 2  (two) times daily., Disp: , Rfl:  cilostazol (PLETAL) 100 MG tablet, Take 100 mg by mouth 2 (two) times daily. , Disp: , Rfl:   Allergies: No Known Allergies  Past Medical History, Surgical history, Social history, and Family History were reviewed and updated.  Review of Systems: Constitutional:  Negative for fever, chills, night sweats, anorexia, weight loss, pain. Cardiovascular: no chest pain or dyspnea on exertion Respiratory: no cough, shortness of breath, or wheezing Neurological: no TIA or stroke symptoms Dermatological: negative ENT: negative Skin: Negative. Gastrointestinal: no abdominal pain, change in bowel habits, or black or bloody stools Genito-Urinary: no dysuria, trouble voiding, or hematuria Hematological and Lymphatic: negative Breast: negative for breast lumps Musculoskeletal: negative Remaining ROS negative.  Physical Exam: There were no vitals taken for this visit. ECOG: 1 General appearance: alert, cooperative and no distress Head: Normocephalic, without obvious abnormality, atraumatic Neck: no adenopathy, no carotid bruit, no JVD, supple, symmetrical, trachea midline and thyroid not enlarged, symmetric, no tenderness/mass/nodules Lymph nodes: Cervical, supraclavicular, and axillary nodes normal. Heart:regular rate and rhythm, S1, S2 normal, no murmur, click, rub or gallop Lung:chest clear, no wheezing, rales, normal symmetric air entry, no tachypnea, retractions or cyanosis Abdomen: soft, non-tender, without masses or organomegaly EXT:no erythema, induration, or nodules   Lab Results: Lab Results  Component Value Date   WBC 7.8 10/05/2012   HGB 13.1 10/05/2012   HCT 38.5 10/05/2012   MCV 89.7 10/05/2012   PLT 189 10/05/2012     Chemistry      Component Value Date/Time   NA 140 09/07/2012 1115   NA 139  04/09/2012 0500   K 3.9 09/07/2012 1115   K 4.0 04/09/2012 0500   CL 105 09/07/2012 1115   CL 106 04/09/2012 0500   CO2 27 09/07/2012 1115   CO2 27  04/09/2012 0500   BUN 11.0 09/07/2012 1115   BUN 12 04/09/2012 0500   CREATININE 0.9 09/07/2012 1115   CREATININE 0.93 04/09/2012 0500      Component Value Date/Time   CALCIUM 9.3 09/07/2012 1115   CALCIUM 8.6 04/09/2012 0500   ALKPHOS 93 09/07/2012 1115   ALKPHOS 70 04/02/2012 0900   AST 8 09/07/2012 1115   AST 13 04/02/2012 0900   ALT 7 09/07/2012 1115   ALT 9 04/02/2012 0900   BILITOT 0.55 09/07/2012 1115   BILITOT 0.2* 04/02/2012 0900     Results for CID, AGENA (MRN [id number]) as of 10/05/2012 13:32  Ref. Range 08/04/2012 15:18 09/07/2012 11:15  PSA Latest Range: <=4.00 ng/mL 1.09 0.93    Impression and Plan:  This is a 67 year-old gentleman with the following issues:  1. Castration resistant prostate cancer. Currently on Zytiga and tolerating it well. I have recommended that he continue Zytiga without dose modification. PSA is pending today. Last PSA dropped to 0.93 from 6.11. 2. Androgen deprivation. He remains on Lupron under the care of Dr. Brunilda Payor. Due February 2014. 3. Bone directed therapy. We will address this with him in the future. He has very minimal bone involvement, but we may consider this down the line. Continue calcium and vitamin D supplements. 4. Followup. In about 4-5 weeks.     Fatin Bachicha 2/18/20141:31 PM

## 2012-10-05 NOTE — Telephone Encounter (Signed)
Gave pt appt for lab and ML on March 2014 °

## 2012-10-06 ENCOUNTER — Telehealth: Payer: Self-pay | Admitting: *Deleted

## 2012-10-06 NOTE — Telephone Encounter (Signed)
Message copied by Reesa Chew on Wed Oct 06, 2012 10:30 AM ------      Message from: Clenton Pare R      Created: Wed Oct 06, 2012 10:13 AM       Please call pt with PSA result ------

## 2012-10-06 NOTE — Telephone Encounter (Signed)
Called patient with psa results done yesterday.

## 2012-10-20 ENCOUNTER — Telehealth: Payer: Self-pay | Admitting: *Deleted

## 2012-10-20 NOTE — Telephone Encounter (Signed)
[facility] faxed confirmation of facsimile receipt for this referral.

## 2012-10-25 ENCOUNTER — Encounter: Payer: Self-pay | Admitting: *Deleted

## 2012-10-25 NOTE — Progress Notes (Signed)
RECEIVED A FAX FROM [facility] CONCERNING A CONFIRMATION OF PRESCRIPTION SHIPMENT FOR ZYTIGA ON 10/22/12.

## 2012-11-08 ENCOUNTER — Ambulatory Visit (HOSPITAL_BASED_OUTPATIENT_CLINIC_OR_DEPARTMENT_OTHER): Payer: Medicare Other | Admitting: Oncology

## 2012-11-08 ENCOUNTER — Telehealth: Payer: Self-pay | Admitting: Oncology

## 2012-11-08 ENCOUNTER — Other Ambulatory Visit (HOSPITAL_BASED_OUTPATIENT_CLINIC_OR_DEPARTMENT_OTHER): Payer: Medicare Other

## 2012-11-08 ENCOUNTER — Encounter: Payer: Self-pay | Admitting: Oncology

## 2012-11-08 VITALS — BP 183/80 | HR 78 | Temp 98.4°F | Resp 20 | Ht 69.0 in | Wt 225.9 lb

## 2012-11-08 DIAGNOSIS — C61 Malignant neoplasm of prostate: Secondary | ICD-10-CM

## 2012-11-08 DIAGNOSIS — E291 Testicular hypofunction: Secondary | ICD-10-CM

## 2012-11-08 DIAGNOSIS — C7951 Secondary malignant neoplasm of bone: Secondary | ICD-10-CM | POA: Diagnosis not present

## 2012-11-08 DIAGNOSIS — C7952 Secondary malignant neoplasm of bone marrow: Secondary | ICD-10-CM | POA: Diagnosis not present

## 2012-11-08 LAB — CBC WITH DIFFERENTIAL/PLATELET
Basophils Absolute: 0.1 10*3/uL (ref 0.0–0.1)
EOS%: 2.1 % (ref 0.0–7.0)
HGB: 13.4 g/dL (ref 13.0–17.1)
MCH: 31.2 pg (ref 27.2–33.4)
MONO%: 8.5 % (ref 0.0–14.0)
NEUT#: 2.5 10*3/uL (ref 1.5–6.5)
RBC: 4.3 10*6/uL (ref 4.20–5.82)
RDW: 14.7 % — ABNORMAL HIGH (ref 11.0–14.6)
lymph#: 3.8 10*3/uL — ABNORMAL HIGH (ref 0.9–3.3)

## 2012-11-08 LAB — COMPREHENSIVE METABOLIC PANEL (CC13)
ALT: 9 U/L (ref 0–55)
AST: 10 U/L (ref 5–34)
Albumin: 3 g/dL — ABNORMAL LOW (ref 3.5–5.0)
Alkaline Phosphatase: 91 U/L (ref 40–150)
BUN: 12.5 mg/dL (ref 7.0–26.0)
Calcium: 9 mg/dL (ref 8.4–10.4)
Chloride: 107 mEq/L (ref 98–107)
Potassium: 3.9 mEq/L (ref 3.5–5.1)
Sodium: 144 mEq/L (ref 136–145)
Total Protein: 6.5 g/dL (ref 6.4–8.3)

## 2012-11-08 NOTE — Progress Notes (Signed)
Hematology and Oncology Follow Up Visit  Alec Harris [id number] [birth year] 67 y.o. 11/08/2012 2:22 PM Alec Harris, MDBerry, Alec See, MD   Principle Diagnosis: 67 year old with Castration resistant prostate cancer.  Prior Therapy: He was initially diagnosed in 2003 with an elevated PSA. He underwent a prostatectomy with a pathology that showed prostatic adenocarcinoma. Gleason score 4+4 equals 8, with the tumor involving the margins and the pathologic staging was pT4 N1 with 1 of 2 left pelvic lymph nodes involved. He was started on adjuvant hormone therapy with Lupron. He developed a rising PSA and bone metastasis. He was then treated with ketoconazole and prednisone. He developed a rising PSA with a doubling time of just over 6 months. Bone scan in September 2013 showed increased uptake in the right lower lumbar spine at L4 and L5 and abnormal uptake in the mid sternum which had progressed since the prior bone scan.  Current therapy: Zytiga 1000 mg daily started in October 2013. He remains on Lupron at [facility] urology.  Interim History:  Alec Harris returns for a routine visit. He has been on Zytiga since 05/2012 and is tolerating this well. He denies chest pain, shortness of breath, abdominal pain, nausea, vomiting. No lower extremity edema noted. No back pain. No neurological changes. Denies hematuria or other evidence of bleeding. Appetite and weight are stable.  Appetite and weight stable. He is not reporting any new complications with Zytiga.   Medications: I have reviewed the patient's current medications. Current outpatient prescriptions:abiraterone Acetate (ZYTIGA) 250 MG tablet, Take 4 tablets (1,000 mg total) by mouth daily. Take on an empty stomach 1 hour before or 2 hours after a meal, Disp: 120 tablet, Rfl: 0;  aspirin 81 MG chewable tablet, Chew 81 mg by mouth daily.  , Disp: , Rfl: ;  Calcium Carb-Cholecalciferol (CALCIUM 500 +D) 500-400 MG-UNIT TABS, Take 1 tablet by mouth 2  (two) times daily., Disp: , Rfl:  cilostazol (PLETAL) 100 MG tablet, Take 100 mg by mouth 2 (two) times daily. , Disp: , Rfl:   Allergies: No Known Allergies  Past Medical History, Surgical history, Social history, and Family History were reviewed and updated.  Review of Systems: Constitutional:  Negative for fever, chills, night sweats, anorexia, weight loss, pain. Cardiovascular: no chest pain or dyspnea on exertion Respiratory: no cough, shortness of breath, or wheezing Neurological: no TIA or stroke symptoms Dermatological: negative ENT: negative Skin: Negative. Gastrointestinal: no abdominal pain, change in bowel habits, or black or bloody stools Genito-Urinary: no dysuria, trouble voiding, or hematuria Hematological and Lymphatic: negative Breast: negative for breast lumps Musculoskeletal: negative Remaining ROS negative.  Physical Exam: Blood pressure 183/80, pulse 78, temperature 98.4 F (36.9 C), temperature source Oral, resp. rate 20, height 5\' 9"  (1.753 m), weight 225 lb 14.4 oz (102.468 kg). ECOG: 1 General appearance: alert, cooperative and no distress Head: Normocephalic, without obvious abnormality, atraumatic Neck: no adenopathy, no carotid bruit, no JVD, supple, symmetrical, trachea midline and thyroid not enlarged, symmetric, no tenderness/mass/nodules Lymph nodes: Cervical, supraclavicular, and axillary nodes normal. Heart:regular rate and rhythm, S1, S2 normal, no murmur, click, rub or gallop Lung:chest clear, no wheezing, rales, normal symmetric air entry, no tachypnea, retractions or cyanosis Abdomen: soft, non-tender, without masses or organomegaly EXT:no erythema, induration, or nodules   Lab Results: Lab Results  Component Value Date   WBC 7.1 11/08/2012   HGB 13.4 11/08/2012   HCT 38.9 11/08/2012   MCV 90.6 11/08/2012   PLT 167 11/08/2012  Chemistry      Component Value Date/Time   NA 144 11/08/2012 1313   NA 139 04/09/2012 0500   K 3.9  11/08/2012 1313   K 4.0 04/09/2012 0500   CL 107 11/08/2012 1313   CL 106 04/09/2012 0500   CO2 27 11/08/2012 1313   CO2 27 04/09/2012 0500   BUN 12.5 11/08/2012 1313   BUN 12 04/09/2012 0500   CREATININE 1.0 11/08/2012 1313   CREATININE 0.93 04/09/2012 0500      Component Value Date/Time   CALCIUM 9.0 11/08/2012 1313   CALCIUM 8.6 04/09/2012 0500   ALKPHOS 91 11/08/2012 1313   ALKPHOS 70 04/02/2012 0900   AST 10 11/08/2012 1313   AST 13 04/02/2012 0900   ALT 9 11/08/2012 1313   ALT 9 04/02/2012 0900   BILITOT 0.60 11/08/2012 1313   BILITOT 0.2* 04/02/2012 0900     Results for Alec, Harris (MRN [id number]) as of 11/08/2012 13:28  Ref. Range 05/27/2012 13:20 07/06/2012 14:06 08/04/2012 15:18 09/07/2012 11:15 10/05/2012 12:57  PSA Latest Range: <=4.00 ng/mL 6.11 (H) 1.23 1.09 0.93 0.68    Impression and Plan:  This is a 67 year-old gentleman with the following issues:  1. Castration resistant prostate cancer. Currently on Zytiga and tolerating it well. I have recommended that he continue Zytiga without dose modification. PSA is pending today. Last PSA dropped to 0.68 from 6.11. 2. Androgen deprivation. He remains on Lupron under the care of Dr. Brunilda Harris. Due February 2014. 3. Bone directed therapy. We will address this with him in the future. He has very minimal bone involvement, but we may consider this down the line. Continue calcium and vitamin D supplements. 4. Followup. In about 4-5 weeks.     Alec Harris 3/24/20142:22 PM

## 2012-11-08 NOTE — Patient Instructions (Signed)
Results for Alec Harris, Alec Harris (MRN [id number]) as of 11/08/2012 13:28  Ref. Range 05/27/2012 13:20 07/06/2012 14:06 08/04/2012 15:18 09/07/2012 11:15 10/05/2012 12:57  PSA Latest Range: <=4.00 ng/mL 6.11 (H) 1.23 1.09 0.93 0.68

## 2012-11-08 NOTE — Telephone Encounter (Signed)
gv and printed appt schedule for pt; for May...pt goin out of town and needed later d/t for app

## 2012-12-17 ENCOUNTER — Other Ambulatory Visit: Payer: Self-pay | Admitting: *Deleted

## 2012-12-17 DIAGNOSIS — C61 Malignant neoplasm of prostate: Secondary | ICD-10-CM

## 2012-12-17 MED ORDER — ABIRATERONE ACETATE 250 MG PO TABS: 1000.0000 mg | ORAL_TABLET | Freq: Every day | ORAL | Status: DC

## 2012-12-17 NOTE — Telephone Encounter (Signed)
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 

## 2012-12-17 NOTE — Telephone Encounter (Signed)
RECEIVED A FAX FROM [facility] CONCERNING A CONFIRMATION OF FACSIMILE RECEIPT FOR PT. REFERRAL. 

## 2012-12-19 ENCOUNTER — Encounter: Payer: Self-pay | Admitting: *Deleted

## 2012-12-21 ENCOUNTER — Ambulatory Visit (HOSPITAL_BASED_OUTPATIENT_CLINIC_OR_DEPARTMENT_OTHER): Payer: Medicare Other | Admitting: Oncology

## 2012-12-21 ENCOUNTER — Telehealth: Payer: Self-pay | Admitting: Oncology

## 2012-12-21 ENCOUNTER — Encounter: Payer: Self-pay | Admitting: Internal Medicine

## 2012-12-21 ENCOUNTER — Other Ambulatory Visit (HOSPITAL_BASED_OUTPATIENT_CLINIC_OR_DEPARTMENT_OTHER): Payer: Medicare Other | Admitting: Lab

## 2012-12-21 VITALS — BP 188/69 | HR 100 | Temp 98.5°F | Resp 18 | Ht 69.0 in | Wt 225.0 lb

## 2012-12-21 DIAGNOSIS — C7951 Secondary malignant neoplasm of bone: Secondary | ICD-10-CM

## 2012-12-21 DIAGNOSIS — R030 Elevated blood-pressure reading, without diagnosis of hypertension: Secondary | ICD-10-CM | POA: Diagnosis not present

## 2012-12-21 DIAGNOSIS — C61 Malignant neoplasm of prostate: Secondary | ICD-10-CM

## 2012-12-21 LAB — CBC WITH DIFFERENTIAL/PLATELET
BASO%: 0.6 % (ref 0.0–2.0)
Basophils Absolute: 0 10*3/uL (ref 0.0–0.1)
EOS%: 2.1 % (ref 0.0–7.0)
HGB: 13.5 g/dL (ref 13.0–17.1)
MCH: 31.3 pg (ref 27.2–33.4)
MCV: 92.8 fL (ref 79.3–98.0)
MONO%: 8.9 % (ref 0.0–14.0)
RBC: 4.3 10*6/uL (ref 4.20–5.82)
RDW: 14.7 % — ABNORMAL HIGH (ref 11.0–14.6)
lymph#: 3.9 10*3/uL — ABNORMAL HIGH (ref 0.9–3.3)

## 2012-12-21 LAB — COMPREHENSIVE METABOLIC PANEL (CC13)
ALT: 11 U/L (ref 0–55)
AST: 10 U/L (ref 5–34)
Albumin: 3 g/dL — ABNORMAL LOW (ref 3.5–5.0)
Alkaline Phosphatase: 91 U/L (ref 40–150)
BUN: 10.9 mg/dL (ref 7.0–26.0)
Calcium: 8.7 mg/dL (ref 8.4–10.4)
Chloride: 108 mEq/L — ABNORMAL HIGH (ref 98–107)
Potassium: 4 mEq/L (ref 3.5–5.1)
Sodium: 142 mEq/L (ref 136–145)
Total Protein: 6.6 g/dL (ref 6.4–8.3)

## 2012-12-21 NOTE — Progress Notes (Signed)
Hematology and Oncology Follow Up Visit  Alec Harris [id number] 22-Dec-1945 67 y.o. 12/21/2012 11:58 AM [name], MDBerry, Delton See, MD   Principle Diagnosis: 67 year old with Castration resistant prostate cancer.  Prior Therapy: He was initially diagnosed in 2003 with an elevated PSA. He underwent a prostatectomy with a pathology that showed prostatic adenocarcinoma. Gleason score 4+4 equals 8, with the tumor involving the margins and the pathologic staging was pT4 N1 with 1 of 2 left pelvic lymph nodes involved. He was started on adjuvant hormone therapy with Lupron. He developed a rising PSA and bone metastasis. He was then treated with ketoconazole and prednisone. He developed a rising PSA with a doubling time of just over 6 months. Bone scan in September 2013 showed increased uptake in the right lower lumbar spine at L4 and L5 and abnormal uptake in the mid sternum which had progressed since the prior bone scan.  Current therapy: Zytiga 1000 mg daily started in October 2013. He remains on Lupron at [facility] urology.  Interim History:  Mr. Mckey returns for a routine visit. He has been on Zytiga since 05/2012 and is tolerating this well. He denies chest pain, shortness of breath, abdominal pain, nausea, vomiting. No lower extremity edema noted. No back pain. No neurological changes. Denies hematuria or other evidence of bleeding. Appetite and weight are stable.  Appetite and weight stable. He is not reporting any new complications with Zytiga. He was able to play golf two days in a row and his right shoulder was hurting for a while and now has improved.   Medications: I have reviewed the patient's current medications. Current outpatient prescriptions:abiraterone Acetate (ZYTIGA) 250 MG tablet, Take 4 tablets (1,000 mg total) by mouth daily. Take on an empty stomach 1 hour before or 2 hours after a meal, Disp: 120 tablet, Rfl: 0;  aspirin 81 MG chewable tablet, Chew 81 mg by mouth daily.  ,  Disp: , Rfl: ;  Calcium Carb-Cholecalciferol (CALCIUM 500 +D) 500-400 MG-UNIT TABS, Take 1 tablet by mouth 2 (two) times daily., Disp: , Rfl:  cilostazol (PLETAL) 100 MG tablet, Take 100 mg by mouth 2 (two) times daily. , Disp: , Rfl:   Allergies: No Known Allergies  Past Medical History, Surgical history, Social history, and Family History were reviewed and updated.  Review of Systems: Constitutional:  Negative for fever, chills, night sweats, anorexia, weight loss, pain. Cardiovascular: no chest pain or dyspnea on exertion Respiratory: no cough, shortness of breath, or wheezing Neurological: no TIA or stroke symptoms Dermatological: negative ENT: negative Skin: Negative. Gastrointestinal: no abdominal pain, change in bowel habits, or black or bloody stools Genito-Urinary: no dysuria, trouble voiding, or hematuria Hematological and Lymphatic: negative Breast: negative for breast lumps Musculoskeletal: negative Remaining ROS negative.  Physical Exam: Blood pressure 188/69, pulse 100, temperature 95.8 F (35.4 C), temperature source Oral, resp. rate 18, height 5\' 9"  (1.753 m), weight 225 lb (102.059 kg), SpO2 90.00%. ECOG: 1 General appearance: alert, cooperative and no distress Head: Normocephalic, without obvious abnormality, atraumatic Neck: no adenopathy, no carotid bruit, no JVD, supple, symmetrical, trachea midline and thyroid not enlarged, symmetric, no tenderness/mass/nodules Lymph nodes: Cervical, supraclavicular, and axillary nodes normal. Heart:regular rate and rhythm, S1, S2 normal, no murmur, click, rub or gallop Lung:chest clear, no wheezing, rales, normal symmetric air entry, no tachypnea, retractions or cyanosis Abdomen: soft, non-tender, without masses or organomegaly EXT:no erythema, induration, or nodules   Lab Results: Lab Results  Component Value Date   WBC 7.6  12/21/2012   HGB 13.5 12/21/2012   HCT 39.9 12/21/2012   MCV 92.8 12/21/2012   PLT 161 12/21/2012      Chemistry      Component Value Date/Time   NA 144 11/08/2012 1313   NA 139 04/09/2012 0500   K 3.9 11/08/2012 1313   K 4.0 04/09/2012 0500   CL 107 11/08/2012 1313   CL 106 04/09/2012 0500   CO2 27 11/08/2012 1313   CO2 27 04/09/2012 0500   BUN 12.5 11/08/2012 1313   BUN 12 04/09/2012 0500   CREATININE 1.0 11/08/2012 1313   CREATININE 0.93 04/09/2012 0500      Component Value Date/Time   CALCIUM 9.0 11/08/2012 1313   CALCIUM 8.6 04/09/2012 0500   ALKPHOS 91 11/08/2012 1313   ALKPHOS 70 04/02/2012 0900   AST 10 11/08/2012 1313   AST 13 04/02/2012 0900   ALT 9 11/08/2012 1313   ALT 9 04/02/2012 0900   BILITOT 0.60 11/08/2012 1313   BILITOT 0.2* 04/02/2012 0900     Results for IRA, DOUGHER (MRN [id number]) as of 12/21/2012 12:00  Ref. Range 09/07/2012 11:15 10/05/2012 12:57 11/08/2012 13:13  PSA Latest Range: <=4.00 ng/mL 0.93 0.68 0.51     Impression and Plan:  This is a 67 year-old gentleman with the following issues:  1. Castration resistant prostate cancer. Currently on Zytiga and tolerating it well. I have recommended that he continue Zytiga without dose modification. PSA is pending today. Last PSA dropped to 0.51 from 6.11. 2. Androgen deprivation. He remains on Lupron under the care of Dr. Brunilda Payor. Due February 2014. 3. Bone directed therapy. We will address this with him in the future. He has very minimal bone involvement, but we may consider this down the line. Continue calcium and vitamin D supplements. 4. High blood pressure today: we will check it again. This is likely to anxiety bit will monitor and refer him to his PCP if it continues to go up.  5. Followup. In about 4-5 weeks.     [name] 5/6/201411:58 AM

## 2012-12-21 NOTE — Telephone Encounter (Signed)
gv and printed appt sched and avs for pt for June... °

## 2012-12-23 NOTE — Telephone Encounter (Signed)
RECEIVED A FAX FROM [facility] CONCERNING A CONFIRMATION OF PRESCRIPTION SHIPMENT FOR ZYTIGA ON 12/22/12.

## 2012-12-29 ENCOUNTER — Ambulatory Visit (INDEPENDENT_AMBULATORY_CARE_PROVIDER_SITE_OTHER): Payer: Medicare Other | Admitting: Cardiovascular Disease

## 2012-12-29 ENCOUNTER — Encounter: Payer: Self-pay | Admitting: Cardiovascular Disease

## 2012-12-29 VITALS — BP 136/84 | HR 83 | Ht 68.75 in | Wt 221.0 lb

## 2012-12-29 DIAGNOSIS — Z79899 Other long term (current) drug therapy: Secondary | ICD-10-CM | POA: Diagnosis not present

## 2012-12-29 DIAGNOSIS — E785 Hyperlipidemia, unspecified: Secondary | ICD-10-CM | POA: Diagnosis not present

## 2012-12-29 DIAGNOSIS — I739 Peripheral vascular disease, unspecified: Secondary | ICD-10-CM | POA: Diagnosis not present

## 2012-12-29 DIAGNOSIS — I779 Disorder of arteries and arterioles, unspecified: Secondary | ICD-10-CM

## 2012-12-29 NOTE — Assessment & Plan Note (Signed)
Asymptomatic currently on Pletal. We will order lower extremity Dopplers to document progression of disease.

## 2012-12-29 NOTE — Assessment & Plan Note (Signed)
Stable carotid disease. Will order carotid duplex to follow his left internal carotid artery.

## 2012-12-29 NOTE — Progress Notes (Signed)
12/29/2012 Alec Harris   07-Jun-1946  [id number]  Primary Physician [name], MD Primary Cardiologist: [name] M.D.  HPI:  The patient is a 67 year old mildly overweight married African American male father of 2, grandfather to 2 grandchildren who I last saw 3 months ago. He was referred to me by Dr. Rennis Golden for PV evaluation. He has severe diffuse vascular disease involving his iliacs, SFAs and carotids. His claudication improved with Pletal. His other problems include hyperlipidemia. He had a negative Myoview Jan 14, 2012. He does continue to smoke. He stopped his Crestor on his own and did have an elevated lipid profile in the past with a total cholesterol of 255, LDL of 169 and HDL of 56. I angiogrammed him on March 02, 2012 revealing total SFAs bilaterally with 3-vessel runoff below the knee, high-grade calcified iliac disease, and bilateral carotid artery stenosis, 95% right and 80% left with a type 2 arch. He underwent elective right carotid endarterectomy by Dr. Durene Cal on April 08, 2012.over the last 6 months he denies chest pain shortness of breath or claudication. The Pletal has been beneficial. Unfortunately he did not obtain his carotid artery Doppler studies as originally intended.    Current Outpatient Prescriptions  Medication Sig Dispense Refill  . abiraterone Acetate (ZYTIGA) 250 MG tablet Take 4 tablets (1,000 mg total) by mouth daily. Take on an empty stomach 1 hour before or 2 hours after a meal  120 tablet  0  . aspirin 81 MG chewable tablet Chew 81 mg by mouth daily.        . Calcium Carb-Cholecalciferol (CALCIUM 500 +D) 500-400 MG-UNIT TABS Take 1 tablet by mouth 2 (two) times daily.      . cilostazol (PLETAL) 100 MG tablet Take 100 mg by mouth 2 (two) times daily.        No current facility-administered medications for this visit.    No Known Allergies  History   Social History  . Marital Status: Married    Spouse Name: N/A    Number of  Children: N/A  . Years of Education: N/A   Occupational History  . Not on file.   Social History Main Topics  . Smoking status: Current Every Day Smoker -- 50 years    Types: Cigarettes  . Smokeless tobacco: Never Used     Comment: pt states that he is going to quit completely; 1/2 pack per day  . Alcohol Use: 6.6 - 7.2 oz/week    10 Cans of beer, 1-2 Shots of liquor per week  . Drug Use: No  . Sexually Active: No     Comment: smokes 5 cigarettes per day....   Other Topics Concern  . Not on file   Social History Narrative  . No narrative on file     Review of Systems: General: negative for chills, fever, night sweats or weight changes.  Cardiovascular: negative for chest pain, dyspnea on exertion, edema, orthopnea, palpitations, paroxysmal nocturnal dyspnea or shortness of breath Dermatological: negative for rash Respiratory: negative for cough or wheezing Urologic: negative for hematuria Abdominal: negative for nausea, vomiting, diarrhea, bright red blood per rectum, melena, or hematemesis Neurologic: negative for visual changes, syncope, or dizziness All other systems reviewed and are otherwise negative except as noted above.    Blood pressure 136/84, pulse 83, height 5' 8.75" (1.746 m), weight 221 lb (100.245 kg).  General appearance: alert and no distress Neck: no adenopathy, no JVD, supple, symmetrical, trachea midline, thyroid not enlarged, symmetric,  no tenderness/mass/nodules and bbilateral carotid bruits Lungs: clear to auscultation bilaterally Heart: regular rate and rhythm, S1, S2 normal, no murmur, click, rub or gallop Extremities: extremities normal, atraumatic, no cyanosis or edema and 2+ femorals with bilateral bruits  EKG normal sinus rhythm without ST or T wave changes.  ASSESSMENT AND PLAN:   Carotid disease, bilateral Stable carotid disease. Will order carotid duplex to follow his left internal carotid artery.  PVD (peripheral vascular disease)  with claudication Asymptomatic currently on Pletal. We will order lower extremity Dopplers to document progression of disease.  Hyperlipemia Check fasting lipid profile      Oren Section, [place] 12/29/2012 9:54 AM

## 2012-12-29 NOTE — Patient Instructions (Addendum)
  Your physician wants you to follow-up in: 6 months. You will receive a reminder letter in the mail one month in advance. If you don't receive a letter, please call our office to schedule the follow-up appointment.   Your physician recommends that you return for lab work in: 1-2 weeks or today if you have fasted   No changes to your medications.  Your physician has ordered the following tests: Lower extremity dopplers and carotid dopplers

## 2012-12-29 NOTE — Assessment & Plan Note (Signed)
-   Check fasting lipid profile.

## 2013-01-04 IMAGING — CR DG CHEST 2V
2 series · 2 of 2 positions shown · non-contrast
Comparison: 03/22/2004

CLINICAL DATA: Preoperative evaluation for carotid endarterectomy.
50 pack year history of smoking.  Coronary artery disease.

CHEST - 2 VIEW

[view not recorded (1 of 2)]
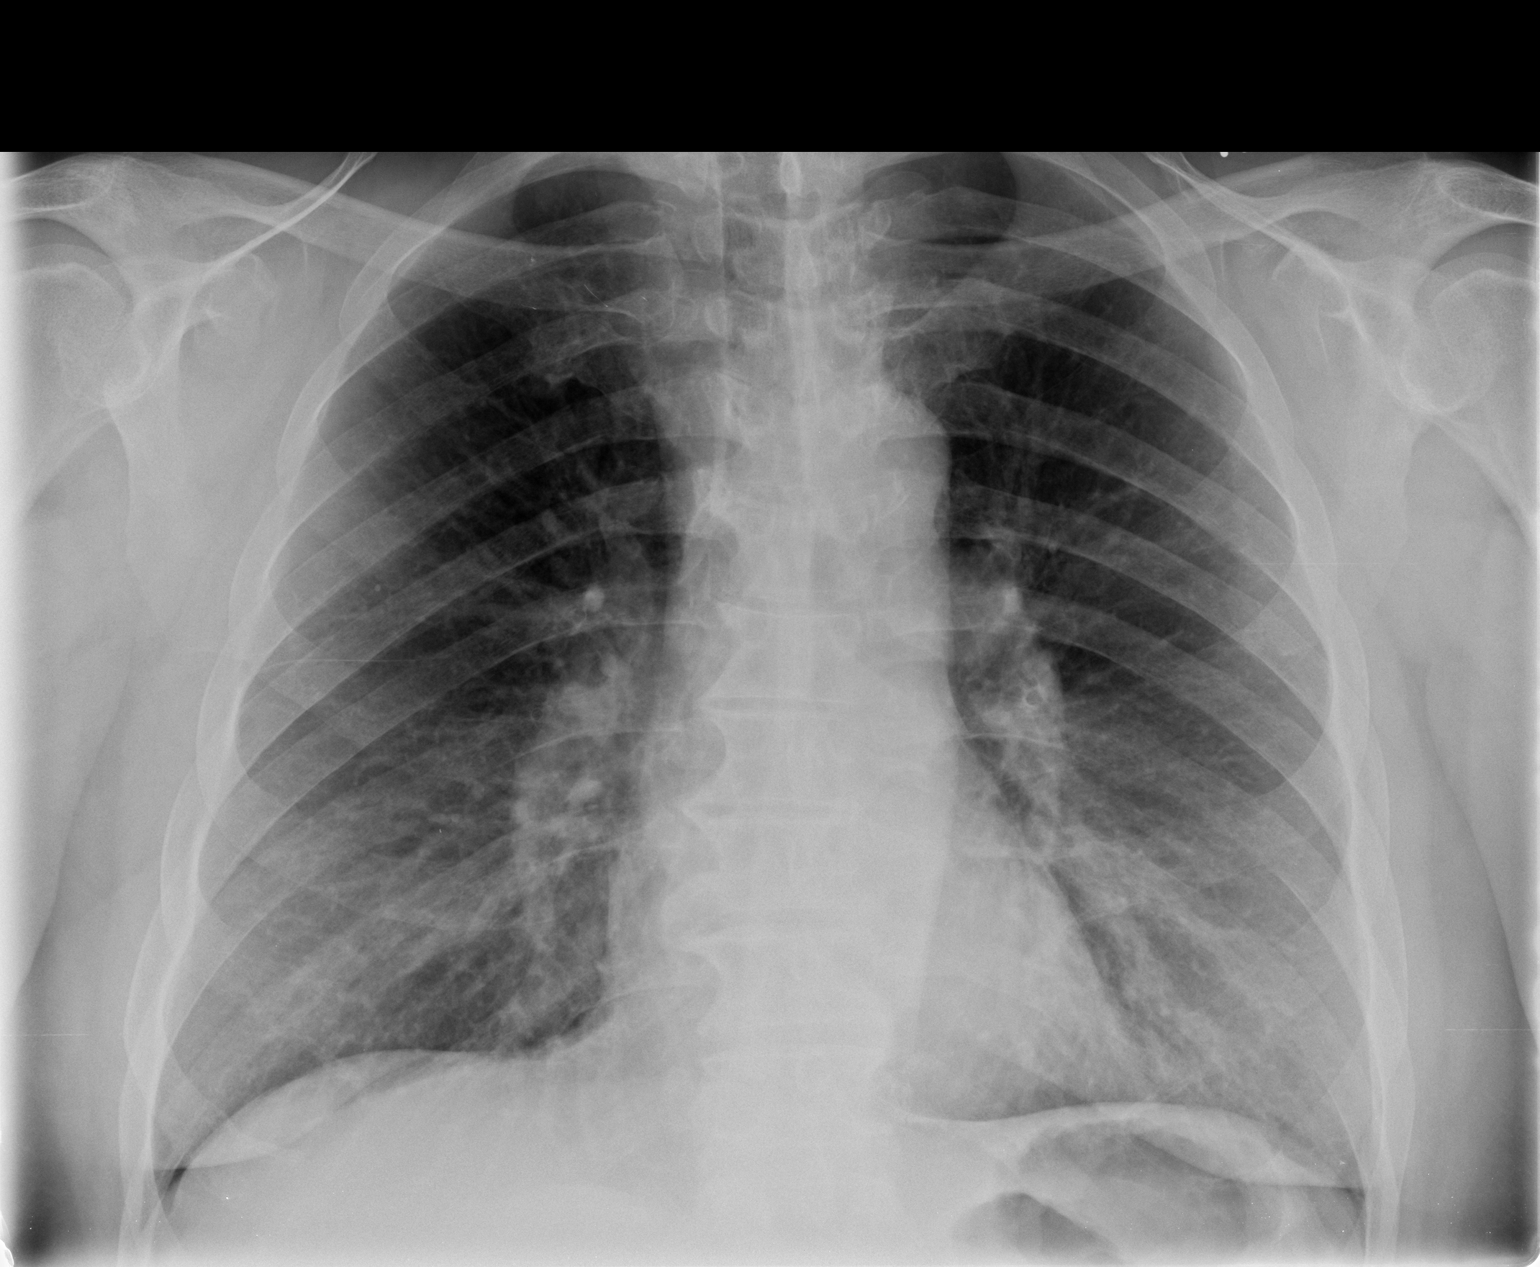

[view not recorded (2 of 2)]
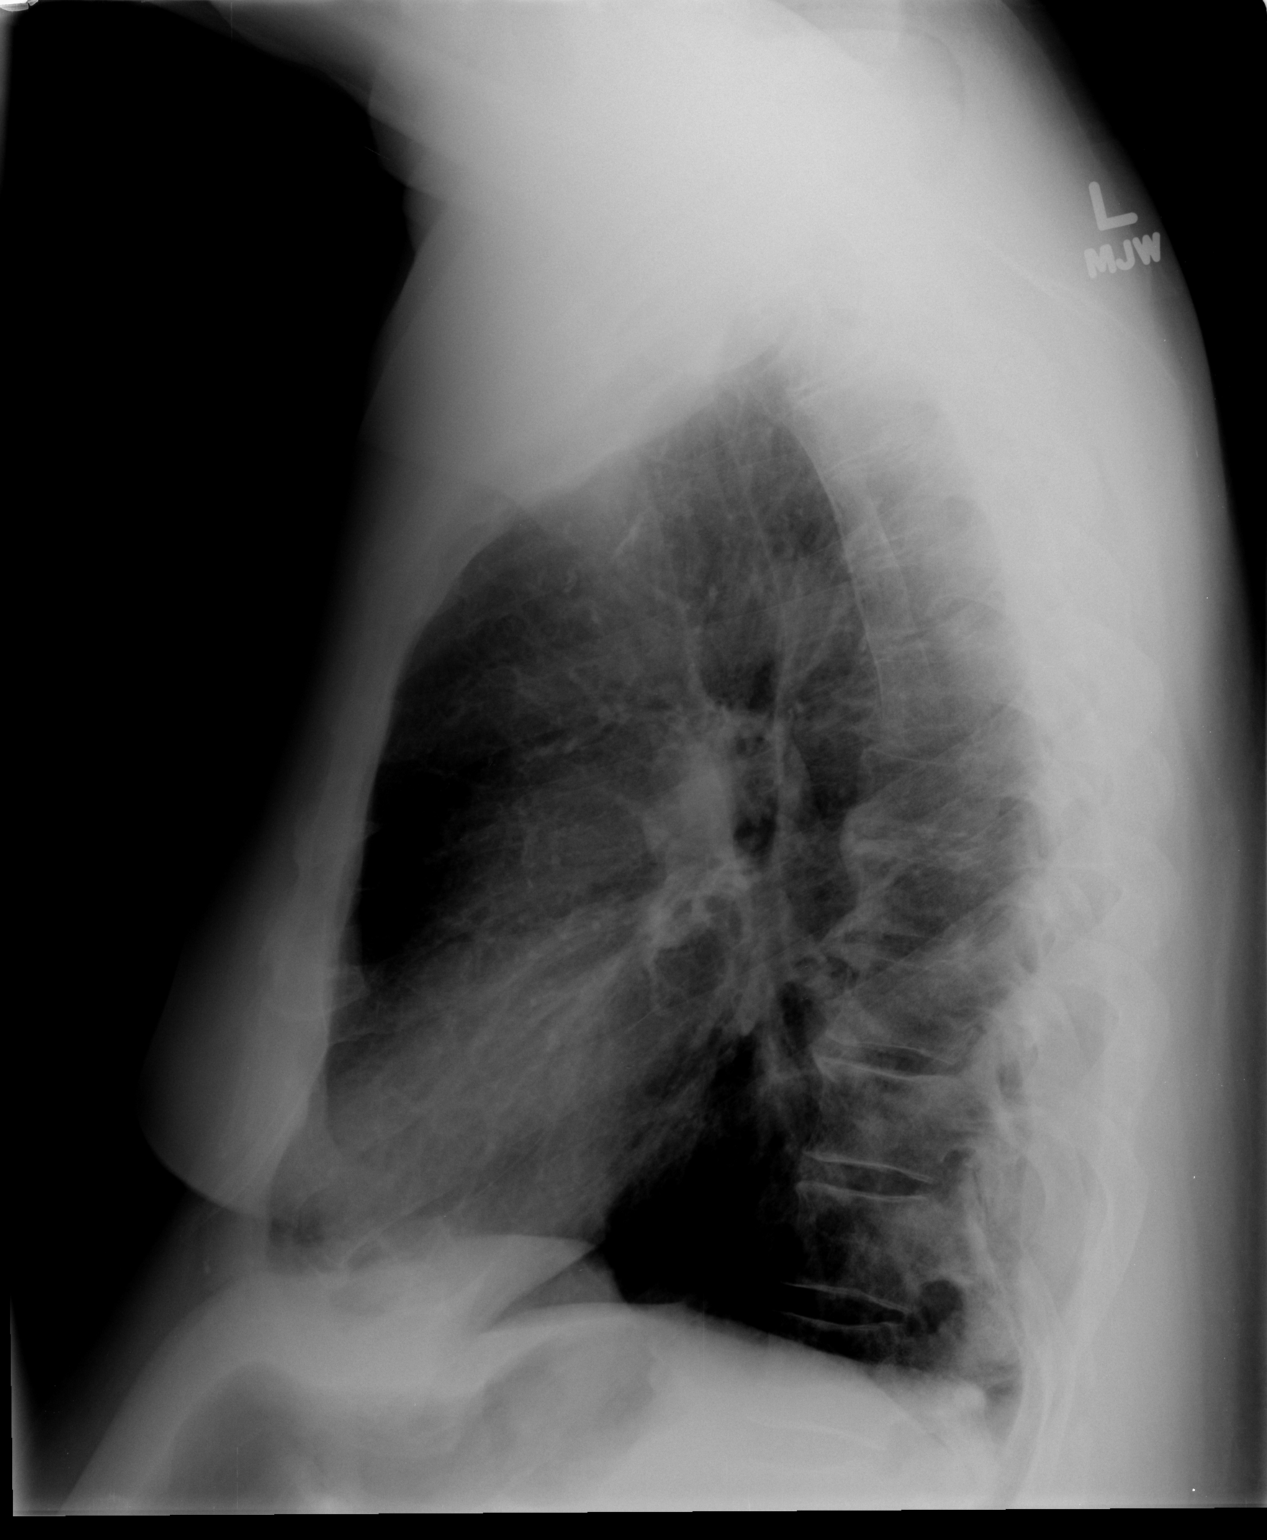

[2 of 2 positions shown; findings below may reference images not displayed]

FINDINGS: Heart and mediastinal contours are stable.  There is
persistent central peribronchial cuffing and prominence of the
interstitial markings diffusely throughout the lung fields.  This
would correlate with underlying bronchitic change and the patient's
longstanding history of smoking.  This has progressed slightly
since the previous exam.  No focal infiltrates or signs of
congestive failure are seen.  No pleural fluid is noted.

Bony structures demonstrate degenerative osteophytosis of the mid
thoracic spine and are otherwise stable.
IMPRESSION: Interval increase in underlying bronchitic change.  No new acute or
worrisome focal abnormality seen

## 2013-01-14 DIAGNOSIS — I779 Disorder of arteries and arterioles, unspecified: Secondary | ICD-10-CM | POA: Diagnosis not present

## 2013-01-14 DIAGNOSIS — Z79899 Other long term (current) drug therapy: Secondary | ICD-10-CM | POA: Diagnosis not present

## 2013-01-14 LAB — LIPID PANEL
Cholesterol: 136 mg/dL (ref 0–200)
HDL: 42 mg/dL (ref >39)
Triglycerides: 89 mg/dL (ref <150)
VLDL: 18 mg/dL (ref 0–40)

## 2013-01-14 LAB — HEPATIC FUNCTION PANEL
ALT: 17 U/L (ref 0–53)
Albumin: 3.7 g/dL (ref 3.5–5.2)
Alkaline Phosphatase: 96 U/L (ref 39–117)
Indirect Bilirubin: 0.6 mg/dL (ref 0.0–0.9)
Total Protein: 6.6 g/dL (ref 6.0–8.3)

## 2013-01-17 ENCOUNTER — Telehealth: Payer: Self-pay | Admitting: *Deleted

## 2013-01-17 NOTE — Telephone Encounter (Signed)
[facility] faxed Zytiga refill request.  Request to provider for review.

## 2013-01-18 ENCOUNTER — Ambulatory Visit (HOSPITAL_BASED_OUTPATIENT_CLINIC_OR_DEPARTMENT_OTHER)
Admission: RE | Admit: 2013-01-18 | Discharge: 2013-01-18 | Disposition: A | Discharge status: Home / Self Care | Payer: Medicare Other | Source: Ambulatory Visit | Attending: Cardiovascular Disease | Admitting: Cardiovascular Disease

## 2013-01-18 ENCOUNTER — Other Ambulatory Visit: Payer: Self-pay | Admitting: *Deleted

## 2013-01-18 ENCOUNTER — Ambulatory Visit (HOSPITAL_COMMUNITY)
Admission: RE | Admit: 2013-01-18 | Discharge: 2013-01-18 | Disposition: A | Discharge status: Home / Self Care | Payer: Medicare Other | Source: Ambulatory Visit | Attending: Cardiovascular Disease | Admitting: Cardiovascular Disease

## 2013-01-18 DIAGNOSIS — I739 Peripheral vascular disease, unspecified: Secondary | ICD-10-CM | POA: Insufficient documentation

## 2013-01-18 DIAGNOSIS — R0989 Other specified symptoms and signs involving the circulatory and respiratory systems: Secondary | ICD-10-CM | POA: Diagnosis not present

## 2013-01-18 DIAGNOSIS — C61 Malignant neoplasm of prostate: Secondary | ICD-10-CM

## 2013-01-18 MED ORDER — ABIRATERONE ACETATE 250 MG PO TABS: 1000.0000 mg | ORAL_TABLET | Freq: Every day | ORAL | Status: DC

## 2013-01-18 NOTE — Progress Notes (Signed)
Arterial Duplex Completed. Alec Harris  

## 2013-01-18 NOTE — Telephone Encounter (Signed)
RECEIVED A FAX FROM [facility] CONCERNING A CONFIRMATION OF FACSIMILE RECEIPT FOR PT. REFERRAL. 

## 2013-01-18 NOTE — Progress Notes (Signed)
Carotid Duplex Completed. °Brianna L Mazza ° °

## 2013-01-21 NOTE — Telephone Encounter (Signed)
RECEIVED A FAX FROM [facility] CONCERNING A CONFIRMATION OF PRESCRIPTION SHIPMENT FOR ZYTIGA ON 01/20/13.

## 2013-02-01 ENCOUNTER — Other Ambulatory Visit (HOSPITAL_BASED_OUTPATIENT_CLINIC_OR_DEPARTMENT_OTHER): Payer: Medicare Other | Admitting: Lab

## 2013-02-01 ENCOUNTER — Ambulatory Visit (HOSPITAL_BASED_OUTPATIENT_CLINIC_OR_DEPARTMENT_OTHER): Payer: Medicare Other | Admitting: Oncology

## 2013-02-01 ENCOUNTER — Encounter: Payer: Self-pay | Admitting: Oncology

## 2013-02-01 ENCOUNTER — Telehealth: Payer: Self-pay | Admitting: Oncology

## 2013-02-01 VITALS — BP 181/67 | HR 83 | Temp 98.2°F | Resp 18 | Ht 68.75 in | Wt 218.9 lb

## 2013-02-01 DIAGNOSIS — C61 Malignant neoplasm of prostate: Secondary | ICD-10-CM

## 2013-02-01 DIAGNOSIS — L03119 Cellulitis of unspecified part of limb: Secondary | ICD-10-CM | POA: Diagnosis not present

## 2013-02-01 DIAGNOSIS — L02519 Cutaneous abscess of unspecified hand: Secondary | ICD-10-CM | POA: Diagnosis not present

## 2013-02-01 LAB — COMPREHENSIVE METABOLIC PANEL (CC13)
ALT: 32 U/L (ref 0–55)
AST: 20 U/L (ref 5–34)
Albumin: 3.2 g/dL — ABNORMAL LOW (ref 3.5–5.0)
Alkaline Phosphatase: 109 U/L (ref 40–150)
Glucose: 130 mg/dl — ABNORMAL HIGH (ref 70–99)
Potassium: 4 mEq/L (ref 3.5–5.1)
Sodium: 141 mEq/L (ref 136–145)
Total Protein: 7 g/dL (ref 6.4–8.3)

## 2013-02-01 LAB — CBC WITH DIFFERENTIAL/PLATELET
BASO%: 1 % (ref 0.0–2.0)
Eosinophils Absolute: 0.2 10*3/uL (ref 0.0–0.5)
MCHC: 34.3 g/dL (ref 32.0–36.0)
MCV: 91.6 fL (ref 79.3–98.0)
MONO%: 7.3 % (ref 0.0–14.0)
NEUT#: 5.2 10*3/uL (ref 1.5–6.5)
RBC: 4.49 10*6/uL (ref 4.20–5.82)
RDW: 14.6 % (ref 11.0–14.6)
WBC: 9.3 10*3/uL (ref 4.0–10.3)

## 2013-02-01 MED ORDER — SULFAMETHOXAZOLE-TRIMETHOPRIM 800-160 MG PO TABS: 1.0000 | ORAL_TABLET | Freq: Two times a day (BID) | ORAL | Status: DC

## 2013-02-01 MED ORDER — HYDROCODONE-ACETAMINOPHEN 5-325 MG PO TABS: 1.0000 | ORAL_TABLET | Freq: Four times a day (QID) | ORAL | Status: DC | PRN

## 2013-02-01 NOTE — Telephone Encounter (Signed)
Gave pt appt for for July 2014 lab and Md

## 2013-02-01 NOTE — Patient Instructions (Signed)
Results for Alec Harris, Alec Harris (MRN [id number]) as of 02/01/2013 11:48  Ref. Range 09/07/2012 11:15 10/05/2012 12:57 11/08/2012 13:13 12/21/2012 11:25  PSA Latest Range: <=4.00 ng/mL 0.93 0.68 0.51 0.49

## 2013-02-01 NOTE — Progress Notes (Addendum)
Hematology and Oncology Follow Up Visit  Alec Harris [id number] 02/06/1946 67 y.o. 02/01/2013 12:57 PM Harris,Alec L, MDBerry, Alec See, MD   Principle Diagnosis: 67 year old with Castration resistant prostate cancer.  Prior Therapy: He was initially diagnosed in 2003 with an elevated PSA. He underwent a prostatectomy with a pathology that showed prostatic adenocarcinoma. Gleason score 4+4 equals 8, with the tumor involving the margins and the pathologic staging was pT4 N1 with 1 of 2 left pelvic lymph nodes involved. He was started on adjuvant hormone therapy with Lupron. He developed a rising PSA and bone metastasis. He was then treated with ketoconazole and prednisone. He developed a rising PSA with a doubling time of just over 6 months. Bone scan in September 2013 showed increased uptake in the right lower lumbar spine at L4 and L5 and abnormal uptake in the mid sternum which had progressed since the prior bone scan.  Current therapy: Zytiga 1000 mg daily started in October 2013. He remains on Lupron at [facility] urology.  Interim History:  Alec Harris returns for a routine visit. He has been on Zytiga since 05/2012 and is tolerating this well. He denies chest pain, shortness of breath, abdominal pain, nausea, vomiting. No lower extremity edema noted. No back pain. No neurological changes. Denies hematuria or other evidence of bleeding. Appetite and weight are stable.  Appetite stable. He is not reporting any new complications with Zytiga. He has developed left wrist swelling and pain within the past 2-3 days. Area is warm to the touch. Denies fevers. No recent injury.  Medications: I have reviewed the patient's current medications. Current outpatient prescriptions:abiraterone Acetate (ZYTIGA) 250 MG tablet, Take 4 tablets (1,000 mg total) by mouth daily. Take on an empty stomach 1 hour before or 2 hours after a meal, Disp: 120 tablet, Rfl: 0;  aspirin 81 MG chewable tablet, Chew 81 mg by mouth  daily.  , Disp: , Rfl: ;  Calcium Carb-Cholecalciferol (CALCIUM 500 +D) 500-400 MG-UNIT TABS, Take 1 tablet by mouth 2 (two) times daily., Disp: , Rfl:  cilostazol (PLETAL) 100 MG tablet, Take 100 mg by mouth 2 (two) times daily. , Disp: , Rfl: ;  HYDROcodone-acetaminophen (NORCO) 5-325 MG per tablet, Take 1 tablet by mouth every 6 (six) hours as needed for pain., Disp: 30 tablet, Rfl: 0;  sulfamethoxazole-trimethoprim (BACTRIM DS,SEPTRA DS) 800-160 MG per tablet, Take 1 tablet by mouth 2 (two) times daily., Disp: 20 tablet, Rfl: 0  Allergies: No Known Allergies  Past Medical History, Surgical history, Social history, and Family History were reviewed and updated.  Review of Systems: Constitutional:  Negative for fever, chills, night sweats, anorexia, weight loss, pain. Cardiovascular: no chest pain or dyspnea on exertion Respiratory: no cough, shortness of breath, or wheezing Neurological: no TIA or stroke symptoms Dermatological: negative ENT: negative Skin: Negative. Gastrointestinal: no abdominal pain, change in bowel habits, or black or bloody stools Genito-Urinary: no dysuria, trouble voiding, or hematuria Hematological and Lymphatic: negative Breast: negative for breast lumps Musculoskeletal: negative Remaining ROS negative.  Physical Exam: Blood pressure 181/67, pulse 83, temperature 98.2 F (36.8 C), temperature source Oral, resp. rate 18, height 5' 8.75" (1.746 m), weight 218 lb 14.4 oz (99.292 kg), SpO2 100.00%. ECOG: 1 General appearance: alert, cooperative and no distress Head: Normocephalic, without obvious abnormality, atraumatic Neck: no adenopathy, no carotid bruit, no JVD, supple, symmetrical, trachea midline and thyroid not enlarged, symmetric, no tenderness/mass/nodules Lymph nodes: Cervical, supraclavicular, and axillary nodes normal. Heart:regular rate and rhythm, S1, S2  normal, no murmur, click, rub or gallop Lung:chest clear, no wheezing, rales, normal symmetric  air entry, no tachypnea, retractions or cyanosis Abdomen: soft, non-tender, without masses or organomegaly EXT:no erythema, induration, or nodules. Left wrist with edema. No streaking noted.   Lab Results: Lab Results  Component Value Date   WBC 9.3 02/01/2013   HGB 14.1 02/01/2013   HCT 41.2 02/01/2013   MCV 91.6 02/01/2013   PLT 177 02/01/2013     Chemistry      Component Value Date/Time   NA 141 02/01/2013 1120   NA 139 04/09/2012 0500   K 4.0 02/01/2013 1120   K 4.0 04/09/2012 0500   CL 106 02/01/2013 1120   CL 106 04/09/2012 0500   CO2 25 02/01/2013 1120   CO2 27 04/09/2012 0500   BUN 10.1 02/01/2013 1120   BUN 12 04/09/2012 0500   CREATININE 0.9 02/01/2013 1120   CREATININE 0.93 04/09/2012 0500      Component Value Date/Time   CALCIUM 9.2 02/01/2013 1120   CALCIUM 8.6 04/09/2012 0500   ALKPHOS 109 02/01/2013 1120   ALKPHOS 96 01/14/2013 0952   AST 20 02/01/2013 1120   AST 15 01/14/2013 0952   ALT 32 02/01/2013 1120   ALT 17 01/14/2013 0952   BILITOT 0.83 02/01/2013 1120   BILITOT 0.7 01/14/2013 0952     Results for Alec, Harris (MRN [id number]) as of 02/01/2013 11:48  Ref. Range 09/07/2012 11:15 10/05/2012 12:57 11/08/2012 13:13 12/21/2012 11:25  PSA Latest Range: <=4.00 ng/mL 0.93 0.68 0.51 0.49    Impression and Plan:  This is a 67 year-old gentleman with the following issues:  1. Castration resistant prostate cancer. Currently on Zytiga and tolerating it well. I have recommended that he continue Zytiga without dose modification. PSA is pending today. Last PSA dropped to 0.49 from 6.11. 2. Androgen deprivation. He remains on Lupron under the care of Dr. Brunilda Harris.  3. Bone directed therapy. We will address this with him in the future. He has very minimal bone involvement, but we may consider this down the line. Continue calcium and vitamin D supplements. 4. High blood pressure: BP is normal today. 5. left wrist cellulitis. I have prescribed Bactrim DS twice a day for 10 days. I have also  given a prescription for Norco to use as needed for his left wrist pain. 6. Followup. In about 4-5 weeks.  Patient seen and examined with Dr Alec Harris Croft.   Clenton Pare 6/17/201412:57 PM

## 2013-02-01 NOTE — Progress Notes (Signed)
Patient seen and examined fully today. He is doing well with Zytiga without any major complications. He still reporting right arm and wrist swelling and pain. He had been treated for cellulitis in the past and feels rather similar.  On my examination today some alert and awake gentleman appeared in no active distress he was not septic appearing normal vital. His left upper extremity examination-year-old slight swelling in his hands in his wrist mild erythema noted and some point tenderness around the wrist area. The rest of his examination was completely unremarkable.  I discussed with him the plan at this point. He had an excellent PSA response which I discussed with him today in detail and the fact that he is tolerating Zytiga without any major toxicity we will continue Zytiga the same dose and schedule him continue to monitor his PSA closely.   As for his arm swelling, we will treated as cellulitis but given him clear instructions to let me know if it does not improve in the next week so we can do the appropriate referral for possible imaging studies. Other possibility this could be fasciitis or myositis and less likely thrombosis for deep vein.

## 2013-02-07 ENCOUNTER — Telehealth: Payer: Self-pay | Admitting: Cardiovascular Disease

## 2013-02-08 NOTE — Telephone Encounter (Signed)
Spoke with patient and gave doppler results 

## 2013-02-08 NOTE — Telephone Encounter (Signed)
Returning call from Friday-concerning his test results!

## 2013-02-08 NOTE — Telephone Encounter (Signed)
Message forwarded to K. Vogel, RN.  

## 2013-02-16 ENCOUNTER — Other Ambulatory Visit: Payer: Self-pay | Admitting: *Deleted

## 2013-02-16 DIAGNOSIS — C61 Malignant neoplasm of prostate: Secondary | ICD-10-CM

## 2013-02-16 MED ORDER — ABIRATERONE ACETATE 250 MG PO TABS: 1000.0000 mg | ORAL_TABLET | Freq: Every day | ORAL | Status: DC

## 2013-02-16 NOTE — Telephone Encounter (Signed)
Refill request to MD desk for approval. 

## 2013-02-16 NOTE — Addendum Note (Signed)
Addended by: Wandalee Ferdinand on: 02/16/2013 01:51 PM   Modules accepted: Orders

## 2013-02-21 DIAGNOSIS — C61 Malignant neoplasm of prostate: Secondary | ICD-10-CM | POA: Diagnosis not present

## 2013-02-21 NOTE — Telephone Encounter (Signed)
RECEIVED A FAX FROM [facility] CONCERNING A CONFIRMATION OF FACSIMILE RECEIPT FOR PT. REFERRAL. 

## 2013-02-22 NOTE — Telephone Encounter (Signed)
RECEIVED A FAX FROM [facility] CONCERNING A CONFIRMATION OF PRESCRIPTION SHIPMENT FOR ZYTIGA ON 02/21/13. 

## 2013-02-24 ENCOUNTER — Ambulatory Visit (INDEPENDENT_AMBULATORY_CARE_PROVIDER_SITE_OTHER): Payer: Medicare Other | Admitting: Cardiovascular Disease

## 2013-02-24 ENCOUNTER — Encounter: Payer: Self-pay | Admitting: Cardiovascular Disease

## 2013-02-24 VITALS — BP 134/76 | HR 66 | Ht 68.0 in | Wt 214.4 lb

## 2013-02-24 DIAGNOSIS — I779 Disorder of arteries and arterioles, unspecified: Secondary | ICD-10-CM | POA: Diagnosis not present

## 2013-02-24 DIAGNOSIS — I739 Peripheral vascular disease, unspecified: Secondary | ICD-10-CM

## 2013-02-24 NOTE — Patient Instructions (Signed)
You have been referred to Dr Durene Cal with [facility] for left carotid Endarterectomy  Your physician recommends that you schedule a follow-up appointment in: 3 months

## 2013-02-24 NOTE — Assessment & Plan Note (Signed)
Patient does have occluded SFAs bilaterally as well as high-grade calcified iliac disease. He is on Pletal with moderate clinical benefit. He says he does not walk enough at this point to be limited by claudication and therefore does not want to pursue percutaneous iliac rebut vascularization which would need to be performed using diamondback orbital rotational atherectomy.

## 2013-02-24 NOTE — Assessment & Plan Note (Signed)
He is approximately one year status post right carotid endarterectomy performed by Dr. Durene Cal 04/08/12. At the time of angiography he did have an 80% left internal carotid artery stenosis. Followup carotid Dopplers performed in our office 01/18/13 suggest progression of disease. I am going to refer him back to Dr. Myra Gianotti for consideration of elective left carotid endarterectomy. The patient does wish to entertain the possibility of carotid artery stenting.

## 2013-02-24 NOTE — Progress Notes (Signed)
02/24/2013 Alec Harris   February 03, 1946  [id number]  Primary Physician Lavell Islam, MD Primary Cardiologist: [name] MD [name]   HPI:  The patient is a 67 year old mildly overweight married African American male father of 2, grandfather to 2 grandchildren who I last saw 3 months ago. He was referred to me by Dr. Rennis Golden for PV evaluation. He has severe diffuse vascular disease involving his iliacs, SFAs and carotids. His claudication improved with Pletal. His other problems include hyperlipidemia. He had a negative Myoview Jan 14, 2012. He does continue to smoke. He stopped his Crestor on his own and did have an elevated lipid profile in the past with a total cholesterol of 255, LDL of 169 and HDL of 56. I angiogrammed him on March 02, 2012 revealing total SFAs bilaterally with 3-vessel runoff below the knee, high-grade calcified iliac disease, and bilateral carotid artery stenosis, 95% right and 80% left with a type 2 arch. He underwent elective right carotid endarterectomy by Dr. Durene Cal on April 08, 2012.over the last 6 months he denies chest pain shortness of breath or claudication. The Pletal has been beneficial. Unfortunately he did not obtain his carotid artery Doppler studies as originally intended. He had followup carotid Dopplers performed in our office on 01/18/13 which revealed a widely patent right carotid endarterectomy site, and progression of disease on the left which had already been documented angiographically to be in the 80% range. He was neurologically asymptomatic. I believe he will require elective left carotid endarterectomy for the patient which wishes to consider carotid artery stenting. His lotion to Dopplers have not changed. His has ABIs in the 0.5 range bilaterally with occluded SFAs and high-grade calcified iliac disease.    Current Outpatient Prescriptions  Medication Sig Dispense Refill  . abiraterone Acetate (ZYTIGA) 250 MG tablet Take 4  tablets (1,000 mg total) by mouth daily. Take on an empty stomach 1 hour before or 2 hours after a meal  120 tablet  0  . aspirin 81 MG chewable tablet Chew 81 mg by mouth daily.        . Calcium Carb-Cholecalciferol (CALCIUM 500 +D) 500-400 MG-UNIT TABS Take 1 tablet by mouth 2 (two) times daily.      . cilostazol (PLETAL) 100 MG tablet Take 100 mg by mouth 2 (two) times daily.       [name] sulfamethoxazole-trimethoprim (BACTRIM DS,SEPTRA DS) 800-160 MG per tablet Take 1 tablet by mouth 2 (two) times daily.  20 tablet  0   No current facility-administered medications for this visit.    No Known Allergies  History   Social History  . Marital Status: Married    Spouse Name: N/A    Number of Children: N/A  . Years of Education: N/A   Occupational History  . Not on file.   Social History Main Topics  . Smoking status: Current Every Day Smoker -- 50 years    Types: Cigarettes  . Smokeless tobacco: Never Used     Comment: pt states that he is going to quit completely; 1/2 pack per day  . Alcohol Use: 3.0 oz/week    3 Cans of beer, 2 Shots of liquor per week  . Drug Use: No  . Sexually Active: No     Comment: smokes 5 cigarettes per day....   Other Topics Concern  . Not on file   Social History Narrative  . No narrative on file     Review of Systems: General: negative for chills,  fever, night sweats or weight changes.  Cardiovascular: negative for chest pain, dyspnea on exertion, edema, orthopnea, palpitations, paroxysmal nocturnal dyspnea or shortness of breath Dermatological: negative for rash Respiratory: negative for cough or wheezing Urologic: negative for hematuria Abdominal: negative for nausea, vomiting, diarrhea, bright red blood per rectum, melena, or hematemesis Neurologic: negative for visual changes, syncope, or dizziness All other systems reviewed and are otherwise negative except as noted above.    Blood pressure 134/76, pulse 66, height 5\' 8"  (1.727 m), weight  214 lb 6.4 oz (97.251 kg).  General appearance: alert and no distress Neck: no adenopathy, no JVD, supple, symmetrical, trachea midline, thyroid not enlarged, symmetric, no tenderness/mass/nodules and left carotid bruit Lungs: clear to auscultation bilaterally Heart: regular rate and rhythm, S1, S2 normal, no murmur, click, rub or gallop Extremities: extremities normal, atraumatic, no cyanosis or edema  EKG not performed today  ASSESSMENT AND PLAN:   Carotid disease, bilateral He is approximately one year status post right carotid endarterectomy performed by Dr. Durene Cal 04/08/12. At the time of angiography he did have an 80% left internal carotid artery stenosis. Followup carotid Dopplers performed in our office 01/18/13 suggest progression of disease. I am going to refer him back to Dr. Myra Gianotti for consideration of elective left carotid endarterectomy. The patient does wish to entertain the possibility of carotid artery stenting.  PVD (peripheral vascular disease) with claudication Patient does have occluded SFAs bilaterally as well as high-grade calcified iliac disease. He is on Pletal with moderate clinical benefit. He says he does not walk enough at this point to be limited by claudication and therefore does not want to pursue percutaneous iliac rebut vascularization which would need to be performed using diamondback orbital rotational atherectomy.      [name] MD FACP,FACC,FAHA, [facility] 02/24/2013 10:30 AM

## 2013-02-25 ENCOUNTER — Other Ambulatory Visit: Payer: Self-pay | Admitting: *Deleted

## 2013-03-08 ENCOUNTER — Telehealth: Payer: Self-pay | Admitting: Oncology

## 2013-03-08 ENCOUNTER — Other Ambulatory Visit (HOSPITAL_BASED_OUTPATIENT_CLINIC_OR_DEPARTMENT_OTHER): Payer: Medicare Other | Admitting: Lab

## 2013-03-08 ENCOUNTER — Ambulatory Visit (HOSPITAL_BASED_OUTPATIENT_CLINIC_OR_DEPARTMENT_OTHER): Payer: Medicare Other | Admitting: Oncology

## 2013-03-08 VITALS — BP 161/81 | HR 75 | Temp 97.1°F | Resp 18 | Ht 68.0 in | Wt 213.2 lb

## 2013-03-08 DIAGNOSIS — C61 Malignant neoplasm of prostate: Secondary | ICD-10-CM

## 2013-03-08 LAB — CBC WITH DIFFERENTIAL/PLATELET
BASO%: 0.9 % (ref 0.0–2.0)
EOS%: 2 % (ref 0.0–7.0)
HCT: 39.8 % (ref 38.4–49.9)
LYMPH%: 47.6 % (ref 14.0–49.0)
MCH: 31.7 pg (ref 27.2–33.4)
MCHC: 34.5 g/dL (ref 32.0–36.0)
MCV: 91.8 fL (ref 79.3–98.0)
MONO%: 9 % (ref 0.0–14.0)
NEUT%: 40.5 % (ref 39.0–75.0)
Platelets: 185 10*3/uL (ref 140–400)
RBC: 4.34 10*6/uL (ref 4.20–5.82)
WBC: 7.8 10*3/uL (ref 4.0–10.3)

## 2013-03-08 LAB — COMPREHENSIVE METABOLIC PANEL (CC13)
ALT: 83 U/L — ABNORMAL HIGH (ref 0–55)
Alkaline Phosphatase: 97 U/L (ref 40–150)
Creatinine: 0.9 mg/dL (ref 0.7–1.3)
Sodium: 141 mEq/L (ref 136–145)
Total Bilirubin: 0.64 mg/dL (ref 0.20–1.20)
Total Protein: 6.7 g/dL (ref 6.4–8.3)

## 2013-03-08 LAB — PSA: PSA: 0.56 ng/mL (ref <=4.00)

## 2013-03-08 NOTE — Telephone Encounter (Signed)
gv and printed appt sched and avs for pt  °

## 2013-03-08 NOTE — Progress Notes (Signed)
Hematology and Oncology Follow Up Visit  Alec Harris [id number] 1946/06/30 67 y.o. 03/08/2013 9:09 AM AlecDAVIS Harris, MDCloward, [name], MD   Principle Diagnosis: 67 year old with Castration resistant prostate cancer. He was diagnosed in 2003 with Gleason score 4 + 4 = 8.   Prior Therapy: He was initially diagnosed in 2003 with an elevated PSA. He underwent a prostatectomy with a pathology that showed prostatic adenocarcinoma. Gleason score 4+4 equals 8, with the tumor involving the margins and the pathologic staging was pT4 N1 with 1 of 2 left pelvic lymph nodes involved. He was started on adjuvant hormone therapy with Lupron. He developed a rising PSA and bone metastasis. He was then treated with ketoconazole and prednisone. He developed a rising PSA with a doubling time of just over 6 months. Bone scan in September 2013 showed increased uptake in the right lower lumbar spine at L4 and L5 and abnormal uptake in the mid sternum which had progressed since the prior bone scan.  Current therapy: Zytiga 1000 mg daily started in October 2013. He remains on Lupron at [facility] urology.  Interim History:  Alec Harris returns for a routine visit. He has been on Zytiga since 05/2012 and is tolerating this well. He denies chest pain, shortness of breath, abdominal pain, nausea, vomiting. No lower extremity edema noted. No back pain. No neurological changes. Denies hematuria or other evidence of bleeding. Appetite and weight are stable.  Appetite and weight stable. He is not reporting any new complications with Zytiga. His arm swelling has resolved now.   Medications: I have reviewed the patient's current medications.  Current Outpatient Prescriptions  Medication Sig Dispense Refill  . abiraterone Acetate (ZYTIGA) 250 MG tablet Take 4 tablets (1,000 mg total) by mouth daily. Take on an empty stomach 1 hour before or 2 hours after a meal  120 tablet  0  . aspirin 81 MG chewable tablet Chew 81 mg by mouth  daily.        . Calcium Carb-Cholecalciferol (CALCIUM 500 +D) 500-400 MG-UNIT TABS Take 1 tablet by mouth 2 (two) times daily.      . cilostazol (PLETAL) 100 MG tablet Take 100 mg by mouth 2 (two) times daily.        No current facility-administered medications for this visit.     Allergies: No Known Allergies  Past Medical History, Surgical history, Social history, and Family History were reviewed and updated.  Review of Systems: Constitutional:  Negative for fever, chills, night sweats, anorexia, weight loss, pain. Cardiovascular: no chest pain or dyspnea on exertion Respiratory: no cough, shortness of breath, or wheezing Neurological: no TIA or stroke symptoms Dermatological: negative ENT: negative Skin: Negative. Gastrointestinal: no abdominal pain, change in bowel habits, or black or bloody stools Genito-Urinary: no dysuria, trouble voiding, or hematuria Hematological and Lymphatic: negative Breast: negative for breast lumps Musculoskeletal: negative Remaining ROS negative.  Physical Exam: Blood pressure 161/81, pulse 75, temperature 97.1 F (36.2 C), temperature source Oral, resp. rate 18, height 5\' 8"  (1.727 m), weight 213 lb 3.2 oz (96.707 kg). ECOG: 1 General appearance: alert, cooperative and no distress Head: Normocephalic, without obvious abnormality, atraumatic Neck: no adenopathy, no carotid bruit, no JVD, supple, symmetrical, trachea midline and thyroid not enlarged, symmetric, no tenderness/mass/nodules Lymph nodes: Cervical, supraclavicular, and axillary nodes normal. Heart:regular rate and rhythm, S1, S2 normal, no murmur, click, rub or gallop Lung:chest clear, no wheezing, rales, normal symmetric air entry, no tachypnea, retractions or cyanosis Abdomen: soft, non-tender, without masses  or organomegaly EXT:no erythema, induration, or nodules   Lab Results: Lab Results  Component Value Date   WBC 7.8 03/08/2013   HGB 13.7 03/08/2013   HCT 39.8 03/08/2013    MCV 91.8 03/08/2013   PLT 185 03/08/2013     Chemistry      Component Value Date/Time   NA 141 02/01/2013 1120   NA 139 04/09/2012 0500   K 4.0 02/01/2013 1120   K 4.0 04/09/2012 0500   CL 106 02/01/2013 1120   CL 106 04/09/2012 0500   CO2 25 02/01/2013 1120   CO2 27 04/09/2012 0500   BUN 10.1 02/01/2013 1120   BUN 12 04/09/2012 0500   CREATININE 0.9 02/01/2013 1120   CREATININE 0.93 04/09/2012 0500      Component Value Date/Time   CALCIUM 9.2 02/01/2013 1120   CALCIUM 8.6 04/09/2012 0500   ALKPHOS 109 02/01/2013 1120   ALKPHOS 96 01/14/2013 0952   AST 20 02/01/2013 1120   AST 15 01/14/2013 0952   ALT 32 02/01/2013 1120   ALT 17 01/14/2013 0952   BILITOT 0.83 02/01/2013 1120   BILITOT 0.7 01/14/2013 0952      Results for Alec Harris (MRN [id number]) as of 03/08/2013 09:01  Ref. Range 11/08/2012 13:13 12/21/2012 11:25 02/01/2013 11:20  PSA Latest Range: <=4.00 ng/mL 0.51 0.49 0.56    Impression and Plan:  This is a 67 year-old gentleman with the following issues:  1. Castration resistant prostate cancer. Currently on Zytiga and tolerating it well. I have recommended that he continue Zytiga without dose modification. PSA is pending today. Last PSA dropped to 0.56 from 6.11. 2. Androgen deprivation. He remains on Lupron under the care of Dr. Brunilda Payor. Due February 2014. 3. Bone directed therapy. We will address this with him in the future. He has very minimal bone involvement, but we may consider this down the line. Continue calcium and vitamin D supplements. 4. Followup. In about 4-5 weeks.     [facility] 7/22/20149:09 AM

## 2013-03-09 ENCOUNTER — Telehealth: Payer: Self-pay | Admitting: *Deleted

## 2013-03-09 NOTE — Telephone Encounter (Signed)
Message copied by Reesa Chew on Wed Mar 09, 2013 11:11 AM ------      Message from: Clenton Pare R      Created: Wed Mar 09, 2013 10:36 AM       Please call PSA. ------

## 2013-03-09 NOTE — Telephone Encounter (Signed)
Called patient with results of PSA drawn yesterday. 

## 2013-03-16 ENCOUNTER — Other Ambulatory Visit: Payer: Self-pay | Admitting: Internal Medicine

## 2013-03-16 NOTE — Telephone Encounter (Signed)
Rx was sent to pharmacy electronically. 

## 2013-03-18 ENCOUNTER — Encounter: Payer: Self-pay | Admitting: Surgery

## 2013-03-18 ENCOUNTER — Other Ambulatory Visit: Payer: Self-pay | Admitting: *Deleted

## 2013-03-18 ENCOUNTER — Other Ambulatory Visit: Payer: Self-pay

## 2013-03-18 DIAGNOSIS — C61 Malignant neoplasm of prostate: Secondary | ICD-10-CM

## 2013-03-18 MED ORDER — ABIRATERONE ACETATE 250 MG PO TABS: 1000.0000 mg | ORAL_TABLET | Freq: Every day | ORAL | Status: DC

## 2013-03-18 NOTE — Telephone Encounter (Signed)
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 

## 2013-03-21 ENCOUNTER — Encounter: Payer: Self-pay | Admitting: Surgery

## 2013-03-21 ENCOUNTER — Other Ambulatory Visit (INDEPENDENT_AMBULATORY_CARE_PROVIDER_SITE_OTHER): Payer: Medicare Other | Admitting: *Deleted

## 2013-03-21 ENCOUNTER — Ambulatory Visit (INDEPENDENT_AMBULATORY_CARE_PROVIDER_SITE_OTHER): Payer: Medicare Other | Admitting: Surgery

## 2013-03-21 DIAGNOSIS — Z01810 Encounter for preprocedural cardiovascular examination: Secondary | ICD-10-CM | POA: Diagnosis not present

## 2013-03-21 DIAGNOSIS — I6529 Occlusion and stenosis of unspecified carotid artery: Secondary | ICD-10-CM | POA: Diagnosis not present

## 2013-03-21 DIAGNOSIS — R490 Dysphonia: Secondary | ICD-10-CM | POA: Diagnosis not present

## 2013-03-21 NOTE — Progress Notes (Signed)
V[facility] and [facility] Specialist of [facility]   Patient name: Alec Harris MRN: [id number] DOB: [birth year] Sex: male     Chief Complaint  Patient presents with  . Carotid    Pt. ref. by Dr. Erlene Quan - Carotid stenosis with vascular lab study     HISTORY OF PRESENT ILLNESS: The patient comes back today for evaluation of his left carotid stenosis. He is well known to me, having undergone right carotid endarterectomy in August of 2013 for asymptomatic stenosis. Intraoperative findings included a high bifurcation with a lesion that extended 2 cm above the bifurcation. He has had progression of the stenosis on the left side. He is now in the greater than 80% category. He denies any symptoms. Specifically, he denies numbness or weakness in either extremity. He denies slurred speech. He denies amaurosis fugax.  The patient has a history of peripheral vascular disease. His symptoms didn't improve with Pletal. He has been on Crestor in the past. He continues to receive oral chemotherapy for prostate cancer.    Past Medical History  Diagnosis Date  . Prostate cancer   . Rectal bleeding 07/24/11    Diverticular Bleeding  . PAD (peripheral artery disease) 01/2012    severe diffuse his iliacs,SFA, carotids  . Carotid artery disease     By doppler  . Hyperlipemia   . Claudication 02/06/2012    ABI of 0.5 on both legs ,Rgt ext. iliac70 to 99%,rgt SFA occlude,lft ext.iliac common fem 70 to 99% ,Lft SFA occluded,mono flow popliteal    Past Surgical History  Procedure Laterality Date  . Knee cartilage surgery      surgery for a torn ligament  . Cervical spine surgery  approx. 10 years ago    disc removed from neck  . Colonoscopy  07/25/2011    Procedure: COLONOSCOPY;  Surgeon: Theda Belfast;  Location: [facility] ENDOSCOPY;  Service: Endoscopy;  Laterality: N/A;  . Prostate surgery  2003    for  prostate cancer  . Penile prosthesis implant    . Endarterectomy  04/08/2012    Procedure: ENDARTERECTOMY  CAROTID;  Surgeon: Nada Libman, MD;  [facility];  Service: Vascular;  Laterality: Right;  Right carotid endarterectomy with vascuguard 1cm x 6cm bovine patch angioplasty.   . Carotid angiogram  03/02/2012    bilateral carotid artery stenosis,95% right and 80% left with a type 2 arch  . Pv angiogram  03/02/2012    total SFAs bilaterally with 3- vessel runoff below the knee, high-grade calcified iliac diseaese  . Doppler echocardiography  03/24/2012    EF >70% LV normal  . Lexiscan myoview  01/14/2012    small fixed basal to mid inferior bowel attenuation artifact. no reversible ischemia  . Carotid endarterectomy Right 04-08-12    CEA    History   Social History  . Marital Status: Married    Spouse Name: N/A    Number of Children: N/A  . Years of Education: N/A   Occupational History  . Not on file.   Social History Main Topics  . Smoking status: Current Every Day Smoker -- 50 years    Types: Cigarettes  . Smokeless tobacco: Never Used     Comment: pt states that he is going to quit completely; 1/2 pack per day  . Alcohol Use: 3.0 oz/week    3 Cans of beer, 2 Shots of liquor per week  . Drug Use: No  . Sexually Active: No     Comment: smokes 5  cigarettes per day....   Other Topics Concern  . Not on file   Social History Narrative  . No narrative on file    Family History  Problem Relation Age of Onset  . Hypertension Mother   . Heart attack Mother   . Heart attack Father   . Heart disease Father     Irregular Heart beat, Heart Disease before age 36  . Cancer Brother     throat    Allergies as of 03/21/2013  . (No Known Allergies)    Current Outpatient Prescriptions on File Prior to Visit  Medication Sig Dispense Refill  . abiraterone Acetate (ZYTIGA) 250 MG tablet Take 4 tablets (1,000 mg total) by mouth daily. Take on an empty stomach 1 hour before or 2 hours after a meal  120 tablet  0  . aspirin 81 MG chewable tablet Chew 81 mg by mouth daily.         . Calcium Carb-Cholecalciferol (CALCIUM 500 +D) 500-400 MG-UNIT TABS Take 1 tablet by mouth 2 (two) times daily.      . cilostazol (PLETAL) 100 MG tablet TAKE 1 TABLET TWICE DAILY  60 tablet  11   No current facility-administered medications on file prior to visit.     REVIEW OF SYSTEMS: All systems are negative except what is mentioned in the history of present illness  PHYSICAL EXAMINATION:   Vital signs are BP 171/86  Pulse 74  Resp 16  Ht 5' 10.5" (1.791 m)  Wt 214 lb (97.07 kg)  BMI 30.26 kg/m2  SpO2 98% General: The patient appears their stated age. HEENT:  No gross abnormalities Pulmonary:  Non labored breathing Musculoskeletal: There are no major deformities. Neurologic: No focal weakness or paresthesias are detected, Skin: There are no ulcer or rashes noted. Psychiatric: The patient has normal affect. Cardiovascular: There is a regular rate and rhythm without significant murmur appreciated. No carotid bruits.   Diagnostic Studies Outside ultrasound suggested greater than 70% left carotid stenosis and minimal right stenosis. Ultrasound in our office was ordered and reviewed. This shows high-grade left carotid stenosis, greater than 80%. The velocity profile is elevated within the mid ICA. These measure 382/162. The lesion is approximately 1.65 cm in length  Assessment: Asymptomatic left carotid stenosis. Plan: I am going to refer the patient to hear nose and throat to evaluate his vocal cords prior to entertaining carotid endarterectomy on the left side. After reviewing my notes from his right carotid endarterectomy and reviewing the ultrasound results, the patient does have a rather high lesion. The patient is interested in carotid stenting. I feel that that might be reasonable given the location of his lesion. The patient will be scheduled to come back to see me in one month as we try to get this approved for stenting.  Jorge Ny, M.D. [facility]  [facility] of [place] Office: [phone] Pager:  [phone]

## 2013-03-22 ENCOUNTER — Other Ambulatory Visit: Payer: Self-pay | Admitting: *Deleted

## 2013-03-22 DIAGNOSIS — C61 Malignant neoplasm of prostate: Secondary | ICD-10-CM

## 2013-03-22 MED ORDER — ABIRATERONE ACETATE 250 MG PO TABS: 1000.0000 mg | ORAL_TABLET | Freq: Every day | ORAL | Status: DC

## 2013-03-22 NOTE — Progress Notes (Signed)
Thanks,  JJB

## 2013-04-13 ENCOUNTER — Ambulatory Visit (HOSPITAL_BASED_OUTPATIENT_CLINIC_OR_DEPARTMENT_OTHER): Payer: Medicare Other | Admitting: Oncology

## 2013-04-13 ENCOUNTER — Other Ambulatory Visit (HOSPITAL_BASED_OUTPATIENT_CLINIC_OR_DEPARTMENT_OTHER): Payer: Medicare Other | Admitting: Lab

## 2013-04-13 ENCOUNTER — Encounter: Payer: Self-pay | Admitting: Oncology

## 2013-04-13 ENCOUNTER — Telehealth: Payer: Self-pay | Admitting: Oncology

## 2013-04-13 VITALS — BP 180/86 | HR 68 | Temp 97.8°F | Resp 19 | Ht 70.0 in | Wt 215.6 lb

## 2013-04-13 DIAGNOSIS — C61 Malignant neoplasm of prostate: Secondary | ICD-10-CM

## 2013-04-13 LAB — CBC WITH DIFFERENTIAL/PLATELET
BASO%: 0.3 % (ref 0.0–2.0)
Basophils Absolute: 0 10*3/uL (ref 0.0–0.1)
HCT: 39 % (ref 38.4–49.9)
HGB: 13 g/dL (ref 13.0–17.1)
LYMPH%: 49.4 % — ABNORMAL HIGH (ref 14.0–49.0)
MCH: 30.4 pg (ref 27.2–33.4)
MCHC: 33.3 g/dL (ref 32.0–36.0)
MONO#: 0.5 10*3/uL (ref 0.1–0.9)
NEUT%: 40.9 % (ref 39.0–75.0)
Platelets: 142 10*3/uL (ref 140–400)
lymph#: 3.6 10*3/uL — ABNORMAL HIGH (ref 0.9–3.3)

## 2013-04-13 LAB — COMPREHENSIVE METABOLIC PANEL (CC13)
BUN: 11.7 mg/dL (ref 7.0–26.0)
CO2: 25 mEq/L (ref 22–29)
Calcium: 8.7 mg/dL (ref 8.4–10.4)
Chloride: 109 mEq/L (ref 98–109)
Creatinine: 0.9 mg/dL (ref 0.7–1.3)
Total Bilirubin: 0.58 mg/dL (ref 0.20–1.20)

## 2013-04-13 LAB — PSA: PSA: 0.48 ng/mL (ref <=4.00)

## 2013-04-13 NOTE — Progress Notes (Signed)
Hematology and Oncology Follow Up Visit  Alec Harris [id number] 31-Oct-1945 67 y.o. 04/13/2013 10:33 AM AlecDAVIS L, MDCloward, [name], MD   Principle Diagnosis: 67 year old with Castration resistant prostate cancer. He was diagnosed in 2003 with Gleason score 4 + 4 = 8.   Prior Therapy: He was initially diagnosed in 2003 with an elevated PSA. He underwent a prostatectomy with a pathology that showed prostatic adenocarcinoma. Gleason score 4+4 equals 8, with the tumor involving the margins and the pathologic staging was pT4 N1 with 1 of 2 left pelvic lymph nodes involved. He was started on adjuvant hormone therapy with Lupron. He developed a rising PSA and bone metastasis. He was then treated with ketoconazole and prednisone. He developed a rising PSA with a doubling time of just over 6 months. Bone scan in September 2013 showed increased uptake in the right lower lumbar spine at L4 and L5 and abnormal uptake in the mid sternum which had progressed since the prior bone scan.  Current therapy: Zytiga 1000 mg daily started in October 2013. He remains on Lupron at [facility] urology.  Interim History:  Alec Harris returns for a routine visit. He has been on Zytiga since 05/2012 and is tolerating this well. He denies chest pain, shortness of breath, abdominal pain, nausea, vomiting. No lower extremity edema noted. No back pain. No neurological changes. Denies hematuria or other evidence of bleeding. Appetite and weight are stable.  Appetite and weight stable. He is not reporting any new complications with Zytiga. His arm swelling has resolved now.   Medications: I have reviewed the patient's current medications.  Current Outpatient Prescriptions  Medication Sig Dispense Refill  . abiraterone Acetate (ZYTIGA) 250 MG tablet Take 4 tablets (1,000 mg total) by mouth daily. Take on an empty stomach 1 hour before or 2 hours after a meal  120 tablet  0  . aspirin 81 MG chewable tablet Chew 81 mg by mouth  daily.        . Calcium Carb-Cholecalciferol (CALCIUM 500 +D) 500-400 MG-UNIT TABS Take 1 tablet by mouth 2 (two) times daily.      . cilostazol (PLETAL) 100 MG tablet TAKE 1 TABLET TWICE DAILY  60 tablet  11   No current facility-administered medications for this visit.     Allergies: No Known Allergies  Past Medical History, Surgical history, Social history, and Family History were reviewed and updated.  Review of Systems: Constitutional:  Negative for fever, chills, night sweats, anorexia, weight loss, pain. Cardiovascular: no chest pain or dyspnea on exertion Respiratory: no cough, shortness of breath, or wheezing Neurological: no TIA or stroke symptoms Dermatological: negative ENT: negative Skin: Negative. Gastrointestinal: no abdominal pain, change in bowel habits, or black or bloody stools Genito-Urinary: no dysuria, trouble voiding, or hematuria Hematological and Lymphatic: negative Breast: negative for breast lumps Musculoskeletal: negative Remaining ROS negative.  Physical Exam: Blood pressure 180/86, pulse 68, temperature 97.8 F (36.6 C), temperature source Oral, resp. rate 19, height 5\' 10"  (1.778 m), weight 215 lb 9.6 oz (97.796 kg). ECOG: 1 General appearance: alert, cooperative and no distress Head: Normocephalic, without obvious abnormality, atraumatic Neck: no adenopathy, no carotid bruit, no JVD, supple, symmetrical, trachea midline and thyroid not enlarged, symmetric, no tenderness/mass/nodules Lymph nodes: Cervical, supraclavicular, and axillary nodes normal. Heart:regular rate and rhythm, S1, S2 normal, no murmur, click, rub or gallop Lung:chest clear, no wheezing, rales, normal symmetric air entry, no tachypnea, retractions or cyanosis Abdomen: soft, non-tender, without masses or organomegaly EXT:no erythema,  induration, or nodules   Lab Results: Lab Results  Component Value Date   WBC 7.4 04/13/2013   HGB 13.0 04/13/2013   HCT 39.0 04/13/2013   MCV  91.3 04/13/2013   PLT 142 04/13/2013     Chemistry      Component Value Date/Time   NA 141 04/13/2013 0921   NA 139 04/09/2012 0500   K 4.0 04/13/2013 0921   K 4.0 04/09/2012 0500   CL 106 02/01/2013 1120   CL 106 04/09/2012 0500   CO2 25 04/13/2013 0921   CO2 27 04/09/2012 0500   BUN 11.7 04/13/2013 0921   BUN 12 04/09/2012 0500   CREATININE 0.9 04/13/2013 0921   CREATININE 0.93 04/09/2012 0500      Component Value Date/Time   CALCIUM 8.7 04/13/2013 0921   CALCIUM 8.6 04/09/2012 0500   ALKPHOS 86 04/13/2013 0921   ALKPHOS 96 01/14/2013 0952   AST 48* 04/13/2013 0921   AST 15 01/14/2013 0952   ALT 83* 04/13/2013 0921   ALT 17 01/14/2013 0952   BILITOT 0.58 04/13/2013 0921   BILITOT 0.7 01/14/2013 0952     Results for Alec Harris (MRN [id number]) as of 04/13/2013 09:36  Ref. Range 10/05/2012 12:57 11/08/2012 13:13 12/21/2012 11:25 02/01/2013 11:20 03/08/2013 08:30  PSA Latest Range: <=4.00 ng/mL 0.68 0.51 0.49 0.56 0.56    Impression and Plan:  This is a 67 year-old gentleman with the following issues:  1. Castration resistant prostate cancer. Currently on Zytiga and tolerating it well. I have recommended that he continue Zytiga without dose modification. PSA is pending today. Last PSA dropped to 0.56 from 6.11. 2. Androgen deprivation. He remains on Lupron under the care of Dr. Brunilda Payor. Last given in June 2014. 3. Bone directed therapy. We will address this with him in the future. He has very minimal bone involvement, but we may consider this down the line. Continue calcium and vitamin D supplements. 4. Followup. In about 4-5 weeks.     [place], [place] 8/27/201410:33 AM

## 2013-04-13 NOTE — Telephone Encounter (Signed)
gv and printed appt sched and avs for pt for OCT. °

## 2013-04-14 ENCOUNTER — Telehealth: Payer: Self-pay | Admitting: *Deleted

## 2013-04-14 NOTE — Telephone Encounter (Signed)
Pt notified of results below. Pt pleased.  

## 2013-04-14 NOTE — Telephone Encounter (Signed)
Message copied by Phillis Knack on Thu Apr 14, 2013 10:21 AM ------      Message from: Clenton Pare R      Created: Thu Apr 14, 2013  8:39 AM      Regarding: PSA       Please call PSA to patient. Is is down a little more.            Thanks ------

## 2013-04-15 ENCOUNTER — Other Ambulatory Visit: Payer: Self-pay | Admitting: *Deleted

## 2013-04-15 NOTE — Telephone Encounter (Signed)
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 

## 2013-04-19 ENCOUNTER — Telehealth: Payer: Self-pay | Admitting: *Deleted

## 2013-04-19 DIAGNOSIS — C61 Malignant neoplasm of prostate: Secondary | ICD-10-CM

## 2013-04-19 MED ORDER — ABIRATERONE ACETATE 250 MG PO TABS: 1000.0000 mg | ORAL_TABLET | Freq: Every day | ORAL | Status: DC

## 2013-04-19 NOTE — Telephone Encounter (Signed)
Rx faxed to [facility] for Zytiga 250mg  Q# 120 pt to take 4 tablets (1,000mg ) by mouth daily. Take on an empty stomach 1 hr prior or 2 hrs after a meal.

## 2013-04-22 NOTE — Telephone Encounter (Signed)
RECEIVED A FAX FROM [facility] CONCERNING A CONFIRMATION OF PRESCRIPTION SHIPMENT FOR ZYTIGA ON 07-Aug-2044.

## 2013-04-29 ENCOUNTER — Encounter: Payer: Self-pay | Admitting: Surgery

## 2013-05-02 ENCOUNTER — Ambulatory Visit (INDEPENDENT_AMBULATORY_CARE_PROVIDER_SITE_OTHER): Payer: Self-pay | Admitting: Surgery

## 2013-05-02 ENCOUNTER — Encounter: Payer: Self-pay | Admitting: Surgery

## 2013-05-02 DIAGNOSIS — I6529 Occlusion and stenosis of unspecified carotid artery: Secondary | ICD-10-CM

## 2013-05-02 NOTE — Progress Notes (Signed)
The patient has yet to be evaluated by the nose and throat. He will have this done and then followup with me. We will discuss the options for either endarterectomy versus stenting at that time. This is a no charge visit

## 2013-05-03 DIAGNOSIS — J383 Other diseases of vocal cords: Secondary | ICD-10-CM | POA: Diagnosis not present

## 2013-05-06 ENCOUNTER — Encounter: Payer: Self-pay | Admitting: Surgery

## 2013-05-09 ENCOUNTER — Ambulatory Visit (INDEPENDENT_AMBULATORY_CARE_PROVIDER_SITE_OTHER): Payer: Medicare Other | Admitting: Surgery

## 2013-05-09 ENCOUNTER — Encounter: Payer: Self-pay | Admitting: Surgery

## 2013-05-09 DIAGNOSIS — I6529 Occlusion and stenosis of unspecified carotid artery: Secondary | ICD-10-CM | POA: Diagnosis not present

## 2013-05-09 NOTE — Progress Notes (Signed)
Vascular and Vein Specialist of [facility]   Patient name: Alec Harris MRN: [id number] DOB: [birth year] Sex: male     Chief Complaint  Patient presents with  . Re-evaluation    1 wk f /u post ENT visit    HISTORY OF PRESENT ILLNESS: The patient is back today for followup. He is status post right carotid endarterectomy with patch angioplasty on 04/08/2012. This was done for asymptomatic carotid stenosis. The stenosis on the left side has now progressed to greater than 80%. Because of the lesion location on the right which is similar to the lesion on the left (very high at the level of the mandible), I have been considering carotid stenting. The patient was evaluated by ENT to make sure his vocal cords were functioning properly, which they were. He remained asymptomatic.   Past Medical History  Diagnosis Date  . Prostate cancer   . Rectal bleeding 07/24/11    Diverticular Bleeding  . PAD (peripheral artery disease) 01/2012    severe diffuse his iliacs,SFA, carotids  . Carotid artery disease     By doppler  . Hyperlipemia   . Claudication 02/06/2012    ABI of 0.5 on both legs ,Rgt ext. iliac70 to 99%,rgt SFA occlude,lft ext.iliac common fem 70 to 99% ,Lft SFA occluded,mono flow popliteal    Past Surgical History  Procedure Laterality Date  . Knee cartilage surgery      surgery for a torn ligament  . Cervical spine surgery  approx. 10 years ago    disc removed from neck  . Colonoscopy  07/25/2011    Procedure: COLONOSCOPY;  Surgeon: Theda Belfast;  Location: [facility];  Service: Endoscopy;  Laterality: N/A;  . Prostate surgery  2003    for  prostate cancer  . Penile prosthesis implant    . Endarterectomy  04/08/2012    Procedure: ENDARTERECTOMY CAROTID;  Surgeon: Nada Libman, MD;  Location: [facility] OR;  Service: Vascular;  Laterality: Right;  Right carotid endarterectomy with vascuguard 1cm x 6cm bovine patch angioplasty.   . Carotid angiogram  03/02/2012    bilateral carotid  artery stenosis,95% right and 80% left with a type 2 arch  . Pv angiogram  03/02/2012    total SFAs bilaterally with 3- vessel runoff below the knee, high-grade calcified iliac diseaese  . Doppler echocardiography  03/24/2012    EF >70% LV normal  . Lexiscan myoview  01/14/2012    small fixed basal to mid inferior bowel attenuation artifact. no reversible ischemia  . Carotid endarterectomy Right 04-08-12    CEA    History   Social History  . Marital Status: Married    Spouse Name: N/A    Number of Children: N/A  . Years of Education: N/A   Occupational History  . Not on file.   Social History Main Topics  . Smoking status: Current Every Day Smoker -- 50 years    Types: Cigarettes  . Smokeless tobacco: Never Used     Comment: pt states that he is going to quit completely; 1/2 pack per day  . Alcohol Use: 3.0 oz/week    3 Cans of beer, 2 Shots of liquor per week  . Drug Use: No  . Sexual Activity: No     Comment: smokes 5 cigarettes per day....   Other Topics Concern  . Not on file   Social History Narrative  . No narrative on file    Family History  Problem Relation Age of Onset  .  Hypertension Mother   . Heart attack Mother   . Heart attack Father   . Heart disease Father     Irregular Heart beat, Heart Disease before age 10  . Cancer Brother     throat    Allergies as of 05/09/2013  . (No Known Allergies)    Current Outpatient Prescriptions on File Prior to Visit  Medication Sig Dispense Refill  . abiraterone Acetate (ZYTIGA) 250 MG tablet Take 4 tablets (1,000 mg total) by mouth daily. Take on an empty stomach 1 hour before or 2 hours after a meal  120 tablet  0  . aspirin 81 MG chewable tablet Chew 81 mg by mouth daily.        . Calcium Carb-Cholecalciferol (CALCIUM 500 +D) 500-400 MG-UNIT TABS Take 1 tablet by mouth 2 (two) times daily.      . cilostazol (PLETAL) 100 MG tablet TAKE 1 TABLET TWICE DAILY  60 tablet  11   No current facility-administered  medications on file prior to visit.     REVIEW OF SYSTEMS: No changes from previous visits  PHYSICAL EXAMINATION:   Vital signs are BP 172/69  Pulse 70  Ht 5\' 9"  (1.753 m)  Wt 215 lb (97.523 kg)  BMI 31.74 kg/m2  SpO2 99% General: The patient appears their stated age. HEENT:  No gross abnormalities Pulmonary:  Non labored breathing Musculoskeletal: There are no major deformities. Neurologic: No focal weakness or paresthesias are detected, Skin: There are no ulcer or rashes noted. Psychiatric: The patient has normal affect. Cardiovascular: There is a regular rate and rhythm without significant murmur appreciated. Bilateral carotid bruit   Diagnostic Studies Greater than 80% carotid stenosis with high bifurcation  Assessment: Asymptomatic left carotid stenosis Status post right carotid endarterectomy Plan: The patient needs to undergo a left-sided carotid intervention. Surgical treatment of the right side was very difficult given the high bifurcation and distal extent of the lesion. He has a similar appearance on angiography on the left. Therefore, I would prefer to treat this with a carotid stent. However, this will depend on whether or not he can have insurance approval for the procedure. We were quite insurance approval. If he is approved for carotid stenting, I will need to initiate Plavix 5 days prior to his operation. If he is not approved for carotid stenting, I would proceed with left carotid endarterectomy. He will need to be off of his Pletal for the endarterectomy 5 days prior to his operation. The risks and benefits of the procedures were discussed with the patient. We will be in contact with him regarding the approved intervention for his carotid disease.  Jorge Ny, M.D. [facility] of [place] Office: [phone] Pager:  [phone]

## 2013-05-19 ENCOUNTER — Ambulatory Visit (HOSPITAL_BASED_OUTPATIENT_CLINIC_OR_DEPARTMENT_OTHER): Payer: Medicare Other | Admitting: Oncology

## 2013-05-19 ENCOUNTER — Telehealth: Payer: Self-pay | Admitting: Oncology

## 2013-05-19 ENCOUNTER — Other Ambulatory Visit (HOSPITAL_BASED_OUTPATIENT_CLINIC_OR_DEPARTMENT_OTHER): Payer: Medicare Other | Admitting: Lab

## 2013-05-19 VITALS — BP 181/72 | HR 74 | Temp 98.1°F | Resp 18 | Ht 69.0 in | Wt 213.9 lb

## 2013-05-19 DIAGNOSIS — C775 Secondary and unspecified malignant neoplasm of intrapelvic lymph nodes: Secondary | ICD-10-CM

## 2013-05-19 DIAGNOSIS — C61 Malignant neoplasm of prostate: Secondary | ICD-10-CM | POA: Diagnosis not present

## 2013-05-19 DIAGNOSIS — C7951 Secondary malignant neoplasm of bone: Secondary | ICD-10-CM

## 2013-05-19 LAB — COMPREHENSIVE METABOLIC PANEL (CC13)
AST: 28 U/L (ref 5–34)
Albumin: 3 g/dL — ABNORMAL LOW (ref 3.5–5.0)
BUN: 12.3 mg/dL (ref 7.0–26.0)
CO2: 25 mEq/L (ref 22–29)
Calcium: 9 mg/dL (ref 8.4–10.4)
Chloride: 107 mEq/L (ref 98–109)
Creatinine: 0.8 mg/dL (ref 0.7–1.3)
Glucose: 88 mg/dl (ref 70–140)
Potassium: 3.8 mEq/L (ref 3.5–5.1)

## 2013-05-19 LAB — CBC WITH DIFFERENTIAL/PLATELET
Basophils Absolute: 0.1 10*3/uL (ref 0.0–0.1)
EOS%: 2.4 % (ref 0.0–7.0)
Eosinophils Absolute: 0.2 10*3/uL (ref 0.0–0.5)
HCT: 40.1 % (ref 38.4–49.9)
HGB: 13.6 g/dL (ref 13.0–17.1)
MONO#: 0.9 10*3/uL (ref 0.1–0.9)
NEUT#: 3.6 10*3/uL (ref 1.5–6.5)
NEUT%: 39.6 % (ref 39.0–75.0)
RDW: 15 % — ABNORMAL HIGH (ref 11.0–14.6)
WBC: 9 10*3/uL (ref 4.0–10.3)
lymph#: 4.2 10*3/uL — ABNORMAL HIGH (ref 0.9–3.3)

## 2013-05-19 LAB — PSA: PSA: 0.47 ng/mL (ref <=4.00)

## 2013-05-19 NOTE — Telephone Encounter (Signed)
gv and printed appt sched and avs for pt for NOV °

## 2013-05-19 NOTE — Progress Notes (Signed)
Hematology and Oncology Follow Up Visit  Alec Harris [id number] Jan 05, 1946 67 y.o. 05/19/2013 9:42 AM [name],DAVIS L, MDCloward, [name], MD   Principle Diagnosis: 67 year old with Castration resistant prostate cancer. He was diagnosed in 2003 with Gleason score 4 + 4 = 8.   Prior Therapy: He was initially diagnosed in 2003 with an elevated PSA. He underwent a prostatectomy with a pathology that showed prostatic adenocarcinoma. Gleason score 4+4 equals 8, with the tumor involving the margins and the pathologic staging was pT4 N1 with 1 of 2 left pelvic lymph nodes involved. He was started on adjuvant hormone therapy with Lupron. He developed a rising PSA and bone metastasis. He was then treated with ketoconazole and prednisone. He developed a rising PSA with a doubling time of just over 6 months. Bone scan in September 2013 showed increased uptake in the right lower lumbar spine at L4 and L5 and abnormal uptake in the mid sternum which had progressed since the prior bone scan.  Current therapy: Zytiga 1000 mg daily started in October 2013. He remains on Lupron at [facility].  Interim History:  Alec Harris returns for a routine visit. He has been on Zytiga since 05/2012 and is tolerating this well without any new complications. He denies chest pain, shortness of breath, abdominal pain, nausea, vomiting. No lower extremity edema noted. No back pain. No neurological changes. Denies hematuria or other evidence of bleeding. Appetite and weight are stable.  Appetite and weight stable. He is not reporting any new complications with Zytiga. He is scheduled to have carotid surgery in the near future. He is not reporting any bone pain at this.  Medications: I have reviewed the patient's current medications.  Current Outpatient Prescriptions  Medication Sig Dispense Refill  . abiraterone Acetate (ZYTIGA) 250 MG tablet Take 4 tablets (1,000 mg total) by mouth daily. Take on an empty stomach 1 hour before  or 2 hours after a meal  120 tablet  0  . aspirin 81 MG chewable tablet Chew 81 mg by mouth daily.        . Calcium Carb-Cholecalciferol (CALCIUM 500 +D) 500-400 MG-UNIT TABS Take 1 tablet by mouth 2 (two) times daily.      . cilostazol (PLETAL) 100 MG tablet TAKE 1 TABLET TWICE DAILY  60 tablet  11   No current facility-administered medications for this visit.     Allergies: No Known Allergies  Past Medical History, Surgical history, Social history, and Family History were reviewed and updated.  Review of Systems:  Remaining ROS negative.  Physical Exam: Blood pressure 181/72, pulse 74, temperature 98.1 F (36.7 C), temperature source Oral, resp. rate 18, height 5\' 9"  (1.753 m), weight 213 lb 14.4 oz (97.024 kg). ECOG: 1 General appearance: alert, cooperative and no distress Head: Normocephalic, without obvious abnormality, atraumatic Neck: no adenopathy, no carotid bruit, no JVD, supple, symmetrical, trachea midline and thyroid not enlarged, symmetric, no tenderness/mass/nodules Lymph nodes: Cervical, supraclavicular, and axillary nodes normal. Heart:regular rate and rhythm, S1, S2 normal, no murmur, click, rub or gallop Lung:chest clear, no wheezing, rales, normal symmetric air entry, no tachypnea, retractions or cyanosis Abdomen: soft, non-tender, without masses or organomegaly EXT:no erythema, induration, or nodules   Lab Results: Lab Results  Component Value Date   WBC 9.0 05/19/2013   HGB 13.6 05/19/2013   HCT 40.1 05/19/2013   MCV 92.5 05/19/2013   PLT 152 05/19/2013     Chemistry      Component Value Date/Time  NA 141 04/13/2013 0921   NA 139 04/09/2012 0500   K 4.0 04/13/2013 0921   K 4.0 04/09/2012 0500   CL 106 02/01/2013 1120   CL 106 04/09/2012 0500   CO2 25 04/13/2013 0921   CO2 27 04/09/2012 0500   BUN 11.7 04/13/2013 0921   BUN 12 04/09/2012 0500   CREATININE 0.9 04/13/2013 0921   CREATININE 0.93 04/09/2012 0500      Component Value Date/Time   CALCIUM 8.7  04/13/2013 0921   CALCIUM 8.6 04/09/2012 0500   ALKPHOS 86 04/13/2013 0921   ALKPHOS 96 01/14/2013 0952   AST 48* 04/13/2013 0921   AST 15 01/14/2013 0952   ALT 83* 04/13/2013 0921   ALT 17 01/14/2013 0952   BILITOT 0.58 04/13/2013 0921   BILITOT 0.7 01/14/2013 0952      Results for PETR, BONTEMPO (MRN [id number]) as of 05/19/2013 09:29  Ref. Range 02/01/2013 11:20 03/08/2013 08:30 04/13/2013 09:21  PSA Latest Range: <=4.00 ng/mL 0.56 0.56 0.48    Impression and Plan:  This is a 67 year-old gentleman with the following issues:  1. Castration resistant prostate cancer. Currently on Zytiga and tolerating it well. I have recommended that he continue Zytiga without dose modification. PSA is pending today. Last PSA dropped to 0.48 from 6.11(in 05/2012). 2. Androgen deprivation. He remains on Lupron under the care of Dr. Brunilda Payor. Last given in June 2014. 3. Bone directed therapy. We will address this with him in the future. He has very minimal bone involvement, but we may consider this down the line. Continue calcium and vitamin D supplements. 4. Followup. In about 4-5 weeks.     Alec Harris 10/2/20149:42 AM

## 2013-05-24 ENCOUNTER — Other Ambulatory Visit: Payer: Self-pay | Admitting: *Deleted

## 2013-05-24 ENCOUNTER — Telehealth: Payer: Self-pay | Admitting: *Deleted

## 2013-05-24 MED ORDER — ABIRATERONE ACETATE 250 MG PO TABS: 1000.0000 mg | ORAL_TABLET | Freq: Every day | ORAL | Status: DC

## 2013-05-24 NOTE — Telephone Encounter (Signed)
Pt called said he only has enough medicine for 1-2 days worth. Called [facility] requested pt medicaiton- Zytiga- be overnight delivery. Refaxed Refill request to [facility].

## 2013-05-24 NOTE — Telephone Encounter (Signed)
Pt called with request for refill on Zytiga. Reviewed with MD, Rx sent to [facility] [phone]

## 2013-05-24 NOTE — Telephone Encounter (Signed)
Refill for zytiga refaxed to [facility] [phone]

## 2013-05-26 NOTE — Telephone Encounter (Signed)
RECEIVED A FAX FROM [facility] CONCERNING A CONFIRMATION OF PRESCRIPTION SHIPMENT FOR ZYTIGA ON 05/25/13.

## 2013-06-20 ENCOUNTER — Other Ambulatory Visit: Payer: Self-pay | Admitting: *Deleted

## 2013-06-20 MED ORDER — ABIRATERONE ACETATE 250 MG PO TABS: 1000.0000 mg | ORAL_TABLET | Freq: Every day | ORAL | Status: DC

## 2013-06-20 NOTE — Telephone Encounter (Signed)
Rx for Zytiga Faxed to [facility] [phone].

## 2013-06-20 NOTE — Telephone Encounter (Signed)
RECEIVED A FAX FROM [facility] CONCERNING A CONFIRMATION OF FACSIMILE RECEIPT FOR PT. REFERRAL. 

## 2013-06-21 ENCOUNTER — Other Ambulatory Visit: Payer: Self-pay

## 2013-06-21 ENCOUNTER — Telehealth: Payer: Self-pay

## 2013-06-21 MED ORDER — CLOPIDOGREL BISULFATE 75 MG PO TABS: ORAL_TABLET | ORAL | Status: DC

## 2013-06-21 NOTE — Telephone Encounter (Signed)
Contacted pt. per telephone to discuss scheduling the procedure for Left Carotid Stent on 07/05/13.  Pt. was advised he will need to start Plavix 75 mg., qd , 5 days prior to procedure; instructed to start Plavix on 06/30/13.  Also advised pt. to continue his Pletal, per instructions of Dr. Myra Gianotti.  Will order Plavix per electronic transmission to pt's pharmacy.  Will mail pt. specific instructions for the night before, and morning of the procedure regarding NPO status and medications to take. Verb. Understanding.

## 2013-06-23 ENCOUNTER — Telehealth: Payer: Self-pay | Admitting: *Deleted

## 2013-06-23 ENCOUNTER — Encounter (HOSPITAL_COMMUNITY): Payer: Self-pay | Admitting: Pharmacist

## 2013-06-23 NOTE — Telephone Encounter (Signed)
Medicare and [employer] [place] will not preauthorize this surgery; CPT [id number] Carotid stenting. The patient is aware of this and wishes to proceed with the surgery knowing that he will have to appeal for payment after the surgery. The carotid stenting is the preferred surgery over endarterectomy because of the patient's anatomy and the location of his stenosis. The other option is CPT 35301, carotid endarterectomy. Patient is fully aware of the risks and benefits of each surgery. Stenting has been scheduled for 07/05/13 with Dr. Myra Gianotti.

## 2013-06-24 NOTE — Telephone Encounter (Signed)
RECEIVED A FAX FROM [facility] CONCERNING A CONFIRMATION OF PRESCRIPTION SHIPMENT FOR ZYTIGA ON 06/23/13. 

## 2013-06-27 DIAGNOSIS — C61 Malignant neoplasm of prostate: Secondary | ICD-10-CM | POA: Diagnosis not present

## 2013-06-28 ENCOUNTER — Telehealth: Payer: Self-pay | Admitting: Oncology

## 2013-06-28 ENCOUNTER — Other Ambulatory Visit (HOSPITAL_BASED_OUTPATIENT_CLINIC_OR_DEPARTMENT_OTHER): Payer: Medicare Other | Admitting: Lab

## 2013-06-28 ENCOUNTER — Ambulatory Visit (HOSPITAL_BASED_OUTPATIENT_CLINIC_OR_DEPARTMENT_OTHER): Payer: Medicare Other | Admitting: Family

## 2013-06-28 ENCOUNTER — Encounter: Payer: Self-pay | Admitting: Family

## 2013-06-28 VITALS — BP 183/78 | HR 90 | Temp 98.6°F | Resp 18 | Ht 69.0 in | Wt 212.4 lb

## 2013-06-28 DIAGNOSIS — C774 Secondary and unspecified malignant neoplasm of inguinal and lower limb lymph nodes: Secondary | ICD-10-CM | POA: Diagnosis not present

## 2013-06-28 DIAGNOSIS — C7951 Secondary malignant neoplasm of bone: Secondary | ICD-10-CM

## 2013-06-28 DIAGNOSIS — C61 Malignant neoplasm of prostate: Secondary | ICD-10-CM

## 2013-06-28 LAB — COMPREHENSIVE METABOLIC PANEL (CC13)
ALT: 44 U/L (ref 0–55)
AST: 28 U/L (ref 5–34)
Albumin: 3.3 g/dL — ABNORMAL LOW (ref 3.5–5.0)
Alkaline Phosphatase: 92 U/L (ref 40–150)
BUN: 11 mg/dL (ref 7.0–26.0)
Chloride: 108 mEq/L (ref 98–109)
Potassium: 3.9 mEq/L (ref 3.5–5.1)
Sodium: 140 mEq/L (ref 136–145)
Total Protein: 6.6 g/dL (ref 6.4–8.3)

## 2013-06-28 LAB — CBC WITH DIFFERENTIAL/PLATELET
BASO%: 0.9 % (ref 0.0–2.0)
Basophils Absolute: 0.1 10*3/uL (ref 0.0–0.1)
EOS%: 1.9 % (ref 0.0–7.0)
LYMPH%: 39.8 % (ref 14.0–49.0)
MCH: 30.8 pg (ref 27.2–33.4)
MCV: 92.4 fL (ref 79.3–98.0)
MONO%: 10.8 % (ref 0.0–14.0)
Platelets: 151 10*3/uL (ref 140–400)
RBC: 4.43 10*6/uL (ref 4.20–5.82)
RDW: 14.6 % (ref 11.0–14.6)
lymph#: 3.4 10*3/uL — ABNORMAL HIGH (ref 0.9–3.3)

## 2013-06-28 LAB — PSA: PSA: 0.42 ng/mL (ref <=4.00)

## 2013-06-28 NOTE — Progress Notes (Signed)
[name]  Telephone:(336) [phone] [phone]  OFFICE PROGRESS NOTE  ID: Alec Harris   DOB: July 31, 1946  MR#: [id number]  LKG#:[id number]   PCP: Lavell Islam, MD URO:  Wendie Simmer. Nesi, MD    Principle Diagnosis: Alec Harris is a 67 y.o. gentleman with castration resistant prostate cancer. Originally diagnosed in 2003 with Gleason score 4 + 4 = 8.    Prior Therapy:  Alec Harris was initially diagnosed in 2003 with an elevated PSA. He underwent a prostatectomy with a pathology that showed prostatic adenocarcinoma. Gleason score 4+4 equals 8, with the tumor involving the margins and the pathologic staging was pT4 N1 with 1 of 2 left pelvic lymph nodes involved. He was started on adjuvant hormone therapy with Lupron. He developed a rising PSA and bone metastasis. He was then treated with ketoconazole and prednisone. He developed a rising PSA with a doubling time of just over 6 months. Bone scan in 04/2012 showed increased uptake in the right lower lumbar spine at L4 and L5 and abnormal uptake in the mid sternum which had progressed since the prior bone scan.   Current therapy: Zytiga 1000 mg PO daily started in 05/2012.   He remains on Lupron every 4 months at [facility].   Interim History:  Alec Harris was seen today for follow up of metastatic prostate cancer.   He has been on Zytiga since 05/2012 and is tolerating this well without any new complications.  Since his last office visit on 05/19/2013 Alec Harris reports a decrease in appetite and ongoing constipation.  He denies any other symptomatology including bone pain and any unusual bleeding.  He received his most recent Lupron injection with Dr. Brunilda Payor at [facility] on 06/27/2013.  Alec Harris is scheduled for left carotid artery stent placement by Dr. Louretta Parma. Myra Gianotti on 07/05/2013.  Smoking cessation was discussed with Alec Harris.  His interval history is otherwise stable.   Medications: I have reviewed  the patient's current medications: Current Outpatient Prescriptions  Medication Sig Dispense Refill  . abiraterone Acetate (ZYTIGA) 250 MG tablet Take 1,000 mg by mouth daily. Take on an empty stomach 1 hour before or 2 hours after a meal      . aspirin EC 81 MG tablet Take 81 mg by mouth daily.      . Calcium Carb-Cholecalciferol (CALCIUM 500 +D) 500-400 MG-UNIT TABS Take 1 tablet by mouth 2 (two) times daily.      . cilostazol (PLETAL) 100 MG tablet Take 100 mg by mouth 2 (two) times daily.      . clopidogrel (PLAVIX) 75 MG tablet Take 75 mg by mouth daily with breakfast. Start taking 75 mg daily on 06/30/13; (to begin 5 days prior to procedure of 11/18)      . rosuvastatin (CRESTOR) 10 MG tablet Take 10 mg by mouth at bedtime.       No current facility-administered medications for this visit.    Allergies: No Known Allergies  Past Medical History: Past Medical History  Diagnosis Date  . Prostate cancer   . Rectal bleeding 07/24/11    Diverticular Bleeding  . PAD (peripheral artery disease) 01/2012    severe diffuse his iliacs,SFA, carotids  . Carotid artery disease     By doppler  . Hyperlipemia   . Claudication 02/06/2012    ABI of 0.5 on both legs ,Rgt ext. iliac70 to 99%,rgt SFA occlude,lft ext.iliac common fem 70 to 99% ,Lft SFA occluded,mono flow popliteal  Past Surgical History: Past Surgical History  Procedure Laterality Date  . Knee cartilage surgery      surgery for a torn ligament  . Cervical spine surgery  approx. 10 years ago    disc removed from neck  . Colonoscopy  07/25/2011    Procedure: COLONOSCOPY;  Surgeon: Theda Belfast;  Location: [facility];  Service: Endoscopy;  Laterality: N/A;  . Prostate surgery  2003    for  prostate cancer  . Penile prosthesis implant    . Endarterectomy  04/08/2012    Procedure: ENDARTERECTOMY CAROTID;  Surgeon: Nada Libman, MD;  Location: [facility] OR;  Service: Vascular;  Laterality: Right;  Right carotid endarterectomy with  vascuguard 1cm x 6cm bovine patch angioplasty.   . Carotid angiogram  03/02/2012    bilateral carotid artery stenosis,95% right and 80% left with a type 2 arch  . Pv angiogram  03/02/2012    total SFAs bilaterally with 3- vessel runoff below the knee, high-grade calcified iliac diseaese  . Doppler echocardiography  03/24/2012    EF >70% LV normal  . Lexiscan myoview  01/14/2012    small fixed basal to mid inferior bowel attenuation artifact. no reversible ischemia  . Carotid endarterectomy Right 04-08-12    CEA    Family History: Family History  Problem Relation Age of Onset  . Hypertension Mother   . Heart attack Mother   . Heart attack Father   . Heart disease Father     Irregular Heart beat, Heart Disease before age 59  . Cancer Brother     throat   Social History: History  Substance Use Topics  . Smoking status: Current Every Day Smoker -- 50 years    Types: Cigarettes  . Smokeless tobacco: Never Used     Comment: pt states that he is going to quit completely; 1/2 pack per day  . Alcohol Use: 3.0 oz/week    3 Cans of beer, 2 Shots of liquor per week    Review of Systems: A 10 point review of systems was completed and is negative except as stated above.  Alec Harris denies any other symptomatology including  fatigue, fever or chills, headache, vision changes, swollen glands, cough or shortness of breath, chest pain or discomfort, nausea, vomiting, diarrhea, any other changes in urinary or bowel habits, any arthralgias/myalgias, unusual bleeding/bruising or any other symptomatology.   Physical Exam: Blood pressure 183/78, pulse 90, temperature 98.6 F (37 C), temperature source Oral, resp. rate 18, height 5\' 9"  (1.753 m), weight 212 lb 6.4 oz (96.344 kg).  ECOG FS: 1 - Symptomatic but completely ambulatory  General appearance: Alert, cooperative, well nourished, no apparent distress Head: Normocephalic, without obvious abnormality, atraumatic, numerous dental caries Eyes:  Arcus senilis Conjunctivae/corneas clear, PERRLA, EOMI Nose: Nares, septum and mucosa are normal, no drainage or sinus tenderness Neck: No adenopathy, supple, symmetrical, trachea midline, no tenderness Resp: Clear to auscultation bilaterally, no wheezes/rales/rhonchi Cardio: Regular rate and rhythm, S1, S2 normal, 2/6 systolic murmur, no click, rub or gallop, no edema GI: Soft, distended, non-tender, normoactive bowel sounds, no organomegaly Skin: No rashes/lesions, skin warm and dry, no erythematous areas, no cyanosis  M/S:  Atraumatic, normal strength in all extremities, normal range of motion, bilateral fingertip clubbing  Lymph nodes: Cervical, supraclavicular, and axillary nodes normal Neurologic: Grossly normal, cranial nerves II through XII intact, alert and oriented x 3 Psych: Appropriate affect   Lab Results: Lab Results  Component Value Date   WBC 8.6 06/28/2013  HGB 13.6 06/28/2013   HCT 40.9 06/28/2013   MCV 92.4 06/28/2013   PLT 151 06/28/2013     Chemistry      Component Value Date/Time   NA 140 06/28/2013 0857   NA 139 04/09/2012 0500   K 3.9 06/28/2013 0857   K 4.0 04/09/2012 0500   CL 106 02/01/2013 1120   CL 106 04/09/2012 0500   CO2 24 06/28/2013 0857   CO2 27 04/09/2012 0500   BUN 11.0 06/28/2013 0857   BUN 12 04/09/2012 0500   CREATININE 0.8 06/28/2013 0857   CREATININE 0.93 04/09/2012 0500      Component Value Date/Time   CALCIUM 9.3 06/28/2013 0857   CALCIUM 8.6 04/09/2012 0500   ALKPHOS 92 06/28/2013 0857   ALKPHOS 96 01/14/2013 0952   AST 28 06/28/2013 0857   AST 15 01/14/2013 0952   ALT 44 06/28/2013 0857   ALT 17 01/14/2013 0952   BILITOT 0.70 06/28/2013 0857   BILITOT 0.7 01/14/2013 0952      Results for ALIJAH, AKRAM (MRN [id number])   Ref. Range 02/01/2013 11:20 03/08/2013 08:30 04/13/2013 09:21  05/19/2013  09:31   06/28/2013   08:57   PSA Latest Range: <=4.00 ng/mL 0.56 0.56 0.48  0.47   0.42    Impression and Plan:   Alec Harris is a 67  y.o. gentleman with:  1. Castration resistant prostate cancer. Currently on Zytiga 1000 mg by mouth daily and tolerating it well.  He will continue Zytiga without dose modification. PSA has shown a steady downward trend since 03/08/2013.  Smoking cessation has been encouraged.  2. Androgen deprivation. He remains on Lupron injections under the care of Dr. Brunilda Payor [facility].  Most recent Lupron injection administered on 06/27/2013.  3. Bone directed therapy.  He has very minimal bone involvement, but we may consider this down the line.  Alec Harris was instructed to continue calcium and vitamin D supplements.  4.  Constipation: Mr. Lambert was encouraged to implement a bowel regimen including increasing oral hydration with water, eating a balanced diet that his fiber rich, and using a daily stool softener to avoid constipation.  5. We plan to see Alec Harris in about 4 weeks at which time we will check laboratories of CBC, CMP, and PSA.  All questions answered.  Alec Harris encouraged to contact us in the interim with any questions, concerns, or problems.  Alesi Zachery 11/12/201411:34 AM

## 2013-06-28 NOTE — Patient Instructions (Addendum)
Please contact us at [phone] if you have any questions or concerns.  Please continue to do well and enjoy life!!!  Get plenty of rest, drink plenty of water, exercise daily (walking), eat a balanced diet.  Continue taking calcium and vitamin D daily.  Use stool softener daily to avoid constipation.  (Colace, Senokot)  Work toward not smoking.  Results for orders placed in visit on 06/28/13 (from the past 24 hour(s))  CBC WITH DIFFERENTIAL     Status: Abnormal   Collection Time    06/28/13  8:57 AM      Result Value Range   WBC 8.6  4.0 - 10.3 10e3/uL   NEUT# 4.0  1.5 - 6.5 10e3/uL   HGB 13.6  13.0 - 17.1 g/dL   HCT 45.4  09.8 - 11.9 %   Platelets 151  140 - 400 10e3/uL   MCV 92.4  79.3 - 98.0 fL   MCH 30.8  27.2 - 33.4 pg   MCHC 33.3  32.0 - 36.0 g/dL   RBC 1.47  8.29 - 5.62 10e6/uL   RDW 14.6  11.0 - 14.6 %   lymph# 3.4 (*) 0.9 - 3.3 10e3/uL   MONO# 0.9  0.1 - 0.9 10e3/uL   Eosinophils Absolute 0.2  0.0 - 0.5 10e3/uL   Basophils Absolute 0.1  0.0 - 0.1 10e3/uL   NEUT% 46.6  39.0 - 75.0 %   LYMPH% 39.8  14.0 - 49.0 %   MONO% 10.8  0.0 - 14.0 %   EOS% 1.9  0.0 - 7.0 %   BASO% 0.9  0.0 - 2.0 %   Narrative:    Performed At:  [facility]               [address]               [place], [place] [address]  COMPREHENSIVE METABOLIC PANEL (CC13)     Status: Abnormal   Collection Time    06/28/13  8:57 AM      Result Value Range   Sodium 140  136 - 145 mEq/L   Potassium 3.9  3.5 - 5.1 mEq/L   Chloride 108  98 - 109 mEq/L   CO2 24  22 - 29 mEq/L   Glucose 109  70 - 140 mg/dl   BUN 57.8  7.0 - 46.9 mg/dL   Creatinine 0.8  0.7 - 1.3 mg/dL   Total Bilirubin 6.29  0.20 - 1.20 mg/dL   Alkaline Phosphatase 92  40 - 150 U/L   AST 28  5 - 34 U/L   ALT 44  0 - 55 U/L   Total Protein 6.6  6.4 - 8.3 g/dL   Albumin 3.3 (*) 3.5 - 5.0 g/dL   Calcium 9.3  8.4 - 52.8 mg/dL   Anion Gap 8  3 - 11 mEq/L   Narrative:    Performed At:  [facility]                [address]               [place], [place] [address]

## 2013-06-28 NOTE — Telephone Encounter (Signed)
gv pt appt schedule for December 2014 thru February 2015. Per 11/11 pof sent by [facility] pt to have lb/FS q4w.

## 2013-06-30 ENCOUNTER — Telehealth: Payer: Self-pay | Admitting: *Deleted

## 2013-06-30 NOTE — Telephone Encounter (Signed)
Spoke with patient, gave results of latest PSA 

## 2013-07-05 ENCOUNTER — Encounter (HOSPITAL_COMMUNITY): Payer: Self-pay | Admitting: *Deleted

## 2013-07-05 ENCOUNTER — Telehealth: Payer: Self-pay | Admitting: Surgery

## 2013-07-05 ENCOUNTER — Inpatient Hospital Stay (HOSPITAL_COMMUNITY)
Admission: RE | Admit: 2013-07-05 | Discharge: 2013-07-06 | DRG: 036 | Disposition: A | Discharge status: Home / Self Care | Payer: Medicare Other | Source: Ambulatory Visit | Attending: Surgery | Admitting: Surgery

## 2013-07-05 ENCOUNTER — Encounter (HOSPITAL_COMMUNITY)
Admission: RE | Disposition: A | Discharge status: Home / Self Care | Payer: Medicare Other | Source: Ambulatory Visit | Attending: Surgery

## 2013-07-05 DIAGNOSIS — I6529 Occlusion and stenosis of unspecified carotid artery: Principal | ICD-10-CM | POA: Diagnosis present

## 2013-07-05 DIAGNOSIS — Z7982 Long term (current) use of aspirin: Secondary | ICD-10-CM

## 2013-07-05 DIAGNOSIS — Z8546 Personal history of malignant neoplasm of prostate: Secondary | ICD-10-CM

## 2013-07-05 DIAGNOSIS — I70219 Atherosclerosis of native arteries of extremities with intermittent claudication, unspecified extremity: Secondary | ICD-10-CM | POA: Diagnosis present

## 2013-07-05 DIAGNOSIS — E785 Hyperlipidemia, unspecified: Secondary | ICD-10-CM | POA: Diagnosis present

## 2013-07-05 DIAGNOSIS — F17200 Nicotine dependence, unspecified, uncomplicated: Secondary | ICD-10-CM | POA: Diagnosis present

## 2013-07-05 HISTORY — PX: CAROTID STENT INSERTION: SHX5505

## 2013-07-05 LAB — POCT I-STAT, CHEM 8
BUN: 11 mg/dL (ref 6–23)
Calcium, Ion: 1.27 mmol/L (ref 1.13–1.30)
Creatinine, Ser: 1 mg/dL (ref 0.50–1.35)
Glucose, Bld: 123 mg/dL — ABNORMAL HIGH (ref 70–99)
Sodium: 144 mEq/L (ref 135–145)
TCO2: 22 mmol/L (ref 0–100)

## 2013-07-05 SURGERY — CAROTID STENT INSERTION
Anesthesia: LOCAL | Laterality: Left

## 2013-07-05 MED ORDER — NOREPINEPHRINE BITARTRATE 1 MG/ML IJ SOLN
INTRAMUSCULAR | Status: AC
  Filled 2013-07-05: qty 4

## 2013-07-05 MED ORDER — SODIUM CHLORIDE 0.9 % IV SOLN
INTRAVENOUS | Status: DC
  Administered 2013-07-05: 1000 mL via INTRAVENOUS

## 2013-07-05 MED ORDER — GUAIFENESIN-DM 100-10 MG/5ML PO SYRP: 15.0000 mL | ORAL_SOLUTION | ORAL | Status: DC | PRN

## 2013-07-05 MED ORDER — HYDRALAZINE HCL 20 MG/ML IJ SOLN: 10.0000 mg | INTRAMUSCULAR | Status: DC | PRN

## 2013-07-05 MED ORDER — MORPHINE SULFATE 10 MG/ML IJ SOLN: 2.0000 mg | INTRAMUSCULAR | Status: DC | PRN

## 2013-07-05 MED ORDER — MORPHINE SULFATE 2 MG/ML IJ SOLN: 2.0000 mg | INTRAMUSCULAR | Status: DC | PRN

## 2013-07-05 MED ORDER — NITROGLYCERIN IN D5W 200-5 MCG/ML-% IV SOLN
INTRAVENOUS | Status: AC
  Filled 2013-07-05: qty 250

## 2013-07-05 MED ORDER — SODIUM CHLORIDE 0.9 % IV SOLN
1.7500 mg/kg/h | INTRAVENOUS | Status: DC
  Filled 2013-07-05 (×2): qty 250

## 2013-07-05 MED ORDER — ONDANSETRON HCL 4 MG/2ML IJ SOLN: 4.0000 mg | Freq: Four times a day (QID) | INTRAMUSCULAR | Status: DC | PRN

## 2013-07-05 MED ORDER — LABETALOL HCL 5 MG/ML IV SOLN
10.0000 mg | INTRAVENOUS | Status: DC | PRN
  Administered 2013-07-05 (×2): 10 mg via INTRAVENOUS
  Filled 2013-07-05 (×2): qty 4

## 2013-07-05 MED ORDER — OXYCODONE-ACETAMINOPHEN 5-325 MG PO TABS
1.0000 | ORAL_TABLET | ORAL | Status: DC | PRN
  Administered 2013-07-06: 2 via ORAL
  Filled 2013-07-05: qty 2

## 2013-07-05 MED ORDER — ACETAMINOPHEN 325 MG PO TABS: 325.0000 mg | ORAL_TABLET | ORAL | Status: DC | PRN

## 2013-07-05 MED ORDER — PHENOL 1.4 % MT LIQD: 1.0000 | OROMUCOSAL | Status: DC | PRN

## 2013-07-05 MED ORDER — ACETAMINOPHEN 650 MG RE SUPP: 325.0000 mg | RECTAL | Status: DC | PRN

## 2013-07-05 MED ORDER — ATROPINE SULFATE 0.1 MG/ML IJ SOLN
INTRAMUSCULAR | Status: AC
  Filled 2013-07-05: qty 10

## 2013-07-05 MED ORDER — HEPARIN (PORCINE) IN NACL 2-0.9 UNIT/ML-% IJ SOLN
INTRAMUSCULAR | Status: AC
  Filled 2013-07-05: qty 1000

## 2013-07-05 MED ORDER — SODIUM CHLORIDE 0.9 % IV SOLN: INTRAVENOUS | Status: DC

## 2013-07-05 MED ORDER — BIVALIRUDIN 250 MG IV SOLR
INTRAVENOUS | Status: AC
  Filled 2013-07-05: qty 250

## 2013-07-05 MED ORDER — HEPARIN (PORCINE) IN NACL 2-0.9 UNIT/ML-% IJ SOLN
INTRAMUSCULAR | Status: AC
  Filled 2013-07-05: qty 500

## 2013-07-05 MED ORDER — LIDOCAINE HCL (PF) 1 % IJ SOLN
INTRAMUSCULAR | Status: AC
  Filled 2013-07-05: qty 30

## 2013-07-05 MED ORDER — ALUM & MAG HYDROXIDE-SIMETH 200-200-20 MG/5ML PO SUSP: 15.0000 mL | ORAL | Status: DC | PRN

## 2013-07-05 MED ORDER — METOPROLOL TARTRATE 1 MG/ML IV SOLN: 2.0000 mg | INTRAVENOUS | Status: DC | PRN

## 2013-07-05 MED ORDER — HYDRALAZINE HCL 20 MG/ML IJ SOLN
INTRAMUSCULAR | Status: AC
  Filled 2013-07-05: qty 1

## 2013-07-05 NOTE — Progress Notes (Signed)
Utilization review completed.  

## 2013-07-05 NOTE — Progress Notes (Addendum)
Patient received to room 3s bed11 from cath lab. Patient is alert and oriented, able to move self over to our bed. Rt. Groin site is level 0 and dressing is clean dry and intact. The dressing was changed earlier in the cath lab. Patient is neuro intact and moves all extremeties. Blood pressure is elevated will reassess in . Wife is at the bedside, both have been settled and oriented to the unit.Blood pressure  180/76. Patient has received 10mg . Of labetatol IV as ordered, will continue to monitor vitals every 30 minutes.

## 2013-07-05 NOTE — Op Note (Signed)
Patient name: Alec Harris MRN: [id number] DOB: 25-Jul-1946 Sex: male  07/05/2013 Pre-operative Diagnosis: Asymptomatic left carotid stenosis Post-operative diagnosis:  Same Surgeon:  Jorge Ny, Nanetta Batty Procedure Performed:  1.  ultrasound-guided access, right femoral artery  2.  Ltd. iliac angiogram, right  3.  aortic arch angiogram  4.  left carotid stem with distal embolic protection  5.  closure device     Indications:  The patient was found to have progressive left carotid stenosis by ultrasound.  It is now greater than 80%.  He is asymptomatic.  I repeated my operative notes from his right carotid endarterectomy in the lesion was very difficult and high.  I felt carotid stenting would be a better approach given the and location of his lesion.  Preoperative insurance evaluation was obtained.  Procedure:  The patient was identified in the holding area and taken to room 8.  The patient was then placed supine on the table and prepped and draped in the usual sterile fashion.  A time out was called.  Ultrasound was used to evaluate the right common femoral artery.  It was patent .  A digital ultrasound image was acquired.  A micropuncture needle was used to access the right common femoral artery under ultrasound guidance.  An 018 wire was advanced without resistance and a micropuncture sheath was placed.  The 018 wire was removed and a benson wire was placed.  I had difficulty advancing the Bentson wire into the aorta and therefore a retrograde pager and was performed through the sheath.  This identified iliac stenosis.  I then used a Glidewire to navigate through this and placed a 6 [place] sheath.  I had to up size the 6 [place] to a 7 [place] sheath because of bleeding around the sheath.  Ultimately I was able to get a catheter into the descending aortic arch, and an aortic arteriogram was performed.  Next using a Berenstein 2 catheter the left carotid artery was selected and a  left carotid antegrade was performed with intracranial imaging.  The intracranial imaging will be separately dictated by the neuroradiology.  Findings:   Aortic arch:  A type I aortic arch is identified.  No significant stenosis is identified within the innominate artery or the proximal left common and subclavian arteries.  The previous right carotid endarterectomy appears to be patent.  Left carotid:  The left common carotid artery is patent throughout it's course.  The left external carotid artery is patent.  Within the left internal carotid artery approximately 1 cm into the artery there is a 2 cm lesion of greater than 80%.   Intervention:  After the above images were acquired, the decision was made to proceed with intervention.  A long 7 French sheath was advanced up into the descending thoracic aorta.  Using a JB 1 catheter the left common carotid artery was selected.  An Amplatz superstiff wire was advanced.  The patient was given an Angiomax bolus with continuous infusion.  ACT was confirmed to be greater than 300.  The sheath was then advanced over the catheter into the distal left common carotid artery.  Then, a large 6 filter was placed.  The stenosis was predilated with a 3 x 2 balloon.  An Abbott XACT 10 x 8 x 40 stent was selected.  This was deployed within the internal carotid artery centered over the lesion.  It was molded with a 5 mm balloon.  Completion angiography revealed resolution of the  stenosis within the carotid artery.  The filter was then retrieved.  Completion imaging was performed which showed excellent results and stenosis down to less than 10%.  I switched out the long sheath for a short 7 [place] sheath.  There was continued oozing from around the sheath and therefore I elected to close the arteriotomy.  This was done with 2 pro-glide devices.  The patient remained neurologically intact after the procedure.  Impression:  #1  successful stenting of the left carotid artery with  distal embolic protection using an Abbott 10 x 8 x 40   V. Durene Cal, M.D. [facility] [place] Office: [phone] Pager:  [phone]

## 2013-07-05 NOTE — Telephone Encounter (Addendum)
Message copied by Fredrich Birks on Tue Jul 05, 2013  2:04 PM ------      Message from: Melene Plan      Created: Tue Jul 05, 2013  1:57 PM                   ----- Message -----         From: Nada Libman, MD         Sent: 07/05/2013   9:57 AM           To: Reuel Derby, Melene Plan, RN            07/05/2013:            Surgeon:  Esperanza Heir      Procedure Performed:       1.  ultrasound-guided access, right femoral artery       2.  Ltd. iliac angiogram, right       3.  aortic arch angiogram       4.  left carotid stem with distal embolic protection       5.  closure device                   I will be detailing position for the entire procedure.  Please schedule the patient to followup with me in one month with a carotid ultrasound. ------  07/05/13: lm for patient, dpm

## 2013-07-05 NOTE — H&P (Signed)
[facility]  Patient name: Alec Harris MRN: [id number] DOB: 1945/10/09 Sex: male  Chief Complaint   Patient presents with   .  Re-evaluation     1 wk f /u post ENT visit     HISTORY OF PRESENT ILLNESS:  The patient is back today for followup. He is status post right carotid endarterectomy with patch angioplasty on 04/08/2012. This was done for asymptomatic carotid stenosis. The stenosis on the left side has now progressed to greater than 80%. Because of the lesion location on the right which is similar to the lesion on the left (very high at the level of the mandible), I have been considering carotid stenting. The patient was evaluated by ENT to make sure his vocal cords were functioning properly, which they were. He remained asymptomatic.  Past Medical History   Diagnosis  Date   .  Prostate cancer    .  Rectal bleeding  07/24/11     Diverticular Bleeding   .  PAD (peripheral artery disease)  01/2012     severe diffuse his iliacs,SFA, carotids   .  Carotid artery disease      By doppler   .  Hyperlipemia    .  Claudication  02/06/2012     ABI of 0.5 on both legs ,Rgt ext. iliac70 to 99%,rgt SFA occlude,lft ext.iliac common fem 70 to 99% ,Lft SFA occluded,mono flow popliteal    Past Surgical History   Procedure  Laterality  Date   .  Knee cartilage surgery       surgery for a torn ligament   .  Cervical spine surgery   approx. 10 years ago     disc removed from neck   .  Colonoscopy   07/25/2011     Procedure: COLONOSCOPY; Surgeon: Theda Belfast; Location: [facility]; Service: Endoscopy; Laterality: N/A;   .  Prostate surgery   2003     for prostate cancer   .  Penile prosthesis implant     .  Endarterectomy   04/08/2012     Procedure: ENDARTERECTOMY CAROTID; Surgeon: Nada Libman, MD; Location: [facility] OR; Service: Vascular; Laterality: Right; Right carotid endarterectomy with vascuguard 1cm x 6cm bovine patch angioplasty.   .  Carotid angiogram    03/02/2012     bilateral carotid artery stenosis,95% right and 80% left with a type 2 arch   .  Pv angiogram   03/02/2012     total SFAs bilaterally with 3- vessel runoff below the knee, high-grade calcified iliac diseaese   .  Doppler echocardiography   03/24/2012     EF >70% LV normal   .  Lexiscan myoview   01/14/2012     small fixed basal to mid inferior bowel attenuation artifact. no reversible ischemia   .  Carotid endarterectomy  Right  04-08-12     CEA    History    Social History   .  Marital Status:  Married     Spouse Name:  N/A     Number of Children:  N/A   .  Years of Education:  N/A    Occupational History   .  Not on file.    Social History Main Topics   .  Smoking status:  Current Every Day Smoker -- 50 years     Types:  Cigarettes   .  Smokeless tobacco:  Never Used      Comment: pt states that  he is going to quit completely; 1/2 pack per day   .  Alcohol Use:  3.0 oz/week     3 Cans of beer, 2 Shots of liquor per week   .  Drug Use:  No   .  Sexual Activity:  No      Comment: smokes 5 cigarettes per day....    Other Topics  Concern   .  Not on file    Social History Narrative   .  No narrative on file    Family History   Problem  Relation  Age of Onset   .  Hypertension  Mother    .  Heart attack  Mother    .  Heart attack  Father    .  Heart disease  Father      Irregular Heart beat, Heart Disease before age 27   .  Cancer  Brother      throat    Allergies as of 05/09/2013   .  (No Known Allergies)    Current Outpatient Prescriptions on File Prior to Visit   Medication  Sig  Dispense  Refill   .  abiraterone Acetate (ZYTIGA) 250 MG tablet  Take 4 tablets (1,000 mg total) by mouth daily. Take on an empty stomach 1 hour before or 2 hours after a meal  120 tablet  0   .  aspirin 81 MG chewable tablet  Chew 81 mg by mouth daily.     .  Calcium Carb-Cholecalciferol (CALCIUM 500 +D) 500-400 MG-UNIT TABS  Take 1 tablet by mouth 2 (two) times daily.      .  cilostazol (PLETAL) 100 MG tablet  TAKE 1 TABLET TWICE DAILY  60 tablet  11    No current facility-administered medications on file prior to visit.    REVIEW OF SYSTEMS:  No changes from previous visits   PHYSICAL EXAMINATION:  Vital signs are BP 172/69  Pulse 70  Ht 5\' 9"  (1.753 m)  Wt 215 lb (97.523 kg)  BMI 31.74 kg/m2  SpO2 99%  General: The patient appears their stated age.  HEENT: No gross abnormalities  Pulmonary: Non labored breathing  Musculoskeletal: There are no major deformities.  Neurologic: No focal weakness or paresthesias are detected,  Skin: There are no ulcer or rashes noted.  Psychiatric: The patient has normal affect.  Cardiovascular: There is a regular rate and rhythm without significant murmur appreciated. Bilateral carotid bruit   Diagnostic Studies  Greater than 80% carotid stenosis with high bifurcation   Assessment:  Asymptomatic left carotid stenosis  Status post right carotid endarterectomy   Plan:  The patient needs to undergo a left-sided carotid intervention. Surgical treatment of the right side was very difficult given the high bifurcation and distal extent of the lesion. He has a similar appearance on angiography on the left. Therefore, I would prefer to treat this with a carotid stent. However, this will depend on whether or not he can have insurance approval for the procedure. We were quite insurance approval. If he is approved for carotid stenting, I will need to initiate Plavix 5 days prior to his operation. If he is not approved for carotid stenting, I would proceed with left carotid endarterectomy. He will need to be off of his Pletal for the endarterectomy 5 days prior to his operation. The risks and benefits of the procedures were discussed with the patient. We will be in contact with him regarding the approved intervention for his carotid disease.  Jorge Ny, M.D.  [facility] [place]  Office:  [phone]  Pager: [phone]

## 2013-07-05 NOTE — Progress Notes (Signed)
Received Labetalol 10mg . IV as ordered

## 2013-07-06 MED ORDER — OXYCODONE-ACETAMINOPHEN 5-325 MG PO TABS: 1.0000 | ORAL_TABLET | ORAL | Status: DC | PRN

## 2013-07-06 MED FILL — Sodium Chloride IV Soln 0.9%: INTRAVENOUS | Qty: 50 | Status: AC

## 2013-07-06 NOTE — Progress Notes (Signed)
Pt. D/C to home; paperwork in hand as well as prescription for pain medicine.    Vivi Martens RN

## 2013-07-06 NOTE — Discharge Summary (Signed)
[facility] Discharge Summary   Patient ID:  Alec Harris MRN: [id number] DOB/AGE: 69-Sep-1947 69 y.o.  Admit date: 07/05/2013 Discharge date: 07/06/2013 Date of Surgery: 07/05/2013 Surgeon: Surgeon(s): Nada Libman, MD [name], MD  Admission Diagnosis: Stenosis  Discharge Diagnoses:  Stenosis  Secondary Diagnoses: Past Medical History  Diagnosis Date  . Prostate cancer   . Rectal bleeding 07/24/11    Diverticular Bleeding  . PAD (peripheral artery disease) 01/2012    severe diffuse his iliacs,SFA, carotids  . Carotid artery disease     By doppler  . Hyperlipemia   . Claudication 02/06/2012    ABI of 0.5 on both legs ,Rgt ext. iliac70 to 99%,rgt SFA occlude,lft ext.iliac common fem 70 to 99% ,Lft SFA occluded,mono flow popliteal    Procedure(s): Left CAROTID STENT INSERTION  Discharged Condition: good  HPI:  Alec Harris is a 67 y.o. male who is status post right carotid endarterectomy with patch angioplasty on 04/08/2012. This was done for asymptomatic carotid stenosis. The stenosis on the left side has now progressed to greater than 80%. Because of the lesion location on the right which is similar to the lesion on the left (very high at the level of the mandible),  considering carotid stenting. The patient was evaluated by ENT to make sure his vocal cords were functioning properly, which they were. He remained asymptomatic. He is admitted for left carotid stent    Hospital Course:  Alec Harris is a 67 y.o. male is S/P Left CAROTID STENT INSERTION Physical exam: Pt is A&O x 3 Gait is normal Speech is fluent right groin Wound is healing well without hematoma, has min ecchymosis Patient with Negative tongue deviation and Negative facial droop Pt has good and equal strength in all extremities   Post-op wounds healing well Pt. Ambulating, voiding and taking PO diet without difficulty. Pt pain controlled with PO pain meds. Labs as  below Complications:none  Consults:     Significant Diagnostic Studies: CBC Lab Results  Component Value Date   WBC 8.6 06/28/2013   HGB 14.6 07/05/2013   HCT 43.0 07/05/2013   MCV 92.4 06/28/2013   PLT 151 06/28/2013    BMET    Component Value Date/Time   NA 144 07/05/2013 0609   NA 140 06/28/2013 0857   K 3.7 07/05/2013 0609   K 3.9 06/28/2013 0857   CL 106 07/05/2013 0609   CL 106 02/01/2013 1120   CO2 24 06/28/2013 0857   CO2 27 04/09/2012 0500   GLUCOSE 123* 07/05/2013 0609   GLUCOSE 109 06/28/2013 0857   GLUCOSE 130* 02/01/2013 1120   BUN 11 07/05/2013 0609   BUN 11.0 06/28/2013 0857   CREATININE 1.00 07/05/2013 0609   CREATININE 0.8 06/28/2013 0857   CALCIUM 9.3 06/28/2013 0857   CALCIUM 8.6 04/09/2012 0500   GFRNONAA 84* 04/09/2012 0500   GFRAA >90 04/09/2012 0500   COAG Lab Results  Component Value Date   INR 0.90 04/02/2012   INR 1.01 07/24/2011     Disposition:  Discharge to :Home Discharge Orders   Future Appointments Provider Department Dept Phone   07/26/2013 11:30 AM Dava Najjar [facility] MEDICAL ONCOLOGY [phone]   07/26/2013 12:00 PM Benjiman Core, MD [facility] MEDICAL ONCOLOGY [phone]   08/15/2013 11:00 AM [facility] [place] [facility]ARD[facility]ULAR IM[facility] [address] [phone]   08/15/2013 12:00 PM Nada Libman, MD [facility] [phone]   08/23/2013 11:30  AM Sherrie Mustache [facility] MEDICAL ONCOLOGY [phone]   08/23/2013 12:00 PM Benjiman Core, MD [facility] MEDICAL ONCOLOGY [phone]   09/20/2013 11:30 AM Dava Najjar [facility] MEDICAL ONCOLOGY [phone]   09/20/2013 12:00 PM Benjiman Core, MD [place] [facility]COL[facility] [phone]   Future Orders Complete By Expires   Call MD for:  redness, tenderness, or signs of infection (pain, swelling, bleeding, redness, odor or green/yellow discharge around  incision site)  As directed    Call MD for:  severe or increased pain, loss or decreased feeling  in affected limb(s)  As directed    Call MD for:  temperature >100.5  As directed    CAROTID Sugery: Call MD for difficulty swallowing or speaking; weakness in arms or legs that is a new symtom; severe headache.  If you have increased swelling in the neck and/or  are having difficulty breathing, CALL 911  As directed    Driving Restrictions  As directed    Comments:     No driving for 1 weeks   Increase activity slowly  As directed    Comments:     Walk with assistance use walker or cane as needed   Lifting restrictions  As directed    Comments:     No lifting mor than 20 lbs for 4 weeks   May shower   As directed    Scheduling Instructions:     Friday   No dressing needed  As directed    Resume previous diet  As directed        Medication List         abiraterone Acetate 250 MG tablet  Commonly known as:  ZYTIGA  Take 1,000 mg by mouth daily. Take on an empty stomach 1 hour before or 2 hours after a meal     aspirin EC 81 MG tablet  Take 81 mg by mouth daily.     CALCIUM 500 +D 500-400 MG-UNIT Tabs  Generic drug:  Calcium Carb-Cholecalciferol  Take 1 tablet by mouth 2 (two) times daily.     cilostazol 100 MG tablet  Commonly known as:  PLETAL  Take 100 mg by mouth 2 (two) times daily.     clopidogrel 75 MG tablet  Commonly known as:  PLAVIX  Take 75 mg by mouth daily with breakfast. Start taking 75 mg daily on 06/30/13; (to begin 5 days prior to procedure of 11/18)     oxyCODONE-acetaminophen 5-325 MG per tablet  Commonly known as:  PERCOCET/ROXICET  Take 1-2 tablets by mouth every 4 (four) hours as needed for moderate pain.     rosuvastatin 10 MG tablet  Commonly known as:  CRESTOR  Take 10 mg by mouth at bedtime.       Verbal and written Discharge instructions given to the patient. Wound care per Discharge AVS     Follow-up Information   Follow up with Myra Gianotti  IV, Lala Lund, MD In 4 weeks. (office will arrange)    Specialty:  Vascular Surgery   Contact information:   [address] [place] [address] [phone]       Signed: Marlowe Shores 07/06/2013, 8:57 AM

## 2013-07-06 NOTE — Discharge Summary (Signed)
Agree with the above  Alec Harris 

## 2013-07-06 NOTE — Progress Notes (Addendum)
VASCULAR AND VEIN SURGERY POST - OP CAROTID STENT   Date of Surgery: 07/05/2013  Surgeon(s): Nada Libman, MD [name], MD 1 Day Post-Op left carotid stent .  HPI: Alec Harris is a 67 y.o. male who is 1 Day Post-Op . Patient is doing well. Patient denies headache; Patient denies difficulty swallowing; denies weakness in upper or lower extremities; Pt. denies other symptoms of stroke or TIA.  IMAGING: None   Significant Diagnostic Studies: CBC Lab Results  Component Value Date   WBC 8.6 06/28/2013   HGB 14.6 07/05/2013   HCT 43.0 07/05/2013   MCV 92.4 06/28/2013   PLT 151 06/28/2013    BMET    Component Value Date/Time   NA 144 07/05/2013 0609   NA 140 06/28/2013 0857   K 3.7 07/05/2013 0609   K 3.9 06/28/2013 0857   CL 106 07/05/2013 0609   CL 106 02/01/2013 1120   CO2 24 06/28/2013 0857   CO2 27 04/09/2012 0500   GLUCOSE 123* 07/05/2013 0609   GLUCOSE 109 06/28/2013 0857   GLUCOSE 130* 02/01/2013 1120   BUN 11 07/05/2013 0609   BUN 11.0 06/28/2013 0857   CREATININE 1.00 07/05/2013 0609   CREATININE 0.8 06/28/2013 0857   CALCIUM 9.3 06/28/2013 0857   CALCIUM 8.6 04/09/2012 0500   GFRNONAA 84* 04/09/2012 0500   GFRAA >90 04/09/2012 0500    COAG Lab Results  Component Value Date   INR 0.90 04/02/2012   INR 1.01 07/24/2011   No results found for this basename: PTT      Intake/Output Summary (Last 24 hours) at 07/06/13 0850 Last data filed at 07/06/13 0300  Gross per 24 hour  Intake    240 ml  Output    925 ml  Net   -685 ml    Physical Exam:  BP Readings from Last 3 Encounters:  07/06/13 140/84  07/06/13 140/84  06/28/13 183/78   Temp Readings from Last 3 Encounters:  07/06/13 98.4 F (36.9 C) Oral  07/06/13 98.4 F (36.9 C) Oral  06/28/13 98.6 F (37 C) Oral   SpO2 Readings from Last 3 Encounters:  07/06/13 96%  07/06/13 96%  05/09/13 99%   Pulse Readings from Last 3 Encounters:  07/06/13 67  07/06/13 67  06/28/13 90     Pt is A&O x 3 Gait is normal Speech is fluent right groin Wound is healing well Patient with Negative tongue deviation and Negative facial droop Pt has good and equal strength in all extremities  Assessment/Plan:: Alec Harris is a 66 y.o. male is S/P Left carotid stent Pt is voiding, ambulating and taking po well    Discharge to: Home Follow-up in 4 weeks Cont plavix/ASA/Pletal   ROCZNIAK,REGINA J  07/06/2013 8:50 AM  Neuro intact No groin hematoma Doing well  D/c home  Fabienne Bruns, MD [facility] [place] Office: [phone] Pager: [phone]

## 2013-07-15 ENCOUNTER — Other Ambulatory Visit: Payer: Self-pay | Admitting: Cardiovascular Disease

## 2013-07-18 NOTE — Telephone Encounter (Signed)
Rx was sent to pharmacy electronically. 

## 2013-07-19 ENCOUNTER — Other Ambulatory Visit: Payer: Self-pay | Admitting: *Deleted

## 2013-07-19 MED ORDER — ABIRATERONE ACETATE 250 MG PO TABS: 1000.0000 mg | ORAL_TABLET | Freq: Every day | ORAL | Status: DC

## 2013-07-19 NOTE — Consult Note (Signed)
Alec Harris, Alec Harris                ACCOUNT NO.:  [id number]  MEDICAL RECORD NO.:  [id number]  LOCATION:  3S11C                        FACILITY:  [facility]  PHYSICIAN:  Jenniah Bhavsar K. Purl Claytor, M.D.DATE OF BIRTH:  [birth year]  DATE OF CONSULTATION: DATE OF DISCHARGE:  07/06/2013                                CONSULTATION   EXAMINATION:  Intracranial interpretation of left common carotid arteriogram before and after placement of proximal stent.  The pre-stent placement angiogram demonstrates the petrous, cavernous, and supraclinoid left ICA to be widely patent.  Slow flow is seen into the left ophthalmic artery.  The left middle cerebral artery is  seen to opacify into the capillary and the venous phases.  There is slow filling of the left anterior cerebral artery A1 segment. No filling is seen of the distal A2 and tertiary branches.  Post-stent placement angiogram  demonstrates significantly improved caliber and flow of the petrous, cavernous, and supraclinoid segments.  No evidence of intracranial filling defects seen.  There is improved flow into the left anterior cerebral artery  distally.  IMPRESSION:  Post-stent placement in the proximal left internal carotid artery, followed by  Common carotid  angiogram demonstrates improved flow hemodynamically through the left  anterior cerebral artery and the left middle cerebral artery.  No evidence of gross filling defects  or of vessel occlusion is seen.          ______________________________ Grandville Silos Corliss Skains, M.D.     SKD/MEDQ  D:  07/18/2013  T:  07/19/2013  Job:  [id number]

## 2013-07-26 ENCOUNTER — Other Ambulatory Visit: Payer: Medicare Other | Admitting: Lab

## 2013-07-26 ENCOUNTER — Ambulatory Visit: Payer: Medicare Other | Admitting: Oncology

## 2013-08-09 ENCOUNTER — Other Ambulatory Visit: Payer: Self-pay | Admitting: *Deleted

## 2013-08-10 ENCOUNTER — Encounter: Payer: Self-pay | Admitting: Surgery

## 2013-08-15 ENCOUNTER — Other Ambulatory Visit: Payer: Self-pay | Admitting: Surgery

## 2013-08-15 ENCOUNTER — Ambulatory Visit (HOSPITAL_COMMUNITY)
Admit: 2013-08-15 | Discharge: 2013-08-15 | Disposition: A | Discharge status: Home / Self Care | Payer: Medicare Other | Attending: Surgery | Admitting: Surgery

## 2013-08-15 ENCOUNTER — Encounter: Payer: Self-pay | Admitting: Surgery

## 2013-08-15 ENCOUNTER — Ambulatory Visit (INDEPENDENT_AMBULATORY_CARE_PROVIDER_SITE_OTHER): Payer: Self-pay | Admitting: Surgery

## 2013-08-15 DIAGNOSIS — Z48812 Encounter for surgical aftercare following surgery on the circulatory system: Secondary | ICD-10-CM

## 2013-08-15 DIAGNOSIS — I6529 Occlusion and stenosis of unspecified carotid artery: Secondary | ICD-10-CM | POA: Diagnosis not present

## 2013-08-15 NOTE — Progress Notes (Signed)
The patient is back today for followup.  He underwent left carotid stenting with distal embolic protection on 07/05/2013.  He continues to be without any neurologic deficits.  He has previously undergone a right carotid endarterectomy in 04/08/2012.  He does complain of headaches today.  His blood pressure was found to be elevated at 178/117.  He is not on any blood pressure medication.  Neurologically he is intact.  Duplex ultrasound shows a widely patent carotid endarterectomy site as well as carotid stent.  I have encouraged the patient to see his medical doctor today to be started on blood pressure medication.  He promises me he will get this done.  He sees the urgent care on [address].  I tried to contact them but could not find the number.  He'll followup in 6 months with a carotid ultrasound

## 2013-08-15 NOTE — Addendum Note (Signed)
Addended by: Sharee Pimple on: 08/15/2013 04:38 PM   Modules accepted: Orders

## 2013-08-16 DIAGNOSIS — I10 Essential (primary) hypertension: Secondary | ICD-10-CM | POA: Diagnosis not present

## 2013-08-17 ENCOUNTER — Other Ambulatory Visit: Payer: Self-pay | Admitting: *Deleted

## 2013-08-17 MED ORDER — ABIRATERONE ACETATE 250 MG PO TABS: 1000.0000 mg | ORAL_TABLET | Freq: Every day | ORAL | Status: DC

## 2013-08-17 NOTE — Telephone Encounter (Signed)
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 

## 2013-08-23 ENCOUNTER — Ambulatory Visit (HOSPITAL_BASED_OUTPATIENT_CLINIC_OR_DEPARTMENT_OTHER): Payer: Medicare Other | Admitting: Oncology

## 2013-08-23 ENCOUNTER — Telehealth: Payer: Self-pay | Admitting: Oncology

## 2013-08-23 ENCOUNTER — Other Ambulatory Visit (HOSPITAL_BASED_OUTPATIENT_CLINIC_OR_DEPARTMENT_OTHER): Payer: Medicare Other

## 2013-08-23 ENCOUNTER — Other Ambulatory Visit: Payer: Self-pay | Admitting: Oncology

## 2013-08-23 VITALS — BP 194/72 | HR 84 | Temp 98.2°F | Resp 18 | Ht 69.0 in | Wt 208.1 lb

## 2013-08-23 DIAGNOSIS — C61 Malignant neoplasm of prostate: Secondary | ICD-10-CM

## 2013-08-23 DIAGNOSIS — C7952 Secondary malignant neoplasm of bone marrow: Secondary | ICD-10-CM | POA: Diagnosis not present

## 2013-08-23 DIAGNOSIS — C7951 Secondary malignant neoplasm of bone: Secondary | ICD-10-CM

## 2013-08-23 LAB — CBC WITH DIFFERENTIAL/PLATELET
BASO%: 0.6 % (ref 0.0–2.0)
BASOS ABS: 0 10*3/uL (ref 0.0–0.1)
EOS%: 2.7 % (ref 0.0–7.0)
Eosinophils Absolute: 0.2 10*3/uL (ref 0.0–0.5)
HCT: 36.4 % — ABNORMAL LOW (ref 38.4–49.9)
HGB: 12.2 g/dL — ABNORMAL LOW (ref 13.0–17.1)
LYMPH%: 41.9 % (ref 14.0–49.0)
MCH: 31.8 pg (ref 27.2–33.4)
MCHC: 33.5 g/dL (ref 32.0–36.0)
MCV: 94.8 fL (ref 79.3–98.0)
MONO#: 0.5 10*3/uL (ref 0.1–0.9)
MONO%: 8.2 % (ref 0.0–14.0)
NEUT%: 46.6 % (ref 39.0–75.0)
NEUTROS ABS: 3 10*3/uL (ref 1.5–6.5)
Platelets: 174 10*3/uL (ref 140–400)
RBC: 3.84 10*6/uL — AB (ref 4.20–5.82)
RDW: 15.1 % — ABNORMAL HIGH (ref 11.0–14.6)
WBC: 6.4 10*3/uL (ref 4.0–10.3)
lymph#: 2.7 10*3/uL (ref 0.9–3.3)

## 2013-08-23 LAB — COMPREHENSIVE METABOLIC PANEL (CC13)
ALK PHOS: 87 U/L (ref 40–150)
ALT: 22 U/L (ref 0–55)
AST: 20 U/L (ref 5–34)
Albumin: 3.2 g/dL — ABNORMAL LOW (ref 3.5–5.0)
Anion Gap: 8 mEq/L (ref 3–11)
BUN: 12.1 mg/dL (ref 7.0–26.0)
CO2: 31 mEq/L — ABNORMAL HIGH (ref 22–29)
CREATININE: 0.8 mg/dL (ref 0.7–1.3)
Calcium: 8.8 mg/dL (ref 8.4–10.4)
Chloride: 107 mEq/L (ref 98–109)
Glucose: 142 mg/dl — ABNORMAL HIGH (ref 70–140)
Potassium: 3.2 mEq/L — ABNORMAL LOW (ref 3.5–5.1)
SODIUM: 145 meq/L (ref 136–145)
Total Bilirubin: 0.66 mg/dL (ref 0.20–1.20)
Total Protein: 6.8 g/dL (ref 6.4–8.3)

## 2013-08-23 NOTE — Telephone Encounter (Signed)
Gave pt appt for lab,and MD for March 2015

## 2013-08-23 NOTE — Progress Notes (Signed)
Hematology and Oncology Follow Up Visit  Alec Harris [id number] Jan 05, 1946 68 y.o. 08/23/2013 12:13 PM Alec Harris,Alec Harris, MDCloward, Alec Rossetti, Alec Harris   Principle Diagnosis: 68 year old with Castration resistant prostate cancer. He was diagnosed in 2003 with Gleason score 4 + 4 = 8.   Prior Therapy: He was initially diagnosed in 2003 with an elevated PSA. He underwent a prostatectomy with a pathology that showed prostatic adenocarcinoma. Gleason score 4+4 equals 8, with the tumor involving the margins and the pathologic staging was pT4 N1 with 1 of 2 left pelvic lymph nodes involved. He was started on adjuvant hormone therapy with Lupron. He developed a rising PSA and bone metastasis. He was then treated with ketoconazole and prednisone. He developed a rising PSA with a doubling time of just over 6 months. Bone scan in September 2013 showed increased uptake in the right lower lumbar spine at L4 and L5 and abnormal uptake in the mid sternum which had progressed since the prior bone scan.  Current therapy: Zytiga 1000 mg daily started in October 2013. He remains on Lupron at [facility] urology.  Interim History:  Alec Harris for a routine visit. He has been on Zytiga since 05/2012 and is tolerating this well without any new complications. He denies chest pain, shortness of breath, abdominal pain, nausea, vomiting. No lower extremity edema noted. No back pain. No neurological changes. Denies hematuria or other evidence of bleeding. Appetite and weight are stable.  Appetite and weight stable. He is not reporting any new complications with Zytiga. He is S/P carotid surgery in 06/2013. He is not reporting any bone pain at this.  Medications: I have reviewed the patient's current medications.  Current Outpatient Prescriptions  Medication Sig Dispense Refill  . abiraterone Acetate (ZYTIGA) 250 MG tablet Take 4 tablets (1,000 mg total) by mouth daily. Take on an empty stomach 1 hour before or 2 hours after a  meal  120 tablet  0  . amLODipine (NORVASC) 5 MG tablet Take 5 mg by mouth daily. Patient takes 1/2 pill daily      . aspirin EC 81 MG tablet Take 81 mg by mouth daily.      . Calcium Carb-Cholecalciferol (CALCIUM 500 +D) 500-400 MG-UNIT TABS Take 1 tablet by mouth 2 (two) times daily.      . cilostazol (PLETAL) 100 MG tablet Take 100 mg by mouth 2 (two) times daily.      . clopidogrel (PLAVIX) 75 MG tablet Take 75 mg by mouth daily with breakfast. Start taking 75 mg daily on 06/30/13; (to begin 5 days prior to procedure of 11/18)      . CRESTOR 10 MG tablet TAKE 1 TABLET BY MOUTH AT BEDTIME  30 tablet  5  . oxyCODONE-acetaminophen (PERCOCET/ROXICET) 5-325 MG per tablet Take 1-2 tablets by mouth every 4 (four) hours as needed for moderate pain.  10 tablet  0   No current facility-administered medications for this visit.     Allergies: No Known Allergies  Past Medical History, Surgical history, Social history, and Family History were reviewed and updated.  Review of Systems:  Remaining ROS negative.  Physical Exam: Blood pressure 194/72, pulse 84, temperature 98.2 F (36.8 C), temperature source Oral, resp. rate 18, height 5\' 9"  (1.753 m), weight 208 lb 1.6 oz (94.394 kg). ECOG: 1 General appearance: alert, cooperative and no distress Head: Normocephalic, without obvious abnormality, atraumatic Neck: no adenopathy, no carotid bruit, no JVD, supple, symmetrical, trachea midline and thyroid not enlarged, symmetric,  no tenderness/mass/nodules Lymph nodes: Cervical, supraclavicular, and axillary nodes normal. Heart:regular rate and rhythm, S1, S2 normal, no murmur, click, rub or gallop Lung:chest clear, no wheezing, rales, normal symmetric air entry, no tachypnea, retractions or cyanosis Abdomen: soft, non-tender, without masses or organomegaly EXT:no erythema, induration, or nodules   Lab Results: Lab Results  Component Value Date   WBC 6.4 08/23/2013   HGB 12.2* 08/23/2013   HCT  36.4* 08/23/2013   MCV 94.8 08/23/2013   PLT 174 08/23/2013     Chemistry      Component Value Date/Time   NA 144 07/05/2013 0609   NA 140 06/28/2013 0857   K 3.7 07/05/2013 0609   K 3.9 06/28/2013 0857   CL 106 07/05/2013 0609   CL 106 02/01/2013 1120   CO2 24 06/28/2013 0857   CO2 27 04/09/2012 0500   BUN 11 07/05/2013 0609   BUN 11.0 06/28/2013 0857   CREATININE 1.00 07/05/2013 0609   CREATININE 0.8 06/28/2013 0857      Component Value Date/Time   CALCIUM 9.3 06/28/2013 0857   CALCIUM 8.6 04/09/2012 0500   ALKPHOS 92 06/28/2013 0857   ALKPHOS 96 01/14/2013 0952   AST 28 06/28/2013 0857   AST 15 01/14/2013 0952   ALT 44 06/28/2013 0857   ALT 17 01/14/2013 0952   BILITOT 0.70 06/28/2013 0857   BILITOT 0.7 01/14/2013 0952      Results for GIANG, HEMME (MRN [id number]) as of 08/23/2013 12:04  Ref. Range 05/19/2013 09:31 06/28/2013 08:57  PSA Latest Range: <=4.00 ng/mL 0.47 0.42     Impression and Plan:  This is a 68 year-old gentleman with the following issues:  1. Castration resistant prostate cancer. Currently on Zytiga and tolerating it well. I have recommended that he continue Zytiga without dose modification. PSA is pending today. Last PSA dropped to 0.42 from 6.11(in 05/2012). 2. Androgen deprivation. He remains on Lupron under the care of Dr. Janice Norrie. Last given in June 2014. 3. Bone directed therapy. We will address this with him in the future. He has very minimal bone involvement, but we may consider this down the line. Continue calcium and vitamin D supplements. 4. Followup. In about 4-5 weeks.     [facility] 1/6/201512:13 PM

## 2013-08-26 ENCOUNTER — Encounter: Payer: Self-pay | Admitting: Oncology

## 2013-08-29 ENCOUNTER — Encounter: Payer: Self-pay | Admitting: Oncology

## 2013-08-29 NOTE — Telephone Encounter (Signed)
RECEIVED A FAX FROM [facility] CONCERNING A CONFIRMATION OF PRESCRIPTION SHIPMENT FOR [place] ON 08/26/13.

## 2013-08-29 NOTE — Progress Notes (Signed)
Per PAN-- Zytiga was approved 08/26/13-08/25/14  11,000.00. I will let billing know and send to medical records.

## 2013-09-20 ENCOUNTER — Telehealth: Payer: Self-pay | Admitting: *Deleted

## 2013-09-20 ENCOUNTER — Ambulatory Visit: Payer: Medicare Other | Admitting: Oncology

## 2013-09-20 ENCOUNTER — Other Ambulatory Visit: Payer: Medicare Other

## 2013-09-20 ENCOUNTER — Other Ambulatory Visit: Payer: Self-pay | Admitting: *Deleted

## 2013-09-20 NOTE — Telephone Encounter (Signed)
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

## 2013-09-20 NOTE — Telephone Encounter (Signed)
Left message for patient to call me, re: missed appt today.

## 2013-09-27 ENCOUNTER — Encounter: Payer: Self-pay | Admitting: *Deleted

## 2013-09-27 ENCOUNTER — Other Ambulatory Visit: Payer: Self-pay | Admitting: *Deleted

## 2013-09-27 MED ORDER — ABIRATERONE ACETATE 250 MG PO TABS: 1000.0000 mg | ORAL_TABLET | Freq: Every day | ORAL | Status: DC

## 2013-09-28 ENCOUNTER — Telehealth: Payer: Self-pay | Admitting: Medical Oncology

## 2013-09-28 NOTE — Telephone Encounter (Signed)
Fax received from [facility] stating prescription of Alec Harris is scheduled for delivery.

## 2013-10-21 ENCOUNTER — Other Ambulatory Visit: Payer: Self-pay | Admitting: *Deleted

## 2013-10-21 ENCOUNTER — Other Ambulatory Visit: Payer: Self-pay | Admitting: Medical Oncology

## 2013-10-21 ENCOUNTER — Telehealth: Payer: Self-pay | Admitting: Oncology

## 2013-10-21 ENCOUNTER — Encounter: Payer: Self-pay | Admitting: Oncology

## 2013-10-21 ENCOUNTER — Ambulatory Visit (HOSPITAL_BASED_OUTPATIENT_CLINIC_OR_DEPARTMENT_OTHER): Payer: Medicare Other | Admitting: Oncology

## 2013-10-21 ENCOUNTER — Other Ambulatory Visit (HOSPITAL_BASED_OUTPATIENT_CLINIC_OR_DEPARTMENT_OTHER): Payer: Medicare Other

## 2013-10-21 VITALS — BP 156/65 | HR 79 | Temp 98.1°F | Resp 18 | Ht 69.0 in | Wt 207.6 lb

## 2013-10-21 DIAGNOSIS — C7951 Secondary malignant neoplasm of bone: Secondary | ICD-10-CM | POA: Diagnosis not present

## 2013-10-21 DIAGNOSIS — C61 Malignant neoplasm of prostate: Secondary | ICD-10-CM

## 2013-10-21 DIAGNOSIS — C779 Secondary and unspecified malignant neoplasm of lymph node, unspecified: Secondary | ICD-10-CM | POA: Diagnosis not present

## 2013-10-21 DIAGNOSIS — C7952 Secondary malignant neoplasm of bone marrow: Secondary | ICD-10-CM

## 2013-10-21 LAB — COMPREHENSIVE METABOLIC PANEL (CC13)
ALT: 10 U/L (ref 0–55)
ANION GAP: 8 meq/L (ref 3–11)
AST: 13 U/L (ref 5–34)
Albumin: 3.1 g/dL — ABNORMAL LOW (ref 3.5–5.0)
Alkaline Phosphatase: 90 U/L (ref 40–150)
BILIRUBIN TOTAL: 0.41 mg/dL (ref 0.20–1.20)
BUN: 11.1 mg/dL (ref 7.0–26.0)
CALCIUM: 8.7 mg/dL (ref 8.4–10.4)
CHLORIDE: 111 meq/L — AB (ref 98–109)
CO2: 27 meq/L (ref 22–29)
Creatinine: 0.8 mg/dL (ref 0.7–1.3)
GLUCOSE: 114 mg/dL (ref 70–140)
Potassium: 4 mEq/L (ref 3.5–5.1)
Sodium: 145 mEq/L (ref 136–145)
TOTAL PROTEIN: 6.6 g/dL (ref 6.4–8.3)

## 2013-10-21 LAB — CBC WITH DIFFERENTIAL/PLATELET
BASO%: 0.6 % (ref 0.0–2.0)
Basophils Absolute: 0 10*3/uL (ref 0.0–0.1)
EOS ABS: 0.2 10*3/uL (ref 0.0–0.5)
EOS%: 2.4 % (ref 0.0–7.0)
HEMATOCRIT: 33.9 % — AB (ref 38.4–49.9)
HEMOGLOBIN: 11.3 g/dL — AB (ref 13.0–17.1)
LYMPH#: 3.2 10*3/uL (ref 0.9–3.3)
LYMPH%: 45.5 % (ref 14.0–49.0)
MCH: 31 pg (ref 27.2–33.4)
MCHC: 33.2 g/dL (ref 32.0–36.0)
MCV: 93.4 fL (ref 79.3–98.0)
MONO#: 0.7 10*3/uL (ref 0.1–0.9)
MONO%: 9.4 % (ref 0.0–14.0)
NEUT%: 42.1 % (ref 39.0–75.0)
NEUTROS ABS: 3 10*3/uL (ref 1.5–6.5)
PLATELETS: 212 10*3/uL (ref 140–400)
RBC: 3.63 10*6/uL — ABNORMAL LOW (ref 4.20–5.82)
RDW: 15 % — ABNORMAL HIGH (ref 11.0–14.6)
WBC: 7.1 10*3/uL (ref 4.0–10.3)

## 2013-10-21 LAB — PSA: PSA: 0.4 ng/mL (ref <=4.00)

## 2013-10-21 MED ORDER — ABIRATERONE ACETATE 250 MG PO TABS: 1000.0000 mg | ORAL_TABLET | Freq: Every day | ORAL | Status: DC

## 2013-10-21 NOTE — Telephone Encounter (Signed)
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

## 2013-10-21 NOTE — Telephone Encounter (Signed)
Prescription refill for Zytiga faxed to [facility].

## 2013-10-21 NOTE — Telephone Encounter (Signed)
gv pt appt schedule for april.  °

## 2013-10-21 NOTE — Progress Notes (Signed)
Hematology and Oncology Follow Up Visit  Gavon Majano [id number] May 01, 1946 68 y.o. 10/21/2013 12:06 PM [name],DAVIS L, MDCloward, [name], MD   Principle Diagnosis: 68 year old with Castration resistant prostate cancer. He was diagnosed in 2003 with Gleason score 4 + 4 = 8. Now he has metastatic disease with involvement of lymph nodes and bone.  Prior Therapy: He was initially diagnosed in 2003 with an elevated PSA. He underwent a prostatectomy with a pathology that showed prostatic adenocarcinoma. Gleason score 4+4 equals 8, with the tumor involving the margins and the pathologic staging was pT4 N1 with 1 of 2 left pelvic lymph nodes involved. He was started on adjuvant hormone therapy with Lupron. He developed a rising PSA and bone metastasis. He was then treated with ketoconazole and prednisone. He developed a rising PSA with a doubling time of just over 6 months. Bone scan in September 2013 showed increased uptake in the right lower lumbar spine at L4 and L5 and abnormal uptake in the mid sternum which had progressed since the prior bone scan.  Current therapy: Zytiga 1000 mg daily started in October 2013. He remains on Lupron at [facility] urology.  Interim History:  Mr. Haubner returns for a routine visit. He has been on Zytiga since 05/2012 and is tolerating this well without any new complications. Since his last visit he has not reported any new symptoms. He denies chest pain, shortness of breath, abdominal pain, nausea, vomiting. No lower extremity edema noted. No back pain. No neurological changes. Denies hematuria or other evidence of bleeding. Appetite and weight are stable.  Appetite and weight stable. He is not reporting any new complications with Zytiga. He continues to adhere strictly to [facility] without any missed doses. He takes it in the morning with the appropriate instructions. He has not reported any abdominal pain or lower extremity edema. Has not reported any recent hospitalizations or  illnesses.  Medications: I have reviewed the patient's current medications.  Current Outpatient Prescriptions  Medication Sig Dispense Refill  . abiraterone Acetate (ZYTIGA) 250 MG tablet Take 4 tablets (1,000 mg total) by mouth daily. Take on an empty stomach 1 hour before or 2 hours after a meal  120 tablet  0  . amLODipine (NORVASC) 5 MG tablet Take 5 mg by mouth daily.       [name] aspirin EC 81 MG tablet Take 81 mg by mouth daily.      . Calcium Carb-Cholecalciferol (CALCIUM 500 +D) 500-400 MG-UNIT TABS Take 1 tablet by mouth 2 (two) times daily.      . cilostazol (PLETAL) 100 MG tablet Take 100 mg by mouth 2 (two) times daily.      . clopidogrel (PLAVIX) 75 MG tablet Take 75 mg by mouth daily with breakfast. Start taking 75 mg daily on 06/30/13; (to begin 5 days prior to procedure of 11/18)      . CRESTOR 10 MG tablet TAKE 1 TABLET BY MOUTH AT BEDTIME  30 tablet  5  . oxyCODONE-acetaminophen (PERCOCET/ROXICET) 5-325 MG per tablet Take 1-2 tablets by mouth every 4 (four) hours as needed for moderate pain.  10 tablet  0   No current facility-administered medications for this visit.     Allergies: No Known Allergies  Past Medical History, Surgical history, Social history, and Family History were reviewed and updated.  Review of Systems:  Remaining ROS negative.  Physical Exam: Blood pressure 156/65, pulse 79, temperature 98.1 F (36.7 C), temperature source Oral, resp. rate 18, height 5'  9" (1.753 m), weight 207 lb 9.6 oz (94.167 kg), SpO2 100.00%. ECOG: 1 General appearance: alert, cooperative and no distress Head: Normocephalic, without obvious abnormality, atraumatic Neck: no adenopathy, no carotid bruit, no JVD, supple. Lymph nodes: Cervical, supraclavicular, and axillary nodes normal. Heart:regular rate and rhythm, S1, S2 normal, no murmur, click, rub or gallop Lung:chest clear, no wheezing, rales, normal symmetric air entry, no tachypnea, retractions or cyanosis Abdomen: soft,  non-tender, without masses or organomegaly EXT:no erythema, induration, or nodules Neurological examination: No deficits noted on today's examination.  Lab Results: Lab Results  Component Value Date   WBC 7.1 10/21/2013   HGB 11.3* 10/21/2013   HCT 33.9* 10/21/2013   MCV 93.4 10/21/2013   PLT 212 10/21/2013     Chemistry      Component Value Date/Time   NA 145 08/23/2013 1141   NA 144 07/05/2013 0609   K 3.2* 08/23/2013 1141   K 3.7 07/05/2013 0609   CL 106 07/05/2013 0609   CL 106 02/01/2013 1120   CO2 31* 08/23/2013 1141   CO2 27 04/09/2012 0500   BUN 12.1 08/23/2013 1141   BUN 11 07/05/2013 0609   CREATININE 0.8 08/23/2013 1141   CREATININE 1.00 07/05/2013 0609      Component Value Date/Time   CALCIUM 8.8 08/23/2013 1141   CALCIUM 8.6 04/09/2012 0500   ALKPHOS 87 08/23/2013 1141   ALKPHOS 96 01/14/2013 0952   AST 20 08/23/2013 1141   AST 15 01/14/2013 0952   ALT 22 08/23/2013 1141   ALT 17 01/14/2013 0952   BILITOT 0.66 08/23/2013 1141   BILITOT 0.7 01/14/2013 0952       Results for AMMAR, MOFFATT (MRN [id number]) as of 10/21/2013 12:08  Ref. Range 05/19/2013 09:31 06/28/2013 08:57  PSA Latest Range: <=4.00 ng/mL 0.47 0.42     Impression and Plan:  This is a 68 year-old gentleman with the following issues:  1. Castration resistant prostate cancer. Currently on Zytiga and tolerating it well.  His last PSA was done in November 2014 and a was significantly low that I will be repeated today. He continues to tolerate this medication without any complications I anticipate to continue this medication unless his PSA started rising rapidly. 2. Androgen deprivation. He remains on Lupron under the care of Dr. Janice Norrie. Last given in June 2014. 3. Bone directed therapy. We will address this with him in the future. He has very minimal bone involvement, but we may consider this down the line. Continue calcium and vitamin D supplements. 4. Followup. In about 6 weeks.     [facility] 3/6/201512:06 PM

## 2013-10-24 ENCOUNTER — Telehealth: Payer: Self-pay | Admitting: *Deleted

## 2013-10-24 NOTE — Telephone Encounter (Signed)
Patient called requesting PSA results from 10-21-2013.  Results given.  Also reports having four pills left of zytiga.  Noted  zytiga refill documentation reads this was faxed 10-21-2013 at 5:00 pm to [facility].

## 2013-10-26 NOTE — Telephone Encounter (Signed)
[facility] sent facsimile confirmation of Zytiga prescription shipment.  Alec Harris was shipped on 10-25-2013 with next business day delivery.

## 2013-11-01 DIAGNOSIS — C61 Malignant neoplasm of prostate: Secondary | ICD-10-CM | POA: Diagnosis not present

## 2013-11-18 ENCOUNTER — Other Ambulatory Visit: Payer: Self-pay | Admitting: Medical Oncology

## 2013-11-18 ENCOUNTER — Other Ambulatory Visit: Payer: Self-pay | Admitting: *Deleted

## 2013-11-18 MED ORDER — ABIRATERONE ACETATE 250 MG PO TABS: 1000.0000 mg | ORAL_TABLET | Freq: Every day | ORAL | Status: DC

## 2013-11-18 NOTE — Telephone Encounter (Signed)
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

## 2013-11-18 NOTE — Telephone Encounter (Signed)
Prescription refill for Zytiga faxed to [facility].

## 2013-11-25 NOTE — Telephone Encounter (Signed)
RECEIVED A FAX FROM [facility] CONCERNING A CONFIRMATION OF PRESCRIPTION SHIPMENT FOR [place] ON 11/24/13.

## 2013-11-30 DIAGNOSIS — E785 Hyperlipidemia, unspecified: Secondary | ICD-10-CM | POA: Diagnosis not present

## 2013-11-30 DIAGNOSIS — I10 Essential (primary) hypertension: Secondary | ICD-10-CM | POA: Diagnosis not present

## 2013-12-02 ENCOUNTER — Telehealth: Payer: Self-pay | Admitting: Oncology

## 2013-12-02 ENCOUNTER — Ambulatory Visit (HOSPITAL_BASED_OUTPATIENT_CLINIC_OR_DEPARTMENT_OTHER): Payer: Medicare Other | Admitting: Physician Assistant

## 2013-12-02 ENCOUNTER — Encounter: Payer: Self-pay | Admitting: Physician Assistant

## 2013-12-02 ENCOUNTER — Other Ambulatory Visit (HOSPITAL_BASED_OUTPATIENT_CLINIC_OR_DEPARTMENT_OTHER): Payer: Medicare Other

## 2013-12-02 VITALS — BP 134/79 | HR 75 | Temp 98.2°F | Resp 19 | Ht 69.0 in | Wt 209.8 lb

## 2013-12-02 DIAGNOSIS — C7951 Secondary malignant neoplasm of bone: Secondary | ICD-10-CM

## 2013-12-02 DIAGNOSIS — C61 Malignant neoplasm of prostate: Secondary | ICD-10-CM

## 2013-12-02 DIAGNOSIS — C7952 Secondary malignant neoplasm of bone marrow: Secondary | ICD-10-CM

## 2013-12-02 LAB — CBC WITH DIFFERENTIAL/PLATELET
BASO%: 0.7 % (ref 0.0–2.0)
Basophils Absolute: 0.1 10*3/uL (ref 0.0–0.1)
EOS%: 2.2 % (ref 0.0–7.0)
Eosinophils Absolute: 0.2 10*3/uL (ref 0.0–0.5)
HCT: 35.9 % — ABNORMAL LOW (ref 38.4–49.9)
HGB: 11.7 g/dL — ABNORMAL LOW (ref 13.0–17.1)
LYMPH#: 3.8 10*3/uL — AB (ref 0.9–3.3)
LYMPH%: 48.8 % (ref 14.0–49.0)
MCH: 30.3 pg (ref 27.2–33.4)
MCHC: 32.6 g/dL (ref 32.0–36.0)
MCV: 92.9 fL (ref 79.3–98.0)
MONO#: 0.8 10*3/uL (ref 0.1–0.9)
MONO%: 10 % (ref 0.0–14.0)
NEUT#: 3 10*3/uL (ref 1.5–6.5)
NEUT%: 38.3 % — ABNORMAL LOW (ref 39.0–75.0)
Platelets: 207 10*3/uL (ref 140–400)
RBC: 3.86 10*6/uL — AB (ref 4.20–5.82)
RDW: 15.6 % — ABNORMAL HIGH (ref 11.0–14.6)
WBC: 7.7 10*3/uL (ref 4.0–10.3)

## 2013-12-02 LAB — COMPREHENSIVE METABOLIC PANEL (CC13)
ANION GAP: 7 meq/L (ref 3–11)
AST: 12 U/L (ref 5–34)
Albumin: 3.2 g/dL — ABNORMAL LOW (ref 3.5–5.0)
Alkaline Phosphatase: 92 U/L (ref 40–150)
BUN: 12.3 mg/dL (ref 7.0–26.0)
CALCIUM: 9.4 mg/dL (ref 8.4–10.4)
CHLORIDE: 111 meq/L — AB (ref 98–109)
CO2: 25 meq/L (ref 22–29)
CREATININE: 0.8 mg/dL (ref 0.7–1.3)
Glucose: 93 mg/dl (ref 70–140)
Potassium: 4.4 mEq/L (ref 3.5–5.1)
SODIUM: 143 meq/L (ref 136–145)
TOTAL PROTEIN: 6.8 g/dL (ref 6.4–8.3)
Total Bilirubin: 0.47 mg/dL (ref 0.20–1.20)

## 2013-12-02 NOTE — Telephone Encounter (Signed)
GAve pt appt for lab and MD for May 2015 °

## 2013-12-02 NOTE — Progress Notes (Signed)
Hematology and Oncology Follow Up Visit  Alec Harris [id number] 10-May-1946 68 y.o. 12/02/2013 3:50 PM [name],DAVIS L, MDCloward, [name], MD   Principle Diagnosis: 68 year old with Castration resistant prostate cancer. He was diagnosed in 2003 with Gleason score 4 + 4 = 8. Now he has metastatic disease with involvement of lymph nodes and bone.  Prior Therapy: He was initially diagnosed in 2003 with an elevated PSA. He underwent a prostatectomy with a pathology that showed prostatic adenocarcinoma. Gleason score 4+4 equals 8, with the tumor involving the margins and the pathologic staging was pT4 N1 with 1 of 2 left pelvic lymph nodes involved. He was started on adjuvant hormone therapy with Lupron. He developed a rising PSA and bone metastasis. He was then treated with ketoconazole and prednisone. He developed a rising PSA with a doubling time of just over 6 months. Bone scan in September 2013 showed increased uptake in the right lower lumbar spine at L4 and L5 and abnormal uptake in the mid sternum which had progressed since the prior bone scan.  Current therapy: Zytiga 1000 mg daily started in October 2013. He remains on Lupron at [facility] urology.  Interim History:  Alec Harris returns for a routine visit. He has been on Zytiga since 05/2012 and is tolerating this well. He reports some occasional night sweats usually in his mid chest area.He denied any chest pain, radiation, nausea or vomiting. He denied any bone pain. He voiced no other complaints.  He denies chest pain, shortness of breath or abdominal pain. Denied lower extremity edema or back pain. No neurological changes. Denies hematuria or other evidence of bleeding. Appetite and weight are stable. Alec Harris He is not reporting any new complications with Zytiga. He continues to adhere strictly to [facility] without any missed doses. He takes it in the morning with the appropriate instructions. Has not reported any recent hospitalizations or  illnesses.  Medications: I have reviewed the patient's current medications.  Current Outpatient Prescriptions  Medication Sig Dispense Refill  . abiraterone Acetate (ZYTIGA) 250 MG tablet Take 4 tablets (1,000 mg total) by mouth daily. Take on an empty stomach 1 hour before or 2 hours after a meal  120 tablet  0  . amLODipine (NORVASC) 5 MG tablet Take 5 mg by mouth daily.       Alec Harris aspirin EC 81 MG tablet Take 81 mg by mouth daily.      . Calcium Carb-Cholecalciferol (CALCIUM 500 +D) 500-400 MG-UNIT TABS Take 1 tablet by mouth 2 (two) times daily.      . cilostazol (PLETAL) 100 MG tablet Take 100 mg by mouth 2 (two) times daily.      . clopidogrel (PLAVIX) 75 MG tablet Take 75 mg by mouth daily with breakfast. Start taking 75 mg daily on 06/30/13; (to begin 5 days prior to procedure of 11/18)      . CRESTOR 10 MG tablet TAKE 1 TABLET BY MOUTH AT BEDTIME  30 tablet  5   No current facility-administered medications for this visit.     Allergies: No Known Allergies  Past Medical History, Surgical history, Social history, and Family History were reviewed and updated.  Review of Systems:  Remaining ROS negative.  Physical Exam: Blood pressure 134/79, pulse 75, temperature 98.2 F (36.8 C), temperature source Oral, resp. rate 19, height 5\' 9"  (1.753 m), weight 209 lb 12.8 oz (95.165 kg).  ECOG: 1  General appearance: alert, cooperative and no distress Head: Normocephalic, without obvious abnormality, atraumatic Neck:  no adenopathy, no carotid bruit, no JVD, supple. Lymph nodes: Cervical, supraclavicular, and axillary nodes normal. Heart:regular rate and rhythm, S1, S2 normal, no murmur, click, rub or gallop Lung:chest clear, no wheezing, rales, normal symmetric air entry, no tachypnea, retractions or cyanosis Abdomen: soft, non-tender, without masses or organomegaly EXT:no erythema, induration, or nodules Neurological examination: No deficits noted on today's examination.  Lab  Results: Lab Results  Component Value Date   WBC 7.7 12/02/2013   HGB 11.7* 12/02/2013   HCT 35.9* 12/02/2013   MCV 92.9 12/02/2013   PLT 207 12/02/2013     Chemistry      Component Value Date/Time   NA 143 12/02/2013 1147   NA 144 07/05/2013 0609   K 4.4 12/02/2013 1147   K 3.7 07/05/2013 0609   CL 106 07/05/2013 0609   CL 106 02/01/2013 1120   CO2 25 12/02/2013 1147   CO2 27 04/09/2012 0500   BUN 12.3 12/02/2013 1147   BUN 11 07/05/2013 0609   CREATININE 0.8 12/02/2013 1147   CREATININE 1.00 07/05/2013 0609      Component Value Date/Time   CALCIUM 9.4 12/02/2013 1147   CALCIUM 8.6 04/09/2012 0500   ALKPHOS 92 12/02/2013 1147   ALKPHOS 96 01/14/2013 0952   AST 12 12/02/2013 1147   AST 15 01/14/2013 0952   ALT <6 12/02/2013 1147   ALT 17 01/14/2013 0952   BILITOT 0.47 12/02/2013 1147   BILITOT 0.7 01/14/2013 0952       Results for PLES, TRUDEL (MRN [id number]) as of 10/21/2013 12:08  Ref. Range 05/19/2013 09:31 06/28/2013 08:57  PSA Latest Range: <=4.00 ng/mL 0.47 0.42   12/02/2013 PSA 0.47  Impression and Plan:  This is a 68 year-old gentleman with the following issues:  1. Castration resistant prostate cancer. Currently on Zytiga and tolerating it well. PSA level today 0.47.  He continues to tolerate this medication without any complications He will continue this medication unless his PSA started rising rapidly. 2. Androgen deprivation. He remains on Lupron under the care of Dr. Janice Norrie. Last given in June 2014. 3. Bone directed therapy. We will address this with him in the future. He has very minimal bone involvement, but we may consider this down the line. Continue calcium and vitamin D supplements. 4. Followup. In about 6 weeks.     [name] 4/17/20153:50 PM

## 2013-12-03 LAB — PSA: PSA: 0.47 ng/mL (ref <=4.00)

## 2013-12-05 ENCOUNTER — Telehealth: Payer: Self-pay | Admitting: Medical Oncology

## 2013-12-05 NOTE — Telephone Encounter (Signed)
Per MD, informed patient of his PSA results. Patient expressed thanks, denies questions at this time.

## 2013-12-05 NOTE — Telephone Encounter (Signed)
Message copied by Maxwell Marion on Mon Dec 05, 2013  9:56 AM ------      Message from: [name]      Created: Mon Dec 05, 2013  8:39 AM       Please call his PSA. Very little change and still very low. ------

## 2013-12-08 NOTE — Patient Instructions (Signed)
Continue Zytiga at the current dose Follow-up in 6 weeks 

## 2013-12-19 ENCOUNTER — Other Ambulatory Visit: Payer: Self-pay | Admitting: *Deleted

## 2013-12-19 NOTE — Telephone Encounter (Signed)
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

## 2013-12-20 ENCOUNTER — Other Ambulatory Visit: Payer: Self-pay | Admitting: Medical Oncology

## 2013-12-20 MED ORDER — ABIRATERONE ACETATE 250 MG PO TABS: 1000.0000 mg | ORAL_TABLET | Freq: Every day | ORAL | Status: DC

## 2013-12-20 NOTE — Telephone Encounter (Signed)
Prescription refill for Zytiga faxed to [facility] @ [phone] 

## 2013-12-22 NOTE — Telephone Encounter (Signed)
[facility] sent facsimile confirmation of Zytiga prescription shipment.  Zytiga was shipped on 12-21-2013 with next business day delivery.    

## 2013-12-29 ENCOUNTER — Other Ambulatory Visit (HOSPITAL_BASED_OUTPATIENT_CLINIC_OR_DEPARTMENT_OTHER): Payer: Medicare Other

## 2013-12-29 ENCOUNTER — Ambulatory Visit (HOSPITAL_BASED_OUTPATIENT_CLINIC_OR_DEPARTMENT_OTHER): Payer: Medicare Other | Admitting: Oncology

## 2013-12-29 ENCOUNTER — Encounter: Payer: Self-pay | Admitting: Oncology

## 2013-12-29 ENCOUNTER — Telehealth: Payer: Self-pay | Admitting: Oncology

## 2013-12-29 VITALS — BP 153/61 | HR 80 | Temp 98.1°F | Resp 18 | Ht 69.0 in | Wt 209.7 lb

## 2013-12-29 DIAGNOSIS — C61 Malignant neoplasm of prostate: Secondary | ICD-10-CM

## 2013-12-29 DIAGNOSIS — C7952 Secondary malignant neoplasm of bone marrow: Secondary | ICD-10-CM | POA: Diagnosis not present

## 2013-12-29 DIAGNOSIS — C779 Secondary and unspecified malignant neoplasm of lymph node, unspecified: Secondary | ICD-10-CM | POA: Diagnosis not present

## 2013-12-29 DIAGNOSIS — C7951 Secondary malignant neoplasm of bone: Secondary | ICD-10-CM | POA: Diagnosis not present

## 2013-12-29 LAB — CBC WITH DIFFERENTIAL/PLATELET
BASO%: 1 % (ref 0.0–2.0)
Basophils Absolute: 0.1 10*3/uL (ref 0.0–0.1)
EOS%: 1.9 % (ref 0.0–7.0)
Eosinophils Absolute: 0.2 10*3/uL (ref 0.0–0.5)
HCT: 35.8 % — ABNORMAL LOW (ref 38.4–49.9)
HGB: 11.7 g/dL — ABNORMAL LOW (ref 13.0–17.1)
LYMPH%: 43.6 % (ref 14.0–49.0)
MCH: 30.4 pg (ref 27.2–33.4)
MCHC: 32.6 g/dL (ref 32.0–36.0)
MCV: 93.2 fL (ref 79.3–98.0)
MONO#: 0.8 10*3/uL (ref 0.1–0.9)
MONO%: 10 % (ref 0.0–14.0)
NEUT%: 43.5 % (ref 39.0–75.0)
NEUTROS ABS: 3.4 10*3/uL (ref 1.5–6.5)
Platelets: 205 10*3/uL (ref 140–400)
RBC: 3.84 10*6/uL — AB (ref 4.20–5.82)
RDW: 15.7 % — ABNORMAL HIGH (ref 11.0–14.6)
WBC: 7.9 10*3/uL (ref 4.0–10.3)
lymph#: 3.4 10*3/uL — ABNORMAL HIGH (ref 0.9–3.3)

## 2013-12-29 LAB — COMPREHENSIVE METABOLIC PANEL (CC13)
ALBUMIN: 3.2 g/dL — AB (ref 3.5–5.0)
ALK PHOS: 82 U/L (ref 40–150)
ALT: 9 U/L (ref 0–55)
AST: 10 U/L (ref 5–34)
Anion Gap: 10 mEq/L (ref 3–11)
BILIRUBIN TOTAL: 0.54 mg/dL (ref 0.20–1.20)
BUN: 12.2 mg/dL (ref 7.0–26.0)
CO2: 25 mEq/L (ref 22–29)
Calcium: 9.3 mg/dL (ref 8.4–10.4)
Chloride: 108 mEq/L (ref 98–109)
Creatinine: 0.9 mg/dL (ref 0.7–1.3)
Glucose: 122 mg/dl (ref 70–140)
Potassium: 4.4 mEq/L (ref 3.5–5.1)
SODIUM: 142 meq/L (ref 136–145)
TOTAL PROTEIN: 6.7 g/dL (ref 6.4–8.3)

## 2013-12-29 NOTE — Progress Notes (Signed)
Hematology and Oncology Follow Up Visit  Alec Harris [id number] [birth year] 68 y.o. 12/29/2013 12:03 PM [name],DAVIS L, MDCloward, [name], MD   Principle Diagnosis: 68 year old with Castration resistant prostate cancer. He was diagnosed in 2003 with Gleason score 4 + 4 = 8. Now he has metastatic disease with involvement of lymph nodes and bone.  Prior Therapy: He was initially diagnosed in 2003 with an elevated PSA. He underwent a prostatectomy with a pathology that showed prostatic adenocarcinoma. Gleason score 4+4 equals 8, with the tumor involving the margins and the pathologic staging was pT4 N1 with 1 of 2 left pelvic lymph nodes involved. He was started on adjuvant hormone therapy with Lupron. He developed a rising PSA and bone metastasis. He was then treated with ketoconazole and prednisone. He developed a rising PSA with a doubling time of just over 6 months. Bone scan in September 2013 showed increased uptake in the right lower lumbar spine at L4 and L5 and abnormal uptake in the mid sternum which had progressed since the prior bone scan.  Current therapy:  Zytiga 1000 mg daily started in October 2013.  He remains on Lupron at [facility] urology.  Interim History: Alec Harris returns for a routine visit. He has been on Zytiga since 05/2012 and is tolerating this well. Since his last visit, he reports no new issues. He reports a slight decline in his appetite but no weight loss. He denied any chest pain, radiation, nausea or vomiting. He denied any bone pain.  He denies chest pain, shortness of breath or abdominal pain. Denied lower extremity edema or back pain. No neurological changes. Denies hematuria or other evidence of bleeding. He continues to adhere strictly to [facility] without any missed doses. He takes it in the morning with the appropriate instructions. Has not reported any recent hospitalizations or illnesses. He has not reported any neurological symptoms such as headaches blurred vision  or double vision. Has not reported any cough or hemoptysis or hematemesis. Does not report any hematochezia or melena. Does not report any frequency urgency or hesitancy. Does not report any rash or lesions. Does not report any petechiae. Rest of his review of systems was unremarkable.  Medications: I have reviewed the patient's current medications. No changes on my review today. Current Outpatient Prescriptions  Medication Sig Dispense Refill  . abiraterone Acetate (ZYTIGA) 250 MG tablet Take 4 tablets (1,000 mg total) by mouth daily. Take on an empty stomach 1 hour before or 2 hours after a meal  120 tablet  0  . amLODipine (NORVASC) 5 MG tablet Take 5 mg by mouth daily.       [name] aspirin EC 81 MG tablet Take 81 mg by mouth daily.      . Calcium Carb-Cholecalciferol (CALCIUM 500 +D) 500-400 MG-UNIT TABS Take 1 tablet by mouth 2 (two) times daily.      . cilostazol (PLETAL) 100 MG tablet Take 100 mg by mouth 2 (two) times daily.      . clopidogrel (PLAVIX) 75 MG tablet Take 75 mg by mouth daily with breakfast. Start taking 75 mg daily on 06/30/13; (to begin 5 days prior to procedure of 11/18)      . CRESTOR 10 MG tablet TAKE 1 TABLET BY MOUTH AT BEDTIME  30 tablet  5   No current facility-administered medications for this visit.     Allergies: No Known Allergies   Physical Exam: Blood pressure 153/61, pulse 80, temperature 98.1 F (36.7 C), temperature source Oral,  resp. rate 18, height 5\' 9"  (1.753 m), weight 209 lb 11.2 oz (95.119 kg), SpO2 100.00%.  ECOG: 1  General appearance: Alert awake without any distress per Head: Normocephalic, without obvious abnormality, atraumatic Neck: no adenopathy, no thyroid masses. Lymph nodes: Cervical, supraclavicular, and axillary nodes normal. Heart:regular rate and rhythm, S1, S2 normal, no murmur, click, rub or gallop Lung:chest clear, no wheezing, rales, normal symmetric air entry, no tachypnea, retractions or cyanosis Abdomen: soft,  non-tender, without masses or organomegaly EXT:no erythema, induration, or nodules Neurological examination: No deficits noted.  Lab Results: Lab Results  Component Value Date   WBC 7.9 12/29/2013   HGB 11.7* 12/29/2013   HCT 35.8* 12/29/2013   MCV 93.2 12/29/2013   PLT 205 12/29/2013     Chemistry      Component Value Date/Time   NA 143 12/02/2013 1147   NA 144 07/05/2013 0609   K 4.4 12/02/2013 1147   K 3.7 07/05/2013 0609   CL 106 07/05/2013 0609   CL 106 02/01/2013 1120   CO2 25 12/02/2013 1147   CO2 27 04/09/2012 0500   BUN 12.3 12/02/2013 1147   BUN 11 07/05/2013 0609   CREATININE 0.8 12/02/2013 1147   CREATININE 1.00 07/05/2013 0609      Component Value Date/Time   CALCIUM 9.4 12/02/2013 1147   CALCIUM 8.6 04/09/2012 0500   ALKPHOS 92 12/02/2013 1147   ALKPHOS 96 01/14/2013 0952   AST 12 12/02/2013 1147   AST 15 01/14/2013 0952   ALT <6 12/02/2013 1147   ALT 17 01/14/2013 0952   BILITOT 0.47 12/02/2013 1147   BILITOT 0.7 01/14/2013 0952     Results for Alec Harris (MRN [id number]) as of 12/29/2013 11:53  Ref. Range 06/28/2013 08:57 10/21/2013 11:36 12/02/2013 11:47  PSA Latest Range: <=4.00 ng/mL 0.42 0.40 0.47    Impression and Plan:  This is a 68 year-old gentleman with the following issues:  1. Castration resistant prostate cancer. Currently on Zytiga and tolerating it well. His last PSA was 0.47 and continues to be stable.  He continues to tolerate this medication without any complications He will continue this medication unless his PSA started rising rapidly. 2. Androgen deprivation. He remains on Lupron under the care of Dr. Janice Norrie. Last given in June 2014. 3. Bone directed therapy. He has very minimal bone involvement, but we may consider this down the line. Continue calcium and vitamin D supplements. 4. Followup. In about 6 weeks.     [name] 5/14/201512:03 PM

## 2013-12-29 NOTE — Telephone Encounter (Signed)
gv adn prnted aptp sched and avs for pt for June

## 2013-12-30 LAB — PSA: PSA: 0.55 ng/mL (ref <=4.00)

## 2014-01-18 ENCOUNTER — Other Ambulatory Visit: Payer: Self-pay | Admitting: Medical Oncology

## 2014-01-18 MED ORDER — ABIRATERONE ACETATE 250 MG PO TABS: 1000.0000 mg | ORAL_TABLET | Freq: Every day | ORAL | Status: DC

## 2014-01-18 NOTE — Telephone Encounter (Signed)
Zytiga prescription refill faxed to [facility] @ [phone] 

## 2014-01-24 NOTE — Telephone Encounter (Signed)
RECEIVED A FAX FROM [facility] CONCERNING A CONFIRMATION OF PRESCRIPTION SHIPMENT FOR [place] ON 01/23/14.

## 2014-01-30 ENCOUNTER — Other Ambulatory Visit: Payer: Self-pay | Admitting: Surgery

## 2014-02-09 ENCOUNTER — Other Ambulatory Visit: Payer: Self-pay | Admitting: *Deleted

## 2014-02-09 ENCOUNTER — Telehealth: Payer: Self-pay | Admitting: Oncology

## 2014-02-09 ENCOUNTER — Ambulatory Visit: Payer: Medicare Other | Admitting: Oncology

## 2014-02-09 ENCOUNTER — Other Ambulatory Visit: Payer: Medicare Other

## 2014-02-09 ENCOUNTER — Other Ambulatory Visit (HOSPITAL_BASED_OUTPATIENT_CLINIC_OR_DEPARTMENT_OTHER): Payer: Medicare Other

## 2014-02-09 ENCOUNTER — Ambulatory Visit (HOSPITAL_BASED_OUTPATIENT_CLINIC_OR_DEPARTMENT_OTHER): Payer: Medicare Other | Admitting: Family

## 2014-02-09 ENCOUNTER — Ambulatory Visit: Payer: Medicare Other | Admitting: Physician Assistant

## 2014-02-09 VITALS — BP 147/61 | HR 81 | Temp 98.6°F | Resp 18 | Ht 69.0 in | Wt 208.9 lb

## 2014-02-09 DIAGNOSIS — C7952 Secondary malignant neoplasm of bone marrow: Secondary | ICD-10-CM | POA: Diagnosis not present

## 2014-02-09 DIAGNOSIS — E291 Testicular hypofunction: Secondary | ICD-10-CM

## 2014-02-09 DIAGNOSIS — C7951 Secondary malignant neoplasm of bone: Secondary | ICD-10-CM

## 2014-02-09 DIAGNOSIS — R197 Diarrhea, unspecified: Secondary | ICD-10-CM

## 2014-02-09 DIAGNOSIS — C61 Malignant neoplasm of prostate: Secondary | ICD-10-CM

## 2014-02-09 LAB — COMPREHENSIVE METABOLIC PANEL (CC13)
ALBUMIN: 3.3 g/dL — AB (ref 3.5–5.0)
ALK PHOS: 84 U/L (ref 40–150)
ALT: 9 U/L (ref 0–55)
AST: 11 U/L (ref 5–34)
Anion Gap: 9 mEq/L (ref 3–11)
BILIRUBIN TOTAL: 0.57 mg/dL (ref 0.20–1.20)
BUN: 12.2 mg/dL (ref 7.0–26.0)
CO2: 24 mEq/L (ref 22–29)
Calcium: 9.4 mg/dL (ref 8.4–10.4)
Chloride: 107 mEq/L (ref 98–109)
Creatinine: 0.9 mg/dL (ref 0.7–1.3)
Glucose: 108 mg/dl (ref 70–140)
POTASSIUM: 4.3 meq/L (ref 3.5–5.1)
SODIUM: 140 meq/L (ref 136–145)
TOTAL PROTEIN: 7 g/dL (ref 6.4–8.3)

## 2014-02-09 LAB — CBC WITH DIFFERENTIAL/PLATELET
BASO%: 0.9 % (ref 0.0–2.0)
BASOS ABS: 0.1 10*3/uL (ref 0.0–0.1)
EOS%: 2 % (ref 0.0–7.0)
Eosinophils Absolute: 0.2 10*3/uL (ref 0.0–0.5)
HEMATOCRIT: 35.7 % — AB (ref 38.4–49.9)
HEMOGLOBIN: 11.8 g/dL — AB (ref 13.0–17.1)
LYMPH%: 46.2 % (ref 14.0–49.0)
MCH: 30.9 pg (ref 27.2–33.4)
MCHC: 33 g/dL (ref 32.0–36.0)
MCV: 93.6 fL (ref 79.3–98.0)
MONO#: 0.8 10*3/uL (ref 0.1–0.9)
MONO%: 9.7 % (ref 0.0–14.0)
NEUT#: 3.3 10*3/uL (ref 1.5–6.5)
NEUT%: 41.2 % (ref 39.0–75.0)
PLATELETS: 217 10*3/uL (ref 140–400)
RBC: 3.81 10*6/uL — ABNORMAL LOW (ref 4.20–5.82)
RDW: 16.1 % — ABNORMAL HIGH (ref 11.0–14.6)
WBC: 8 10*3/uL (ref 4.0–10.3)
lymph#: 3.7 10*3/uL — ABNORMAL HIGH (ref 0.9–3.3)

## 2014-02-09 MED ORDER — ABIRATERONE ACETATE 250 MG PO TABS: 1000.0000 mg | ORAL_TABLET | Freq: Every day | ORAL | Status: DC

## 2014-02-09 NOTE — Progress Notes (Signed)
[place]  [phone] [phone]  ID: Alec Harris OB: Oct 29, 1945 MR#: [id number] TOI#:[id number] PCP: Thurman Coyer, MD GYN:  SU:  OTHER MD:  CHIEF COMPLAINT: 68 year old with Castration resistant prostate cancer. He was diagnosed in 2003 with Gleason score 4 + 4 = 8. Now he has metastatic disease with involvement of lymph nodes and bone.   Prior Therapy: He was initially diagnosed in 2003 with an elevated PSA. He underwent a prostatectomy with a pathology that showed prostatic adenocarcinoma. Gleason score 4+4 equals 8, with Alec tumor involving Alec margins and Alec pathologic staging was pT4 N1 with 1 of 2 left pelvic lymph nodes involved. He was started on adjuvant hormone therapy with Lupron. He developed a rising PSA and bone metastasis. He was then treated with ketoconazole and prednisone. He developed a rising PSA with a doubling time of just over 6 months. Bone scan in September 2013 showed increased uptake in Alec right lower lumbar spine at L4 and L5 and abnormal uptake in Alec mid sternum which had progressed since Alec prior bone scan.   Current therapy: Zytiga 1000 mg daily started in October 2013. He remains on Lupron at [facility] urology.   Interim History: Alec Harris returns for a 6 week follow-up visit for prostate cancer. He has been on Zytiga since 05/2012 and is tolerating this well. Since his last visit, he reports no new issues. He denies any chest pain, radiation, palpitations, nausea or vomiting. He denied having any aches or pains.[name] He shortness of breath or abdominal pain. Denied lower extremity edema or back pain. No neurological changes. He denies problems urinating or hematuria. He states that if he strains to have a bowel movement that sometimes there is a small amount of blood with his stool. He also states that he has diarrhea sometimes after taking his Pletal but it is relieved by taking imodium. He states that he has gas pain in his left  side about once a month that lasts for 10-20 seconds. He states that his appetite has been "good". He states that his energy level is "ok" and that his wife keeps him busy doing tasks around their house. He continues to adhere strictly to [facility] without any missed doses. He takes it in Alec morning with Alec appropriate instructions. Has not reported any recent hospitalizations or illnesses. He has not reported any neurological symptoms such as headaches, blurred or double vision. Has not reported any cough or hemoptysis or hematemesis. Does not report any hematochezia or melena. Does not report any frequency urgency or hesitancy. Does not report any rash or lesions. Does not report any petechiae.  REVIEW OF SYSTEMS: All other 10 point review of systems negative except for those issues mentioned above.  PAST MEDICAL HISTORY: Past Medical History  Diagnosis Date  . Prostate cancer   . Rectal bleeding 07/24/11    Diverticular Bleeding  . PAD (peripheral artery disease) 01/2012    severe diffuse his iliacs,SFA, carotids  . Carotid artery disease     By doppler  . Hyperlipemia   . Claudication 02/06/2012    ABI of 0.5 on both legs ,Rgt ext. iliac70 to 99%,rgt SFA occlude,lft ext.iliac common fem 70 to 99% ,Lft SFA occluded,mono flow popliteal    PAST SURGICAL HISTORY: Past Surgical History  Procedure Laterality Date  . Knee cartilage surgery      surgery for a torn ligament  . Cervical spine surgery  approx. 10 years ago    disc  removed from neck  . Colonoscopy  07/25/2011    Procedure: COLONOSCOPY;  Surgeon: Beryle Beams;  Location: [facility];  Service: Endoscopy;  Laterality: N/A;  . Prostate surgery  2003    for  prostate cancer  . Penile prosthesis implant    . Endarterectomy  04/08/2012    Procedure: ENDARTERECTOMY CAROTID;  Surgeon: Serafina Mitchell, MD;  Location: [facility] OR;  Service: Vascular;  Laterality: Right;  Right carotid endarterectomy with vascuguard 1cm x 6cm bovine patch  angioplasty.   . Carotid angiogram  03/02/2012    bilateral carotid artery stenosis,95% right and 80% left with a type 2 arch  . Pv angiogram  03/02/2012    total SFAs bilaterally with 3- vessel runoff below Alec knee, high-grade calcified iliac diseaese  . Doppler echocardiography  03/24/2012    EF >70% LV normal  . Lexiscan myoview  01/14/2012    small fixed basal to mid inferior bowel attenuation artifact. no reversible ischemia  . Carotid endarterectomy Right 04-08-12    CEA    FAMILY HISTORY Family History  Problem Relation Age of Onset  . Hypertension Mother   . Heart attack Mother   . Heart attack Father   . Heart disease Father     Irregular Heart beat, Heart Disease before age 61  . Cancer Brother     throat   SOCIAL HISTORY:  History   Social History  . Marital Status: Married    Spouse Name: N/A    Number of Children: N/A  . Years of Education: N/A   Occupational History  . Not on file.   Social History Main Topics  . Smoking status: Current Some Day Smoker -- 50 years    Types: Cigarettes  . Smokeless tobacco: Never Used     Comment: pt states that he is going to quit completely; 1/2 pack per day  . Alcohol Use: 3.0 oz/week    3 Cans of beer, 2 Shots of liquor per week  . Drug Use: No  . Sexual Activity: No     Comment: smokes 5 cigarettes per day....   Other Topics Concern  . Not on file   Social History Narrative  . No narrative on file   ADVANCED DIRECTIVES: N   HEALTH MAINTENANCE: History  Substance Use Topics  . Smoking status: Current Some Day Smoker -- 50 years    Types: Cigarettes  . Smokeless tobacco: Never Used     Comment: pt states that he is going to quit completely; 1/2 pack per day  . Alcohol Use: 3.0 oz/week    3 Cans of beer, 2 Shots of liquor per week   No Known Allergies  Current Outpatient Prescriptions  Medication Sig Dispense Refill  . abiraterone Acetate (ZYTIGA) 250 MG tablet Take 4 tablets (1,000 mg total) by mouth  daily. Take on an empty stomach 1 hour before or 2 hours after a meal  120 tablet  0  . amLODipine (NORVASC) 5 MG tablet Take 5 mg by mouth daily.       [name] aspirin EC 81 MG tablet Take 81 mg by mouth daily.      . Calcium Carb-Cholecalciferol (CALCIUM 500 +D) 500-400 MG-UNIT TABS Take 1 tablet by mouth 2 (two) times daily.      . cilostazol (PLETAL) 100 MG tablet Take 100 mg by mouth 2 (two) times daily.      . clopidogrel (PLAVIX) 75 MG tablet Take 75 mg by mouth daily with breakfast.  Start taking 75 mg daily on 06/30/13; (to begin 5 days prior to procedure of 11/18)      . clopidogrel (PLAVIX) 75 MG tablet START TAKING 75 MG DAILY ON 06/30/13 (TO BEGIN 5 DAYS PRIOR TO PROCEDURE OF 11/18)  30 tablet  6  . CRESTOR 10 MG tablet TAKE 1 TABLET BY MOUTH AT BEDTIME  30 tablet  5   No current facility-administered medications for this visit.    OBJECTIVE: Filed Vitals:   02/09/14 1331  BP: 147/61  Pulse: 81  Temp: 98.6 F (37 C)  Resp: 18   Body mass index is 30.83 kg/(m^2). ECOG FS:0 - Asymptomatic Ocular: Sclerae unicteric, pupils equal, round and reactive to light Ear-nose-throat: Oropharynx clear, dentition fair Lymphatic: No cervical or supraclavicular adenopathy Lungs no rales or rhonchi, good excursion bilaterally Heart regular rate and rhythm, no murmur appreciated Abd soft, nontender, positive bowel sounds MSK no focal spinal tenderness, no joint edema Neuro: non-focal, well-oriented, appropriate affect   LAB RESULTS: CMP     Component Value Date/Time   NA 142 12/29/2013 1137   NA 144 07/05/2013 0609   K 4.4 12/29/2013 1137   K 3.7 07/05/2013 0609   CL 106 07/05/2013 0609   CL 106 02/01/2013 1120   CO2 25 12/29/2013 1137   CO2 27 04/09/2012 0500   GLUCOSE 122 12/29/2013 1137   GLUCOSE 123* 07/05/2013 0609   GLUCOSE 130* 02/01/2013 1120   BUN 12.2 12/29/2013 1137   BUN 11 07/05/2013 0609   CREATININE 0.9 12/29/2013 1137   CREATININE 1.00 07/05/2013 0609   CALCIUM 9.3  12/29/2013 1137   CALCIUM 8.6 04/09/2012 0500   PROT 6.7 12/29/2013 1137   PROT 6.6 01/14/2013 0952   ALBUMIN 3.2* 12/29/2013 1137   ALBUMIN 3.7 01/14/2013 0952   AST 10 12/29/2013 1137   AST 15 01/14/2013 0952   ALT 9 12/29/2013 1137   ALT 17 01/14/2013 0952   ALKPHOS 82 12/29/2013 1137   ALKPHOS 96 01/14/2013 0952   BILITOT 0.54 12/29/2013 1137   BILITOT 0.7 01/14/2013 0952   GFRNONAA 84* 04/09/2012 0500   GFRAA >90 04/09/2012 0500   No results found for this basename: SPEP, UPEP,  kappa and lambda light chains   Lab Results  Component Value Date   WBC 8.0 02/09/2014   NEUTROABS 3.3 02/09/2014   HGB 11.8* 02/09/2014   HCT 35.7* 02/09/2014   MCV 93.6 02/09/2014   PLT 217 02/09/2014   No results found for this basename: LABCA2   No components found with this basename: [id number]   No results found for this basename: INR,  in Alec last 168 hours  STUDIES: No results found.  ASSESSMENT/PLAN: Alec Harris is a pleasant 68 y.o. male with Alec following issues:   1. Castration resistant prostate cancer. Currently on Zytiga and tolerating it well. His PSA on 12/29/13 was 0.55 and continues to be stable. He continues to tolerate this medication without any complications He will continue this medication unless his PSA started rising rapidly.   2. Androgen deprivation. He remains on Lupron under Alec care of Dr. Janice Norrie. Last given in June 2014.   3. Bone directed therapy. He has very minimal bone involvement, but we may consider this down Alec line. Continue calcium and vitamin D supplements.   4. Diarrhea with Pletal. Continue taking imodium as needed.   5. Constipation: Miralax 1 cap full in 8oz water or juice daily. Hold for diarrhea.  6. CBC and CMP were normal and  were discussed in detail. PSA is still pending.   4. Follow-up in 6 weeks with Dr. [name] Blew and labs.  Plan discussed with Alec Harris and he is in agreement. All questions answered. Harris knows to call cancer center with any questions or  issues. We can certainly see him before his next scheduled visit if needed.   Eliezer Bottom, NP 02/09/2014 2:15 PM

## 2014-02-09 NOTE — Telephone Encounter (Signed)
gv adn printed appt sched anda vs for pt for Aug °

## 2014-02-10 LAB — PSA: PSA: 0.6 ng/mL (ref <=4.00)

## 2014-02-10 NOTE — Telephone Encounter (Signed)
RECEIVED A FAX FROM [facility] CONCERNING A CONFIRMATION OF FACSIMILE RECEIPT FOR PT. REFERRAL. 

## 2014-02-20 ENCOUNTER — Other Ambulatory Visit (HOSPITAL_COMMUNITY): Payer: Medicare Other

## 2014-02-20 ENCOUNTER — Ambulatory Visit: Payer: Medicare Other | Admitting: Family

## 2014-02-23 NOTE — Telephone Encounter (Signed)
RECEIVED A FAX FROM [facility] CONCERNING A CONFIRMATION OF PRESCRIPTION SHIPMENT FOR [place] ON 02/22/14.

## 2014-03-03 ENCOUNTER — Encounter: Payer: Self-pay | Admitting: Family

## 2014-03-06 ENCOUNTER — Ambulatory Visit: Payer: Medicare Other | Admitting: Family

## 2014-03-06 ENCOUNTER — Ambulatory Visit (HOSPITAL_COMMUNITY)
Admission: RE | Admit: 2014-03-06 | Discharge: 2014-03-06 | Disposition: A | Discharge status: Home / Self Care | Payer: Medicare Other | Source: Ambulatory Visit | Attending: Family | Admitting: Family

## 2014-03-06 DIAGNOSIS — Z48812 Encounter for surgical aftercare following surgery on the circulatory system: Secondary | ICD-10-CM | POA: Diagnosis not present

## 2014-03-06 DIAGNOSIS — I658 Occlusion and stenosis of other precerebral arteries: Secondary | ICD-10-CM | POA: Insufficient documentation

## 2014-03-06 DIAGNOSIS — C61 Malignant neoplasm of prostate: Secondary | ICD-10-CM | POA: Diagnosis not present

## 2014-03-06 DIAGNOSIS — I6529 Occlusion and stenosis of unspecified carotid artery: Secondary | ICD-10-CM | POA: Insufficient documentation

## 2014-03-06 NOTE — Patient Instructions (Signed)
Dear Alec Harris,  Your recent vascular lab visit  03-06-14, indicates: NO significant change. Please revisit the lab in 6 month for  A carotid duplex.          Smoking Cessation, Tips for Success If you are ready to quit smoking, congratulations! You have chosen to help yourself be healthier. Cigarettes bring nicotine, tar, carbon monoxide, and other irritants into your body. Your lungs, heart, and blood vessels will be able to work better without these poisons. There are many different ways to quit smoking. Nicotine gum, nicotine patches, a nicotine inhaler, or nicotine nasal spray can help with physical craving. Hypnosis, support groups, and medicines help break the habit of smoking. WHAT THINGS CAN I DO TO MAKE QUITTING EASIER?  Here are some tips to help you quit for good:  Pick a date when you will quit smoking completely. Tell all of your friends and family about your plan to quit on that date.  Do not try to slowly cut down on the number of cigarettes you are smoking. Pick a quit date and quit smoking completely starting on that day.  Throw away all cigarettes.   Clean and remove all ashtrays from your home, work, and car.   On a card, write down your reasons for quitting. Carry the card with you and read it when you get the urge to smoke.   Cleanse your body of nicotine. Drink enough water and fluids to keep your urine clear or pale yellow. Do this after quitting to flush the nicotine from your body.   Learn to predict your moods. Do not let a bad situation be your excuse to have a cigarette. Some situations in your life might tempt you into wanting a cigarette.   Never have "just one" cigarette. It leads to wanting another and another. Remind yourself of your decision to quit.   Change habits associated with smoking. If you smoked while driving or when feeling stressed, try other activities to replace smoking. Stand up when drinking your coffee. Brush your teeth after  eating. Sit in a different chair when you read the paper. Avoid alcohol while trying to quit, and try to drink fewer caffeinated beverages. Alcohol and caffeine may urge you to smoke.   Avoid foods and drinks that can trigger a desire to smoke, such as sugary or spicy foods and alcohol.   Ask people who smoke not to smoke around you.   Have something planned to do right after eating or having a cup of coffee. For example, plan to take a walk or exercise.   Try a relaxation exercise to calm you down and decrease your stress. Remember, you may be tense and nervous for the first 2 weeks after you quit, but this will pass.   Find new activities to keep your hands busy. Play with a pen, coin, or rubber band. Doodle or draw things on paper.   Brush your teeth right after eating. This will help cut down on the craving for the taste of tobacco after meals. You can also try mouthwash.   Use oral substitutes in place of cigarettes. Try using lemon drops, carrots, cinnamon sticks, or chewing gum. Keep them handy so they are available when you have the urge to smoke.   When you have the urge to smoke, try deep breathing.   Designate your home as a nonsmoking area.   If you are a heavy smoker, ask your health care provider about a prescription for nicotine chewing  gum. It can ease your withdrawal from nicotine.   Reward yourself. Set aside the cigarette money you save and buy yourself something nice.   Look for support from others. Join a support group or smoking cessation program. Ask someone at home or at work to help you with your plan to quit smoking.   Always ask yourself, "Do I need this cigarette or is this just a reflex?" Tell yourself, "Today, I choose not to smoke," or "I do not want to smoke." You are reminding yourself of your decision to quit.  Do not replace cigarette smoking with electronic cigarettes (commonly called e-cigarettes). The safety of e-cigarettes is unknown,  and some may contain harmful chemicals.  If you relapse, do not give up! Plan ahead and think about what you will do the next time you get the urge to smoke.  HOW WILL I FEEL WHEN I QUIT SMOKING? You may have symptoms of withdrawal because your body is used to nicotine (the addictive substance in cigarettes). You may crave cigarettes, be irritable, feel very hungry, cough often, get headaches, or have difficulty concentrating. The withdrawal symptoms are only temporary. They are strongest when you first quit but will go away within 10-14 days. When withdrawal symptoms occur, stay in control. Think about your reasons for quitting. Remind yourself that these are signs that your body is healing and getting used to being without cigarettes. Remember that withdrawal symptoms are easier to treat than the major diseases that smoking can cause.  Even after the withdrawal is over, expect periodic urges to smoke. However, these cravings are generally short lived and will go away whether you smoke or not. Do not smoke!  WHAT RESOURCES ARE AVAILABLE TO HELP ME QUIT SMOKING? Your health care provider can direct you to community resources or hospitals for support, which may include:  Group support.  Education.  Hypnosis.  Therapy. Document Released: 05/02/2004 Document Revised: 05/25/2013 Document Reviewed: 01/20/2013 [facility] Patient Information 2015 [place], [place]. This information is not intended to replace advice given to you by your health care provider. Make sure you discuss any questions you have with your health care provider.

## 2014-03-07 ENCOUNTER — Encounter: Payer: Self-pay | Admitting: Vascular Surgery

## 2014-03-07 NOTE — Progress Notes (Signed)
Left before being seen.

## 2014-03-21 ENCOUNTER — Other Ambulatory Visit: Payer: Self-pay | Admitting: *Deleted

## 2014-03-21 MED ORDER — ABIRATERONE ACETATE 250 MG PO TABS: 1000.0000 mg | ORAL_TABLET | Freq: Every day | ORAL | Status: DC

## 2014-03-22 ENCOUNTER — Other Ambulatory Visit: Payer: Self-pay | Admitting: Internal Medicine

## 2014-03-22 NOTE — Telephone Encounter (Signed)
Rx was sent to pharmacy electronically. 

## 2014-03-23 ENCOUNTER — Encounter: Payer: Self-pay | Admitting: Oncology

## 2014-03-23 ENCOUNTER — Telehealth: Payer: Self-pay | Admitting: Oncology

## 2014-03-23 ENCOUNTER — Ambulatory Visit (HOSPITAL_BASED_OUTPATIENT_CLINIC_OR_DEPARTMENT_OTHER): Payer: Medicare Other | Admitting: Oncology

## 2014-03-23 ENCOUNTER — Other Ambulatory Visit (HOSPITAL_BASED_OUTPATIENT_CLINIC_OR_DEPARTMENT_OTHER): Payer: Medicare Other

## 2014-03-23 VITALS — BP 146/63 | HR 86 | Temp 98.3°F | Resp 18 | Ht 69.0 in | Wt 210.6 lb

## 2014-03-23 DIAGNOSIS — C7952 Secondary malignant neoplasm of bone marrow: Secondary | ICD-10-CM

## 2014-03-23 DIAGNOSIS — C7951 Secondary malignant neoplasm of bone: Secondary | ICD-10-CM | POA: Diagnosis not present

## 2014-03-23 DIAGNOSIS — C779 Secondary and unspecified malignant neoplasm of lymph node, unspecified: Secondary | ICD-10-CM | POA: Diagnosis not present

## 2014-03-23 DIAGNOSIS — C61 Malignant neoplasm of prostate: Secondary | ICD-10-CM

## 2014-03-23 LAB — COMPREHENSIVE METABOLIC PANEL (CC13)
ALT: 6 U/L (ref 0–55)
AST: 11 U/L (ref 5–34)
Albumin: 3.2 g/dL — ABNORMAL LOW (ref 3.5–5.0)
Alkaline Phosphatase: 77 U/L (ref 40–150)
Anion Gap: 8 mEq/L (ref 3–11)
BUN: 14.6 mg/dL (ref 7.0–26.0)
CALCIUM: 9 mg/dL (ref 8.4–10.4)
CHLORIDE: 109 meq/L (ref 98–109)
CO2: 26 mEq/L (ref 22–29)
Creatinine: 1 mg/dL (ref 0.7–1.3)
Glucose: 125 mg/dl (ref 70–140)
POTASSIUM: 4.2 meq/L (ref 3.5–5.1)
Sodium: 142 mEq/L (ref 136–145)
Total Bilirubin: 0.57 mg/dL (ref 0.20–1.20)
Total Protein: 6.8 g/dL (ref 6.4–8.3)

## 2014-03-23 LAB — CBC WITH DIFFERENTIAL/PLATELET
BASO%: 0.7 % (ref 0.0–2.0)
BASOS ABS: 0.1 10*3/uL (ref 0.0–0.1)
EOS ABS: 0.2 10*3/uL (ref 0.0–0.5)
EOS%: 2.6 % (ref 0.0–7.0)
HCT: 33.8 % — ABNORMAL LOW (ref 38.4–49.9)
HGB: 11 g/dL — ABNORMAL LOW (ref 13.0–17.1)
LYMPH#: 3.2 10*3/uL (ref 0.9–3.3)
LYMPH%: 43.2 % (ref 14.0–49.0)
MCH: 30 pg (ref 27.2–33.4)
MCHC: 32.4 g/dL (ref 32.0–36.0)
MCV: 92.6 fL (ref 79.3–98.0)
MONO#: 0.7 10*3/uL (ref 0.1–0.9)
MONO%: 10 % (ref 0.0–14.0)
NEUT#: 3.2 10*3/uL (ref 1.5–6.5)
NEUT%: 43.5 % (ref 39.0–75.0)
Platelets: 238 10*3/uL (ref 140–400)
RBC: 3.65 10*6/uL — ABNORMAL LOW (ref 4.20–5.82)
RDW: 15 % — AB (ref 11.0–14.6)
WBC: 7.4 10*3/uL (ref 4.0–10.3)

## 2014-03-23 NOTE — Telephone Encounter (Signed)
Pt confirmed labs/ov per 08/06 POF, gave pt AVS....KJ °

## 2014-03-23 NOTE — Progress Notes (Signed)
Hematology and Oncology Follow Up Visit  Alec Harris [id number] [birth year] 68 y.o. 03/23/2014 10:44 AM [name],DAVIS L, MDCloward, [name], MD   Principle Diagnosis: 68 year old with Castration resistant prostate cancer. He was diagnosed in 2003 with Gleason score 4 + 4 = 8. Now he has metastatic disease with involvement of lymph nodes and bone.  Prior Therapy: He was initially diagnosed in 2003 with an elevated PSA. He underwent a prostatectomy with a pathology that showed prostatic adenocarcinoma. Gleason score 4+4 equals 8, with the tumor involving the margins and the pathologic staging was pT4 N1 with 1 of 2 left pelvic lymph nodes involved. He was started on adjuvant hormone therapy with Lupron. He developed a rising PSA and bone metastasis. He was then treated with ketoconazole and prednisone. He developed a rising PSA with a doubling time of just over 6 months. Bone scan in September 2013 showed increased uptake in the right lower lumbar spine at L4 and L5 and abnormal uptake in the mid sternum which had progressed since the prior bone scan.  Current therapy:  Zytiga 1000 mg daily started in October 2013.  He remains on Lupron at [facility] urology.  Interim History: Alec Harris returns for a routine visit by himself. Since his last visit, he has done well without any complications. He has been on Zytiga since 05/2012 and is tolerating this well. He denied any chest pain, radiation, nausea or vomiting. He denied any bone pain.  He denies chest pain, shortness of breath or abdominal pain. Denied lower extremity edema or back pain. No neurological changes. Denies hematuria or other evidence of bleeding. He continues to adhere strictly to [facility] without any missed doses. He takes it in the morning with the appropriate instructions. Has not reported any recent hospitalizations or illnesses. He has not reported any neurological symptoms such as headaches blurred vision or double vision. Has not reported  any cough or hemoptysis or hematemesis. Does not report any hematochezia or melena. Does not report any frequency urgency or hesitancy. Does not report any rash or lesions. Does not report any petechiae. Rest of his review of systems was unremarkable.  Medications: I have reviewed the patient's current medications. No changes on my review today. Current Outpatient Prescriptions  Medication Sig Dispense Refill  . abiraterone Acetate (ZYTIGA) 250 MG tablet Take 4 tablets (1,000 mg total) by mouth daily. Take on an empty stomach 1 hour before or 2 hours after a meal  120 tablet  0  . amLODipine (NORVASC) 5 MG tablet Take 5 mg by mouth daily.       [name] aspirin EC 81 MG tablet Take 81 mg by mouth daily.      . Calcium Carb-Cholecalciferol (CALCIUM 500 +D) 500-400 MG-UNIT TABS Take 1 tablet by mouth 2 (two) times daily.      . cilostazol (PLETAL) 100 MG tablet Take 1 tablet (100 mg total) by mouth 2 (two) times daily. PATIENT NEEDS AN APPOINTMENT FOR FUTURE REFILLS.  60 tablet  0  . clopidogrel (PLAVIX) 75 MG tablet Take 75 mg by mouth daily with breakfast. Start taking 75 mg daily on 06/30/13; (to begin 5 days prior to procedure of 11/18)      . clopidogrel (PLAVIX) 75 MG tablet START TAKING 75 MG DAILY ON 06/30/13 (TO BEGIN 5 DAYS PRIOR TO PROCEDURE OF 11/18)  30 tablet  6  . CRESTOR 10 MG tablet TAKE 1 TABLET BY MOUTH AT BEDTIME  30 tablet  5   No  current facility-administered medications for this visit.     Allergies: No Known Allergies   Physical Exam: Blood pressure 146/63, pulse 86, temperature 98.3 F (36.8 C), temperature source Oral, resp. rate 18, height 5\' 9"  (1.753 m), weight 210 lb 9.6 oz (95.528 kg), SpO2 100.00%.  ECOG: 1  General appearance: Alert awake without any distress per Head: Normocephalic, without obvious abnormality, atraumatic Neck: no adenopathy, no thyroid masses. Lymph nodes: Cervical, supraclavicular, and axillary nodes normal. Heart:regular rate and rhythm, S1,  S2 normal, no murmur, click, rub or gallop Lung:chest clear, no wheezing, rales, normal symmetric air entry, no tachypnea, retractions or cyanosis Abdomen: soft, non-tender, without masses or organomegaly EXT:no erythema, induration, or nodules Neurological examination: No deficits noted.  Lab Results: Lab Results  Component Value Date   WBC 7.4 03/23/2014   HGB 11.0* 03/23/2014   HCT 33.8* 03/23/2014   MCV 92.6 03/23/2014   PLT 238 03/23/2014     Chemistry      Component Value Date/Time   NA 142 03/23/2014 1001   NA 144 07/05/2013 0609   K 4.2 03/23/2014 1001   K 3.7 07/05/2013 0609   CL 106 07/05/2013 0609   CL 106 02/01/2013 1120   CO2 26 03/23/2014 1001   CO2 27 04/09/2012 0500   BUN 14.6 03/23/2014 1001   BUN 11 07/05/2013 0609   CREATININE 1.0 03/23/2014 1001   CREATININE 1.00 07/05/2013 0609      Component Value Date/Time   CALCIUM 9.0 03/23/2014 1001   CALCIUM 8.6 04/09/2012 0500   ALKPHOS 77 03/23/2014 1001   ALKPHOS 96 01/14/2013 0952   AST 11 03/23/2014 1001   AST 15 01/14/2013 0952   ALT 6 03/23/2014 1001   ALT 17 01/14/2013 0952   BILITOT 0.57 03/23/2014 1001   BILITOT 0.7 01/14/2013 0952      Results for Alec Harris, Alec Harris (MRN [id number]) as of 03/23/2014 10:35  Ref. Range 12/29/2013 11:37 02/09/2014 13:35  PSA Latest Range: <=4.00 ng/mL 0.55 0.60    Impression and Plan:  This is a 68 year-old gentleman with the following issues:  1. Castration resistant prostate cancer. Currently on Zytiga and tolerating it well. His last PSA was 0.60 and continues to be stable.  He continues to tolerate this medication without any complications He will continue this medication unless his PSA started rising rapidly. The rise in his recent PSA has been very miniscule and was discussed with the patient extensively. I see no reason to change the dose her schedule of this medication or switch to a different regimen. I will continue to monitor him closely regarding.  2. Androgen deprivation. He remains on Lupron  under the care of Dr. Janice Norrie. Last given in June 2014.  3. Bone directed therapy. He has very minimal bone involvement, but we may consider this down the line. Continue calcium and vitamin D supplements.  4. Followup. In 6 weeks.     [name],FIRAS 8/6/201510:44 AM

## 2014-03-24 LAB — PSA: PSA: 0.71 ng/mL (ref <=4.00)

## 2014-04-17 ENCOUNTER — Other Ambulatory Visit: Payer: Self-pay | Admitting: *Deleted

## 2014-04-17 DIAGNOSIS — C61 Malignant neoplasm of prostate: Secondary | ICD-10-CM

## 2014-04-17 NOTE — Telephone Encounter (Signed)
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

## 2014-04-18 MED ORDER — ABIRATERONE ACETATE 250 MG PO TABS: 1000.0000 mg | ORAL_TABLET | Freq: Every day | ORAL | Status: DC

## 2014-04-18 NOTE — Telephone Encounter (Signed)
RECEIVED A FAX FROM [facility] CONCERNING A CONFIRMATION OF FACSIMILE RECEIPT FOR PT. REFERRAL. 

## 2014-04-18 NOTE — Addendum Note (Signed)
Addended by: Wyonia Hough on: 04/18/2014 10:53 AM   Modules accepted: Orders

## 2014-04-21 NOTE — Telephone Encounter (Signed)
RECEIVED A FAX FROM [facility] CONCERNING A CONFIRMATION OF PRESCRIPTION SHIPMENT FOR [place] ON 04/20/14.

## 2014-05-08 ENCOUNTER — Other Ambulatory Visit (HOSPITAL_BASED_OUTPATIENT_CLINIC_OR_DEPARTMENT_OTHER): Payer: Medicare Other

## 2014-05-08 ENCOUNTER — Ambulatory Visit (HOSPITAL_BASED_OUTPATIENT_CLINIC_OR_DEPARTMENT_OTHER): Payer: Medicare Other | Admitting: Physician Assistant

## 2014-05-08 ENCOUNTER — Encounter: Payer: Self-pay | Admitting: Physician Assistant

## 2014-05-08 ENCOUNTER — Telehealth: Payer: Self-pay | Admitting: Oncology

## 2014-05-08 VITALS — BP 130/55 | HR 64 | Temp 98.6°F | Resp 18 | Ht 69.0 in | Wt 208.6 lb

## 2014-05-08 DIAGNOSIS — C61 Malignant neoplasm of prostate: Secondary | ICD-10-CM

## 2014-05-08 DIAGNOSIS — C7951 Secondary malignant neoplasm of bone: Secondary | ICD-10-CM | POA: Diagnosis not present

## 2014-05-08 DIAGNOSIS — E291 Testicular hypofunction: Secondary | ICD-10-CM

## 2014-05-08 DIAGNOSIS — C779 Secondary and unspecified malignant neoplasm of lymph node, unspecified: Secondary | ICD-10-CM | POA: Diagnosis not present

## 2014-05-08 DIAGNOSIS — C7952 Secondary malignant neoplasm of bone marrow: Secondary | ICD-10-CM | POA: Diagnosis not present

## 2014-05-08 DIAGNOSIS — I6529 Occlusion and stenosis of unspecified carotid artery: Secondary | ICD-10-CM | POA: Diagnosis not present

## 2014-05-08 LAB — COMPREHENSIVE METABOLIC PANEL (CC13)
ALK PHOS: 86 U/L (ref 40–150)
ALT: 8 U/L (ref 0–55)
AST: 11 U/L (ref 5–34)
Albumin: 3.2 g/dL — ABNORMAL LOW (ref 3.5–5.0)
Anion Gap: 9 mEq/L (ref 3–11)
BILIRUBIN TOTAL: 0.78 mg/dL (ref 0.20–1.20)
BUN: 11.2 mg/dL (ref 7.0–26.0)
CO2: 25 mEq/L (ref 22–29)
Calcium: 8.8 mg/dL (ref 8.4–10.4)
Chloride: 109 mEq/L (ref 98–109)
Creatinine: 0.9 mg/dL (ref 0.7–1.3)
Glucose: 95 mg/dl (ref 70–140)
Potassium: 4.5 mEq/L (ref 3.5–5.1)
SODIUM: 143 meq/L (ref 136–145)
TOTAL PROTEIN: 6.9 g/dL (ref 6.4–8.3)

## 2014-05-08 LAB — CBC WITH DIFFERENTIAL/PLATELET
BASO%: 0.8 % (ref 0.0–2.0)
Basophils Absolute: 0.1 10*3/uL (ref 0.0–0.1)
EOS%: 2.1 % (ref 0.0–7.0)
Eosinophils Absolute: 0.2 10*3/uL (ref 0.0–0.5)
HCT: 36.5 % — ABNORMAL LOW (ref 38.4–49.9)
HGB: 11.7 g/dL — ABNORMAL LOW (ref 13.0–17.1)
LYMPH%: 44 % (ref 14.0–49.0)
MCH: 29 pg (ref 27.2–33.4)
MCHC: 32.1 g/dL (ref 32.0–36.0)
MCV: 90.3 fL (ref 79.3–98.0)
MONO#: 0.8 10*3/uL (ref 0.1–0.9)
MONO%: 10.5 % (ref 0.0–14.0)
NEUT#: 3.3 10*3/uL (ref 1.5–6.5)
NEUT%: 42.6 % (ref 39.0–75.0)
PLATELETS: 220 10*3/uL (ref 140–400)
RBC: 4.05 10*6/uL — AB (ref 4.20–5.82)
RDW: 16 % — ABNORMAL HIGH (ref 11.0–14.6)
WBC: 7.8 10*3/uL (ref 4.0–10.3)
lymph#: 3.4 10*3/uL — ABNORMAL HIGH (ref 0.9–3.3)

## 2014-05-08 NOTE — Progress Notes (Signed)
Hematology and Oncology Follow Up Visit  Alec Harris [id number] 1945/10/14 68 y.o. 05/08/2014 4:47 PM Alec Harris, MDCloward, [name], MD   Principle Diagnosis: 68 year old with Castration resistant prostate cancer. He was diagnosed in 2003 with Gleason score 4 + 4 = 8. Now he has metastatic disease with involvement of lymph nodes and bone.  Prior Therapy: He was initially diagnosed in 2003 with an elevated PSA. He underwent a prostatectomy with a pathology that showed prostatic adenocarcinoma. Gleason score 4+4 equals 8, with the tumor involving the margins and the pathologic staging was pT4 N1 with 1 of 2 left pelvic lymph nodes involved. He was started on adjuvant hormone therapy with Lupron. He developed a rising PSA and bone metastasis. He was then treated with ketoconazole and prednisone. He developed a rising PSA with a doubling time of just over 6 months. Bone scan in September 2013 showed increased uptake in the right lower lumbar spine at L4 and L5 and abnormal uptake in the mid sternum which had progressed since the prior bone scan.  Current therapy:  Zytiga 1000 mg daily started in October 2013.  He remains on Lupron at [facility] urology.  Interim History: Alec Harris returns for a routine visit unaccompanied. Since his last visit, he has done well without any complications. He has been on Zytiga since 05/2012 and is tolerating this well. He denied any chest pain, radiation, nausea or vomiting. He denied any bone pain.  He denies chest pain, shortness of breath or abdominal pain. Denied lower extremity edema or back pain. No neurological changes. Denies hematuria or other evidence of bleeding. He does however, report occasional areas of spontaneous bruising that resolved in 3-4 days. These tend to occur primarily on the upper extremities but occasionally will occur on the trunk of his body. He continues to adhere strictly to [facility] without any missed doses. He takes it in the morning with  the appropriate instructions. Has not reported any recent hospitalizations or illnesses. He has not reported any neurological symptoms such as headaches blurred vision or double vision. Has not reported any cough or hemoptysis or hematemesis. Does not report any hematochezia or melena. Does not report any frequency urgency or hesitancy. Does not report any rash or lesions. Does not report any petechiae. Remainder of his review of systems was unremarkable.  Medications: I have reviewed the patient's current medications. No changes on my review today. Current Outpatient Prescriptions  Medication Sig Dispense Refill  . abiraterone Acetate (ZYTIGA) 250 MG tablet Take 4 tablets (1,000 mg total) by mouth daily. Take on an empty stomach 1 hour before or 2 hours after a meal  120 tablet  0  . amLODipine (NORVASC) 5 MG tablet Take 5 mg by mouth daily.       [name] aspirin EC 81 MG tablet Take 81 mg by mouth daily.      . Calcium Carb-Cholecalciferol (CALCIUM 500 +D) 500-400 MG-UNIT TABS Take 1 tablet by mouth 2 (two) times daily.      . clopidogrel (PLAVIX) 75 MG tablet Take 75 mg by mouth daily with breakfast. Start taking 75 mg daily on 06/30/13; (to begin 5 days prior to procedure of 11/18)      . CRESTOR 10 MG tablet TAKE 1 TABLET BY MOUTH AT BEDTIME  30 tablet  5  . cilostazol (PLETAL) 100 MG tablet Take 1 tablet (100 mg total) by mouth 2 (two) times daily. PATIENT NEEDS AN APPOINTMENT FOR FUTURE REFILLS.  60 tablet  0   No current facility-administered medications for this visit.     Allergies: No Known Allergies   Physical Exam: Blood pressure 130/55, pulse 64, temperature 98.6 F (37 C), temperature source Oral, resp. rate 18, height 5\' 9"  (1.753 m), weight 208 lb 9.6 oz (94.62 kg).  ECOG: 1  General appearance: Alert awake without any distress per Head: Normocephalic, without obvious abnormality, atraumatic Neck: no adenopathy, no thyroid masses. Lymph nodes: Cervical, supraclavicular, and  axillary nodes normal. Heart:regular rate and rhythm, S1, S2 normal, no murmur, click, rub or gallop Lung:chest clear, no wheezing, rales, normal symmetric air entry, no tachypnea, retractions or cyanosis Abdomen: soft, non-tender, without masses or organomegaly EXT:no erythema, induration, or nodules. Trace - 1+ edema bilateral lower extremities Neurological examination: No deficits noted.  Lab Results: Lab Results  Component Value Date   WBC 7.8 05/08/2014   HGB 11.7* 05/08/2014   HCT 36.5* 05/08/2014   MCV 90.3 05/08/2014   PLT 220 05/08/2014     Chemistry      Component Value Date/Time   NA 143 05/08/2014 1035   NA 144 07/05/2013 0609   K 4.5 05/08/2014 1035   K 3.7 07/05/2013 0609   CL 106 07/05/2013 0609   CL 106 02/01/2013 1120   CO2 25 05/08/2014 1035   CO2 27 04/09/2012 0500   BUN 11.2 05/08/2014 1035   BUN 11 07/05/2013 0609   CREATININE 0.9 05/08/2014 1035   CREATININE 1.00 07/05/2013 0609      Component Value Date/Time   CALCIUM 8.8 05/08/2014 1035   CALCIUM 8.6 04/09/2012 0500   ALKPHOS 86 05/08/2014 1035   ALKPHOS 96 01/14/2013 0952   AST 11 05/08/2014 1035   AST 15 01/14/2013 0952   ALT 8 05/08/2014 1035   ALT 17 01/14/2013 0952   BILITOT 0.78 05/08/2014 1035   BILITOT 0.7 01/14/2013 0952      Results for Alec Harris (MRN [id number]) as of 03/23/2014 10:35  Ref. Range 12/29/2013 11:37 02/09/2014 13:35  PSA Latest Range: <=4.00 ng/mL 0.55 0.60    Impression and Plan:  This is a 68 year-old gentleman with the following issues:  1. Castration resistant prostate cancer. Currently on Zytiga and tolerating it well. His last PSA was 0.60 and continues to be stable.  He continues to tolerate this medication without any complications He will continue this medication unless his PSA started rising rapidly. The rise in his recent PSA  Continues to be in small increments.his PSA from 03/23/2014 was 0.71 in the PSA from 05/08/2014 and 0.89. Currently there is no reason to change the  dose or schedule of this medication or switch to a different regimen. We will continue to monitor him closely regarding.  2. Androgen deprivation. He remains on Lupron under the care of Dr. Janice Norrie. Last given in June 2014.  3. Bone directed therapy. He has very minimal bone involvement, but we may consider this down the line. Continue calcium and vitamin D supplements.  4. Followup. In 6 weeks.     [name] 9/21/20154:47 PM

## 2014-05-08 NOTE — Telephone Encounter (Signed)
gv and printed appt sched and avs for pt for NOV °

## 2014-05-09 LAB — PSA: PSA: 0.89 ng/mL (ref <=4.00)

## 2014-05-09 NOTE — Patient Instructions (Signed)
Continue Zytiga 1000 mg by mouth daily Follow up in approximately 6 weeks

## 2014-05-11 ENCOUNTER — Telehealth: Payer: Self-pay | Admitting: Medical Oncology

## 2014-05-11 NOTE — Telephone Encounter (Signed)
Patient requesting PSA results. Results given.

## 2014-05-18 ENCOUNTER — Other Ambulatory Visit: Payer: Self-pay | Admitting: *Deleted

## 2014-05-18 DIAGNOSIS — C61 Malignant neoplasm of prostate: Secondary | ICD-10-CM

## 2014-05-18 NOTE — Telephone Encounter (Signed)
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

## 2014-05-19 MED ORDER — ABIRATERONE ACETATE 250 MG PO TABS: 1000.0000 mg | ORAL_TABLET | Freq: Every day | ORAL | Status: DC

## 2014-05-19 NOTE — Addendum Note (Signed)
Addended by: Wyonia Hough on: 05/19/2014 08:47 AM   Modules accepted: Orders

## 2014-06-09 DIAGNOSIS — I10 Essential (primary) hypertension: Secondary | ICD-10-CM | POA: Diagnosis not present

## 2014-06-09 DIAGNOSIS — E785 Hyperlipidemia, unspecified: Secondary | ICD-10-CM | POA: Diagnosis not present

## 2014-06-09 DIAGNOSIS — R42 Dizziness and giddiness: Secondary | ICD-10-CM | POA: Diagnosis not present

## 2014-06-09 DIAGNOSIS — R5381 Other malaise: Secondary | ICD-10-CM | POA: Diagnosis not present

## 2014-06-12 ENCOUNTER — Other Ambulatory Visit: Payer: Self-pay | Admitting: Cardiovascular Disease

## 2014-06-13 NOTE — Telephone Encounter (Signed)
Rx was sent to pharmacy electronically. OV 08/22/2014

## 2014-06-16 ENCOUNTER — Other Ambulatory Visit: Payer: Self-pay | Admitting: Medical Oncology

## 2014-06-16 ENCOUNTER — Other Ambulatory Visit: Payer: Self-pay | Admitting: *Deleted

## 2014-06-16 DIAGNOSIS — C61 Malignant neoplasm of prostate: Secondary | ICD-10-CM

## 2014-06-16 MED ORDER — ABIRATERONE ACETATE 250 MG PO TABS: 1000.0000 mg | ORAL_TABLET | Freq: Every day | ORAL | Status: DC

## 2014-06-16 NOTE — Telephone Encounter (Signed)
Zytiga prescription refill faxed to [facility] @ [phone]

## 2014-06-16 NOTE — Telephone Encounter (Signed)
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

## 2014-06-19 NOTE — Telephone Encounter (Signed)
RECEIVED A FAX FROM [facility] CONCERNING A CONFIRMATION OF FACS[facility] RECEIPT. 

## 2014-06-20 ENCOUNTER — Telehealth: Payer: Self-pay | Admitting: Oncology

## 2014-06-20 ENCOUNTER — Other Ambulatory Visit (HOSPITAL_BASED_OUTPATIENT_CLINIC_OR_DEPARTMENT_OTHER): Payer: Medicare Other

## 2014-06-20 ENCOUNTER — Ambulatory Visit (HOSPITAL_BASED_OUTPATIENT_CLINIC_OR_DEPARTMENT_OTHER): Payer: Medicare Other | Admitting: Oncology

## 2014-06-20 VITALS — BP 153/54 | HR 79 | Temp 98.7°F | Resp 19 | Ht 69.0 in | Wt 214.0 lb

## 2014-06-20 DIAGNOSIS — C779 Secondary and unspecified malignant neoplasm of lymph node, unspecified: Secondary | ICD-10-CM | POA: Diagnosis not present

## 2014-06-20 DIAGNOSIS — C61 Malignant neoplasm of prostate: Secondary | ICD-10-CM | POA: Diagnosis not present

## 2014-06-20 DIAGNOSIS — E291 Testicular hypofunction: Secondary | ICD-10-CM

## 2014-06-20 DIAGNOSIS — C7951 Secondary malignant neoplasm of bone: Secondary | ICD-10-CM

## 2014-06-20 LAB — CBC WITH DIFFERENTIAL/PLATELET
BASO%: 0.6 % (ref 0.0–2.0)
Basophils Absolute: 0 10*3/uL (ref 0.0–0.1)
EOS%: 2 % (ref 0.0–7.0)
Eosinophils Absolute: 0.2 10*3/uL (ref 0.0–0.5)
HCT: 35.6 % — ABNORMAL LOW (ref 38.4–49.9)
HGB: 11.4 g/dL — ABNORMAL LOW (ref 13.0–17.1)
LYMPH%: 43.6 % (ref 14.0–49.0)
MCH: 28.6 pg (ref 27.2–33.4)
MCHC: 32 g/dL (ref 32.0–36.0)
MCV: 89.5 fL (ref 79.3–98.0)
MONO#: 0.9 10*3/uL (ref 0.1–0.9)
MONO%: 10.6 % (ref 0.0–14.0)
NEUT%: 43.2 % (ref 39.0–75.0)
NEUTROS ABS: 3.5 10*3/uL (ref 1.5–6.5)
Platelets: 238 10*3/uL (ref 140–400)
RBC: 3.97 10*6/uL — AB (ref 4.20–5.82)
RDW: 17.3 % — ABNORMAL HIGH (ref 11.0–14.6)
WBC: 8.1 10*3/uL (ref 4.0–10.3)
lymph#: 3.5 10*3/uL — ABNORMAL HIGH (ref 0.9–3.3)

## 2014-06-20 LAB — COMPREHENSIVE METABOLIC PANEL (CC13)
ALBUMIN: 3.2 g/dL — AB (ref 3.5–5.0)
ALK PHOS: 82 U/L (ref 40–150)
ALT: 7 U/L (ref 0–55)
AST: 10 U/L (ref 5–34)
Anion Gap: 7 mEq/L (ref 3–11)
BUN: 13.4 mg/dL (ref 7.0–26.0)
CALCIUM: 9.2 mg/dL (ref 8.4–10.4)
CO2: 26 mEq/L (ref 22–29)
Chloride: 110 mEq/L — ABNORMAL HIGH (ref 98–109)
Creatinine: 0.9 mg/dL (ref 0.7–1.3)
Glucose: 110 mg/dl (ref 70–140)
POTASSIUM: 4.2 meq/L (ref 3.5–5.1)
SODIUM: 143 meq/L (ref 136–145)
TOTAL PROTEIN: 6.7 g/dL (ref 6.4–8.3)
Total Bilirubin: 0.5 mg/dL (ref 0.20–1.20)

## 2014-06-20 NOTE — Telephone Encounter (Signed)
Gave avs & cal for Dec. °

## 2014-06-20 NOTE — Progress Notes (Signed)
Hematology and Oncology Follow Up Visit  Alec Harris [id number] 12-17-1945 67 y.o. 06/20/2014 3:10 PM AlecDAVIS L, MDCloward, [name], MD   Principle Diagnosis: 68 year old with Castration resistant prostate cancer. He was diagnosed in 2003 with Gleason score 4 + 4 = 8. Now he has metastatic disease with involvement of lymph nodes and bone.  Prior Therapy: He was initially diagnosed in 2003 with an elevated PSA. He underwent a prostatectomy with a pathology that showed prostatic adenocarcinoma. Gleason score 4+4 equals 8, with the tumor involving the margins and the pathologic staging was pT4 N1 with 1 of 2 left pelvic lymph nodes involved. He was started on adjuvant hormone therapy with Lupron. He developed a rising PSA and bone metastasis. He was then treated with ketoconazole and prednisone. He developed a rising PSA with a doubling time of just over 6 months. Bone scan in September 2013 showed increased uptake in the right lower lumbar spine at L4 and L5 and abnormal uptake in the mid sternum which had progressed since the prior bone scan.  Current therapy:  Zytiga 1000 mg daily started in October 2013.  He remains on Lupron at [facility] urology.  Interim History: [name] returns for a routine visit unaccompanied. Since his last visit, he reports no new issues. He denied any complications from the [facility]. He denied any chest pain, radiation, nausea or vomiting. He denied any bone pain.  He denies chest pain, shortness of breath or abdominal pain. Denied lower extremity edema or back pain. No neurological changes. Denies hematuria or other evidence of bleeding. Has not reported any recent hospitalizations or illnesses. He has not reported any neurological symptoms such as headaches blurred vision or double vision. Has not reported any cough or hemoptysis or hematemesis. Does not report any hematochezia or melena. Does not report any frequency urgency or hesitancy. Does not report any rash or  lesions. Does not report any petechiae. Remainder of his review of systems was unremarkable.  Medications: I have reviewed the patient's current medications. No changes on my review today. Current Outpatient Prescriptions  Medication Sig Dispense Refill  . abiraterone Acetate (ZYTIGA) 250 MG tablet Take 4 tablets (1,000 mg total) by mouth daily. Take on an empty stomach 1 hour before or 2 hours after a meal 120 tablet 0  . amLODipine (NORVASC) 5 MG tablet Take 5 mg by mouth daily.     [name] aspirin EC 81 MG tablet Take 81 mg by mouth daily.    . Calcium Carb-Cholecalciferol (CALCIUM 500 +D) 500-400 MG-UNIT TABS Take 1 tablet by mouth 2 (two) times daily.    . cilostazol (PLETAL) 100 MG tablet Take 1 tablet (100 mg total) by mouth 2 (two) times daily. 60 tablet 2  . clopidogrel (PLAVIX) 75 MG tablet Take 75 mg by mouth daily with breakfast. Start taking 75 mg daily on 06/30/13; (to begin 5 days prior to procedure of 11/18)    . CRESTOR 10 MG tablet TAKE 1 TABLET BY MOUTH AT BEDTIME 30 tablet 5   No current facility-administered medications for this visit.     Allergies: No Known Allergies   Physical Exam: Blood pressure 153/54, pulse 79, temperature 98.7 F (37.1 C), temperature source Oral, resp. rate 19, height 5\' 9"  (1.753 m), weight 214 lb (97.07 kg).  ECOG: 1  General appearance: Alert awake without any distress per Head: Normocephalic, without obvious abnormality, atraumatic Neck: no adenopathy, no thyroid masses. Lymph nodes: Cervical, supraclavicular, and axillary nodes normal. Heart:regular rate  and rhythm, S1, S2 normal, no murmur, click, rub or gallop Lung:chest clear, no wheezing, rales, normal symmetric air entry, no tachypnea, retractions or cyanosis Abdomen: soft, non-tender, without masses or organomegaly EXT:no erythema, induration, or nodules. Trace - 1+ edema bilateral lower extremities Neurological examination: No deficits noted.  Lab Results: Lab Results   Component Value Date   WBC 8.1 06/20/2014   HGB 11.4* 06/20/2014   HCT 35.6* 06/20/2014   MCV 89.5 06/20/2014   PLT 238 06/20/2014     Chemistry      Component Value Date/Time   NA 143 06/20/2014 1426   NA 144 07/05/2013 0609   K 4.2 06/20/2014 1426   K 3.7 07/05/2013 0609   CL 106 07/05/2013 0609   CL 106 02/01/2013 1120   CO2 26 06/20/2014 1426   CO2 27 04/09/2012 0500   BUN 13.4 06/20/2014 1426   BUN 11 07/05/2013 0609   CREATININE 0.9 06/20/2014 1426   CREATININE 1.00 07/05/2013 0609      Component Value Date/Time   CALCIUM 9.2 06/20/2014 1426   CALCIUM 8.6 04/09/2012 0500   ALKPHOS 82 06/20/2014 1426   ALKPHOS 96 01/14/2013 0952   AST 10 06/20/2014 1426   AST 15 01/14/2013 0952   ALT 7 06/20/2014 1426   ALT 17 01/14/2013 0952   BILITOT 0.50 06/20/2014 1426   BILITOT 0.7 01/14/2013 0952      Results for [name], [name] (MRN [id number]) as of 06/20/2014 15:02  Ref. Range 12/29/2013 11:37 02/09/2014 13:35 03/23/2014 10:01 05/08/2014 10:35  PSA Latest Range: <=4.00 ng/mL 0.55 0.60 0.71 0.89     Impression and Plan:  This is a 68 year-old gentleman with the following issues:  1. Castration resistant prostate cancer. Currently on Zytiga and tolerating it well. His last PSA was 0.89 and continues to changed very slowly. He continues to tolerate this medication without any complications. He will continue this medication unless his PSA started rising rapidly.  The rise recently have been very slowly.  2. Androgen deprivation. He remains on Lupron under the care of Dr. Janice Norrie. Last given in June 2014.  3. Bone directed therapy. He has very minimal bone involvement, but we may consider this down the line. Continue calcium and vitamin D supplements.  4. Followup. In 6 weeks.     BTDVVO,HYWVP 11/3/20153:10 PM

## 2014-06-21 LAB — PSA: PSA: 0.94 ng/mL (ref <=4.00)

## 2014-06-22 NOTE — Telephone Encounter (Signed)
RECEIVED A FAX FROM [facility] CONCERNING A CONFIRMATION OF PRESCRIPTION SHIPMENT FOR [place] ON 06/21/14.

## 2014-07-10 DIAGNOSIS — C61 Malignant neoplasm of prostate: Secondary | ICD-10-CM | POA: Diagnosis not present

## 2014-07-18 ENCOUNTER — Other Ambulatory Visit: Payer: Self-pay | Admitting: *Deleted

## 2014-07-18 DIAGNOSIS — C61 Malignant neoplasm of prostate: Secondary | ICD-10-CM

## 2014-07-18 MED ORDER — ABIRATERONE ACETATE 250 MG PO TABS: 1000.0000 mg | ORAL_TABLET | Freq: Every day | ORAL | Status: DC

## 2014-07-18 NOTE — Addendum Note (Signed)
Addended by: Wyonia Hough on: 07/18/2014 02:19 PM   Modules accepted: Orders

## 2014-07-18 NOTE — Telephone Encounter (Signed)
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

## 2014-07-20 NOTE — Telephone Encounter (Signed)
RECEIVED A FAX FROM [facility] CONCERNING A CONFIRMATION OF FACSIMILE RECEIPT FOR PT. REFERRAL. 

## 2014-07-20 NOTE — Telephone Encounter (Signed)
RECEIVED A FAX FROM [facility] CONCERNING A CONFIRMATION OF PRESCRIPTION SHIPMENT FOR [place] ON 07/19/14.

## 2014-07-27 ENCOUNTER — Encounter (HOSPITAL_COMMUNITY): Payer: Self-pay | Admitting: Cardiovascular Disease

## 2014-08-01 ENCOUNTER — Telehealth: Payer: Self-pay | Admitting: Physician Assistant

## 2014-08-01 ENCOUNTER — Ambulatory Visit (HOSPITAL_BASED_OUTPATIENT_CLINIC_OR_DEPARTMENT_OTHER): Payer: Medicare Other | Admitting: Physician Assistant

## 2014-08-01 ENCOUNTER — Encounter: Payer: Self-pay | Admitting: Physician Assistant

## 2014-08-01 ENCOUNTER — Other Ambulatory Visit (HOSPITAL_BASED_OUTPATIENT_CLINIC_OR_DEPARTMENT_OTHER): Payer: Medicare Other

## 2014-08-01 VITALS — BP 139/56 | HR 80 | Temp 99.1°F | Resp 18 | Ht 69.0 in | Wt 213.4 lb

## 2014-08-01 DIAGNOSIS — C61 Malignant neoplasm of prostate: Secondary | ICD-10-CM

## 2014-08-01 DIAGNOSIS — C779 Secondary and unspecified malignant neoplasm of lymph node, unspecified: Secondary | ICD-10-CM

## 2014-08-01 DIAGNOSIS — E291 Testicular hypofunction: Secondary | ICD-10-CM

## 2014-08-01 DIAGNOSIS — C7951 Secondary malignant neoplasm of bone: Secondary | ICD-10-CM

## 2014-08-01 LAB — CBC WITH DIFFERENTIAL/PLATELET
BASO%: 0.4 % (ref 0.0–2.0)
Basophils Absolute: 0 10*3/uL (ref 0.0–0.1)
EOS%: 2.1 % (ref 0.0–7.0)
Eosinophils Absolute: 0.2 10*3/uL (ref 0.0–0.5)
HEMATOCRIT: 30.2 % — AB (ref 38.4–49.9)
HGB: 9.8 g/dL — ABNORMAL LOW (ref 13.0–17.1)
LYMPH%: 42.8 % (ref 14.0–49.0)
MCH: 28.8 pg (ref 27.2–33.4)
MCHC: 32.5 g/dL (ref 32.0–36.0)
MCV: 88.8 fL (ref 79.3–98.0)
MONO#: 0.7 10*3/uL (ref 0.1–0.9)
MONO%: 8.2 % (ref 0.0–14.0)
NEUT#: 3.8 10*3/uL (ref 1.5–6.5)
NEUT%: 46.5 % (ref 39.0–75.0)
Platelets: 249 10*3/uL (ref 140–400)
RBC: 3.4 10*6/uL — AB (ref 4.20–5.82)
RDW: 16.3 % — ABNORMAL HIGH (ref 11.0–14.6)
WBC: 8.1 10*3/uL (ref 4.0–10.3)
lymph#: 3.5 10*3/uL — ABNORMAL HIGH (ref 0.9–3.3)

## 2014-08-01 LAB — COMPREHENSIVE METABOLIC PANEL (CC13)
ALK PHOS: 87 U/L (ref 40–150)
ALT: 10 U/L (ref 0–55)
ANION GAP: 8 meq/L (ref 3–11)
AST: 11 U/L (ref 5–34)
Albumin: 3.1 g/dL — ABNORMAL LOW (ref 3.5–5.0)
BILIRUBIN TOTAL: 0.42 mg/dL (ref 0.20–1.20)
BUN: 13.6 mg/dL (ref 7.0–26.0)
CO2: 26 meq/L (ref 22–29)
CREATININE: 1 mg/dL (ref 0.7–1.3)
Calcium: 8.9 mg/dL (ref 8.4–10.4)
Chloride: 108 mEq/L (ref 98–109)
EGFR: >90 mL/min/{1.73_m2} (ref >90)
Glucose: 116 mg/dl (ref 70–140)
Potassium: 4.2 mEq/L (ref 3.5–5.1)
Sodium: 142 mEq/L (ref 136–145)
Total Protein: 6.4 g/dL (ref 6.4–8.3)

## 2014-08-01 NOTE — Patient Instructions (Signed)
Continue Zytiga 1000 mg by mouth daily Follow up in 6 weeks

## 2014-08-01 NOTE — Progress Notes (Signed)
Hematology and Oncology Follow Up Visit  Alec Harris [id number] [birth year] 68 y.o. 08/01/2014 4:09 PM Alec Harris, MDCloward, Alec Rossetti, MD   Principle Diagnosis: 68 year old with Castration resistant prostate cancer. He was diagnosed in 2003 with Gleason score 4 + 4 = 8. Now he has metastatic disease with involvement of lymph nodes and bone.  Prior Therapy: He was initially diagnosed in 2003 with an elevated PSA. He underwent a prostatectomy with a pathology that showed prostatic adenocarcinoma. Gleason score 4+4 equals 8, with the tumor involving the margins and the pathologic staging was pT4 N1 with 1 of 2 left pelvic lymph nodes involved. He was started on adjuvant hormone therapy with Lupron. He developed a rising PSA and bone metastasis. He was then treated with ketoconazole and prednisone. He developed a rising PSA with a doubling time of just over 6 months. Bone scan in September 2013 showed increased uptake in the right lower lumbar spine at L4 and L5 and abnormal uptake in the mid sternum which had progressed since the prior bone scan.  Current therapy:  Zytiga 1000 mg daily started in October 2013.  He remains on Lupron at [facility] urology.  Interim History: Alec Harris returns for a routine visit unaccompanied. Since his last visit, he reports no new issues. He denied any complications from the [facility]. He denied any chest pain, radiation, nausea or vomiting. He denied any bone pain.  He denies chest pain, shortness of breath or abdominal pain. Denied lower extremity edema or back pain. No neurological changes. Denies hematuria or other evidence of bleeding. Has not reported any recent hospitalizations or illnesses. He has not reported any neurological symptoms such as headaches blurred vision or double vision. Has not reported any cough or hemoptysis or hematemesis. Does not report any hematochezia or melena. He does report some occasional issues with constipation. He will also  occasionally see some small amounts of bright red blood per rectum. He had a colonoscopy the most recent by Dr. Carol Ada on 07/25/2011 which revealed both internal and external hemorrhoids. Does not report any frequency urgency or hesitancy. Does not report any rash or lesions. Does not report any petechiae. Remainder of his review of systems was unremarkable.  Medications: I have reviewed the patient's current medications. No changes on my review today. Current Outpatient Prescriptions  Medication Sig Dispense Refill  . abiraterone Acetate (ZYTIGA) 250 MG tablet Take 4 tablets (1,000 mg total) by mouth daily. Take on an empty stomach 1 hour before or 2 hours after a meal 120 tablet 0  . amLODipine (NORVASC) 5 MG tablet Take 5 mg by mouth daily.     [name] aspirin EC 81 MG tablet Take 81 mg by mouth daily.    . Calcium Carb-Cholecalciferol (CALCIUM 500 +D) 500-400 MG-UNIT TABS Take 1 tablet by mouth 2 (two) times daily.    . cilostazol (PLETAL) 100 MG tablet Take 1 tablet (100 mg total) by mouth 2 (two) times daily. 60 tablet 2  . clopidogrel (PLAVIX) 75 MG tablet Take 75 mg by mouth daily with breakfast. Start taking 75 mg daily on 06/30/13; (to begin 5 days prior to procedure of 11/18)    . CRESTOR 10 MG tablet TAKE 1 TABLET BY MOUTH AT BEDTIME 30 tablet 5   No current facility-administered medications for this visit.     Allergies: No Known Allergies   Physical Exam: Blood pressure 139/56, pulse 80, temperature 99.1 F (37.3 C), temperature source Oral, resp. rate 18, height 5'  9" (1.753 m), weight 213 lb 6.4 oz (96.798 kg).  ECOG: 1  General appearance: Alert awake without any distress per Head: Normocephalic, without obvious abnormality, atraumatic Neck: no adenopathy, no thyroid masses. Lymph nodes: Cervical, supraclavicular, and axillary nodes normal. Heart:regular rate and rhythm, S1, S2 normal, no murmur, click, rub or gallop Lung:chest clear, no wheezing, rales, normal  symmetric air entry, no tachypnea, retractions or cyanosis Abdomen: soft, non-tender, without masses or organomegaly EXT:no erythema, induration, or nodules. Trace - 1+ edema bilateral lower extremities Neurological examination: No deficits noted.  Lab Results: Lab Results  Component Value Date   WBC 8.1 08/01/2014   HGB 9.8* 08/01/2014   HCT 30.2* 08/01/2014   MCV 88.8 08/01/2014   PLT 249 08/01/2014     Chemistry      Component Value Date/Time   NA 142 08/01/2014 1415   NA 144 07/05/2013 0609   K 4.2 08/01/2014 1415   K 3.7 07/05/2013 0609   CL 106 07/05/2013 0609   CL 106 02/01/2013 1120   CO2 26 08/01/2014 1415   CO2 27 04/09/2012 0500   BUN 13.6 08/01/2014 1415   BUN 11 07/05/2013 0609   CREATININE 1.0 08/01/2014 1415   CREATININE 1.00 07/05/2013 0609      Component Value Date/Time   CALCIUM 8.9 08/01/2014 1415   CALCIUM 8.6 04/09/2012 0500   ALKPHOS 87 08/01/2014 1415   ALKPHOS 96 01/14/2013 0952   AST 11 08/01/2014 1415   AST 15 01/14/2013 0952   ALT 10 08/01/2014 1415   ALT 17 01/14/2013 0952   BILITOT 0.42 08/01/2014 1415   BILITOT 0.7 01/14/2013 0952      Results for Alec Harris (MRN [id number]) as of 06/20/2014 15:02  Ref. Range 12/29/2013 11:37 02/09/2014 13:35 03/23/2014 10:01 05/08/2014 10:35  PSA Latest Range: <=4.00 ng/mL 0.55 0.60 0.71 0.89     Impression and Plan:  This is a 68 year-old gentleman with the following issues:  1. Castration resistant prostate cancer. Currently on Zytiga and tolerating it well. His last PSA was 0.94 and continues to changed very slowly. He continues to tolerate this medication without any complications. He will continue this medication unless his PSA starts rising rapidly.  The rise recently have been very slowly.  2. Androgen deprivation. He remains on Lupron under the care of Dr. Janice Norrie. Last given in June 2014.  3. Bone directed therapy. He has very minimal bone involvement, but we may consider this down the line.  Continue calcium and vitamin D supplements.  4. Followup. In 6 weeks.    [name], PA-C 12/15/20154:09 PM

## 2014-08-01 NOTE — Telephone Encounter (Signed)
Gave avs & cal for Jan. °

## 2014-08-02 LAB — PSA: PSA: 1.11 ng/mL (ref <=4.00)

## 2014-08-08 ENCOUNTER — Other Ambulatory Visit: Payer: Self-pay | Admitting: *Deleted

## 2014-08-08 DIAGNOSIS — C61 Malignant neoplasm of prostate: Secondary | ICD-10-CM

## 2014-08-08 MED ORDER — ABIRATERONE ACETATE 250 MG PO TABS: 1000.0000 mg | ORAL_TABLET | Freq: Every day | ORAL | Status: DC

## 2014-08-08 NOTE — Telephone Encounter (Signed)
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

## 2014-08-22 ENCOUNTER — Ambulatory Visit: Payer: Medicare Other | Admitting: Cardiovascular Disease

## 2014-09-07 ENCOUNTER — Encounter: Payer: Self-pay | Admitting: Family

## 2014-09-08 ENCOUNTER — Other Ambulatory Visit: Payer: Self-pay | Admitting: Surgery

## 2014-09-08 ENCOUNTER — Other Ambulatory Visit: Payer: Self-pay | Admitting: Cardiovascular Disease

## 2014-09-08 DIAGNOSIS — I739 Peripheral vascular disease, unspecified: Secondary | ICD-10-CM

## 2014-09-10 NOTE — Telephone Encounter (Signed)
Rx(s) sent to pharmacy electronically. OV 09/15/14

## 2014-09-10 NOTE — Telephone Encounter (Signed)
Rx(s) sent to pharmacy electronically. OV

## 2014-09-11 ENCOUNTER — Encounter: Payer: Self-pay | Admitting: Family

## 2014-09-11 ENCOUNTER — Ambulatory Visit (HOSPITAL_COMMUNITY)
Admission: RE | Admit: 2014-09-11 | Discharge: 2014-09-11 | Disposition: A | Discharge status: Home / Self Care | Payer: Medicare Other | Source: Ambulatory Visit | Attending: Family | Admitting: Family

## 2014-09-11 ENCOUNTER — Ambulatory Visit (INDEPENDENT_AMBULATORY_CARE_PROVIDER_SITE_OTHER): Payer: Medicare Other | Admitting: Family

## 2014-09-11 VITALS — BP 119/66 | HR 71 | Resp 16 | Ht 69.0 in | Wt 214.0 lb

## 2014-09-11 DIAGNOSIS — I6523 Occlusion and stenosis of bilateral carotid arteries: Secondary | ICD-10-CM

## 2014-09-11 DIAGNOSIS — F17200 Nicotine dependence, unspecified, uncomplicated: Secondary | ICD-10-CM

## 2014-09-11 DIAGNOSIS — Z720 Tobacco use: Secondary | ICD-10-CM

## 2014-09-11 DIAGNOSIS — Z48812 Encounter for surgical aftercare following surgery on the circulatory system: Secondary | ICD-10-CM | POA: Insufficient documentation

## 2014-09-11 DIAGNOSIS — Z9889 Other specified postprocedural states: Secondary | ICD-10-CM | POA: Diagnosis not present

## 2014-09-11 DIAGNOSIS — R0989 Other specified symptoms and signs involving the circulatory and respiratory systems: Secondary | ICD-10-CM | POA: Diagnosis not present

## 2014-09-11 NOTE — Progress Notes (Signed)
Established Carotid Patient   History of Present Illness  Alec Harris is a 69 y.o. male patient of Dr. Trula Slade who is s/p left carotid stenting with distal embolic protection on 63/08/6008.   He has previously undergone a right carotid endarterectomy in 04/08/2012. He returns today for follow up.  The patient denies any history of TIA or stroke symptoms, specifically the patient denies a history of amaurosis fugax or monocular blindness, denies a history unilateral  of facial drooping, denies a history of hemiplegia, and denies a history of receptive or expressive aphasia.  Dr. Gwenlyn Found has been following him for his PAD, sees Dr. Gwenlyn Found 09/15/14, pt seems to have some claudication, denies non healing wounds.   The patient denies New Medical or Surgical History.  Pt Diabetic: No Pt smoker: smoker  (1/3 ppd, started at age 87 yrs)  Pt meds include: Statin : Yes ASA: Yes Other anticoagulants/antiplatelets: Plavix, Pletal, prescribed by Dr. Gwenlyn Found   Past Medical History  Diagnosis Date  . Prostate cancer   . Rectal bleeding 07/24/11    Diverticular Bleeding  . PAD (peripheral artery disease) 01/2012    severe diffuse his iliacs,SFA, carotids  . Carotid artery disease     By doppler  . Hyperlipemia   . Claudication 02/06/2012    ABI of 0.5 on both legs ,Rgt ext. iliac70 to 99%,rgt SFA occlude,lft ext.iliac common fem 70 to 99% ,Lft SFA occluded,mono flow popliteal    Social History History  Substance Use Topics  . Smoking status: Light Tobacco Smoker -- 50 years    Types: Cigarettes  . Smokeless tobacco: Never Used     Comment: pt states that he is going to quit completely; 1/2 pack per day  . Alcohol Use: 3.0 oz/week    3 Cans of beer, 2 Shots of liquor per week    Family History Family History  Problem Relation Age of Onset  . Hypertension Mother   . Heart attack Mother   . Heart disease Mother     CHF/  After age 53  . Heart attack Father   . Heart disease Father      Irregular Heart beat, Heart Disease before age 21  . Cancer Brother     throat    Surgical History Past Surgical History  Procedure Laterality Date  . Knee cartilage surgery      surgery for a torn ligament  . Cervical spine surgery  approx. 10 years ago    disc removed from neck  . Colonoscopy  07/25/2011    Procedure: COLONOSCOPY;  Surgeon: Beryle Beams;  Location: [facility] ENDOSCOPY;  Service: Endoscopy;  Laterality: N/A;  . Prostate surgery  2003    for  prostate cancer  . Penile prosthesis implant    . Endarterectomy  04/08/2012    Procedure: ENDARTERECTOMY CAROTID;  Surgeon: Serafina Mitchell, MD;  Location: [facility] OR;  Service: Vascular;  Laterality: Right;  Right carotid endarterectomy with vascuguard 1cm x 6cm bovine patch angioplasty.   . Carotid angiogram  03/02/2012    bilateral carotid artery stenosis,95% right and 80% left with a type 2 arch  . Pv angiogram  03/02/2012    total SFAs bilaterally with 3- vessel runoff below the knee, high-grade calcified iliac diseaese  . Doppler echocardiography  03/24/2012    EF >70% LV normal  . Lexiscan myoview  01/14/2012    small fixed basal to mid inferior bowel attenuation artifact. no reversible ischemia  . Carotid endarterectomy Right 04-08-12  CEA  . Carotid angiogram N/A 03/02/2012    Procedure: CAROTID ANGIOGRAM;  Surgeon: Lorretta Harp, MD;  Location: [facility] CATH LAB;  Service: Cardiovascular;  Laterality: N/A;  . Lower extremity angiogram N/A 03/02/2012    Procedure: LOWER EXTREMITY ANGIOGRAM;  Surgeon: Lorretta Harp, MD;  Location: [facility] CATH LAB;  Service: Cardiovascular;  Laterality: N/A;  . Carotid stent insertion Left 07/05/2013    Procedure: CAROTID STENT INSERTION;  Surgeon: Serafina Mitchell, MD;  Location: [facility] CATH LAB;  Service: Cardiovascular;  Laterality: Left;    No Known Allergies  Current Outpatient Prescriptions  Medication Sig Dispense Refill  . abiraterone Acetate (ZYTIGA) 250 MG tablet Take 4 tablets (1,000 mg  total) by mouth daily. Take on an empty stomach 1 hour before or 2 hours after a meal 120 tablet 0  . aspirin EC 81 MG tablet Take 81 mg by mouth daily.    . Calcium Carb-Cholecalciferol (CALCIUM 500 +D) 500-400 MG-UNIT TABS Take 1 tablet by mouth 2 (two) times daily.    . cilostazol (PLETAL) 100 MG tablet Take 1 tablet (100 mg total) by mouth 2 (two) times daily. 60 tablet 0  . clopidogrel (PLAVIX) 75 MG tablet Take 1 tablet (75 mg total) by mouth daily. 30 tablet 6  . CRESTOR 10 MG tablet TAKE 1 TABLET BY MOUTH AT BEDTIME 30 tablet 5  . amLODipine (NORVASC) 5 MG tablet Take 5 mg by mouth daily.      No current facility-administered medications for this visit.    Review of Systems : See HPI for pertinent positives and negatives.  Physical Examination  Filed Vitals:   09/11/14 1609 09/11/14 1612  BP: 135/70 119/66  Pulse: 74 71  Resp:  16  Height:  5\' 9"  (1.753 m)  Weight:  214 lb (97.07 kg)  SpO2:  98%   Body mass index is 31.59 kg/(m^2).  General: WDWN obese male in NAD GAIT: normal Eyes: PERRLA Pulmonary:  Non-labored, CTAB with decreased air movement in all fields, Negative  Rales, Negative rhonchi, & Negative wheezing.  Cardiac: regular Rhythm,  Positive murmur.  VASCULAR EXAM Carotid Bruits Right Left   Transmitted cardiac murmur Transmitted cardiac murmur     Aorta is  palpable. Radial pulses are 2+ palpable and equal.                                                                                                                            LE Pulses Right Left       FEMORAL  not palpable  not palpable        POPLITEAL  not palpable   not palpable       POSTERIOR TIBIAL  not palpable   not palpable        DORSALIS PEDIS      ANTERIOR TIBIAL not palpable  not palpable     Gastrointestinal: soft, nontender, BS WNL, no r/g,  negative palpated masses.  Musculoskeletal: Negative  muscle atrophy/wasting. M/S 5/5 throughout, Extremities without ischemic  changes.  Neurologic: A&O X 3; Appropriate Affect,  Speech is normal CN 2-12 intact, Pain and light touch intact in extremities, Motor exam as listed above.   Non-Invasive Vascular Imaging CAROTID DUPLEX 09/11/2014   CEREBROVASCULAR DUPLEX EVALUATION     INDICATION: Carotid artery stenosis     PREVIOUS INTERVENTION(S): Right carotid endarterectomy 04/08/2012; Left internal carotid artery stent 07/05/2013    DUPLEX EXAM:     RIGHT  LEFT  Peak Systolic Velocities (cm/s) End Diastolic Velocities (cm/s) Plaque LOCATION Peak Systolic Velocities (cm/s) End Diastolic Velocities (cm/s) Plaque  127 13  CCA PROXIMAL 117 22 HT  91 14  CCA MID 119 22 HT  59 7 HM CCA DISTAL 60 12 HT  104 6 HT ECA 130 5 HT  86 27 HM ICA PROXIMAL stent stent   88 30  ICA MID 94 30   100 35  ICA DISTAL 118 33     Carotid endarterectomy  ICA / CCA Ratio (PSV) Left internal carotid stent  Antegrade Vertebral Flow Antegrade  811 Brachial Systolic Pressure (mmHg) 572  Triphasic Brachial Artery Waveforms Triphasic    Peak Systolic Velocities (cm/s) End Diastolic Velocities (cm/s) Plaque STENT (62/10/5595 LICA ) Peak Systolic Velocities (cm/s) End Diastolic Velocities (cm/s) Plaque     PROXIMAL 39 11      MID 40 12      DISTAL 83 26     Plaque Morphology:  HM = Homogeneous, HT = Heterogeneous, CP = Calcific Plaque, SP = Smooth Plaque, IP = Irregular Plaque    ADDITIONAL FINDINGS:     IMPRESSION: Patent right internal carotid artery with history of carotid endarterectomy, minimal hyperplasia present without hemodynamic changes. Left internal carotid artery is patent with history of stent, no hyperplasia or hemodynamically significant changes present.    Compared to the previous exam:  Essentially unchanged since previous study on 03/06/2014.      Assessment: Labib Cwynar is a 69 y.o. male who is s/p right carotid endarterectomy on 04/08/2012 and left internal carotid artery stent placement  on  07/05/2013. He presents with asymptomatic patent right internal carotid artery with history of carotid endarterectomy, minimal hyperplasia present without hemodynamic changes. Left internal carotid artery is patent with history of stent, no hyperplasia or hemodynamically significant changes present. Essentially unchanged since previous study on 03/06/2014.  Dr. Gwenlyn Found has been monitoring pt's PAD.  Unfortunately pt continues to smoke but states he is ready to quit.  New problem: Pt has a palpable aortic pulse which should not be palpable with an obese abdomen; review of records reveals no [place] of abdomen nor CT imaging of abdomen on file. Will check aortic Duplex in 2 weeks.   Face to face time with patient was 25 minutes. Over 50% of this time was spent on counseling and coordination of care.   Plan: Follow-up in 2 weeks with AAA Duplex and 1 year with Carotid Duplex. The patient was counseled re smoking cessation and given several free resources re smoking cessation.    I discussed in depth with the patient the nature of atherosclerosis, and emphasized the importance of maximal medical management including strict control of blood pressure, blood glucose, and lipid levels, obtaining regular exercise, and cessation of smoking.  The patient is aware that without maximal medical management the underlying atherosclerotic disease process will progress, limiting the benefit of any interventions. The patient was given information about stroke prevention and what symptoms should prompt  the patient to seek immediate medical care. Thank you for allowing [place] to participate in this patient's care.  Clemon Chambers, RN, MSN, FNP-C [facility] Office: [phone]  Clinic Physician: Early on call  09/11/2014  4:35 PM

## 2014-09-11 NOTE — Patient Instructions (Signed)
Stroke Prevention Some medical conditions and behaviors are associated with an increased chance of having a stroke. You may prevent a stroke by making healthy choices and managing medical conditions. HOW CAN I REDUCE MY RISK OF HAVING A STROKE?   Stay physically active. Get at least 30 minutes of activity on most or all days.  Do not smoke. It may also be helpful to avoid exposure to secondhand smoke.  Limit alcohol use. Moderate alcohol use is considered to be:  No more than 2 drinks per day for men.  No more than 1 drink per day for nonpregnant women.  Eat healthy foods. This involves:  Eating 5 or more servings of fruits and vegetables a day.  Making dietary changes that address high blood pressure (hypertension), high cholesterol, diabetes, or obesity.  Manage your cholesterol levels.  Making food choices that are high in fiber and low in saturated fat, trans fat, and cholesterol may control cholesterol levels.  Take any prescribed medicines to control cholesterol as directed by your health care provider.  Manage your diabetes.  Controlling your carbohydrate and sugar intake is recommended to manage diabetes.  Take any prescribed medicines to control diabetes as directed by your health care provider.  Control your hypertension.  Making food choices that are low in salt (sodium), saturated fat, trans fat, and cholesterol is recommended to manage hypertension.  Take any prescribed medicines to control hypertension as directed by your health care provider.  Maintain a healthy weight.  Reducing calorie intake and making food choices that are low in sodium, saturated fat, trans fat, and cholesterol are recommended to manage weight.  Stop drug abuse.  Avoid taking birth control pills.  Talk to your health care provider about the risks of taking birth control pills if you are over 35 years old, smoke, get migraines, or have ever had a blood clot.  Get evaluated for sleep  disorders (sleep apnea).  Talk to your health care provider about getting a sleep evaluation if you snore a lot or have excessive sleepiness.  Take medicines only as directed by your health care provider.  For some people, aspirin or blood thinners (anticoagulants) are helpful in reducing the risk of forming abnormal blood clots that can lead to stroke. If you have the irregular heart rhythm of atrial fibrillation, you should be on a blood thinner unless there is a good reason you cannot take them.  Understand all your medicine instructions.  Make sure that other conditions (such as anemia or atherosclerosis) are addressed. SEEK IMMEDIATE MEDICAL CARE IF:   You have sudden weakness or numbness of the face, arm, or leg, especially on one side of the body.  Your face or eyelid droops to one side.  You have sudden confusion.  You have trouble speaking (aphasia) or understanding.  You have sudden trouble seeing in one or both eyes.  You have sudden trouble walking.  You have dizziness.  You have a loss of balance or coordination.  You have a sudden, severe headache with no known cause.  You have new chest pain or an irregular heartbeat. Any of these symptoms may represent a serious problem that is an emergency. Do not wait to see if the symptoms will go away. Get medical help at once. Call your local emergency services (911 in [place] Do not drive yourself to the hospital. Document Released: 09/11/2004 Document Revised: 12/19/2013 Document Reviewed: 02/04/2013 ExitCare Patient Information 2015 [employer]. This information is not intended to replace advice given   to you by your health care provider. Make sure you discuss any questions you have with your health care provider.    Smoking Cessation Quitting smoking is important to your health and has many advantages. However, it is not always easy to quit since nicotine is a very addictive drug. Oftentimes, people try 3 times or  more before being able to quit. This document explains the best ways for you to prepare to quit smoking. Quitting takes hard work and a lot of effort, but you can do it. ADVANTAGES OF QUITTING SMOKING  You will live longer, feel better, and live better.  Your body will feel the impact of quitting smoking almost immediately.  Within 20 minutes, blood pressure decreases. Your pulse returns to its normal level.  After 8 hours, carbon monoxide levels in the blood return to normal. Your oxygen level increases.  After 24 hours, the chance of having a heart attack starts to decrease. Your breath, hair, and body stop smelling like smoke.  After 48 hours, damaged nerve endings begin to recover. Your sense of taste and smell improve.  After 72 hours, the body is virtually free of nicotine. Your bronchial tubes relax and breathing becomes easier.  After 2 to 12 weeks, lungs can hold more air. Exercise becomes easier and circulation improves.  The risk of having a heart attack, stroke, cancer, or lung disease is greatly reduced.  After 1 year, the risk of coronary heart disease is cut in half.  After 5 years, the risk of stroke falls to the same as a nonsmoker.  After 10 years, the risk of lung cancer is cut in half and the risk of other cancers decreases significantly.  After 15 years, the risk of coronary heart disease drops, usually to the level of a nonsmoker.  If you are pregnant, quitting smoking will improve your chances of having a healthy baby.  The people you live with, especially any children, will be healthier.  You will have extra money to spend on things other than cigarettes. QUESTIONS TO THINK ABOUT BEFORE ATTEMPTING TO QUIT You may want to talk about your answers with your health care provider.  Why do you want to quit?  If you tried to quit in the past, what helped and what did not?  What will be the most difficult situations for you after you quit? How will you plan to  handle them?  Who can help you through the tough times? Your family? Friends? A health care provider?  What pleasures do you get from smoking? What ways can you still get pleasure if you quit? Here are some questions to ask your health care provider:  How can you help me to be successful at quitting?  What medicine do you think would be best for me and how should I take it?  What should I do if I need more help?  What is smoking withdrawal like? How can I get information on withdrawal? GET READY  Set a quit date.  Change your environment by getting rid of all cigarettes, ashtrays, matches, and lighters in your home, car, or work. Do not let people smoke in your home.  Review your past attempts to quit. Think about what worked and what did not. GET SUPPORT AND ENCOURAGEMENT You have a better chance of being successful if you have help. You can get support in many ways.  Tell your family, friends, and coworkers that you are going to quit and need their support. Ask them   not to smoke around you.  Get individual, group, or telephone counseling and support. Programs are available at local hospitals and health centers. Call your local health department for information about programs in your area.  Spiritual beliefs and practices may help some smokers quit.  Download a "quit meter" on your computer to keep track of quit statistics, such as how long you have gone without smoking, cigarettes not smoked, and money saved.  Get a self-help book about quitting smoking and staying off tobacco. LEARN NEW SKILLS AND BEHAVIORS  Distract yourself from urges to smoke. Talk to someone, go for a walk, or occupy your time with a task.  Change your normal routine. Take a different route to work. Drink tea instead of coffee. Eat breakfast in a different place.  Reduce your stress. Take a hot bath, exercise, or read a book.  Plan something enjoyable to do every day. Reward yourself for not  smoking.  Explore interactive web-based programs that specialize in helping you quit. GET MEDICINE AND USE IT CORRECTLY Medicines can help you stop smoking and decrease the urge to smoke. Combining medicine with the above behavioral methods and support can greatly increase your chances of successfully quitting smoking.  Nicotine replacement therapy helps deliver nicotine to your body without the negative effects and risks of smoking. Nicotine replacement therapy includes nicotine gum, lozenges, inhalers, nasal sprays, and skin patches. Some may be available over-the-counter and others require a prescription.  Antidepressant medicine helps people abstain from smoking, but how this works is unknown. This medicine is available by prescription.  Nicotinic receptor partial agonist medicine simulates the effect of nicotine in your brain. This medicine is available by prescription. Ask your health care provider for advice about which medicines to use and how to use them based on your health history. Your health care provider will tell you what side effects to look out for if you choose to be on a medicine or therapy. Carefully read the information on the package. Do not use any other product containing nicotine while using a nicotine replacement product.  RELAPSE OR DIFFICULT SITUATIONS Most relapses occur within the first 3 months after quitting. Do not be discouraged if you start smoking again. Remember, most people try several times before finally quitting. You may have symptoms of withdrawal because your body is used to nicotine. You may crave cigarettes, be irritable, feel very hungry, cough often, get headaches, or have difficulty concentrating. The withdrawal symptoms are only temporary. They are strongest when you first quit, but they will go away within 10-14 days. To reduce the chances of relapse, try to:  Avoid drinking alcohol. Drinking lowers your chances of successfully quitting.  Reduce the  amount of caffeine you consume. Once you quit smoking, the amount of caffeine in your body increases and can give you symptoms, such as a rapid heartbeat, sweating, and anxiety.  Avoid smokers because they can make you want to smoke.  Do not let weight gain distract you. Many smokers will gain weight when they quit, usually less than 10 pounds. Eat a healthy diet and stay active. You can always lose the weight gained after you quit.  Find ways to improve your mood other than smoking. FOR MORE INFORMATION  www.smokefree.gov  Document Released: 07/29/2001 Document Revised: 12/19/2013 Document Reviewed: 11/13/2011 ExitCare Patient Information 2015 [employer]. This information is not intended to replace advice given to you by your health care provider. Make sure you discuss any questions you have with your health   care provider.    Smoking Cessation, Tips for Success If you are ready to quit smoking, congratulations! You have chosen to help yourself be healthier. Cigarettes bring nicotine, tar, carbon monoxide, and other irritants into your body. Your lungs, heart, and blood vessels will be able to work better without these poisons. There are many different ways to quit smoking. Nicotine gum, nicotine patches, a nicotine inhaler, or nicotine nasal spray can help with physical craving. Hypnosis, support groups, and medicines help break the habit of smoking. WHAT THINGS CAN I DO TO MAKE QUITTING EASIER?  Here are some tips to help you quit for good:  Pick a date when you will quit smoking completely. Tell all of your friends and family about your plan to quit on that date.  Do not try to slowly cut down on the number of cigarettes you are smoking. Pick a quit date and quit smoking completely starting on that day.  Throw away all cigarettes.   Clean and remove all ashtrays from your home, work, and car.  On a card, write down your reasons for quitting. Carry the card with you and read it  when you get the urge to smoke.  Cleanse your body of nicotine. Drink enough water and fluids to keep your urine clear or pale yellow. Do this after quitting to flush the nicotine from your body.  Learn to predict your moods. Do not let a bad situation be your excuse to have a cigarette. Some situations in your life might tempt you into wanting a cigarette.  Never have "just one" cigarette. It leads to wanting another and another. Remind yourself of your decision to quit.  Change habits associated with smoking. If you smoked while driving or when feeling stressed, try other activities to replace smoking. Stand up when drinking your coffee. Brush your teeth after eating. Sit in a different chair when you read the paper. Avoid alcohol while trying to quit, and try to drink fewer caffeinated beverages. Alcohol and caffeine may urge you to smoke.  Avoid foods and drinks that can trigger a desire to smoke, such as sugary or spicy foods and alcohol.  Ask people who smoke not to smoke around you.  Have something planned to do right after eating or having a cup of coffee. For example, plan to take a walk or exercise.  Try a relaxation exercise to calm you down and decrease your stress. Remember, you may be tense and nervous for the first 2 weeks after you quit, but this will pass.  Find new activities to keep your hands busy. Play with a pen, coin, or rubber band. Doodle or draw things on paper.  Brush your teeth right after eating. This will help cut down on the craving for the taste of tobacco after meals. You can also try mouthwash.   Use oral substitutes in place of cigarettes. Try using lemon drops, carrots, cinnamon sticks, or chewing gum. Keep them handy so they are available when you have the urge to smoke.  When you have the urge to smoke, try deep breathing.  Designate your home as a nonsmoking area.  If you are a heavy smoker, ask your health care provider about a prescription for  nicotine chewing gum. It can ease your withdrawal from nicotine.  Reward yourself. Set aside the cigarette money you save and buy yourself something nice.  Look for support from others. Join a support group or smoking cessation program. Ask someone at home or at work   to help you with your plan to quit smoking.  Always ask yourself, "Do I need this cigarette or is this just a reflex?" Tell yourself, "Today, I choose not to smoke," or "I do not want to smoke." You are reminding yourself of your decision to quit.  Do not replace cigarette smoking with electronic cigarettes (commonly called e-cigarettes). The safety of e-cigarettes is unknown, and some may contain harmful chemicals.  If you relapse, do not give up! Plan ahead and think about what you will do the next time you get the urge to smoke. HOW WILL I FEEL WHEN I QUIT SMOKING? You may have symptoms of withdrawal because your body is used to nicotine (the addictive substance in cigarettes). You may crave cigarettes, be irritable, feel very hungry, cough often, get headaches, or have difficulty concentrating. The withdrawal symptoms are only temporary. They are strongest when you first quit but will go away within 10-14 days. When withdrawal symptoms occur, stay in control. Think about your reasons for quitting. Remind yourself that these are signs that your body is healing and getting used to being without cigarettes. Remember that withdrawal symptoms are easier to treat than the major diseases that smoking can cause.  Even after the withdrawal is over, expect periodic urges to smoke. However, these cravings are generally short lived and will go away whether you smoke or not. Do not smoke! WHAT RESOURCES ARE AVAILABLE TO HELP ME QUIT SMOKING? Your health care provider can direct you to community resources or hospitals for support, which may include:  Group support.  Education.  Hypnosis.  Therapy. Document Released: 05/02/2004 Document  Revised: 12/19/2013 Document Reviewed: 01/20/2013 ExitCare Patient Information 2015 [employer]. This information is not intended to replace advice given to you by your health care provider. Make sure you discuss any questions you have with your health care provider.  

## 2014-09-13 ENCOUNTER — Ambulatory Visit (HOSPITAL_BASED_OUTPATIENT_CLINIC_OR_DEPARTMENT_OTHER): Payer: Medicare Other | Admitting: Oncology

## 2014-09-13 ENCOUNTER — Telehealth: Payer: Self-pay | Admitting: Oncology

## 2014-09-13 ENCOUNTER — Other Ambulatory Visit (HOSPITAL_BASED_OUTPATIENT_CLINIC_OR_DEPARTMENT_OTHER): Payer: Medicare Other

## 2014-09-13 VITALS — BP 147/52 | HR 79 | Temp 98.1°F | Resp 18 | Ht 69.0 in | Wt 214.0 lb

## 2014-09-13 DIAGNOSIS — C779 Secondary and unspecified malignant neoplasm of lymph node, unspecified: Secondary | ICD-10-CM

## 2014-09-13 DIAGNOSIS — C7951 Secondary malignant neoplasm of bone: Secondary | ICD-10-CM

## 2014-09-13 DIAGNOSIS — C61 Malignant neoplasm of prostate: Secondary | ICD-10-CM

## 2014-09-13 LAB — COMPREHENSIVE METABOLIC PANEL (CC13)
ALBUMIN: 3.2 g/dL — AB (ref 3.5–5.0)
ANION GAP: 10 meq/L (ref 3–11)
AST: 10 U/L (ref 5–34)
Alkaline Phosphatase: 97 U/L (ref 40–150)
BILIRUBIN TOTAL: 0.55 mg/dL (ref 0.20–1.20)
BUN: 11.4 mg/dL (ref 7.0–26.0)
CO2: 24 meq/L (ref 22–29)
Calcium: 8.7 mg/dL (ref 8.4–10.4)
Chloride: 110 mEq/L — ABNORMAL HIGH (ref 98–109)
Creatinine: 0.9 mg/dL (ref 0.7–1.3)
EGFR: >90 mL/min/{1.73_m2} (ref >90)
GLUCOSE: 127 mg/dL (ref 70–140)
Potassium: 4.1 mEq/L (ref 3.5–5.1)
Sodium: 143 mEq/L (ref 136–145)
Total Protein: 6.6 g/dL (ref 6.4–8.3)

## 2014-09-13 LAB — CBC WITH DIFFERENTIAL/PLATELET
BASO%: 0.4 % (ref 0.0–2.0)
Basophils Absolute: 0 10*3/uL (ref 0.0–0.1)
EOS ABS: 0.2 10*3/uL (ref 0.0–0.5)
EOS%: 2.2 % (ref 0.0–7.0)
HEMATOCRIT: 30.4 % — AB (ref 38.4–49.9)
HEMOGLOBIN: 9.3 g/dL — AB (ref 13.0–17.1)
LYMPH%: 43.3 % (ref 14.0–49.0)
MCH: 26.9 pg — AB (ref 27.2–33.4)
MCHC: 30.6 g/dL — AB (ref 32.0–36.0)
MCV: 87.8 fL (ref 79.3–98.0)
MONO#: 0.7 10*3/uL (ref 0.1–0.9)
MONO%: 9.8 % (ref 0.0–14.0)
NEUT%: 44.3 % (ref 39.0–75.0)
NEUTROS ABS: 3.2 10*3/uL (ref 1.5–6.5)
Platelets: 286 10*3/uL (ref 140–400)
RBC: 3.46 10*6/uL — AB (ref 4.20–5.82)
RDW: 16.9 % — ABNORMAL HIGH (ref 11.0–14.6)
WBC: 7.3 10*3/uL (ref 4.0–10.3)
lymph#: 3.2 10*3/uL (ref 0.9–3.3)

## 2014-09-13 NOTE — Telephone Encounter (Signed)
gv adn printed appt sched and avs fo rpt for [facility]

## 2014-09-13 NOTE — Progress Notes (Signed)
Hematology and Oncology Follow Up Visit  Alec Harris [id number] 20-Dec-1945 69 y.o. 09/13/2014 2:45 PM Alec Harris, MDCloward, Alec Rossetti, MD   Principle Diagnosis: 69 year old with Castration resistant prostate cancer. He was diagnosed in 2003 with Gleason score 4 + 4 = 8. Now he has metastatic disease with involvement of lymph nodes and bone.  Prior Therapy: He was initially diagnosed in 2003 with an elevated PSA. He underwent a prostatectomy with a pathology that showed prostatic adenocarcinoma. Gleason score 4+4 equals 8, with the tumor involving the margins and the pathologic staging was pT4 N1 with 1 of 2 left pelvic lymph nodes involved. He was started on adjuvant hormone therapy with Lupron. He developed a rising PSA and bone metastasis. He was then treated with ketoconazole and prednisone. He developed a rising PSA with a doubling time of just over 6 months. Bone scan in September 2013 showed increased uptake in the right lower lumbar spine at L4 and L5 and abnormal uptake in the mid sternum which had progressed since the prior bone scan.  Current therapy:  Zytiga 1000 mg daily started in October 2013.  He remains on Lupron at [facility].  Interim History: Alec Harris presents today for a follow-up visit by himself. Since his last visit, he continues to do very well. He denied any complications from the [facility]. He denied any chest pain,  nausea or vomiting. He denied any bone pain. He  denied lower extremity edema or back pain. No neurological changes. Denies hematuria or other evidence of bleeding. Has not reported any recent hospitalizations or illnesses. He has not reported any neurological symptoms such as headaches blurred vision or double vision. Has not reported any cough or hemoptysis or hematemesis. Does not report any hematochezia or melena. Does not report any frequency urgency or hesitancy. Does not report any rash or lesions. Does not report any petechiae. Remainder of his  review of systems was unremarkable.  Medications: I have reviewed the patient's current medications. No changes on my review today. Current Outpatient Prescriptions  Medication Sig Dispense Refill  . abiraterone Acetate (ZYTIGA) 250 MG tablet Take 4 tablets (1,000 mg total) by mouth daily. Take on an empty stomach 1 hour before or 2 hours after a meal 120 tablet 0  . amLODipine (NORVASC) 5 MG tablet Take 5 mg by mouth daily.     [name] aspirin EC 81 MG tablet Take 81 mg by mouth daily.    . Calcium Carb-Cholecalciferol (CALCIUM 500 +D) 500-400 MG-UNIT TABS Take 1 tablet by mouth 2 (two) times daily.    . cilostazol (PLETAL) 100 MG tablet Take 1 tablet (100 mg total) by mouth 2 (two) times daily. 60 tablet 0  . clopidogrel (PLAVIX) 75 MG tablet Take 1 tablet (75 mg total) by mouth daily. 30 tablet 6  . CRESTOR 10 MG tablet TAKE 1 TABLET BY MOUTH AT BEDTIME 30 tablet 5   No current facility-administered medications for this visit.     Allergies: No Known Allergies   Physical Exam: Blood pressure 173/47, pulse 83, temperature 98.1 F (36.7 C), temperature source Oral, resp. rate 18, height 5\' 9"  (1.753 m), weight 214 lb (97.07 kg).  ECOG: 1  General appearance: Alert awake without any distress. Head: Normocephalic, without obvious abnormality Neck: no adenopathy, no thyroid masses. Lymph nodes: Cervical, supraclavicular, and axillary nodes normal. Heart: Regular rate and rhythm with a systolic murmur auscultated. Lung:chest clear, no wheezing, rales, normal symmetric air entry, no tachypnea, retractions or cyanosis  Abdomen: soft, non-tender, without masses or organomegaly EXT:no erythema, induration, or nodules. Trace - 1+ edema bilateral lower extremities Neurological examination: No deficits noted.  Lab Results: Lab Results  Component Value Date   WBC 7.3 09/13/2014   HGB 9.3* 09/13/2014   HCT 30.4* 09/13/2014   MCV 87.8 09/13/2014   PLT 286 09/13/2014     Chemistry       Component Value Date/Time   NA 142 08/01/2014 1415   NA 144 07/05/2013 0609   K 4.2 08/01/2014 1415   K 3.7 07/05/2013 0609   CL 106 07/05/2013 0609   CL 106 02/01/2013 1120   CO2 26 08/01/2014 1415   CO2 27 04/09/2012 0500   BUN 13.6 08/01/2014 1415   BUN 11 07/05/2013 0609   CREATININE 1.0 08/01/2014 1415   CREATININE 1.00 07/05/2013 0609      Component Value Date/Time   CALCIUM 8.9 08/01/2014 1415   CALCIUM 8.6 04/09/2012 0500   ALKPHOS 87 08/01/2014 1415   ALKPHOS 96 01/14/2013 0952   AST 11 08/01/2014 1415   AST 15 01/14/2013 0952   ALT 10 08/01/2014 1415   ALT 17 01/14/2013 0952   BILITOT 0.42 08/01/2014 1415   BILITOT 0.7 01/14/2013 0952       Results for Alec Harris (MRN [id number]) as of 09/13/2014 14:39  Ref. Range 12/29/2013 11:37 02/09/2014 13:35 03/23/2014 10:01 05/08/2014 10:35 06/20/2014 14:26 08/01/2014 14:15  PSA Latest Range: <=4.00 ng/mL 0.55 0.60 0.71 0.89 0.94 1.11     Impression and Plan:  This is a 69 year-old gentleman with the following issues:  1. Castration resistant prostate cancer. Currently on Zytiga and tolerating it well. His last PSA was 1.11 and continues to changed very slowly. He continues to tolerate this medication without any complications. He will continue this medication unless his PSA starts rising rapidly. I have discussed with him that eventually will have to switch his therapy to a different salvage regimen but for the time being we are not quite ready to proceed with that. Once his PSA starts to rise rapidly, we will restage him with a bone scan and a CT scan and evaluate that option.  2. Androgen deprivation. He remains on Lupron under the care of Dr. Janice Norrie. Last given in June 2014.  3. Bone directed therapy. He has very minimal bone involvement, but we may consider this down the line. Continue calcium and vitamin D supplements.  4. Followup. In 6 weeks.    Zola Button, MD 1/27/20162:45 PM

## 2014-09-14 ENCOUNTER — Other Ambulatory Visit: Payer: Self-pay | Admitting: *Deleted

## 2014-09-14 ENCOUNTER — Telehealth: Payer: Self-pay | Admitting: *Deleted

## 2014-09-14 DIAGNOSIS — C61 Malignant neoplasm of prostate: Secondary | ICD-10-CM

## 2014-09-14 LAB — PSA: PSA: 1.11 ng/mL (ref <=4.00)

## 2014-09-14 MED ORDER — ABIRATERONE ACETATE 250 MG PO TABS: 1000.0000 mg | ORAL_TABLET | Freq: Every day | ORAL | Status: DC

## 2014-09-14 NOTE — Telephone Encounter (Signed)
No changes at this time. PSA stable.

## 2014-09-14 NOTE — Telephone Encounter (Signed)
PT. WANTS TO KNOW IF THERE ARE ANY CHANGES WITH HIS MEDICATION

## 2014-09-14 NOTE — Telephone Encounter (Signed)
NOTIFIED PT. OF PSA RESULTS AND THAT THERE IS NO CHANGES AT THIS TIME.

## 2014-09-15 ENCOUNTER — Encounter: Payer: Self-pay | Admitting: Cardiovascular Disease

## 2014-09-15 ENCOUNTER — Ambulatory Visit (INDEPENDENT_AMBULATORY_CARE_PROVIDER_SITE_OTHER): Payer: Medicare Other | Admitting: Cardiovascular Disease

## 2014-09-15 VITALS — BP 152/64 | HR 77 | Ht 69.0 in | Wt 214.0 lb

## 2014-09-15 DIAGNOSIS — I6523 Occlusion and stenosis of bilateral carotid arteries: Secondary | ICD-10-CM | POA: Diagnosis not present

## 2014-09-15 DIAGNOSIS — Z79899 Other long term (current) drug therapy: Secondary | ICD-10-CM

## 2014-09-15 DIAGNOSIS — Z720 Tobacco use: Secondary | ICD-10-CM | POA: Diagnosis not present

## 2014-09-15 DIAGNOSIS — D689 Coagulation defect, unspecified: Secondary | ICD-10-CM | POA: Diagnosis not present

## 2014-09-15 DIAGNOSIS — R5383 Other fatigue: Secondary | ICD-10-CM | POA: Diagnosis not present

## 2014-09-15 DIAGNOSIS — I739 Peripheral vascular disease, unspecified: Secondary | ICD-10-CM | POA: Diagnosis not present

## 2014-09-15 DIAGNOSIS — E785 Hyperlipidemia, unspecified: Secondary | ICD-10-CM

## 2014-09-15 DIAGNOSIS — I779 Disorder of arteries and arterioles, unspecified: Secondary | ICD-10-CM | POA: Diagnosis not present

## 2014-09-15 NOTE — Assessment & Plan Note (Signed)
History of carotid disease status post elective right carotid endarterectomy 04/08/12. Dr. Trula Slade and I performed stenting of his left carotid 07/05/13.

## 2014-09-15 NOTE — Patient Instructions (Addendum)
Dr. Gwenlyn Found has ordered a peripheral angiogram to be done at [facility].  This procedure is going to look at the bloodflow in your lower extremities.  If Dr. Gwenlyn Found is able to open up the arteries, you will have to spend one night in the hospital.  If he is not able to open the arteries, you will be able to go home that same day.    After the procedure, you will not be allowed to drive for 3 days or push, pull, or lift anything greater than 10 lbs for one week.    You will be required to have the following tests prior to the procedure:  1. Blood work-the blood work can be done no more than 7 days prior to the procedure.  It can be done at any [facility] lab.  There is one downstairs on the first floor of this building and one in the [place] ([address])  2. Chest Xray-the chest xray order has already been placed at the [facility].     *REPS Eric Bilateral groin stick  lower extremity arterial doppler (to be done before the angiogram)- During this test, ultrasound is used to evaluate arterial blood flow in the legs. Allow approximately one hour for this exam.

## 2014-09-15 NOTE — Assessment & Plan Note (Signed)
History of bilaterally occluded SFAs with high-grade calcified iliac stenosis and severe lifestyle limiting claudication. His last Dopplers performed 01/18/13 revealed ABIs in the 0.5 range bilaterally. Abdomen the plan of performing angiography of the bilateral iliac intervention for improvement of inflow and profunda collateral flow.

## 2014-09-15 NOTE — Progress Notes (Signed)
09/15/2014 Alec Harris   1946-04-10  [id number]  Primary Physician Thurman Coyer, MD Primary Cardiologist: Lorretta Harp MD Renae Gloss   HPI:  The patient is a 69 year old mildly overweight married African American male father of 2, grandfather to 2 grandchildren who I last saw 17 months ago. He was referred to me by Dr. Debara Pickett for PV evaluation. He has severe diffuse vascular disease involving his iliacs, SFAs and carotids. His claudication improved with Pletal. His other problems include hyperlipidemia. He had a negative Myoview Jan 14, 2012. He does continue to smoke. He stopped his Crestor on his own and did have an elevated lipid profile in the past with a total cholesterol of 255, LDL of 169 and HDL of 56. I angiogrammed him on March 02, 2012 revealing total SFAs bilaterally with 3-vessel runoff below the knee, high-grade calcified iliac disease, and bilateral carotid artery stenosis, 95% right and 80% left with a type 2 arch. He underwent elective right carotid endarterectomy by Dr. Annamarie Major on April 08, 2012.over the last 6 months he denies chest pain shortness of breath or claudication. The Pletal has been beneficial. Unfortunately he did not obtain his carotid artery Doppler studies as originally intended. He had followup carotid Dopplers performed in our office on 01/18/13 which revealed a widely patent right carotid endarterectomy site, and progression of disease on the left which had already been documented angiographically to be in the 80% range. He was neurologically asymptomatic. He ultimately underwent elective left internal carotid artery stenting by myself and Dr. Trula Slade 07/05/13. He denies chest pain or shortness of breath but does complain of lifestyle limiting claudication. Lower cineangiography performed 03/02/12 by myself revealed occluded SFAs bilaterally with high-grade calcified right external and left common iliac artery stenosis. His last Dopplers  performed 01/18/13 revealed occluded SFAs with high-grade iliac and common femoral disease.  Current Outpatient Prescriptions  Medication Sig Dispense Refill  . abiraterone Acetate (ZYTIGA) 250 MG tablet Take 4 tablets (1,000 mg total) by mouth daily. Take on an empty stomach 1 hour before or 2 hours after a meal 120 tablet 0  . aspirin EC 81 MG tablet Take 81 mg by mouth daily.    . Calcium Carb-Cholecalciferol (CALCIUM 500 +D) 500-400 MG-UNIT TABS Take 1 tablet by mouth 2 (two) times daily.    . cilostazol (PLETAL) 100 MG tablet Take 1 tablet (100 mg total) by mouth 2 (two) times daily. 60 tablet 0  . clopidogrel (PLAVIX) 75 MG tablet Take 1 tablet (75 mg total) by mouth daily. 30 tablet 6  . CRESTOR 10 MG tablet TAKE 1 TABLET BY MOUTH AT BEDTIME 30 tablet 5  . amLODipine (NORVASC) 5 MG tablet Take 5 mg by mouth daily.      No current facility-administered medications for this visit.    No Known Allergies  History   Social History  . Marital Status: Married    Spouse Name: N/A    Number of Children: N/A  . Years of Education: N/A   Occupational History  . Not on file.   Social History Main Topics  . Smoking status: Light Tobacco Smoker -- 50 years    Types: Cigarettes  . Smokeless tobacco: Never Used     Comment: pt states that he is going to quit completely; 1/2 pack per day  . Alcohol Use: 3.0 oz/week    3 Cans of beer, 2 Shots of liquor per week  . Drug Use: No  . Sexual Activity:  No     Comment: smokes 5 cigarettes per day....   Other Topics Concern  . Not on file   Social History Narrative     Review of Systems: General: negative for chills, fever, night sweats or weight changes.  Cardiovascular: negative for chest pain, dyspnea on exertion, edema, orthopnea, palpitations, paroxysmal nocturnal dyspnea or shortness of breath Dermatological: negative for rash Respiratory: negative for cough or wheezing Urologic: negative for hematuria Abdominal: negative for  nausea, vomiting, diarrhea, bright red blood per rectum, melena, or hematemesis Neurologic: negative for visual changes, syncope, or dizziness All other systems reviewed and are otherwise negative except as noted above.    Blood pressure 152/64, pulse 77, height 5\' 9"  (1.753 m), weight 214 lb (97.07 kg).  General appearance: alert and no distress Neck: no adenopathy, no carotid bruit, no JVD, supple, symmetrical, trachea midline and thyroid not enlarged, symmetric, no tenderness/mass/nodules Lungs: clear to auscultation bilaterally Heart: regular rate and rhythm, S1, S2 normal, no murmur, click, rub or gallop Extremities: extremities normal, atraumatic, no cyanosis or edema and bilateral femoral bruits, absent pedal pulses  EKG normal sinus rhythm at 77 without ST or T-wave changes. I personally reviewed this EKG  ASSESSMENT AND PLAN:   PVD (peripheral vascular disease) with claudication History of bilaterally occluded SFAs with high-grade calcified iliac stenosis and severe lifestyle limiting claudication. His last Dopplers performed 01/18/13 revealed ABIs in the 0.5 range bilaterally. Abdomen the plan of performing angiography of the bilateral iliac intervention for improvement of inflow and profunda collateral flow.   Hyperlipemia History of hyperlipidemia on Crestor followed by his PCP   Carotid disease, bilateral History of carotid disease status post elective right carotid endarterectomy 04/08/12. Dr. Trula Slade and I performed stenting of his left carotid 07/05/13.       Lorretta Harp MD FACP,FACC,FAHA, [facility] 09/15/2014 5:51 PM

## 2014-09-15 NOTE — Assessment & Plan Note (Signed)
History of hyperlipidemia on Crestor followed by his PCP 

## 2014-09-18 ENCOUNTER — Other Ambulatory Visit (HOSPITAL_COMMUNITY): Payer: Medicare Other

## 2014-09-21 ENCOUNTER — Other Ambulatory Visit: Payer: Self-pay | Admitting: *Deleted

## 2014-09-21 DIAGNOSIS — R0989 Other specified symptoms and signs involving the circulatory and respiratory systems: Secondary | ICD-10-CM

## 2014-09-25 ENCOUNTER — Ambulatory Visit (HOSPITAL_COMMUNITY)
Admission: RE | Admit: 2014-09-25 | Discharge: 2014-09-25 | Disposition: A | Discharge status: Home / Self Care | Payer: Medicare Other | Source: Ambulatory Visit | Attending: Surgery | Admitting: Surgery

## 2014-09-25 DIAGNOSIS — R0989 Other specified symptoms and signs involving the circulatory and respiratory systems: Secondary | ICD-10-CM | POA: Diagnosis present

## 2014-09-28 ENCOUNTER — Ambulatory Visit (HOSPITAL_COMMUNITY)
Admission: RE | Admit: 2014-09-28 | Discharge: 2014-09-28 | Disposition: A | Discharge status: Home / Self Care | Payer: Medicare Other | Source: Ambulatory Visit | Attending: Internal Medicine | Admitting: Internal Medicine

## 2014-09-28 DIAGNOSIS — I739 Peripheral vascular disease, unspecified: Secondary | ICD-10-CM | POA: Insufficient documentation

## 2014-09-28 NOTE — Progress Notes (Signed)
Arterial Duplex Lower Ext. Completed. Mohan Erven, BS, RDMS, RVT  

## 2014-10-04 ENCOUNTER — Encounter: Payer: Self-pay | Admitting: *Deleted

## 2014-10-11 ENCOUNTER — Other Ambulatory Visit: Payer: Self-pay | Admitting: Cardiovascular Disease

## 2014-10-11 NOTE — Telephone Encounter (Signed)
Rx(s) sent to pharmacy electronically.  

## 2014-10-13 ENCOUNTER — Other Ambulatory Visit: Payer: Self-pay | Admitting: *Deleted

## 2014-10-13 DIAGNOSIS — C61 Malignant neoplasm of prostate: Secondary | ICD-10-CM

## 2014-10-13 MED ORDER — ABIRATERONE ACETATE 250 MG PO TABS: 1000.0000 mg | ORAL_TABLET | Freq: Every day | ORAL | Status: DC

## 2014-10-25 ENCOUNTER — Telehealth: Payer: Self-pay | Admitting: Oncology

## 2014-10-25 ENCOUNTER — Encounter: Payer: Self-pay | Admitting: Physician Assistant

## 2014-10-25 ENCOUNTER — Ambulatory Visit (HOSPITAL_BASED_OUTPATIENT_CLINIC_OR_DEPARTMENT_OTHER): Payer: Medicare Other | Admitting: Physician Assistant

## 2014-10-25 ENCOUNTER — Other Ambulatory Visit (HOSPITAL_BASED_OUTPATIENT_CLINIC_OR_DEPARTMENT_OTHER): Payer: Medicare Other

## 2014-10-25 VITALS — BP 158/58 | HR 79 | Temp 99.3°F | Resp 18 | Ht 69.0 in | Wt 214.3 lb

## 2014-10-25 DIAGNOSIS — C7951 Secondary malignant neoplasm of bone: Secondary | ICD-10-CM | POA: Diagnosis not present

## 2014-10-25 DIAGNOSIS — D649 Anemia, unspecified: Secondary | ICD-10-CM

## 2014-10-25 DIAGNOSIS — E291 Testicular hypofunction: Secondary | ICD-10-CM

## 2014-10-25 DIAGNOSIS — C61 Malignant neoplasm of prostate: Secondary | ICD-10-CM

## 2014-10-25 LAB — CBC WITH DIFFERENTIAL/PLATELET
BASO%: 0.4 % (ref 0.0–2.0)
Basophils Absolute: 0 10*3/uL (ref 0.0–0.1)
EOS%: 1.4 % (ref 0.0–7.0)
Eosinophils Absolute: 0.1 10*3/uL (ref 0.0–0.5)
HCT: 28.3 % — ABNORMAL LOW (ref 38.4–49.9)
HGB: 8.8 g/dL — ABNORMAL LOW (ref 13.0–17.1)
LYMPH%: 42.7 % (ref 14.0–49.0)
MCH: 25.6 pg — ABNORMAL LOW (ref 27.2–33.4)
MCHC: 31.1 g/dL — AB (ref 32.0–36.0)
MCV: 82.3 fL (ref 79.3–98.0)
MONO#: 0.8 10*3/uL (ref 0.1–0.9)
MONO%: 9.7 % (ref 0.0–14.0)
NEUT%: 45.8 % (ref 39.0–75.0)
NEUTROS ABS: 3.7 10*3/uL (ref 1.5–6.5)
Platelets: 256 10*3/uL (ref 140–400)
RBC: 3.44 10*6/uL — AB (ref 4.20–5.82)
RDW: 17.4 % — AB (ref 11.0–14.6)
WBC: 8.1 10*3/uL (ref 4.0–10.3)
lymph#: 3.5 10*3/uL — ABNORMAL HIGH (ref 0.9–3.3)

## 2014-10-25 LAB — COMPREHENSIVE METABOLIC PANEL (CC13)
ALK PHOS: 80 U/L (ref 40–150)
ALT: 6 U/L (ref 0–55)
ANION GAP: 9 meq/L (ref 3–11)
AST: 10 U/L (ref 5–34)
Albumin: 3.1 g/dL — ABNORMAL LOW (ref 3.5–5.0)
BUN: 14 mg/dL (ref 7.0–26.0)
CALCIUM: 8.6 mg/dL (ref 8.4–10.4)
CO2: 23 mEq/L (ref 22–29)
CREATININE: 0.9 mg/dL (ref 0.7–1.3)
Chloride: 110 mEq/L — ABNORMAL HIGH (ref 98–109)
EGFR: >90 mL/min/{1.73_m2} (ref >90)
Glucose: 108 mg/dl (ref 70–140)
Potassium: 3.9 mEq/L (ref 3.5–5.1)
Sodium: 142 mEq/L (ref 136–145)
TOTAL PROTEIN: 6.4 g/dL (ref 6.4–8.3)
Total Bilirubin: 0.43 mg/dL (ref 0.20–1.20)

## 2014-10-25 NOTE — Telephone Encounter (Signed)
gv adn printed appt sched and avs for pt for April  °

## 2014-10-25 NOTE — Progress Notes (Signed)
Hematology and Oncology Follow Up Visit  Alec Harris [id number] 06/13/1946 69 y.o. 10/25/2014 3:26 PM Harris,Alec L, MDCloward, Alec Rossetti, MD   Principle Diagnosis: 69 year old with Castration resistant prostate cancer. He was diagnosed in 2003 with Gleason score 4 + 4 = 8. Now he has metastatic disease with involvement of lymph nodes and bone.  Prior Therapy: He was initially diagnosed in 2003 with an elevated PSA. He underwent a prostatectomy with a pathology that showed prostatic adenocarcinoma. Gleason score 4+4 equals 8, with the tumor involving the margins and the pathologic staging was pT4 N1 with 1 of 2 left pelvic lymph nodes involved. He was started on adjuvant hormone therapy with Lupron. He developed a rising PSA and bone metastasis. He was then treated with ketoconazole and prednisone. He developed a rising PSA with a doubling time of just over 6 months. Bone scan in September 2013 showed increased uptake in the right lower lumbar spine at L4 and L5 and abnormal uptake in the mid sternum which had progressed since the prior bone scan.  Current therapy:  Zytiga 1000 mg daily started in October 2013.  He remains on Lupron at [facility] urology.  Interim History: [name] presents today for a follow-up visit unaccompanied. Since his last visit, he continues to do very well. He denied any complications from the [facility]. He denied any chest pain,  nausea or vomiting. He denied any bone pain. He  denied lower extremity edema or back pain. No neurological changes. Denies hematuria or other evidence of bleeding. Has not reported any recent hospitalizations or illnesses. He has not reported any neurological symptoms such as headaches blurred vision or double vision. Has not reported any cough or hemoptysis or hematemesis. Does not report any hematochezia or melena. Does not report any frequency urgency or hesitancy. Does not report any rash or lesions. Does not report any petechiae. Remainder of his  review of systems was unremarkable.  Medications: I have reviewed the patient's current medications. No changes on my review today. Current Outpatient Prescriptions  Medication Sig Dispense Refill  . abiraterone Acetate (ZYTIGA) 250 MG tablet Take 4 tablets (1,000 mg total) by mouth daily. Take on an empty stomach 1 hour before or 2 hours after a meal 120 tablet 0  . amLODipine (NORVASC) 5 MG tablet   1  . aspirin EC 81 MG tablet Take 81 mg by mouth daily.    . Calcium Carb-Cholecalciferol (CALCIUM 500 +D) 500-400 MG-UNIT TABS Take 1 tablet by mouth 2 (two) times daily.    . cilostazol (PLETAL) 100 MG tablet TAKE 1 TABLET BY MOUTH TWICE A DAY 60 tablet 11  . clopidogrel (PLAVIX) 75 MG tablet Take 1 tablet (75 mg total) by mouth daily. 30 tablet 6  . CRESTOR 10 MG tablet TAKE 1 TABLET BY MOUTH AT BEDTIME 30 tablet 5  . amLODipine (NORVASC) 5 MG tablet Take 5 mg by mouth daily.      No current facility-administered medications for this visit.     Allergies: No Known Allergies   Physical Exam: Blood pressure 158/58, pulse 79, temperature 99.3 F (37.4 C), temperature source Oral, resp. rate 18, height 5\' 9"  (1.753 m), weight 214 lb 4.8 oz (97.206 kg).  ECOG: 1  General appearance: Alert awake without any distress. Head: Normocephalic, without obvious abnormality Neck: no adenopathy, no thyroid masses. Lymph nodes: Cervical, supraclavicular, and axillary nodes normal. Heart: Regular rate and rhythm with a systolic murmur auscultated. Lung:chest clear, no wheezing, rales, normal symmetric  air entry, no tachypnea, retractions or cyanosis Abdomen: soft, non-tender, without masses or organomegaly EXT:no erythema, induration, or nodules. Trace - 1+ edema bilateral lower extremities Neurological examination: No deficits noted.  Lab Results: Lab Results  Component Value Date   WBC 8.1 10/25/2014   HGB 8.8* 10/25/2014   HCT 28.3* 10/25/2014   MCV 82.3 10/25/2014   PLT 256 10/25/2014      Chemistry      Component Value Date/Time   NA 143 09/13/2014 1424   NA 144 07/05/2013 0609   K 4.1 09/13/2014 1424   K 3.7 07/05/2013 0609   CL 106 07/05/2013 0609   CL 106 02/01/2013 1120   CO2 24 09/13/2014 1424   CO2 27 04/09/2012 0500   BUN 11.4 09/13/2014 1424   BUN 11 07/05/2013 0609   CREATININE 0.9 09/13/2014 1424   CREATININE 1.00 07/05/2013 0609      Component Value Date/Time   CALCIUM 8.7 09/13/2014 1424   CALCIUM 8.6 04/09/2012 0500   ALKPHOS 97 09/13/2014 1424   ALKPHOS 96 01/14/2013 0952   AST 10 09/13/2014 1424   AST 15 01/14/2013 0952   ALT <6 09/13/2014 1424   ALT 17 01/14/2013 0952   BILITOT 0.55 09/13/2014 1424   BILITOT 0.7 01/14/2013 0952       Results for [name], [name] (MRN [id number]) as of 09/13/2014 14:39  Ref. Range 12/29/2013 11:37 02/09/2014 13:35 03/23/2014 10:01 05/08/2014 10:35 06/20/2014 14:26 08/01/2014 14:15  PSA Latest Range: <=4.00 ng/mL 0.55 0.60 0.71 0.89 0.94 1.11     Impression and Plan:  This is a 69 year-old gentleman with the following issues:  1. Castration resistant prostate cancer. Currently on Zytiga and tolerating it well. His last PSA was 1.11 and continues to changed very slowly. He continues to tolerate this medication without any complications. He will continue this medication unless his PSA starts rising rapidly.W will eventually will have to switch his therapy to a different salvage regimen but for the time being we are not quite ready to proceed with that. Once his PSA starts to rise rapidly, we will restage him with a bone scan and a CT scan and evaluate that option.  2. Androgen deprivation. He remains on Lupron under the care of Dr. Janice Norrie. Last given in June 2014.  3. Bone directed therapy. He has very minimal bone involvement, but we may consider this down the line. Continue calcium and vitamin D supplements.  4. Anemia: Likely chemotherapy/disease related. His hemoglobin is gradually drifting downward, today's  result 8.8 g/dL. He is currently asymptomatic. We will continue to monitor this closely. He may require packed red blood cell transfusions if his hemoglobin falls below 8 or he becomes symptomatic.  5. Followup. In 6 weeks.    [name], PA-C  3/9/20163:26 PM

## 2014-10-26 LAB — PSA: PSA: 1.21 ng/mL (ref <=4.00)

## 2014-10-27 NOTE — Patient Instructions (Signed)
Continue Zytiga at the current dose Follow-up in a proximally 6 weeks

## 2014-11-09 DIAGNOSIS — C61 Malignant neoplasm of prostate: Secondary | ICD-10-CM | POA: Diagnosis not present

## 2014-11-14 ENCOUNTER — Other Ambulatory Visit: Payer: Self-pay | Admitting: *Deleted

## 2014-11-14 DIAGNOSIS — C61 Malignant neoplasm of prostate: Secondary | ICD-10-CM

## 2014-11-14 MED ORDER — ABIRATERONE ACETATE 250 MG PO TABS: 1000.0000 mg | ORAL_TABLET | Freq: Every day | ORAL | Status: DC

## 2014-12-06 ENCOUNTER — Telehealth: Payer: Self-pay | Admitting: Oncology

## 2014-12-06 ENCOUNTER — Other Ambulatory Visit (HOSPITAL_BASED_OUTPATIENT_CLINIC_OR_DEPARTMENT_OTHER): Payer: Medicare Other

## 2014-12-06 ENCOUNTER — Ambulatory Visit (HOSPITAL_BASED_OUTPATIENT_CLINIC_OR_DEPARTMENT_OTHER): Payer: Medicare Other | Admitting: Oncology

## 2014-12-06 VITALS — BP 177/59 | HR 77 | Temp 98.9°F | Resp 18 | Ht 69.0 in | Wt 212.2 lb

## 2014-12-06 DIAGNOSIS — D649 Anemia, unspecified: Secondary | ICD-10-CM

## 2014-12-06 DIAGNOSIS — C61 Malignant neoplasm of prostate: Secondary | ICD-10-CM | POA: Diagnosis not present

## 2014-12-06 DIAGNOSIS — C7951 Secondary malignant neoplasm of bone: Secondary | ICD-10-CM

## 2014-12-06 DIAGNOSIS — C779 Secondary and unspecified malignant neoplasm of lymph node, unspecified: Secondary | ICD-10-CM

## 2014-12-06 LAB — CBC WITH DIFFERENTIAL/PLATELET
BASO%: 0.3 % (ref 0.0–2.0)
BASOS ABS: 0 10*3/uL (ref 0.0–0.1)
EOS ABS: 0.2 10*3/uL (ref 0.0–0.5)
EOS%: 2.1 % (ref 0.0–7.0)
HCT: 29.3 % — ABNORMAL LOW (ref 38.4–49.9)
HEMOGLOBIN: 9.1 g/dL — AB (ref 13.0–17.1)
LYMPH%: 45.4 % (ref 14.0–49.0)
MCH: 24.6 pg — AB (ref 27.2–33.4)
MCHC: 31.1 g/dL — ABNORMAL LOW (ref 32.0–36.0)
MCV: 79.2 fL — ABNORMAL LOW (ref 79.3–98.0)
MONO#: 0.7 10*3/uL (ref 0.1–0.9)
MONO%: 9 % (ref 0.0–14.0)
NEUT%: 43.2 % (ref 39.0–75.0)
NEUTROS ABS: 3.3 10*3/uL (ref 1.5–6.5)
PLATELETS: 226 10*3/uL (ref 140–400)
RBC: 3.7 10*6/uL — ABNORMAL LOW (ref 4.20–5.82)
RDW: 18.7 % — ABNORMAL HIGH (ref 11.0–14.6)
WBC: 7.6 10*3/uL (ref 4.0–10.3)
lymph#: 3.4 10*3/uL — ABNORMAL HIGH (ref 0.9–3.3)

## 2014-12-06 LAB — COMPREHENSIVE METABOLIC PANEL (CC13)
ALK PHOS: 103 U/L (ref 40–150)
ALT: 6 U/L (ref 0–55)
AST: 10 U/L (ref 5–34)
Albumin: 3.3 g/dL — ABNORMAL LOW (ref 3.5–5.0)
Anion Gap: 12 mEq/L — ABNORMAL HIGH (ref 3–11)
BUN: 12.2 mg/dL (ref 7.0–26.0)
CO2: 22 mEq/L (ref 22–29)
Calcium: 8.4 mg/dL (ref 8.4–10.4)
Chloride: 109 mEq/L (ref 98–109)
Creatinine: 0.9 mg/dL (ref 0.7–1.3)
EGFR: >90 mL/min/{1.73_m2} (ref >90)
GLUCOSE: 107 mg/dL (ref 70–140)
Potassium: 3.7 mEq/L (ref 3.5–5.1)
SODIUM: 143 meq/L (ref 136–145)
TOTAL PROTEIN: 6.5 g/dL (ref 6.4–8.3)
Total Bilirubin: 0.33 mg/dL (ref 0.20–1.20)

## 2014-12-06 NOTE — Progress Notes (Signed)
Hematology and Oncology Follow Up Visit  Alec Harris [id number] 04-30-1946 69 y.o. 12/06/2014 3:51 PM [name],DAVIS L, MDCloward, [name], MD   Principle Diagnosis: 69 year old with Castration resistant prostate cancer. He was diagnosed in 2003 with Gleason score 4 + 4 = 8. Now he has metastatic disease with involvement of lymph nodes and bone.  Prior Therapy: He was initially diagnosed in 2003 with an elevated PSA. He underwent a prostatectomy with a pathology that showed prostatic adenocarcinoma. Gleason score 4+4 equals 8, with the tumor involving the margins and the pathologic staging was pT4 N1 with 1 of 2 left pelvic lymph nodes involved. He was started on adjuvant hormone therapy with Lupron. He developed a rising PSA and bone metastasis. He was then treated with ketoconazole and prednisone. He developed a rising PSA with a doubling time of just over 6 months. Bone scan in September 2013 showed increased uptake in the right lower lumbar spine at L4 and L5 and abnormal uptake in the mid sternum which had progressed since the prior bone scan.  Current therapy:  Zytiga 1000 mg daily started in October 2013.  He remains on Lupron at [facility] urology.  Interim History: Alec Harris presents today for a follow-up visit by him self. Since his last visit, he reports no new issues. He denied any complications from the [facility]. He did report an episode of constipation which have resolved now. His appetite have been excellent and performance status or pain at baseline. He denied any chest pain,  nausea or vomiting. He denied any bone pain. He  denied lower extremity edema or back pain. No neurological changes. Denies hematuria or other evidence of bleeding. Has not reported any recent hospitalizations or illnesses. He has not reported any neurological symptoms such as headaches blurred vision or double vision. Has not reported any cough or hemoptysis or hematemesis. Does not report any hematochezia or melena.  Does not report any frequency urgency or hesitancy. Does not report any rash or lesions. Does not report any petechiae. Remainder of his review of systems was unremarkable.  Medications: I have reviewed the patient's current medications. No changes on my review today. Current Outpatient Prescriptions  Medication Sig Dispense Refill  . abiraterone Acetate (ZYTIGA) 250 MG tablet Take 4 tablets (1,000 mg total) by mouth daily. Take on an empty stomach 1 hour before or 2 hours after a meal 120 tablet 0  . amLODipine (NORVASC) 5 MG tablet Take 5 mg by mouth daily.     [name] amLODipine (NORVASC) 5 MG tablet   1  . aspirin EC 81 MG tablet Take 81 mg by mouth daily.    . Calcium Carb-Cholecalciferol (CALCIUM 500 +D) 500-400 MG-UNIT TABS Take 1 tablet by mouth 2 (two) times daily.    . cilostazol (PLETAL) 100 MG tablet TAKE 1 TABLET BY MOUTH TWICE A DAY 60 tablet 11  . clopidogrel (PLAVIX) 75 MG tablet Take 1 tablet (75 mg total) by mouth daily. 30 tablet 6  . CRESTOR 10 MG tablet TAKE 1 TABLET BY MOUTH AT BEDTIME 30 tablet 5   No current facility-administered medications for this visit.     Allergies: No Known Allergies   Physical Exam: Blood pressure 177/59, pulse 77, temperature 98.9 F (37.2 C), temperature source Oral, resp. rate 18, height 5\' 9"  (1.753 m), weight 212 lb 3.2 oz (96.253 kg).  ECOG: 1  General appearance: Alert awake without any distress. Head: Normocephalic, without obvious abnormality Neck: no adenopathy, no thyroid masses. Lymph  nodes: Cervical, supraclavicular, and axillary nodes normal. Heart: Regular rate and rhythm with a systolic murmur auscultated. Lung:chest clear, no wheezing, rales, normal symmetric air entry.  Abdomen: soft, non-tender, without masses or organomegaly CBU:LAGTX - 1+ edema bilateral lower extremities Neurological examination: No deficits noted.  Lab Results: Lab Results  Component Value Date   WBC 7.6 12/06/2014   HGB 9.1* 12/06/2014   HCT  29.3* 12/06/2014   MCV 79.2* 12/06/2014   PLT 226 12/06/2014     Chemistry      Component Value Date/Time   NA 142 10/25/2014 1439   NA 144 07/05/2013 0609   K 3.9 10/25/2014 1439   K 3.7 07/05/2013 0609   CL 106 07/05/2013 0609   CL 106 02/01/2013 1120   CO2 23 10/25/2014 1439   CO2 27 04/09/2012 0500   BUN 14.0 10/25/2014 1439   BUN 11 07/05/2013 0609   CREATININE 0.9 10/25/2014 1439   CREATININE 1.00 07/05/2013 0609      Component Value Date/Time   CALCIUM 8.6 10/25/2014 1439   CALCIUM 8.6 04/09/2012 0500   ALKPHOS 80 10/25/2014 1439   ALKPHOS 96 01/14/2013 0952   AST 10 10/25/2014 1439   AST 15 01/14/2013 0952   ALT 6 10/25/2014 1439   ALT 17 01/14/2013 0952   BILITOT 0.43 10/25/2014 1439   BILITOT 0.7 01/14/2013 0952      Results for Alec, Harris (MRN [id number]) as of 12/06/2014 15:35  Ref. Range 03/23/2014 10:01 05/08/2014 10:35 06/20/2014 14:26 08/01/2014 14:15 09/13/2014 14:23 10/25/2014 14:38  PSA Latest Ref Range: <=4.00 ng/mL 0.71 0.89 0.94 1.11 1.11 1.21     Impression and Plan:  This is a 69 year-old gentleman with the following issues:  1. Castration resistant prostate cancer. Currently on Zytiga and tolerating it well. His last PSA is slightly up to 1.21 and continues to be under control. He continues to tolerate this medication without any complications.  The plan is to continue with the same dose and schedule of this medication and switch to a different regimen if he develops a rapid rise in his PSA.  2. Androgen deprivation. He remains on Lupron under the care of Dr. Janice Norrie. Last given in June 2014.  3. Bone directed therapy. He has very minimal bone involvement, but we may consider this down the line. Continue calcium and vitamin D supplements.  4. Anemia: Likely chemotherapy/disease related. His hemoglobin is relatively stable at this time. I see no need for any growth factor support but we will continue to monitor this.   5. Followup. In 6  weeks.    Zola Button, MD 4/20/20163:51 PM

## 2014-12-06 NOTE — Telephone Encounter (Signed)
Gave avs & calendar for June. °

## 2014-12-07 LAB — PSA: PSA: 1.44 ng/mL (ref <=4.00)

## 2014-12-13 ENCOUNTER — Other Ambulatory Visit: Payer: Self-pay | Admitting: *Deleted

## 2014-12-13 DIAGNOSIS — C61 Malignant neoplasm of prostate: Secondary | ICD-10-CM

## 2014-12-13 MED ORDER — ABIRATERONE ACETATE 250 MG PO TABS: 1000.0000 mg | ORAL_TABLET | Freq: Every day | ORAL | Status: DC

## 2015-01-11 ENCOUNTER — Other Ambulatory Visit: Payer: Self-pay | Admitting: *Deleted

## 2015-01-11 DIAGNOSIS — C61 Malignant neoplasm of prostate: Secondary | ICD-10-CM

## 2015-01-11 MED ORDER — ABIRATERONE ACETATE 250 MG PO TABS: 1000.0000 mg | ORAL_TABLET | Freq: Every day | ORAL | Status: DC

## 2015-01-24 ENCOUNTER — Other Ambulatory Visit (HOSPITAL_BASED_OUTPATIENT_CLINIC_OR_DEPARTMENT_OTHER): Payer: Medicare Other

## 2015-01-24 ENCOUNTER — Encounter: Payer: Self-pay | Admitting: Physician Assistant

## 2015-01-24 ENCOUNTER — Telehealth: Payer: Self-pay | Admitting: Oncology

## 2015-01-24 ENCOUNTER — Ambulatory Visit (HOSPITAL_BASED_OUTPATIENT_CLINIC_OR_DEPARTMENT_OTHER): Payer: Medicare Other | Admitting: Physician Assistant

## 2015-01-24 VITALS — BP 158/60 | HR 84 | Temp 98.7°F | Resp 18 | Ht 69.0 in | Wt 210.7 lb

## 2015-01-24 DIAGNOSIS — C61 Malignant neoplasm of prostate: Secondary | ICD-10-CM | POA: Diagnosis not present

## 2015-01-24 DIAGNOSIS — D649 Anemia, unspecified: Secondary | ICD-10-CM | POA: Diagnosis not present

## 2015-01-24 DIAGNOSIS — C7951 Secondary malignant neoplasm of bone: Secondary | ICD-10-CM

## 2015-01-24 DIAGNOSIS — C779 Secondary and unspecified malignant neoplasm of lymph node, unspecified: Secondary | ICD-10-CM | POA: Diagnosis not present

## 2015-01-24 LAB — CBC WITH DIFFERENTIAL/PLATELET
BASO%: 0.7 % (ref 0.0–2.0)
Basophils Absolute: 0 10*3/uL (ref 0.0–0.1)
EOS%: 2.3 % (ref 0.0–7.0)
Eosinophils Absolute: 0.2 10*3/uL (ref 0.0–0.5)
HCT: 30.2 % — ABNORMAL LOW (ref 38.4–49.9)
HEMOGLOBIN: 9.6 g/dL — AB (ref 13.0–17.1)
LYMPH%: 40.7 % (ref 14.0–49.0)
MCH: 24.5 pg — AB (ref 27.2–33.4)
MCHC: 31.7 g/dL — ABNORMAL LOW (ref 32.0–36.0)
MCV: 77.3 fL — ABNORMAL LOW (ref 79.3–98.0)
MONO#: 0.7 10*3/uL (ref 0.1–0.9)
MONO%: 9.9 % (ref 0.0–14.0)
NEUT#: 3.1 10*3/uL (ref 1.5–6.5)
NEUT%: 46.4 % (ref 39.0–75.0)
Platelets: 275 10*3/uL (ref 140–400)
RBC: 3.91 10*6/uL — AB (ref 4.20–5.82)
RDW: 21.7 % — ABNORMAL HIGH (ref 11.0–14.6)
WBC: 6.6 10*3/uL (ref 4.0–10.3)
lymph#: 2.7 10*3/uL (ref 0.9–3.3)

## 2015-01-24 LAB — COMPREHENSIVE METABOLIC PANEL (CC13)
ALT: <6 U/L (ref 0–55)
AST: 10 U/L (ref 5–34)
Albumin: 3 g/dL — ABNORMAL LOW (ref 3.5–5.0)
Alkaline Phosphatase: 91 U/L (ref 40–150)
Anion Gap: 6 mEq/L (ref 3–11)
BUN: 11.4 mg/dL (ref 7.0–26.0)
CALCIUM: 8.4 mg/dL (ref 8.4–10.4)
CHLORIDE: 111 meq/L — AB (ref 98–109)
CO2: 24 meq/L (ref 22–29)
Creatinine: 0.8 mg/dL (ref 0.7–1.3)
GLUCOSE: 126 mg/dL (ref 70–140)
Potassium: 3.5 mEq/L (ref 3.5–5.1)
SODIUM: 141 meq/L (ref 136–145)
TOTAL PROTEIN: 6.4 g/dL (ref 6.4–8.3)
Total Bilirubin: 0.53 mg/dL (ref 0.20–1.20)

## 2015-01-24 NOTE — Patient Instructions (Signed)
Continue Zytiga at the current dose Follow-up in 6 weeks

## 2015-01-24 NOTE — Telephone Encounter (Signed)
Gave adn printed appt sched and avs for pt for July  °

## 2015-01-24 NOTE — Progress Notes (Signed)
Hematology and Oncology Follow Up Visit  Kyandre Okray [id number] [birth year] 69 y.o. 01/24/2015 3:42 PM [name],DAVIS L, MDCloward, [name], MD   Principle Diagnosis: 69 year old with Castration resistant prostate cancer. He was diagnosed in 2003 with Gleason score 4 + 4 = 8. Now he has metastatic disease with involvement of lymph nodes and bone.  Prior Therapy: He was initially diagnosed in 2003 with an elevated PSA. He underwent a prostatectomy with a pathology that showed prostatic adenocarcinoma. Gleason score 4+4 equals 8, with the tumor involving the margins and the pathologic staging was pT4 N1 with 1 of 2 left pelvic lymph nodes involved. He was started on adjuvant hormone therapy with Lupron. He developed a rising PSA and bone metastasis. He was then treated with ketoconazole and prednisone. He developed a rising PSA with a doubling time of just over 6 months. Bone scan in September 2013 showed increased uptake in the right lower lumbar spine at L4 and L5 and abnormal uptake in the mid sternum which had progressed since the prior bone scan.  Current therapy:  Zytiga 1000 mg daily started in October 2013.  He remains on Lupron at [facility] urology.  Interim History: Mr. Mennenga presents today for a follow-up visit by him self. Since his last visit, he reports no new issues. He denied any complications from the [facility]. He did report an episode of constipation which have resolved now. His appetite have been excellent and performance status or pain at baseline. He denied any chest pain,  nausea or vomiting. He denied any bone pain. He  denied lower extremity edema or back pain. No neurological changes. Denies hematuria or other evidence of bleeding. Has not reported any recent hospitalizations or illnesses. He has not reported any neurological symptoms such as headaches blurred vision or double vision. Has not reported any cough or hemoptysis or hematemesis. Does not report any hematochezia or melena.  He does report occasional constipation but is managing this with dietary measures. Does not report any frequency urgency or hesitancy. Does not report any rash or lesions. Does not report any petechiae. Remainder of his review of systems was unremarkable.  Medications: I have reviewed the patient's current medications. No changes on my review today. Current Outpatient Prescriptions  Medication Sig Dispense Refill  . abiraterone Acetate (ZYTIGA) 250 MG tablet Take 4 tablets (1,000 mg total) by mouth daily. Take on an empty stomach 1 hour before or 2 hours after a meal 120 tablet 0  . amLODipine (NORVASC) 5 MG tablet Take 5 mg by mouth daily.     [name] amLODipine (NORVASC) 5 MG tablet   1  . aspirin EC 81 MG tablet Take 81 mg by mouth daily.    . Calcium Carb-Cholecalciferol (CALCIUM 500 +D) 500-400 MG-UNIT TABS Take 1 tablet by mouth 2 (two) times daily.    . cilostazol (PLETAL) 100 MG tablet TAKE 1 TABLET BY MOUTH TWICE A DAY 60 tablet 11  . clopidogrel (PLAVIX) 75 MG tablet Take 1 tablet (75 mg total) by mouth daily. 30 tablet 6  . CRESTOR 10 MG tablet TAKE 1 TABLET BY MOUTH AT BEDTIME 30 tablet 5   No current facility-administered medications for this visit.     Allergies: No Known Allergies   Physical Exam: Blood pressure 158/60, pulse 84, temperature 98.7 F (37.1 C), temperature source Oral, resp. rate 18, height 5\' 9"  (1.753 m), weight 210 lb 11.2 oz (95.573 kg), SpO2 100 %.  ECOG: 1  General appearance: Alert awake  without any distress. Head: Normocephalic, without obvious abnormality Neck: no adenopathy, no thyroid masses. Lymph nodes: Cervical, supraclavicular, and axillary nodes normal. Heart: Regular rate and rhythm with a systolic murmur auscultated. Lung:chest clear, no wheezing, rales, normal symmetric air entry.  Abdomen: soft, non-tender, without masses or organomegaly OHY:WVPXT - 1+ edema bilateral lower extremities Neurological examination: No deficits noted.  Lab  Results: Lab Results  Component Value Date   WBC 6.6 01/24/2015   HGB 9.6* 01/24/2015   HCT 30.2* 01/24/2015   MCV 77.3* 01/24/2015   PLT 275 01/24/2015     Chemistry      Component Value Date/Time   NA 141 01/24/2015 1358   NA 144 07/05/2013 0609   K 3.5 01/24/2015 1358   K 3.7 07/05/2013 0609   CL 106 07/05/2013 0609   CL 106 02/01/2013 1120   CO2 24 01/24/2015 1358   CO2 27 04/09/2012 0500   BUN 11.4 01/24/2015 1358   BUN 11 07/05/2013 0609   CREATININE 0.8 01/24/2015 1358   CREATININE 1.00 07/05/2013 0609      Component Value Date/Time   CALCIUM 8.4 01/24/2015 1358   CALCIUM 8.6 04/09/2012 0500   ALKPHOS 91 01/24/2015 1358   ALKPHOS 96 01/14/2013 0952   AST 10 01/24/2015 1358   AST 15 01/14/2013 0952   ALT <6 01/24/2015 1358   ALT 17 01/14/2013 0952   BILITOT 0.53 01/24/2015 1358   BILITOT 0.7 01/14/2013 0952      Results for DON, TIU (MRN [id number]) as of 12/06/2014 15:35  Ref. Range 03/23/2014 10:01 05/08/2014 10:35 06/20/2014 14:26 08/01/2014 14:15 09/13/2014 14:23 10/25/2014 14:38  PSA Latest Ref Range: <=4.00 ng/mL 0.71 0.89 0.94 1.11 1.11 1.21     Impression and Plan:  This is a 69 year-old gentleman with the following issues:  1. Castration resistant prostate cancer. Currently on Zytiga and tolerating it well. His last PSA is slightly up to 1.21 and continues to be under control. He continues to tolerate this medication without any complications.  The plan is to continue with the same dose and schedule of this medication and switch to a different regimen if he develops a rapid rise in his PSA.  2. Androgen deprivation. He remains on Lupron under the care of Dr. Janice Norrie. Last given in June 2014.  3. Bone directed therapy. He has very minimal bone involvement, but we may consider this down the line. Continue calcium and vitamin D supplements.  4. Anemia: Likely chemotherapy/disease related. His hemoglobin is relatively stable at this time. No need for any  growth factor support but we will continue to monitor this.   5. Followup. In 6 weeks.    [name], [place]  6/8/20163:42 PM

## 2015-01-25 DIAGNOSIS — C61 Malignant neoplasm of prostate: Secondary | ICD-10-CM | POA: Diagnosis not present

## 2015-01-26 LAB — PSA: PSA: 1.72 ng/mL (ref <=4.00)

## 2015-02-09 ENCOUNTER — Other Ambulatory Visit: Payer: Self-pay | Admitting: *Deleted

## 2015-02-09 DIAGNOSIS — C61 Malignant neoplasm of prostate: Secondary | ICD-10-CM

## 2015-02-09 MED ORDER — ABIRATERONE ACETATE 250 MG PO TABS: 1000.0000 mg | ORAL_TABLET | Freq: Every day | ORAL | Status: DC

## 2015-02-20 ENCOUNTER — Encounter: Payer: Self-pay | Admitting: Cardiology

## 2015-02-28 DIAGNOSIS — I10 Essential (primary) hypertension: Secondary | ICD-10-CM | POA: Diagnosis not present

## 2015-03-08 ENCOUNTER — Telehealth: Payer: Self-pay | Admitting: Oncology

## 2015-03-08 ENCOUNTER — Ambulatory Visit (HOSPITAL_BASED_OUTPATIENT_CLINIC_OR_DEPARTMENT_OTHER): Payer: Medicare Other | Admitting: Oncology

## 2015-03-08 ENCOUNTER — Other Ambulatory Visit (HOSPITAL_BASED_OUTPATIENT_CLINIC_OR_DEPARTMENT_OTHER): Payer: Medicare Other

## 2015-03-08 VITALS — BP 161/68 | HR 83 | Temp 98.5°F | Resp 18 | Ht 69.0 in | Wt 210.1 lb

## 2015-03-08 DIAGNOSIS — I6523 Occlusion and stenosis of bilateral carotid arteries: Secondary | ICD-10-CM

## 2015-03-08 DIAGNOSIS — C61 Malignant neoplasm of prostate: Secondary | ICD-10-CM

## 2015-03-08 LAB — CBC WITH DIFFERENTIAL/PLATELET
BASO%: 0.6 % (ref 0.0–2.0)
BASOS ABS: 0 10*3/uL (ref 0.0–0.1)
EOS%: 2.2 % (ref 0.0–7.0)
Eosinophils Absolute: 0.1 10*3/uL (ref 0.0–0.5)
HCT: 33.2 % — ABNORMAL LOW (ref 38.4–49.9)
HGB: 10.7 g/dL — ABNORMAL LOW (ref 13.0–17.1)
LYMPH#: 2.9 10*3/uL (ref 0.9–3.3)
LYMPH%: 44.1 % (ref 14.0–49.0)
MCH: 25.1 pg — ABNORMAL LOW (ref 27.2–33.4)
MCHC: 32.2 g/dL (ref 32.0–36.0)
MCV: 78.1 fL — ABNORMAL LOW (ref 79.3–98.0)
MONO#: 0.6 10*3/uL (ref 0.1–0.9)
MONO%: 9.7 % (ref 0.0–14.0)
NEUT#: 2.8 10*3/uL (ref 1.5–6.5)
NEUT%: 43.4 % (ref 39.0–75.0)
Platelets: 245 10*3/uL (ref 140–400)
RBC: 4.25 10*6/uL (ref 4.20–5.82)
RDW: 20.3 % — ABNORMAL HIGH (ref 11.0–14.6)
WBC: 6.5 10*3/uL (ref 4.0–10.3)

## 2015-03-08 LAB — COMPREHENSIVE METABOLIC PANEL (CC13)
ALBUMIN: 3.2 g/dL — AB (ref 3.5–5.0)
ALT: 7 U/L (ref 0–55)
AST: 10 U/L (ref 5–34)
Alkaline Phosphatase: 89 U/L (ref 40–150)
Anion Gap: 5 mEq/L (ref 3–11)
BUN: 11.4 mg/dL (ref 7.0–26.0)
CO2: 25 meq/L (ref 22–29)
CREATININE: 0.9 mg/dL (ref 0.7–1.3)
Calcium: 8.7 mg/dL (ref 8.4–10.4)
Chloride: 111 mEq/L — ABNORMAL HIGH (ref 98–109)
Glucose: 108 mg/dl (ref 70–140)
Potassium: 3.8 mEq/L (ref 3.5–5.1)
Sodium: 142 mEq/L (ref 136–145)
TOTAL PROTEIN: 6.6 g/dL (ref 6.4–8.3)
Total Bilirubin: 0.6 mg/dL (ref 0.20–1.20)

## 2015-03-08 NOTE — Telephone Encounter (Signed)
Gave patient avs report and appointments for September.  °

## 2015-03-08 NOTE — Progress Notes (Signed)
Hematology and Oncology Follow Up Visit  Alec Harris [id number] 08-01-1946 69 y.o. 03/08/2015 3:17 PM Alec Harris, MDCloward, Alec Rossetti, MD   Principle Diagnosis: 69 year old with Castration resistant prostate cancer. He was diagnosed in 2003 with Gleason score 4 + 4 = 8. Now he has metastatic disease with involvement of lymph nodes and bone.  Prior Therapy: He was initially diagnosed in 2003 with an elevated PSA. He underwent a prostatectomy with a pathology that showed prostatic adenocarcinoma. Gleason score 4+4 equals 8, with the tumor involving the margins and the pathologic staging was pT4 N1 with 1 of 2 left pelvic lymph nodes involved. He was started on adjuvant hormone therapy with Lupron. He developed a rising PSA and bone metastasis. He was then treated with ketoconazole and prednisone. He developed a rising PSA with a doubling time of just over 6 months. Bone scan in September 2013 showed increased uptake in the right lower lumbar spine at L4 and L5 and abnormal uptake in the mid sternum which had progressed since the prior bone scan.  Current therapy:  Zytiga 1000 mg daily started in October 2013.  He remains on Lupron at [facility] urology.  Interim History: Alec Harris presents today for a follow-up visit. Since his last visit, he continues to do well. He denied any new complications from the [facility]. He did report occasional episodes of constipation which are easily manageable. His appetite have been excellent and performance status or pain at baseline.   He denied any chest pain,  nausea or vomiting. He denied any bone pain. He  denied lower extremity edema or back pain. No neurological changes. Denies hematuria or other evidence of bleeding. Has not reported any recent hospitalizations or illnesses. He has not reported any neurological symptoms such as headaches blurred vision or double vision. Has not reported any cough or hemoptysis or hematemesis. Does not report any hematochezia or  melena. He does report occasional constipation but is managing this with dietary measures. Does not report any frequency urgency or hesitancy. Does not report any rash or lesions. Does not report any petechiae. Remainder of his review of systems was unremarkable.  Medications: I have reviewed the patient's current medications. No changes on my review today. Current Outpatient Prescriptions  Medication Sig Dispense Refill  . abiraterone Acetate (ZYTIGA) 250 MG tablet Take 4 tablets (1,000 mg total) by mouth daily. Take on an empty stomach 1 hour before or 2 hours after a meal 120 tablet 0  . amLODipine (NORVASC) 5 MG tablet   1  . aspirin EC 81 MG tablet Take 81 mg by mouth daily.    . Calcium Carb-Cholecalciferol (CALCIUM 500 +D) 500-400 MG-UNIT TABS Take 1 tablet by mouth 2 (two) times daily.    . cilostazol (PLETAL) 100 MG tablet TAKE 1 TABLET BY MOUTH TWICE A DAY 60 tablet 11  . clopidogrel (PLAVIX) 75 MG tablet Take 1 tablet (75 mg total) by mouth daily. 30 tablet 6  . CRESTOR 10 MG tablet TAKE 1 TABLET BY MOUTH AT BEDTIME 30 tablet 5   No current facility-administered medications for this visit.     Allergies: No Known Allergies   Physical Exam: His blood pressure was 161/68 pulse 83 RR 18 temperature 98.5 ECOG: 1 General appearance: Alert awake appeared comfortable. Head: Normocephalic, without obvious abnormality Neck: no adenopathy, no thyroid masses. Lymph nodes: Cervical, supraclavicular, and axillary nodes normal. Heart: Regular rate and rhythm with a systolic murmur auscultated. Lung:chest clear, no wheezing, rales, normal symmetric  air entry. No dullness to percussion. Abdomen: soft, non-tender, without masses or organomegaly KCM:KLKJZ - 1+ edema bilateral lower extremities Neurological examination: No deficits noted.  Lab Results: Lab Results  Component Value Date   WBC 6.5 03/08/2015   HGB 10.7* 03/08/2015   HCT 33.2* 03/08/2015   MCV 78.1* 03/08/2015   PLT 245  03/08/2015     Chemistry      Component Value Date/Time   NA 141 01/24/2015 1358   NA 144 07/05/2013 0609   K 3.5 01/24/2015 1358   K 3.7 07/05/2013 0609   CL 106 07/05/2013 0609   CL 106 02/01/2013 1120   CO2 24 01/24/2015 1358   CO2 27 04/09/2012 0500   BUN 11.4 01/24/2015 1358   BUN 11 07/05/2013 0609   CREATININE 0.8 01/24/2015 1358   CREATININE 1.00 07/05/2013 0609      Component Value Date/Time   CALCIUM 8.4 01/24/2015 1358   CALCIUM 8.6 04/09/2012 0500   ALKPHOS 91 01/24/2015 1358   ALKPHOS 96 01/14/2013 0952   AST 10 01/24/2015 1358   AST 15 01/14/2013 0952   ALT <6 01/24/2015 1358   ALT 17 01/14/2013 0952   BILITOT 0.53 01/24/2015 1358   BILITOT 0.7 01/14/2013 0952        Results for Alec Harris (MRN [id number]) as of 03/08/2015 15:09  Ref. Range 10/25/2014 14:38 12/06/2014 15:19 01/25/2015 13:29  PSA Latest Ref Range: <=4.00 ng/mL 1.21 1.44 1.72    Impression and Plan:  This is a 69 year-old gentleman with the following issues:  1. Castration resistant prostate cancer. Currently on Zytiga and tolerating it well. His PSA have been slowly rising with a rather slow doubling time. The plan is to continue with the same dose and schedule of this medication and switch to a different regimen if he develops a rapid rise in his PSA.  2. Androgen deprivation. He remains on Lupron under the care of Dr. Janice Harris. Last given in June 2014.  3. Bone directed therapy. He has very minimal bone involvement, but we may consider this down the line. Continue calcium and vitamin D supplements.  4. Anemia: Likely chemotherapy/disease related. His hemoglobin is relatively stable at this time. No need for any growth factor support but we will continue to monitor this.   5. Followup. In 8 weeks.    [facility], MD 7/21/20163:17 PM

## 2015-03-09 ENCOUNTER — Other Ambulatory Visit: Payer: Self-pay | Admitting: *Deleted

## 2015-03-09 DIAGNOSIS — C61 Malignant neoplasm of prostate: Secondary | ICD-10-CM

## 2015-03-09 LAB — PSA: PSA: 2.1 ng/mL (ref <=4.00)

## 2015-03-09 MED ORDER — ABIRATERONE ACETATE 250 MG PO TABS: 1000.0000 mg | ORAL_TABLET | Freq: Every day | ORAL | Status: DC

## 2015-03-15 DIAGNOSIS — C61 Malignant neoplasm of prostate: Secondary | ICD-10-CM | POA: Diagnosis not present

## 2015-04-06 ENCOUNTER — Other Ambulatory Visit: Payer: Self-pay | Admitting: *Deleted

## 2015-04-06 DIAGNOSIS — C61 Malignant neoplasm of prostate: Secondary | ICD-10-CM

## 2015-04-06 MED ORDER — ABIRATERONE ACETATE 250 MG PO TABS: 1000.0000 mg | ORAL_TABLET | Freq: Every day | ORAL | Status: DC

## 2015-04-27 ENCOUNTER — Other Ambulatory Visit: Payer: Self-pay | Admitting: Surgery

## 2015-05-08 ENCOUNTER — Other Ambulatory Visit: Payer: Self-pay | Admitting: *Deleted

## 2015-05-08 DIAGNOSIS — C61 Malignant neoplasm of prostate: Secondary | ICD-10-CM

## 2015-05-08 MED ORDER — ABIRATERONE ACETATE 250 MG PO TABS: 1000.0000 mg | ORAL_TABLET | Freq: Every day | ORAL | Status: DC

## 2015-05-09 ENCOUNTER — Other Ambulatory Visit (HOSPITAL_BASED_OUTPATIENT_CLINIC_OR_DEPARTMENT_OTHER): Payer: Medicare Other

## 2015-05-09 ENCOUNTER — Ambulatory Visit (HOSPITAL_BASED_OUTPATIENT_CLINIC_OR_DEPARTMENT_OTHER): Payer: Medicare Other | Admitting: Oncology

## 2015-05-09 ENCOUNTER — Telehealth: Payer: Self-pay | Admitting: Oncology

## 2015-05-09 VITALS — BP 161/60 | HR 79 | Temp 98.5°F | Resp 18 | Ht 69.0 in | Wt 210.7 lb

## 2015-05-09 DIAGNOSIS — I6523 Occlusion and stenosis of bilateral carotid arteries: Secondary | ICD-10-CM

## 2015-05-09 DIAGNOSIS — C61 Malignant neoplasm of prostate: Secondary | ICD-10-CM | POA: Diagnosis not present

## 2015-05-09 LAB — COMPREHENSIVE METABOLIC PANEL (CC13)
ALK PHOS: 87 U/L (ref 40–150)
ANION GAP: 7 meq/L (ref 3–11)
AST: 10 U/L (ref 5–34)
Albumin: 3.2 g/dL — ABNORMAL LOW (ref 3.5–5.0)
BUN: 16 mg/dL (ref 7.0–26.0)
CHLORIDE: 109 meq/L (ref 98–109)
CO2: 25 mEq/L (ref 22–29)
CREATININE: 0.9 mg/dL (ref 0.7–1.3)
Calcium: 8.9 mg/dL (ref 8.4–10.4)
EGFR: >90 mL/min/{1.73_m2} (ref >90)
Glucose: 114 mg/dl (ref 70–140)
Potassium: 3.9 mEq/L (ref 3.5–5.1)
Sodium: 141 mEq/L (ref 136–145)
Total Bilirubin: 0.54 mg/dL (ref 0.20–1.20)
Total Protein: 6.7 g/dL (ref 6.4–8.3)

## 2015-05-09 LAB — CBC WITH DIFFERENTIAL/PLATELET
BASO%: 0.4 % (ref 0.0–2.0)
Basophils Absolute: 0 10*3/uL (ref 0.0–0.1)
EOS ABS: 0.2 10*3/uL (ref 0.0–0.5)
EOS%: 2.4 % (ref 0.0–7.0)
HEMATOCRIT: 36.9 % — AB (ref 38.4–49.9)
HEMOGLOBIN: 12.3 g/dL — AB (ref 13.0–17.1)
LYMPH#: 3.2 10*3/uL (ref 0.9–3.3)
LYMPH%: 44.5 % (ref 14.0–49.0)
MCH: 27.3 pg (ref 27.2–33.4)
MCHC: 33.3 g/dL (ref 32.0–36.0)
MCV: 82 fL (ref 79.3–98.0)
MONO#: 0.7 10*3/uL (ref 0.1–0.9)
MONO%: 9.9 % (ref 0.0–14.0)
NEUT%: 42.8 % (ref 39.0–75.0)
NEUTROS ABS: 3 10*3/uL (ref 1.5–6.5)
PLATELETS: 204 10*3/uL (ref 140–400)
RBC: 4.5 10*6/uL (ref 4.20–5.82)
RDW: 19.7 % — ABNORMAL HIGH (ref 11.0–14.6)
WBC: 7.1 10*3/uL (ref 4.0–10.3)
nRBC: 0 % (ref 0–0)

## 2015-05-09 NOTE — Telephone Encounter (Signed)
Pt confirmed labs/ov per 09/21 POF, gave pt AVS and Calendar... KJ °

## 2015-05-09 NOTE — Progress Notes (Signed)
Hematology and Oncology Follow Up Visit  Alec Harris [id number] April 14, 1946 69 y.o. 05/09/2015 4:17 PM [name],DAVIS L, MDCloward, Alec Rossetti, MD   Principle Diagnosis: 69 year old with Castration resistant prostate cancer. He was diagnosed in 2003 with Gleason score 4 + 4 = 8. Now he has metastatic disease with involvement of lymph nodes and bone.  Prior Therapy: He was initially diagnosed in 2003 with an elevated PSA. He underwent a prostatectomy with a pathology that showed prostatic adenocarcinoma. Gleason score 4+4 equals 8, with the tumor involving the margins and the pathologic staging was pT4 N1 with 1 of 2 left pelvic lymph nodes involved. He was started on adjuvant hormone therapy with Lupron. He developed a rising PSA and bone metastasis. He was then treated with ketoconazole and prednisone. He developed a rising PSA with a doubling time of just over 6 months. Bone scan in September 2013 showed increased uptake in the right lower lumbar spine at L4 and L5 and abnormal uptake in the mid sternum which had progressed since the prior bone scan.  Current therapy:  Zytiga 1000 mg daily started in October 2013.  He remains on Lupron at [facility] urology.  Interim History: Alec Harris presents today for a follow-up visit. Since his last visit, he reports no complications.  He continues to take Zytiga and have not reported any new side effects. He does not report any lower extremity edema or GI complications. He did report occasional episodes of constipation which are easily manageable. His appetite have been excellent and performance status or pain at baseline.  He has not reported any bleeding episodes.  He has not reported any neurological symptoms such as headaches blurred vision or double vision. He denied any chest pain, palpitation or leg edema. He reports no nausea or vomiting. Denies hematuria or other evidence of bleeding.  Has not reported any cough or hemoptysis or hematemesis. Does not  report any hematochezia or melena. Does not report any frequency urgency or hesitancy. Does not report any rash or lesions. Does not report any petechiae. Remainder of his review of systems was unremarkable.  Medications: I have reviewed the patient's current medications. No changes on my review today. Current Outpatient Prescriptions  Medication Sig Dispense Refill  . abiraterone Acetate (ZYTIGA) 250 MG tablet Take 4 tablets (1,000 mg total) by mouth daily. Take on an empty stomach 1 hour before or 2 hours after a meal 120 tablet 0  . amLODipine (NORVASC) 5 MG tablet   1  . aspirin EC 81 MG tablet Take 81 mg by mouth daily.    . Calcium Carb-Cholecalciferol (CALCIUM 500 +D) 500-400 MG-UNIT TABS Take 1 tablet by mouth 2 (two) times daily.    . cilostazol (PLETAL) 100 MG tablet TAKE 1 TABLET BY MOUTH TWICE A DAY 60 tablet 11  . clopidogrel (PLAVIX) 75 MG tablet TAKE 1 TABLET BY MOUTH DAILY 30 tablet 6  . CRESTOR 10 MG tablet TAKE 1 TABLET BY MOUTH AT BEDTIME 30 tablet 5   No current facility-administered medications for this visit.     Allergies: No Known Allergies   Physical Exam: Blood pressure 161/60, pulse 79, temperature 98.5 F (36.9 C), temperature source Oral, resp. rate 18, height 5\' 9"  (1.753 m), weight 210 lb 11.2 oz (95.573 kg), SpO2 95 %.  ECOG: 1 General appearance: Alert awake appeared comfortable. Not in any distress.  Head: Normocephalic, without obvious abnormality oropharynx without any ulcers or lesions. Neck: no adenopathy, no thyroid masses. Lymph nodes: Cervical,  supraclavicular, and axillary nodes normal. Heart: Regular rate and rhythm with a systolic murmur auscultated. Lung:chest clear, no wheezing, rales, normal symmetric air entry. No dullness to percussion. Abdomen: soft, non-tender, without masses or organomegaly no shifting dullness or ascites. ZDG:UYQIH - 1+ edema bilateral lower extremities Neurological examination: No deficits noted.  Lab  Results: Lab Results  Component Value Date   WBC 7.1 05/09/2015   HGB 12.3* 05/09/2015   HCT 36.9* 05/09/2015   MCV 82.0 05/09/2015   PLT 204 05/09/2015     Chemistry      Component Value Date/Time   NA 142 03/08/2015 1458   NA 144 07/05/2013 0609   K 3.8 03/08/2015 1458   K 3.7 07/05/2013 0609   CL 106 07/05/2013 0609   CL 106 02/01/2013 1120   CO2 25 03/08/2015 1458   CO2 27 04/09/2012 0500   BUN 11.4 03/08/2015 1458   BUN 11 07/05/2013 0609   CREATININE 0.9 03/08/2015 1458   CREATININE 1.00 07/05/2013 0609      Component Value Date/Time   CALCIUM 8.7 03/08/2015 1458   CALCIUM 8.6 04/09/2012 0500   ALKPHOS 89 03/08/2015 1458   ALKPHOS 96 01/14/2013 0952   AST 10 03/08/2015 1458   AST 15 01/14/2013 0952   ALT 7 03/08/2015 1458   ALT 17 01/14/2013 0952   BILITOT 0.60 03/08/2015 1458   BILITOT 0.7 01/14/2013 0952       Results for Alec Harris, Alec Harris (MRN [id number]) as of 05/09/2015 15:40  Ref. Range 01/25/2015 13:29 03/08/2015 14:58  PSA Latest Ref Range: <=4.00 ng/mL 1.72 2.10      Impression and Plan:  This is a 69 year-old gentleman with the following issues:  1. Castration resistant prostate cancer with metastatic disease to the bone. Currently on Zytiga for the last 3 yearsand tolerating it well.   He had an excellent initial response with a PSA nadir close to 0.42. His most recent PSA was up to 2.10 with a doubling time of close to 12 months. He is asymptomatic from his disease I'm tolerate this medication well. I plan on continuing on the same dose and schedule and monitor his PSA closely. He develops symptomatic progression from his disease, we will consider different salvage therapy at that time.  2. Androgen deprivation. He remains on Lupron under the care of Dr. Janice Norrie. Last given in June 2014. This needs to be continued indefinitely.  3. Bone directed therapy. He has very minimal bone involvement, but we may consider this down the line. Continue calcium and  vitamin D supplements. No major changes at this time.  4. Anemia: Likely chemotherapy/disease related. His hemoglobin is relatively stable at this time. No need for any intervention at this time.  5. Followup. In 8 weeks.    Zola Button, MD 9/21/20164:17 PM

## 2015-05-10 LAB — PSA: PSA: 2.72 ng/mL (ref <=4.00)

## 2015-05-16 ENCOUNTER — Encounter: Payer: Self-pay | Admitting: Oncology

## 2015-05-16 NOTE — Progress Notes (Signed)
Per [employer] zytiga was shipped via [employer]

## 2015-06-13 ENCOUNTER — Other Ambulatory Visit: Payer: Self-pay | Admitting: *Deleted

## 2015-06-13 ENCOUNTER — Other Ambulatory Visit: Payer: Self-pay

## 2015-06-13 DIAGNOSIS — C61 Malignant neoplasm of prostate: Secondary | ICD-10-CM

## 2015-06-13 MED ORDER — CILOSTAZOL 100 MG PO TABS: 100.0000 mg | ORAL_TABLET | Freq: Two times a day (BID) | ORAL | Status: DC

## 2015-06-13 MED ORDER — ABIRATERONE ACETATE 250 MG PO TABS
1000.0000 mg | ORAL_TABLET | Freq: Every day | ORAL | Status: DC
Start: 2015-06-13 — End: 2015-07-10

## 2015-06-19 ENCOUNTER — Encounter: Payer: Self-pay | Admitting: Oncology

## 2015-06-19 NOTE — Progress Notes (Signed)
Per [employer] zytiga was shipped via [employer]

## 2015-06-20 ENCOUNTER — Encounter: Payer: Self-pay | Admitting: Oncology

## 2015-06-20 NOTE — Progress Notes (Signed)
Per [employer] zytiga was shipped via [employer]

## 2015-07-07 ENCOUNTER — Other Ambulatory Visit: Payer: Self-pay | Admitting: Cardiovascular Disease

## 2015-07-10 ENCOUNTER — Other Ambulatory Visit: Payer: Self-pay | Admitting: *Deleted

## 2015-07-10 DIAGNOSIS — C61 Malignant neoplasm of prostate: Secondary | ICD-10-CM

## 2015-07-10 MED ORDER — ABIRATERONE ACETATE 250 MG PO TABS
1000.0000 mg | ORAL_TABLET | Freq: Every day | ORAL | Status: DC
Start: 2015-07-10 — End: 2015-08-14

## 2015-07-11 ENCOUNTER — Other Ambulatory Visit (HOSPITAL_BASED_OUTPATIENT_CLINIC_OR_DEPARTMENT_OTHER): Payer: Medicare Other

## 2015-07-11 ENCOUNTER — Telehealth: Payer: Self-pay | Admitting: Oncology

## 2015-07-11 ENCOUNTER — Ambulatory Visit (HOSPITAL_BASED_OUTPATIENT_CLINIC_OR_DEPARTMENT_OTHER): Payer: Medicare Other | Admitting: Oncology

## 2015-07-11 VITALS — BP 154/62 | HR 83 | Temp 98.0°F | Resp 18 | Ht 69.0 in | Wt 212.9 lb

## 2015-07-11 DIAGNOSIS — C7951 Secondary malignant neoplasm of bone: Secondary | ICD-10-CM

## 2015-07-11 DIAGNOSIS — C779 Secondary and unspecified malignant neoplasm of lymph node, unspecified: Secondary | ICD-10-CM

## 2015-07-11 DIAGNOSIS — C61 Malignant neoplasm of prostate: Secondary | ICD-10-CM

## 2015-07-11 DIAGNOSIS — I6523 Occlusion and stenosis of bilateral carotid arteries: Secondary | ICD-10-CM | POA: Diagnosis not present

## 2015-07-11 LAB — CBC WITH DIFFERENTIAL/PLATELET
BASO%: 0.8 % (ref 0.0–2.0)
Basophils Absolute: 0.1 10*3/uL (ref 0.0–0.1)
EOS%: 1.6 % (ref 0.0–7.0)
Eosinophils Absolute: 0.1 10*3/uL (ref 0.0–0.5)
HCT: 39.7 % (ref 38.4–49.9)
HGB: 12.8 g/dL — ABNORMAL LOW (ref 13.0–17.1)
LYMPH%: 46.3 % (ref 14.0–49.0)
MCH: 28.6 pg (ref 27.2–33.4)
MCHC: 32.3 g/dL (ref 32.0–36.0)
MCV: 88.6 fL (ref 79.3–98.0)
MONO#: 0.7 10*3/uL (ref 0.1–0.9)
MONO%: 9.4 % (ref 0.0–14.0)
NEUT#: 3 10*3/uL (ref 1.5–6.5)
NEUT%: 41.9 % (ref 39.0–75.0)
PLATELETS: 209 10*3/uL (ref 140–400)
RBC: 4.48 10*6/uL (ref 4.20–5.82)
RDW: 18.5 % — ABNORMAL HIGH (ref 11.0–14.6)
WBC: 7.2 10*3/uL (ref 4.0–10.3)
lymph#: 3.4 10*3/uL — ABNORMAL HIGH (ref 0.9–3.3)

## 2015-07-11 LAB — COMPREHENSIVE METABOLIC PANEL (CC13)
ALBUMIN: 3.2 g/dL — AB (ref 3.5–5.0)
ALK PHOS: 80 U/L (ref 40–150)
AST: 10 U/L (ref 5–34)
Anion Gap: 6 mEq/L (ref 3–11)
BUN: 12.7 mg/dL (ref 7.0–26.0)
CALCIUM: 9 mg/dL (ref 8.4–10.4)
CO2: 26 mEq/L (ref 22–29)
CREATININE: 1 mg/dL (ref 0.7–1.3)
Chloride: 109 mEq/L (ref 98–109)
EGFR: >90 mL/min/{1.73_m2} (ref >90)
GLUCOSE: 121 mg/dL (ref 70–140)
POTASSIUM: 3.7 meq/L (ref 3.5–5.1)
SODIUM: 141 meq/L (ref 136–145)
Total Bilirubin: 0.59 mg/dL (ref 0.20–1.20)
Total Protein: 6.6 g/dL (ref 6.4–8.3)

## 2015-07-11 NOTE — Progress Notes (Signed)
Hematology and Oncology Follow Up Visit  Alec Begay [id number] Mar 26, 1946 69 y.o. 07/11/2015 3:42 PM Alec Harris, MDCloward, [name], MD   Principle Diagnosis: 69 year old with Castration resistant prostate cancer. He was diagnosed in 2003 with Gleason score 4 + 4 = 8. Now he has metastatic disease with involvement of lymph nodes and bone.  Prior Therapy:   He underwent a prostatectomy with a pathology that showed prostatic adenocarcinoma. Gleason score 4+4 equals 8, with the tumor involving the margins and the pathologic staging was pT4 N1 with 1 of 2 left pelvic lymph nodes involved.  He was started on adjuvant hormone therapy with Lupron. He developed a rising PSA and bone metastasis.  He was then treated with ketoconazole and prednisone. He developed a rising PSA with a doubling time of just over 6 months. Bone scan in September 2013 showed increased uptake in the right lower lumbar spine at L4 and L5 and abnormal uptake in the mid sternum which had progressed since the prior bone scan.  Current therapy:  Zytiga 1000 mg daily started in October 2013.  Lupron given at at [facility] urology.  Interim History: Alec Harris presents today for a follow-up visit. Since his last visit, he continues to do well without any recent issues. He continues to take Zytiga and have not reported any new side effects. He does not report any lower extremity edema or GI complications.  His appetite have been excellent and performance status or pain at baseline. He continues to live independently and attends to his activities of daily living. Has not reported any recent neurological symptoms such as peripheral neuropathy or weakness.  He denies any headaches blurred vision or double vision. He denied any chest pain, palpitation or leg edema. He reports no nausea or vomiting. Denies hematuria or other evidence of bleeding.  Has not reported any cough or hemoptysis or hematemesis. Does not report any hematochezia  or melena. Does not report any frequency urgency or hesitancy. Does not report any rash or lesions. Does not report any petechiae. Remainder of his review of systems was unremarkable.  Medications: I have reviewed the patient's current medications. No changes on my review today. Current Outpatient Prescriptions  Medication Sig Dispense Refill  . abiraterone Acetate (ZYTIGA) 250 MG tablet Take 4 tablets (1,000 mg total) by mouth daily. Take on an empty stomach 1 hour before or 2 hours after a meal 120 tablet 0  . amLODipine (NORVASC) 5 MG tablet   1  . aspirin EC 81 MG tablet Take 81 mg by mouth daily.    . Calcium Carb-Cholecalciferol (CALCIUM 500 +D) 500-400 MG-UNIT TABS Take 1 tablet by mouth 2 (two) times daily.    . cilostazol (PLETAL) 100 MG tablet Take 1 tablet (100 mg total) by mouth 2 (two) times daily. 60 tablet 3  . clopidogrel (PLAVIX) 75 MG tablet TAKE 1 TABLET BY MOUTH DAILY 30 tablet 6  . CRESTOR 10 MG tablet TAKE 1 TABLET BY MOUTH DAILY 30 tablet 1   No current facility-administered medications for this visit.     Allergies: No Known Allergies   Physical Exam: Blood pressure 154/62, pulse 83, temperature 98 F (36.7 C), temperature source Oral, resp. rate 18, height 5\' 9"  (1.753 m), weight 212 lb 14.4 oz (96.571 kg), SpO2 100 %.  ECOG: 1 General appearance: Not in any distress.  Head: Normocephalic, without obvious abnormality no oral ulcers or lesions. Neck: no adenopathy, no thyroid masses. Lymph nodes: Cervical, supraclavicular, and axillary  nodes normal. Heart: Regular rate and rhythm with a systolic murmur auscultated. Lung:chest clear, no wheezing, rales, normal symmetric air entry.  Abdomen: soft, non-tender, without masses or organomegaly no rebound or guarding. [id number] - 1+ edema bilateral lower extremities Neurological examination: No deficits noted.  Lab Results: Lab Results  Component Value Date   WBC 7.2 07/11/2015   HGB 12.8* 07/11/2015   HCT  39.7 07/11/2015   MCV 88.6 07/11/2015   PLT 209 07/11/2015     Chemistry      Component Value Date/Time   NA 141 05/09/2015 1525   NA 144 07/05/2013 0609   K 3.9 05/09/2015 1525   K 3.7 07/05/2013 0609   CL 106 07/05/2013 0609   CL 106 02/01/2013 1120   CO2 25 05/09/2015 1525   CO2 27 04/09/2012 0500   BUN 16.0 05/09/2015 1525   BUN 11 07/05/2013 0609   CREATININE 0.9 05/09/2015 1525   CREATININE 1.00 07/05/2013 0609      Component Value Date/Time   CALCIUM 8.9 05/09/2015 1525   CALCIUM 8.6 04/09/2012 0500   ALKPHOS 87 05/09/2015 1525   ALKPHOS 96 01/14/2013 0952   AST 10 05/09/2015 1525   AST 15 01/14/2013 0952   ALT <9 05/09/2015 1525   ALT 17 01/14/2013 0952   BILITOT 0.54 05/09/2015 1525   BILITOT 0.7 01/14/2013 0952       Results for Alec Harris, Alec Harris (MRN [id number]) as of 07/11/2015 15:34  Ref. Range 03/08/2015 14:58 05/09/2015 15:28  PSA Latest Ref Range: <=4.00 ng/mL 2.10 2.72       Impression and Plan:  This is a 69 year-old gentleman with the following issues:  1. Castration resistant prostate cancer with metastatic disease to the bone. He is status post multiple therapies in the past and most recently have been on Zytiga for the last 3 years.  He continues to tolerate Zytiga without any major complications. His PSA had initially excellent response but most recently slowly rising up to 2.72 with a doubling time close to 6 months. The plan is to restage him with a repeat PSA and a bone scan before the next visit. If he has clear progression of disease, different salvage agent will be used at that time. If his disease remains relatively stable, I'm inclined to keep him on this current medication.  2. Androgen deprivation. He remains on Lupron given at [facility] urology. This needs to be continued indefinitely.  3. Bone directed therapy. Continue calcium and vitamin D supplements. We'll consider bone directed therapy if he has significant progression of his  disease.  4. Anemia: Likely chemotherapy/disease related. His hemoglobin is relatively stable at this time. No need for any intervention at this time.  5. Followup. In 8 weeks.    Zola Button, MD 11/23/20163:42 PM

## 2015-07-11 NOTE — Telephone Encounter (Signed)
Gave and printed appt sched and avs for pt for jan 2017 °

## 2015-07-12 LAB — PSA: PSA: 3.76 ng/mL (ref <=4.00)

## 2015-07-23 DIAGNOSIS — C7951 Secondary malignant neoplasm of bone: Secondary | ICD-10-CM | POA: Diagnosis not present

## 2015-07-23 DIAGNOSIS — C61 Malignant neoplasm of prostate: Secondary | ICD-10-CM | POA: Diagnosis not present

## 2015-08-14 ENCOUNTER — Other Ambulatory Visit: Payer: Self-pay | Admitting: *Deleted

## 2015-08-14 DIAGNOSIS — C61 Malignant neoplasm of prostate: Secondary | ICD-10-CM

## 2015-08-14 MED ORDER — ABIRATERONE ACETATE 250 MG PO TABS: 1000.0000 mg | ORAL_TABLET | Freq: Every day | ORAL | Status: DC

## 2015-08-16 ENCOUNTER — Encounter: Payer: Self-pay | Admitting: Oncology

## 2015-08-16 NOTE — Progress Notes (Signed)
Per biologics zytiga was shipped fedex 08/15/15

## 2015-09-02 ENCOUNTER — Other Ambulatory Visit: Payer: Self-pay | Admitting: Cardiovascular Disease

## 2015-09-07 ENCOUNTER — Encounter: Payer: Self-pay | Admitting: Family

## 2015-09-07 ENCOUNTER — Other Ambulatory Visit: Payer: Self-pay | Admitting: *Deleted

## 2015-09-10 ENCOUNTER — Telehealth: Payer: Self-pay | Admitting: *Deleted

## 2015-09-10 NOTE — Telephone Encounter (Signed)
"  I have a bone scan tomorrow that will end about three o'clock. I'm scheduled for lab at 2:00 pm.  Can I just come over at any time tomorrow for lab?"  Advised lab is scheduled at 2:00 and he returns to radiology at 2:45 pm for Bone scan.  Large block of time between injection and scan to allow the material to be absorbed to the different areas of the body.  Make sure he returns to radiology at 2:45 after lab draw.

## 2015-09-11 ENCOUNTER — Other Ambulatory Visit (HOSPITAL_BASED_OUTPATIENT_CLINIC_OR_DEPARTMENT_OTHER): Payer: Medicare Other

## 2015-09-11 ENCOUNTER — Encounter (HOSPITAL_COMMUNITY)
Admission: RE | Admit: 2015-09-11 | Discharge: 2015-09-11 | Disposition: A | Discharge status: Home / Self Care | Payer: Medicare Other | Source: Ambulatory Visit | Attending: Oncology | Admitting: Oncology

## 2015-09-11 ENCOUNTER — Ambulatory Visit (HOSPITAL_COMMUNITY)
Admission: RE | Admit: 2015-09-11 | Discharge: 2015-09-11 | Disposition: A | Discharge status: Home / Self Care | Payer: Medicare Other | Source: Ambulatory Visit | Attending: Oncology | Admitting: Oncology

## 2015-09-11 DIAGNOSIS — C61 Malignant neoplasm of prostate: Secondary | ICD-10-CM

## 2015-09-11 DIAGNOSIS — C7951 Secondary malignant neoplasm of bone: Secondary | ICD-10-CM | POA: Insufficient documentation

## 2015-09-11 LAB — CBC WITH DIFFERENTIAL/PLATELET
BASO%: 0.3 % (ref 0.0–2.0)
Basophils Absolute: 0 10*3/uL (ref 0.0–0.1)
EOS%: 1.1 % (ref 0.0–7.0)
Eosinophils Absolute: 0.1 10*3/uL (ref 0.0–0.5)
HCT: 38.4 % (ref 38.4–49.9)
HEMOGLOBIN: 13.3 g/dL (ref 13.0–17.1)
LYMPH%: 45.6 % (ref 14.0–49.0)
MCH: 30.7 pg (ref 27.2–33.4)
MCHC: 34.6 g/dL (ref 32.0–36.0)
MCV: 88.7 fL (ref 79.3–98.0)
MONO#: 0.6 10*3/uL (ref 0.1–0.9)
MONO%: 7.9 % (ref 0.0–14.0)
NEUT%: 45.1 % (ref 39.0–75.0)
NEUTROS ABS: 3.6 10*3/uL (ref 1.5–6.5)
Platelets: 186 10*3/uL (ref 140–400)
RBC: 4.33 10*6/uL (ref 4.20–5.82)
RDW: 15.6 % — AB (ref 11.0–14.6)
WBC: 7.9 10*3/uL (ref 4.0–10.3)
lymph#: 3.6 10*3/uL — ABNORMAL HIGH (ref 0.9–3.3)

## 2015-09-11 LAB — COMPREHENSIVE METABOLIC PANEL
ALBUMIN: 3.3 g/dL — AB (ref 3.5–5.0)
ALT: <9 U/L (ref 0–55)
ANION GAP: 8 meq/L (ref 3–11)
AST: 11 U/L (ref 5–34)
Alkaline Phosphatase: 90 U/L (ref 40–150)
BILIRUBIN TOTAL: 0.72 mg/dL (ref 0.20–1.20)
BUN: 11.1 mg/dL (ref 7.0–26.0)
CALCIUM: 9.1 mg/dL (ref 8.4–10.4)
CO2: 27 mEq/L (ref 22–29)
CREATININE: 1.1 mg/dL (ref 0.7–1.3)
Chloride: 108 mEq/L (ref 98–109)
EGFR: 80 mL/min/{1.73_m2} — ABNORMAL LOW (ref >90)
Glucose: 116 mg/dl (ref 70–140)
Potassium: 3.4 mEq/L — ABNORMAL LOW (ref 3.5–5.1)
SODIUM: 144 meq/L (ref 136–145)
Total Protein: 7 g/dL (ref 6.4–8.3)

## 2015-09-11 MED ORDER — TECHNETIUM TC 99M MEDRONATE IV KIT
27.3000 | PACK | Freq: Once | INTRAVENOUS | Status: AC | PRN
  Administered 2015-09-11: 27.3 via INTRAVENOUS

## 2015-09-12 ENCOUNTER — Other Ambulatory Visit: Payer: Self-pay | Admitting: Oncology

## 2015-09-12 LAB — PSA (PARALLEL TESTING): PSA: 3.93 ng/mL (ref <=4.00)

## 2015-09-12 LAB — PSA: PROSTATE SPECIFIC AG, SERUM: 4 ng/mL (ref 0.0–4.0)

## 2015-09-13 ENCOUNTER — Other Ambulatory Visit: Payer: Self-pay | Admitting: *Deleted

## 2015-09-13 ENCOUNTER — Ambulatory Visit (HOSPITAL_BASED_OUTPATIENT_CLINIC_OR_DEPARTMENT_OTHER): Payer: Medicare Other | Admitting: Oncology

## 2015-09-13 ENCOUNTER — Telehealth: Payer: Self-pay | Admitting: Oncology

## 2015-09-13 VITALS — BP 149/62 | HR 82 | Temp 98.2°F | Resp 18 | Wt 210.4 lb

## 2015-09-13 DIAGNOSIS — C7951 Secondary malignant neoplasm of bone: Secondary | ICD-10-CM | POA: Diagnosis not present

## 2015-09-13 DIAGNOSIS — C779 Secondary and unspecified malignant neoplasm of lymph node, unspecified: Secondary | ICD-10-CM

## 2015-09-13 DIAGNOSIS — G893 Neoplasm related pain (acute) (chronic): Secondary | ICD-10-CM

## 2015-09-13 DIAGNOSIS — C61 Malignant neoplasm of prostate: Secondary | ICD-10-CM

## 2015-09-13 MED ORDER — ABIRATERONE ACETATE 250 MG PO TABS: 1000.0000 mg | ORAL_TABLET | Freq: Every day | ORAL | Status: DC

## 2015-09-13 NOTE — Telephone Encounter (Signed)
Pt confirmed labs/ov per 01/26 POF, gave pt AVS and Calendar... KJ °

## 2015-09-13 NOTE — Progress Notes (Signed)
Hematology and Oncology Follow Up Visit  Wa Rumpf [id number] [birth year] 70 y.o. 09/13/2015 1:08 PM Harris,Alec L, MDCloward, Alec Rossetti, MD   Principle Diagnosis: 70 year old with Castration resistant prostate cancer. Alec Harris was diagnosed in 2003 with Gleason score 4 + 4 = 8. Now Alec Harris has metastatic disease with involvement of lymph nodes and bone.  Prior Therapy:   Alec Harris underwent a prostatectomy with a pathology that showed prostatic adenocarcinoma. Gleason score 4+4 equals 8, with the tumor involving the margins and the pathologic staging was pT4 N1 with 1 of 2 left pelvic lymph nodes involved.  Alec Harris was started on adjuvant hormone therapy with Lupron. Alec Harris developed a rising PSA and bone metastasis.  Alec Harris was then treated with ketoconazole and prednisone. Alec Harris developed a rising PSA with a doubling time of just over 6 months. Bone scan in September 2013 showed increased uptake in the right lower lumbar spine at L4 and L5 and abnormal uptake in the mid sternum which had progressed since the prior bone scan.  Current therapy:  Zytiga 1000 mg daily started in October 2013.  Lupron given at at [facility] urology.  Interim History: Alec Harris presents today for a follow-up visit. Since his last visit, Alec Harris reports no major complaints. Alec Harris does report intermittent pain in his sternum that response to nonsteroidal anti-inflammatories and resolves spontaneously. Alec Harris does not report any associated symptoms such as cough, shortness of breath or difficulty breathing. Alec Harris does not report any other skeletal complaints at this time. Has not reported any back pain or arthralgias.   Alec Harris continues to take Zytiga and have not reported any new side effects. Alec Harris does not report any lower extremity edema or GI complications.  His appetite have been excellent and performance status or pain at baseline. His quality of life have not changed.  Alec Harris denies any headaches blurred vision or double vision. Alec Harris denied any chest pain, palpitation  or leg edema. Alec Harris reports no nausea or vomiting. Denies hematuria or other evidence of bleeding.  Has not reported any cough or hemoptysis or hematemesis. Does not report any hematochezia or melena. Does not report any frequency urgency or hesitancy. Does not report any rash or lesions. Does not report any petechiae. Remainder of his review of systems was unremarkable.  Medications: I have reviewed the patient's current medications. No changes on my review today. Current Outpatient Prescriptions  Medication Sig Dispense Refill  . abiraterone Acetate (ZYTIGA) 250 MG tablet Take 4 tablets (1,000 mg total) by mouth daily. Take on an empty stomach 1 hour before or 2 hours after a meal 120 tablet 0  . amLODipine (NORVASC) 5 MG tablet   1  . aspirin EC 81 MG tablet Take 81 mg by mouth daily.    . Calcium Carb-Cholecalciferol (CALCIUM 500 +D) 500-400 MG-UNIT TABS Take 1 tablet by mouth 2 (two) times daily.    . cilostazol (PLETAL) 100 MG tablet Take 1 tablet (100 mg total) by mouth 2 (two) times daily. 60 tablet 3  . clopidogrel (PLAVIX) 75 MG tablet TAKE 1 TABLET BY MOUTH DAILY 30 tablet 6  . rosuvastatin (CRESTOR) 10 MG tablet TAKE 1 TABLET BY MOUTH DAILY. PT NEEDS APPOINTMENT FOR FURTHER REFILLS 30 tablet 0   No current facility-administered medications for this visit.     Allergies: No Known Allergies   Physical Exam: Blood pressure 149/62, pulse 82, temperature 98.2 F (36.8 C), temperature source Oral, resp. rate 18, weight 210 lb 6.4 oz (95.437 kg), SpO2 100 %.  ECOG: 1 General appearance: Alert, awake Alec Harris without distress. Head: Normocephalic, without obvious abnormality no oral ulcers or lesions. No thrush noted. Neck: no adenopathy, no thyroid masses. Lymph nodes: Cervical, supraclavicular, and axillary nodes normal. Heart: Regular rate and rhythm with a systolic murmur auscultated. Lung:chest clear, no wheezing, rales, normal symmetric air entry.  Chest wall examination showed  no abnormalities noted on inspection. Deep palpation does not elicit any tenderness. Abdomen: soft, non-tender, without masses or organomegaly no rebound or guarding. [id number] edema bilateral lower extremities Neurological examination: No deficits noted.  Lab Results: Lab Results  Component Value Date   WBC 7.9 09/11/2015   HGB 13.3 09/11/2015   HCT 38.4 09/11/2015   MCV 88.7 09/11/2015   PLT 186 09/11/2015     Chemistry      Component Value Date/Time   NA 144 09/11/2015 1404   NA 144 07/05/2013 0609   K 3.4* 09/11/2015 1404   K 3.7 07/05/2013 0609   CL 106 07/05/2013 0609   CL 106 02/01/2013 1120   CO2 27 09/11/2015 1404   CO2 27 04/09/2012 0500   BUN 11.1 09/11/2015 1404   BUN 11 07/05/2013 0609   CREATININE 1.1 09/11/2015 1404   CREATININE 1.00 07/05/2013 0609      Component Value Date/Time   CALCIUM 9.1 09/11/2015 1404   CALCIUM 8.6 04/09/2012 0500   ALKPHOS 90 09/11/2015 1404   ALKPHOS 96 01/14/2013 0952   AST 11 09/11/2015 1404   AST 15 01/14/2013 0952   ALT <9 09/11/2015 1404   ALT 17 01/14/2013 0952   BILITOT 0.72 09/11/2015 1404   BILITOT 0.7 01/14/2013 0952    Results for Alec, Harris (MRN [id number]) as of 09/13/2015 12:43  Ref. Range 05/09/2015 15:28 07/11/2015 15:06 09/11/2015 14:04  PSA Latest Ref Range: <=4.00 ng/mL 2.72 3.76 3.93     CLINICAL DATA: Prostate malignancy metastatic to bone, currently asymptomatic, PSA 3.76  EXAM: NUCLEAR MEDICINE WHOLE BODY BONE SCAN  TECHNIQUE: Whole body anterior and posterior images were obtained approximately 3 hours after intravenous injection of radiopharmaceutical.  RADIOPHARMACEUTICALS: 27.3 mCi Technetium-73m MDP IV  COMPARISON: Nuclear bone scan of May 18, 2012  FINDINGS: There is adequate uptake of the radiopharmaceutical by the skeleton. There is persistent increased uptake in the sternum. This appears slightly increase in longitudinal dimension. Uptake within the ribs, cervical and  thoracic spine, and calvarium is not suspicious for metastatic disease. A small stable focus of increased uptake in the L4 region posteriorly is again demonstrated. Activity within the upper and lower extremities is similar to that seen previously and compatible with degenerative change. Maxillary uptake is less conspicuous today.  IMPRESSION: 1. Increased size and conspicuity of sternal uptake suggest progressive disease. 2. No evidence of progressive metastatic disease or new bony metastases elsewhere. Decreased uptake in the maxilla may reflect resolution of periodontal disease      Impression and Plan:  This is a 70 year-old Alec Harris with the following issues:  1. Castration resistant prostate cancer with metastatic disease to the bone. Alec Harris is status post multiple therapies in the past and most recently have been on Zytiga for the last 3 years.  Alec Harris continues to tolerate Zytiga without any major complications. His PSA has slowly been rising with a doubling time of more than 6 months. His bone scan obtained on 09/11/2015 was reviewed today and for the most part showed no evidence of new metastasis. His sternal metastasis have slightly increased but no other areas of involvement.  Risks  and benefits of continuing Zytiga was reviewed today and we have elected to continue given the overall symptomatic and systemic benefit from this medication. I plan on changing him to a different salvage therapy upon rapid progression.  2. Androgen deprivation. Alec Harris remains on Lupron given at [facility] urology. This needs to be continued indefinitely.  3. Bone directed therapy. Continue calcium and vitamin D supplements. Bone directed therapy will be considered at a future date if Alec Harris develops more widespread disease.  4. Anemia: This have resolved at this time with a normal hemoglobin.  5. Sternal pain: It corresponds to sternal metastasis. His pain have been reasonably manageable with very little  pain medication. We have discussed the option of using stronger narcotic analgesia as well as possible radiation therapy if his pain gets worse. For the time being, Alec Harris is comfortable with taking nonsteroidal anti-inflammatory as needed and have been infrequent in nature.  6. Followup. In 8 weeks.    Zola Button, MD 1/26/20171:08 PM

## 2015-09-13 NOTE — Addendum Note (Signed)
Addended by: Randolm Idol on: 09/13/2015 01:24 PM   Modules accepted: Orders, Medications

## 2015-09-14 ENCOUNTER — Other Ambulatory Visit: Payer: Self-pay | Admitting: *Deleted

## 2015-09-14 DIAGNOSIS — Z48812 Encounter for surgical aftercare following surgery on the circulatory system: Secondary | ICD-10-CM

## 2015-09-14 DIAGNOSIS — I6523 Occlusion and stenosis of bilateral carotid arteries: Secondary | ICD-10-CM

## 2015-09-17 ENCOUNTER — Encounter: Payer: Self-pay | Admitting: Family

## 2015-09-17 ENCOUNTER — Ambulatory Visit (HOSPITAL_COMMUNITY)
Admission: RE | Admit: 2015-09-17 | Discharge: 2015-09-17 | Disposition: A | Discharge status: Home / Self Care | Payer: Medicare Other | Source: Ambulatory Visit | Attending: Family | Admitting: Family

## 2015-09-17 ENCOUNTER — Ambulatory Visit (INDEPENDENT_AMBULATORY_CARE_PROVIDER_SITE_OTHER): Payer: Medicare Other | Admitting: Family

## 2015-09-17 VITALS — BP 164/83 | HR 71 | Ht 69.0 in | Wt 210.8 lb

## 2015-09-17 DIAGNOSIS — I6523 Occlusion and stenosis of bilateral carotid arteries: Secondary | ICD-10-CM

## 2015-09-17 DIAGNOSIS — F17200 Nicotine dependence, unspecified, uncomplicated: Secondary | ICD-10-CM | POA: Insufficient documentation

## 2015-09-17 DIAGNOSIS — Z9889 Other specified postprocedural states: Secondary | ICD-10-CM | POA: Diagnosis not present

## 2015-09-17 DIAGNOSIS — E785 Hyperlipidemia, unspecified: Secondary | ICD-10-CM | POA: Insufficient documentation

## 2015-09-17 DIAGNOSIS — Z959 Presence of cardiac and vascular implant and graft, unspecified: Secondary | ICD-10-CM | POA: Diagnosis not present

## 2015-09-17 DIAGNOSIS — Z48812 Encounter for surgical aftercare following surgery on the circulatory system: Secondary | ICD-10-CM

## 2015-09-17 DIAGNOSIS — Z720 Tobacco use: Secondary | ICD-10-CM

## 2015-09-17 NOTE — Patient Instructions (Signed)
Stroke Prevention Some medical conditions and behaviors are associated with an increased chance of having a stroke. You may prevent a stroke by making healthy choices and managing medical conditions. HOW CAN I REDUCE MY RISK OF HAVING A STROKE?   Stay physically active. Get at least 30 minutes of activity on most or all days.  Do not smoke. It may also be helpful to avoid exposure to secondhand smoke.  Limit alcohol use. Moderate alcohol use is considered to be:  No more than 2 drinks per day for men.  No more than 1 drink per day for nonpregnant women.  Eat healthy foods. This involves:  Eating 5 or more servings of fruits and vegetables a day.  Making dietary changes that address high blood pressure (hypertension), high cholesterol, diabetes, or obesity.  Manage your cholesterol levels.  Making food choices that are high in fiber and low in saturated fat, trans fat, and cholesterol may control cholesterol levels.  Take any prescribed medicines to control cholesterol as directed by your health care provider.  Manage your diabetes.  Controlling your carbohydrate and sugar intake is recommended to manage diabetes.  Take any prescribed medicines to control diabetes as directed by your health care provider.  Control your hypertension.  Making food choices that are low in salt (sodium), saturated fat, trans fat, and cholesterol is recommended to manage hypertension.  Ask your health care provider if you need treatment to lower your blood pressure. Take any prescribed medicines to control hypertension as directed by your health care provider.  If you are 18-39 years of age, have your blood pressure checked every 3-5 years. If you are 40 years of age or older, have your blood pressure checked every year.  Maintain a healthy weight.  Reducing calorie intake and making food choices that are low in sodium, saturated fat, trans fat, and cholesterol are recommended to manage  weight.  Stop drug abuse.  Avoid taking birth control pills.  Talk to your health care provider about the risks of taking birth control pills if you are over 35 years old, smoke, get migraines, or have ever had a blood clot.  Get evaluated for sleep disorders (sleep apnea).  Talk to your health care provider about getting a sleep evaluation if you snore a lot or have excessive sleepiness.  Take medicines only as directed by your health care provider.  For some people, aspirin or blood thinners (anticoagulants) are helpful in reducing the risk of forming abnormal blood clots that can lead to stroke. If you have the irregular heart rhythm of atrial fibrillation, you should be on a blood thinner unless there is a good reason you cannot take them.  Understand all your medicine instructions.  Make sure that other conditions (such as anemia or atherosclerosis) are addressed. SEEK IMMEDIATE MEDICAL CARE IF:   You have sudden weakness or numbness of the face, arm, or leg, especially on one side of the body.  Your face or eyelid droops to one side.  You have sudden confusion.  You have trouble speaking (aphasia) or understanding.  You have sudden trouble seeing in one or both eyes.  You have sudden trouble walking.  You have dizziness.  You have a loss of balance or coordination.  You have a sudden, severe headache with no known cause.  You have new chest pain or an irregular heartbeat. Any of these symptoms may represent a serious problem that is an emergency. Do not wait to see if the symptoms will   go away. Get medical help at once. Call your local emergency services (911 in [place] Do not drive yourself to the hospital.   This information is not intended to replace advice given to you by your health care provider. Make sure you discuss any questions you have with your health care provider.   Document Released: 09/11/2004 Document Revised: 08/25/2014 Document Reviewed:  02/04/2013 Elsevier Interactive Patient Education 2016 [employer]  

## 2015-09-17 NOTE — Progress Notes (Signed)
Chief Complaint: Extracranial Carotid Artery Stenosis   History of Present Illness  Alec Harris is a 70 y.o. male patient of Dr. Trula Slade who is s/p left carotid stenting with distal embolic protection on [id number].  He has previously undergone a right carotid endarterectomy in 04/08/2012. He returns today for follow up.  The patient denies any history of TIA or stroke symptoms, specifically the patient denies a history of amaurosis fugax or monocular blindness, denies a history unilateral of facial drooping, denies a history of hemiplegia, and denies a history of receptive or expressive aphasia.   Dr. Gwenlyn Found has been following him for his PAD, pt seems to have some claudication, denies non healing wounds.   He played golf for 40 years, quit due to left shoulder feeling too tight.  He is looking for another way to exercise.  He states that his blood pressure increases in a medical office.   The patient denies New Medical or Surgical History.  Pt Diabetic: No Pt smoker: smoker (1/2 ppd, started at age 16 yrs)  Pt meds include: Statin : Yes ASA: Yes Other anticoagulants/antiplatelets: Plavix advised to be stopped by Dr. [name] Blew (per pt), Pletal, prescribed by Dr. Gwenlyn Found    Past Medical History  Diagnosis Date  . Prostate cancer ([place])   . Rectal bleeding 07/24/11    Diverticular Bleeding  . PAD (peripheral artery disease) ([place]) 01/2012    severe diffuse his iliacs,SFA, carotids  . Carotid artery disease ([place])     By doppler  . Hyperlipemia   . Claudication ([place]) 02/06/2012    ABI of 0.5 on both legs ,Rgt ext. iliac70 to 99%,rgt SFA occlude,lft ext.iliac common fem 70 to 99% ,Lft SFA occluded,mono flow popliteal  . Tobacco abuse     Social History Social History  Substance Use Topics  . Smoking status: Light Tobacco Smoker -- 50 years    Types: Cigarettes  . Smokeless tobacco: Never Used     Comment: pt states that he is going to quit completely; 1/2 pack per day   . Alcohol Use: 3.0 oz/week    3 Cans of beer, 2 Shots of liquor per week    Family History Family History  Problem Relation Age of Onset  . Hypertension Mother   . Heart attack Mother   . Heart disease Mother     CHF/  After age 28  . Heart attack Father   . Heart disease Father     Irregular Heart beat, Heart Disease before age 47  . Cancer Brother     throat    Surgical History Past Surgical History  Procedure Laterality Date  . Knee cartilage surgery      surgery for a torn ligament  . Cervical spine surgery  approx. 10 years ago    disc removed from neck  . Colonoscopy  07/25/2011    Procedure: COLONOSCOPY;  Surgeon: Beryle Beams;  Location: [facility];  Service: Endoscopy;  Laterality: N/A;  . Prostate surgery  2003    for  prostate cancer  . Penile prosthesis implant    . Endarterectomy  04/08/2012    Procedure: ENDARTERECTOMY CAROTID;  Surgeon: Serafina Mitchell, MD;  Location: [facility] OR;  Service: Vascular;  Laterality: Right;  Right carotid endarterectomy with vascuguard 1cm x 6cm bovine patch angioplasty.   . Carotid angiogram  03/02/2012    bilateral carotid artery stenosis,95% right and 80% left with a type 2 arch  . Pv angiogram  03/02/2012  total SFAs bilaterally with 3- vessel runoff below the knee, high-grade calcified iliac diseaese  . Doppler echocardiography  03/24/2012    EF >70% LV normal  . Lexiscan myoview  01/14/2012    small fixed basal to mid inferior bowel attenuation artifact. no reversible ischemia  . Carotid endarterectomy Right 04-08-12    CEA  . Carotid angiogram N/A 03/02/2012    Procedure: CAROTID ANGIOGRAM;  Surgeon: Lorretta Harp, MD;  Location: [facility] CATH LAB;  Service: Cardiovascular;  Laterality: N/A;  . Lower extremity angiogram N/A 03/02/2012    Procedure: LOWER EXTREMITY ANGIOGRAM;  Surgeon: Lorretta Harp, MD;  Location: [facility] CATH LAB;  Service: Cardiovascular;  Laterality: N/A;  . Carotid stent insertion Left 07/05/2013     Procedure: CAROTID STENT INSERTION;  Surgeon: Serafina Mitchell, MD;  Location: [facility] CATH LAB;  Service: Cardiovascular;  Laterality: Left;    No Known Allergies  Current Outpatient Prescriptions  Medication Sig Dispense Refill  . abiraterone Acetate (ZYTIGA) 250 MG tablet Take 4 tablets (1,000 mg total) by mouth daily. Take on an empty stomach 1 hour before or 2 hours after a meal 120 tablet 0  . amLODipine (NORVASC) 5 MG tablet   1  . aspirin EC 81 MG tablet Take 81 mg by mouth daily.    . Calcium Carb-Cholecalciferol (CALCIUM 500 +D) 500-400 MG-UNIT TABS Take 1 tablet by mouth 2 (two) times daily.    . cilostazol (PLETAL) 100 MG tablet Take 1 tablet (100 mg total) by mouth 2 (two) times daily. 60 tablet 3  . rosuvastatin (CRESTOR) 10 MG tablet TAKE 1 TABLET BY MOUTH DAILY. PT NEEDS APPOINTMENT FOR FURTHER REFILLS 30 tablet 0   No current facility-administered medications for this visit.    Review of Systems : See HPI for pertinent positives and negatives.  Physical Examination  Filed Vitals:   09/17/15 1552 09/17/15 1554 09/17/15 1557  BP: 168/79 183/87 164/83  Pulse: 71    Height: 5\' 9"  (1.753 m)    Weight: 210 lb 12.8 oz (95.618 kg)    SpO2: 99%     Body mass index is 31.12 kg/(m^2).  General: WDWN obese male in NAD GAIT: normal Eyes: PERRLA Pulmonary: Non-labored, CTAB with decreased air movement in all fields, no rales,  rhonchi, or wheezing.  Cardiac: regular Rhythm,positive murmur.  VASCULAR EXAM Carotid Bruits Right Left   Transmitted cardiac murmur Transmitted cardiac murmur    Aorta is palpable. Radial pulses are 2+ palpable and equal.      LE Pulses Right Left   FEMORAL not palpable  not palpable    POPLITEAL not palpable  not palpable   POSTERIOR TIBIAL not palpable  not palpable     DORSALIS PEDIS  ANTERIOR TIBIAL not palpable  not palpable     Gastrointestinal: soft, nontender, BS WNL, no r/g,no palpable  masses.  Musculoskeletal: No muscle atrophy/wasting. M/S 5/5 throughout, Extremities without ischemic changes.  Neurologic: A&O X 3; Appropriate Affect,  Speech is normal CN 2-12 intact, Pain and light touch intact in extremities, Motor exam as listed above.          Non-Invasive Vascular Imaging CAROTID DUPLEX 09/17/2015   Right ICA: CEA site , no restenosis. Left ICA: stent site with no restenosis. No significant change compared to prior exam    Assessment: Alec Harris is a 70 y.o. male who is s/p right carotid endarterectomy on 04/08/2012 and left internal carotid artery stent placement on 07/05/2013. He has no hx of stroke  or TIA. Today's carotid duplex suggests widely patent right CEA site and left internal carotid artery stent with no restenosis.   Unfortunately he continues to smoke but indicates he would like to quit, see Plan.  Dr. Gwenlyn Found has been following him for his PAD, pt seems to have some claudication, denies non healing wounds.   I advised pt to see his PCP or his cardiologist re his elevated blood pressure.    Plan:  The patient was counseled re smoking cessation and given several free resources re smoking cessation.   Follow-up in 1 year with Carotid Duplex scan.   I discussed in depth with the patient the nature of atherosclerosis, and emphasized the importance of maximal medical management including strict control of blood pressure, blood glucose, and lipid levels, obtaining regular exercise, and cessation of smoking.  The patient is aware that without maximal medical management the underlying atherosclerotic disease process will progress, limiting the benefit of any interventions. The patient was given information about stroke prevention and what symptoms should prompt the patient to seek immediate medical  care. Thank you for allowing [place] to participate in this patient's care.  Clemon Chambers, RN, MSN, FNP-C [facility] Office: [facility] Physician: Trula Slade  09/17/2015 4:14 PM

## 2015-09-19 ENCOUNTER — Other Ambulatory Visit: Payer: Self-pay | Admitting: *Deleted

## 2015-09-19 ENCOUNTER — Encounter: Payer: Self-pay | Admitting: Oncology

## 2015-09-19 ENCOUNTER — Encounter: Payer: Self-pay | Admitting: *Deleted

## 2015-09-19 DIAGNOSIS — C61 Malignant neoplasm of prostate: Secondary | ICD-10-CM

## 2015-09-19 MED ORDER — ABIRATERONE ACETATE 250 MG PO TABS: 1000.0000 mg | ORAL_TABLET | Freq: Every day | ORAL | Status: DC

## 2015-09-19 NOTE — Addendum Note (Signed)
Addended by: Thresa Ross C on: 09/19/2015 10:09 AM   Modules accepted: Orders

## 2015-09-19 NOTE — Progress Notes (Signed)
I recd fax from [employer] for zytiga asst.

## 2015-09-19 NOTE — Progress Notes (Signed)
Mrs., Alec Harris will have him come and sign j&J app and bring proof of income for them. I advised her for poss asst with zytiga.

## 2015-09-20 ENCOUNTER — Encounter: Payer: Self-pay | Admitting: Oncology

## 2015-09-20 NOTE — Progress Notes (Signed)
Patient came in to sign j&j app for poss zytiga asst. I faxed to [phone]

## 2015-10-02 ENCOUNTER — Telehealth: Payer: Self-pay

## 2015-10-02 NOTE — Telephone Encounter (Signed)
Pt is requesting update on his request for financial assistance from [place] and [place]. This is for zytiga costs.

## 2015-10-04 ENCOUNTER — Telehealth: Payer: Self-pay | Admitting: *Deleted

## 2015-10-04 ENCOUNTER — Other Ambulatory Visit: Payer: Self-pay | Admitting: *Deleted

## 2015-10-04 ENCOUNTER — Encounter: Payer: Self-pay | Admitting: *Deleted

## 2015-10-04 MED ORDER — HYDROCODONE-ACETAMINOPHEN 10-325 MG PO TABS: 1.0000 | ORAL_TABLET | Freq: Four times a day (QID) | ORAL | Status: DC | PRN

## 2015-10-04 NOTE — Telephone Encounter (Signed)
Patient c/o increased sternal pain. States advil not strong enough. Per cyndee bacon NP, vicodin script left at front for patient p/u. May have 1-2 po q 6 hours prn pain. Instructed patient to start out with 1 tablet and not to take extra tylenol. Ok to take advil in between vicodin dose. Note to dr Hazeline Junker desk.

## 2015-10-15 ENCOUNTER — Other Ambulatory Visit: Payer: Self-pay | Admitting: Cardiovascular Disease

## 2015-10-15 NOTE — Telephone Encounter (Signed)
Rx(s) sent to pharmacy electronically.  

## 2015-10-19 ENCOUNTER — Encounter: Payer: Self-pay | Admitting: *Deleted

## 2015-10-19 ENCOUNTER — Other Ambulatory Visit: Payer: Self-pay | Admitting: Oncology

## 2015-10-19 DIAGNOSIS — C61 Malignant neoplasm of prostate: Secondary | ICD-10-CM

## 2015-10-19 MED ORDER — ABIRATERONE ACETATE 250 MG PO TABS: 1000.0000 mg | ORAL_TABLET | Freq: Every day | ORAL | Status: DC

## 2015-10-19 MED FILL — ZYTIGA 250 MG TABLET: 250 | 30 days supply | Qty: 120 | Fill #0

## 2015-10-24 ENCOUNTER — Ambulatory Visit: Payer: Medicare Other | Admitting: Cardiovascular Disease

## 2015-10-28 ENCOUNTER — Other Ambulatory Visit: Payer: Self-pay | Admitting: Cardiovascular Disease

## 2015-10-29 NOTE — Telephone Encounter (Signed)
REFILL 

## 2015-11-06 ENCOUNTER — Encounter: Payer: Self-pay | Admitting: Cardiovascular Disease

## 2015-11-06 ENCOUNTER — Ambulatory Visit (INDEPENDENT_AMBULATORY_CARE_PROVIDER_SITE_OTHER): Payer: Medicare Other | Admitting: Cardiovascular Disease

## 2015-11-06 DIAGNOSIS — I6523 Occlusion and stenosis of bilateral carotid arteries: Secondary | ICD-10-CM | POA: Diagnosis not present

## 2015-11-06 DIAGNOSIS — E785 Hyperlipidemia, unspecified: Secondary | ICD-10-CM | POA: Diagnosis not present

## 2015-11-06 DIAGNOSIS — Z79899 Other long term (current) drug therapy: Secondary | ICD-10-CM

## 2015-11-06 DIAGNOSIS — I739 Peripheral vascular disease, unspecified: Secondary | ICD-10-CM

## 2015-11-06 NOTE — Assessment & Plan Note (Signed)
History of hyperlipidemia on Crestor . We will repeat a lipid and liver profile.

## 2015-11-06 NOTE — Patient Instructions (Addendum)
Medication Instructions:  Your physician recommends that you continue on your current medications as directed. Please refer to the Current Medication list given to you today.   Labwork: Your physician recommends that you return for lab work in: FASTING (lipid/liver) The lab can be found on the FIRST FLOOR of out building in [address]   Testing/Procedures: Your physician has requested that you have a lower extremity arterial doppler- During this test, ultrasound is used to evaluate arterial blood flow in the legs. Allow approximately one hour for this exam.    Follow-Up: Your physician wants you to follow-up in: 12 months with Dr. Gwenlyn Found. You will receive a reminder letter in the mail two months in advance. If you don't receive a letter, please call our office to schedule the follow-up appointment.   Any Other Special Instructions Will Be Listed Below (If Applicable).     If you need a refill on your cardiac medications before your next appointment, please call your pharmacy.

## 2015-11-06 NOTE — Assessment & Plan Note (Signed)
History of peripheral arterial disease status post angiography 03/02/12 revealing high-grade calcified left common and right external iliac artery stenosis in the 80% range. Hemoglobin 75% left common femoral artery stenosis and occluded SFAs bilaterally with three-vessel runoff. His most recent Dopplers performed 09/28/14 revealed ABIs in the mid 0.6 range with high-frequency signals in both iliac arteries. He really denies lifestyle limiting claudication however and I have had yet to intervene on his iliac arteries

## 2015-11-06 NOTE — Progress Notes (Signed)
11/06/2015 Alec Harris   03/09/1946  [id number]  Primary Physician Thurman Coyer, MD Primary Cardiologist: Lorretta Harp MD Renae Gloss   HPI:   The patient is a 70 year old mildly overweight married African American male father of 2, grandfather to 2 grandchildren who I last saw in the office 09/15/14.[name] He was referred to me by Dr. Debara Pickett for PV evaluation. He has severe diffuse vascular disease involving his iliacs, SFAs and carotids. His claudication improved with Pletal. His other problems include hyperlipidemia. He had a negative Myoview Jan 14, 2012. He does continue to smoke. He stopped his Crestor on his own and did have an elevated lipid profile in the past with a total cholesterol of 255, LDL of 169 and HDL of 56. I angiogrammed him on March 02, 2012 revealing total SFAs bilaterally with 3-vessel runoff below the knee, high-grade calcified iliac disease, and bilateral carotid artery stenosis, 95% right and 80% left with a type 2 arch. He underwent elective right carotid endarterectomy by Dr. Annamarie Major on April 08, 2012.over the last 6 months he denies chest pain shortness of breath or claudication. The Pletal has been beneficial. Unfortunately he did not obtain his carotid artery Doppler studies as originally intended. He had followup carotid Dopplers performed in our office on 01/18/13 which revealed a widely patent right carotid endarterectomy site, and progression of disease on the left which had already been documented angiographically to be in the 80% range. He was neurologically asymptomatic. He ultimately underwent elective left internal carotid artery stenting by myself and Dr. Trula Slade 07/05/13. He denies chest pain or shortness of breath but does complain of lifestyle limiting claudication. Lower cineangiography performed 03/02/12 by myself revealed occluded SFAs bilaterally with high-grade calcified right external and left common iliac artery stenosis. His last  Dopplers performed 09/28/14 revealed ABIs in the midpoint 6 range bilaterally with occluded SFAs and high-grade iliacs bilaterally. The patient really denies lifestyle limiting claudication, chest pain or shortness of breath.  Current Outpatient Prescriptions  Medication Sig Dispense Refill  . abiraterone Acetate (ZYTIGA) 250 MG tablet Take 4 tablets (1,000 mg total) by mouth daily. Take on an empty stomach 1 hour before or 2 hours after a meal 120 tablet 2  . amLODipine (NORVASC) 5 MG tablet   1  . aspirin EC 81 MG tablet Take 81 mg by mouth daily.    . Calcium Carb-Cholecalciferol (CALCIUM 500 +D) 500-400 MG-UNIT TABS Take 1 tablet by mouth 2 (two) times daily.    . cilostazol (PLETAL) 100 MG tablet Take 1 tablet (100 mg total) by mouth 2 (two) times daily. 60 tablet 3  . HYDROcodone-acetaminophen (NORCO) 10-325 MG tablet Take 1-2 tablets by mouth every 6 (six) hours as needed. 30 tablet 0  . rosuvastatin (CRESTOR) 10 MG tablet TAKE 1 TABLET (10 MG TOTAL) BY MOUTH DAILY. <PLEASE MAKE APPOINTMENT FOR REFILLS> 30 tablet 1   No current facility-administered medications for this visit.    No Known Allergies  Social History   Social History  . Marital Status: Married    Spouse Name: N/A  . Number of Children: N/A  . Years of Education: N/A   Occupational History  . Not on file.   Social History Main Topics  . Smoking status: Light Tobacco Smoker -- 50 years    Types: Cigarettes  . Smokeless tobacco: Never Used     Comment: pt states that he is going to quit completely; 1/2 pack per day  . Alcohol Use:  3.0 oz/week    3 Cans of beer, 2 Shots of liquor per week  . Drug Use: No  . Sexual Activity: No     Comment: smokes 5 cigarettes per day....   Other Topics Concern  . Not on file   Social History Narrative     Review of Systems: General: negative for chills, fever, night sweats or weight changes.  Cardiovascular: negative for chest pain, dyspnea on exertion, edema, orthopnea,  palpitations, paroxysmal nocturnal dyspnea or shortness of breath Dermatological: negative for rash Respiratory: negative for cough or wheezing Urologic: negative for hematuria Abdominal: negative for nausea, vomiting, diarrhea, bright red blood per rectum, melena, or hematemesis Neurologic: negative for visual changes, syncope, or dizziness All other systems reviewed and are otherwise negative except as noted above.    There were no vitals taken for this visit.  General appearance: alert and no distress Neck: no adenopathy, no JVD, supple, symmetrical, trachea midline, thyroid not enlarged, symmetric, no tenderness/mass/nodules and bilateral carotid bruits Lungs: clear to auscultation bilaterally Heart: soft outflow tract murmur consistent with aortic stenosis and/or sclerosis Extremities: extremities normal, atraumatic, no cyanosis or edema  EKG normal sinus rhythm at 81 without ST or T-wave changes. I personally reviewed this EKG  ASSESSMENT AND PLAN:   Occlusion and stenosis of carotid artery without mention of cerebral infarction History of carotid artery disease with angiographically documented high-grade bilateral internal carotid artery stenosis which I performed 03/02/12. He underwent elective right carotid endarterectomy by Dr. Trula Slade  04/08/12 as well as left internal carotid artery stenting by myself and Dr. Trula Slade 07/05/13. His Dopplers are followed by Dr. Trula Slade and they have remained patent.  PVD (peripheral vascular disease) with claudication History of peripheral arterial disease status post angiography 03/02/12 revealing high-grade calcified left common and right external iliac artery stenosis in the 80% range. Hemoglobin 75% left common femoral artery stenosis and occluded SFAs bilaterally with three-vessel runoff. His most recent Dopplers performed 09/28/14 revealed ABIs in the mid 0.6 range with high-frequency signals in both iliac arteries. He really denies lifestyle  limiting claudication however and I have had yet to intervene on his iliac arteries  Hyperlipemia History of hyperlipidemia on Crestor . We will repeat a lipid and liver profile.      Lorretta Harp MD FACP,FACC,FAHA, [facility] 11/06/2015 4:10 PM

## 2015-11-06 NOTE — Assessment & Plan Note (Signed)
History of carotid artery disease with angiographically documented high-grade bilateral internal carotid artery stenosis which I performed 03/02/12. He underwent elective right carotid endarterectomy by Dr. Trula Slade  04/08/12 as well as left internal carotid artery stenting by myself and Dr. Trula Slade 07/05/13. His Dopplers are followed by Dr. Trula Slade and they have remained patent.

## 2015-11-13 ENCOUNTER — Ambulatory Visit: Payer: Medicare Other | Admitting: Cardiovascular Disease

## 2015-11-14 ENCOUNTER — Telehealth: Payer: Self-pay | Admitting: Oncology

## 2015-11-14 ENCOUNTER — Other Ambulatory Visit (HOSPITAL_BASED_OUTPATIENT_CLINIC_OR_DEPARTMENT_OTHER): Payer: Medicare Other

## 2015-11-14 ENCOUNTER — Ambulatory Visit (HOSPITAL_BASED_OUTPATIENT_CLINIC_OR_DEPARTMENT_OTHER): Payer: Medicare Other | Admitting: Oncology

## 2015-11-14 VITALS — BP 140/63 | HR 76 | Temp 99.0°F | Resp 18 | Ht 69.0 in | Wt 212.2 lb

## 2015-11-14 DIAGNOSIS — E291 Testicular hypofunction: Secondary | ICD-10-CM | POA: Diagnosis not present

## 2015-11-14 DIAGNOSIS — C61 Malignant neoplasm of prostate: Secondary | ICD-10-CM | POA: Diagnosis not present

## 2015-11-14 DIAGNOSIS — C7951 Secondary malignant neoplasm of bone: Secondary | ICD-10-CM | POA: Diagnosis not present

## 2015-11-14 LAB — COMPREHENSIVE METABOLIC PANEL
AST: 11 U/L (ref 5–34)
Albumin: 3.2 g/dL — ABNORMAL LOW (ref 3.5–5.0)
Alkaline Phosphatase: 80 U/L (ref 40–150)
Anion Gap: 7 mEq/L (ref 3–11)
BUN: 11.2 mg/dL (ref 7.0–26.0)
CALCIUM: 8.9 mg/dL (ref 8.4–10.4)
CHLORIDE: 107 meq/L (ref 98–109)
CO2: 30 mEq/L — ABNORMAL HIGH (ref 22–29)
CREATININE: 0.9 mg/dL (ref 0.7–1.3)
EGFR: >90 mL/min/{1.73_m2} (ref >90)
Glucose: 100 mg/dl (ref 70–140)
Potassium: 4.2 mEq/L (ref 3.5–5.1)
Sodium: 144 mEq/L (ref 136–145)
Total Bilirubin: 0.66 mg/dL (ref 0.20–1.20)
Total Protein: 6.8 g/dL (ref 6.4–8.3)

## 2015-11-14 LAB — CBC WITH DIFFERENTIAL/PLATELET
BASO%: 0.9 % (ref 0.0–2.0)
Basophils Absolute: 0.1 10*3/uL (ref 0.0–0.1)
EOS%: 1.8 % (ref 0.0–7.0)
Eosinophils Absolute: 0.1 10*3/uL (ref 0.0–0.5)
HEMATOCRIT: 40 % (ref 38.4–49.9)
HEMOGLOBIN: 13.4 g/dL (ref 13.0–17.1)
LYMPH#: 3.3 10*3/uL (ref 0.9–3.3)
LYMPH%: 45.6 % (ref 14.0–49.0)
MCH: 30.8 pg (ref 27.2–33.4)
MCHC: 33.4 g/dL (ref 32.0–36.0)
MCV: 92 fL (ref 79.3–98.0)
MONO#: 0.7 10*3/uL (ref 0.1–0.9)
MONO%: 9.6 % (ref 0.0–14.0)
NEUT%: 42.1 % (ref 39.0–75.0)
NEUTROS ABS: 3.1 10*3/uL (ref 1.5–6.5)
Platelets: 218 10*3/uL (ref 140–400)
RBC: 4.34 10*6/uL (ref 4.20–5.82)
RDW: 14.6 % (ref 11.0–14.6)
WBC: 7.3 10*3/uL (ref 4.0–10.3)

## 2015-11-14 NOTE — Progress Notes (Signed)
Hematology and Oncology Follow Up Visit  Alec Chace [id number] 1946-03-07 70 y.o. 11/14/2015 1:27 PM Alec Harris, MDCloward, [name], MD   Principle Diagnosis: 70 year old with Castration resistant prostate cancer. He was diagnosed in 2003 with Gleason score 4 + 4 = 8. Now he has metastatic disease with involvement of lymph nodes and bone.  Prior Therapy:   He underwent a prostatectomy with a pathology that showed prostatic adenocarcinoma. Gleason score 4+4 equals 8, with the tumor involving the margins and the pathologic staging was pT4 N1 with 1 of 2 left pelvic lymph nodes involved.  He was started on adjuvant hormone therapy with Lupron. He developed a rising PSA and bone metastasis.  He was then treated with ketoconazole and prednisone. He developed a rising PSA with a doubling time of just over 6 months. Bone scan in September 2013 showed increased uptake in the right lower lumbar spine at L4 and L5 and abnormal uptake in the mid sternum which had progressed since the prior bone scan.  Current therapy:  Zytiga 1000 mg daily started in October 2013.  Lupron given at at [facility].  Interim History: Alec Harris presents today for a follow-up visit. Since his last visit, he continues to do well without any recent changes. He does not report any  symptoms such as cough, shortness of breath or difficulty breathing. He does not report any other skeletal complaints at this time. Has not reported any back pain or arthralgias. He continues to enjoy excellent quality of life and attends to activities of daily living. He is celebrated his birthday 2 days ago.   He continues to take Zytiga and have not reported any new side effects. He does not report any lower extremity edema or GI complications.  He denied any lower extremity edema or electrolyte imbalance. He denied any easy bruisability.  He denies any headaches blurred vision or double vision. He denied any chest pain, palpitation or  leg edema. He reports no nausea or vomiting. Denies hematuria or other evidence of bleeding.  Has not reported any cough or hemoptysis or hematemesis. Does not report any hematochezia or melena. Does not report any frequency urgency or hesitancy. Does not report any rash or lesions. Does not report any petechiae. Remainder of his review of systems was unremarkable.  Medications: I have reviewed the patient's current medications. No changes on my review today. Current Outpatient Prescriptions  Medication Sig Dispense Refill  . abiraterone Acetate (ZYTIGA) 250 MG tablet Take 4 tablets (1,000 mg total) by mouth daily. Take on an empty stomach 1 hour before or 2 hours after a meal 120 tablet 2  . amLODipine (NORVASC) 5 MG tablet   1  . aspirin EC 81 MG tablet Take 81 mg by mouth daily.    . Calcium Carb-Cholecalciferol (CALCIUM 500 +D) 500-400 MG-UNIT TABS Take 1 tablet by mouth 2 (two) times daily.    . cilostazol (PLETAL) 100 MG tablet Take 1 tablet (100 mg total) by mouth 2 (two) times daily. 60 tablet 3  . HYDROcodone-acetaminophen (NORCO) 10-325 MG tablet Take 1-2 tablets by mouth every 6 (six) hours as needed. 30 tablet 0  . rosuvastatin (CRESTOR) 10 MG tablet TAKE 1 TABLET (10 MG TOTAL) BY MOUTH DAILY. <PLEASE MAKE APPOINTMENT FOR REFILLS> 30 tablet 1   No current facility-administered medications for this visit.     Allergies: No Known Allergies   Physical Exam: Blood pressure 140/63, pulse 76, temperature 99 F (37.2 C), temperature source Oral,  resp. rate 18, height 5\' 9"  (1.753 m), weight 212 lb 3.2 oz (96.253 kg), SpO2 99 %.  ECOG: 1 General appearance: Alert, awake gentleman Without distress. Head: Normocephalic, without obvious abnormality  Neck: no adenopathy.  Lymph nodes: Cervical, supraclavicular, and axillary nodes normal. Heart: Regular rate and rhythm with a systolic murmur auscultated. Lung:chest clear, no wheezing, rales, normal symmetric air entry. No dullness to  percussion. Chest wall examination showed no abnormalities noted on inspection.  Abdomen: soft, non-tender, without masses or organomegaly no rebound or guarding. [id number] edema bilateral lower extremities Neurological examination: No deficits noted.  Lab Results: Lab Results  Component Value Date   WBC 7.3 11/14/2015   HGB 13.4 11/14/2015   HCT 40.0 11/14/2015   MCV 92.0 11/14/2015   PLT 218 11/14/2015     Chemistry      Component Value Date/Time   NA 144 09/11/2015 1404   NA 144 07/05/2013 0609   K 3.4* 09/11/2015 1404   K 3.7 07/05/2013 0609   CL 106 07/05/2013 0609   CL 106 02/01/2013 1120   CO2 27 09/11/2015 1404   CO2 27 04/09/2012 0500   BUN 11.1 09/11/2015 1404   BUN 11 07/05/2013 0609   CREATININE 1.1 09/11/2015 1404   CREATININE 1.00 07/05/2013 0609      Component Value Date/Time   CALCIUM 9.1 09/11/2015 1404   CALCIUM 8.6 04/09/2012 0500   ALKPHOS 90 09/11/2015 1404   ALKPHOS 96 01/14/2013 0952   AST 11 09/11/2015 1404   AST 15 01/14/2013 0952   ALT <9 09/11/2015 1404   ALT 17 01/14/2013 0952   BILITOT 0.72 09/11/2015 1404   BILITOT 0.7 01/14/2013 0952     Results for Alec Harris, Alec Harris (MRN [id number]) as of 11/14/2015 13:19  Ref. Range 07/11/2015 15:06 09/11/2015 14:04  PSA Latest Ref Range: <=4.00 ng/mL 3.76 3.93     Impression and Plan:  This is a 69 year-old gentleman with the following issues:  1. Castration resistant prostate cancer with metastatic disease to the bone. He is status post multiple therapies in the past and most recently have been on Zytiga for the last 3 years.  He continues to tolerate Zytiga without any major complications. His PSA continues to rise slowly but he is completely asymptomatic. His bone scan did not change dramatically in January 2017. The plan is to continue with the same dose and schedule and use a different salvage regimen upon symptomatic progression.  2. Androgen deprivation. He remains on Lupron given at [facility]  urology every 4 months. I have recommended continuing this for the time being.  3. Bone directed therapy. Continue calcium and vitamin D supplements. Bone directed therapy will be considered at a future date if he develops more widespread disease.  4. Anemia: This have resolved at this time with a normal hemoglobin.  5. Sternal pain: This have resolved at this time. Could be related to metastatic bony lesion that could be radiated in the future if needed to.  6. Followup. In 8-10 weeks.    Zola Button, MD 3/29/20171:27 PM

## 2015-11-14 NOTE — Telephone Encounter (Signed)
Gave and printed appt shced and avs for pt for June °

## 2015-11-15 ENCOUNTER — Other Ambulatory Visit: Payer: Self-pay | Admitting: *Deleted

## 2015-11-15 LAB — PSA (PARALLEL TESTING): PSA: 3.66 ng/mL (ref <=4.00)

## 2015-11-15 LAB — PSA: PROSTATE SPECIFIC AG, SERUM: 3.7 ng/mL (ref 0.0–4.0)

## 2015-11-15 MED ORDER — CILOSTAZOL 100 MG PO TABS
100.0000 mg | ORAL_TABLET | Freq: Two times a day (BID) | ORAL | Status: DC
Start: 2015-11-15 — End: 2017-02-15

## 2015-11-16 MED FILL — ZYTIGA 250 MG TABLET: 250 | 30 days supply | Qty: 120 | Fill #1

## 2015-11-19 ENCOUNTER — Encounter (HOSPITAL_COMMUNITY): Payer: Medicare Other

## 2015-11-19 DIAGNOSIS — Z79899 Other long term (current) drug therapy: Secondary | ICD-10-CM | POA: Diagnosis not present

## 2015-11-19 DIAGNOSIS — I739 Peripheral vascular disease, unspecified: Secondary | ICD-10-CM | POA: Diagnosis not present

## 2015-11-19 DIAGNOSIS — E785 Hyperlipidemia, unspecified: Secondary | ICD-10-CM | POA: Diagnosis not present

## 2015-11-19 LAB — LIPID PANEL
CHOL/HDL RATIO: 3 ratio (ref <=5.0)
Cholesterol: 118 mg/dL — ABNORMAL LOW (ref 125–200)
HDL: 39 mg/dL — ABNORMAL LOW (ref >=40)
LDL CALC: 66 mg/dL (ref <130)
Triglycerides: 65 mg/dL (ref <150)
VLDL: 13 mg/dL (ref <30)

## 2015-11-19 LAB — HEPATIC FUNCTION PANEL
ALBUMIN: 3.6 g/dL (ref 3.6–5.1)
ALK PHOS: 86 U/L (ref 40–115)
ALT: 5 U/L — ABNORMAL LOW (ref 9–46)
AST: 9 U/L — AB (ref 10–35)
BILIRUBIN INDIRECT: 0.4 mg/dL (ref 0.2–1.2)
Bilirubin, Direct: 0.1 mg/dL (ref <=0.2)
TOTAL PROTEIN: 6.4 g/dL (ref 6.1–8.1)
Total Bilirubin: 0.5 mg/dL (ref 0.2–1.2)

## 2015-11-26 ENCOUNTER — Other Ambulatory Visit: Payer: Self-pay | Admitting: *Deleted

## 2015-11-26 MED ORDER — AMLODIPINE BESYLATE 5 MG PO TABS: 5.0000 mg | ORAL_TABLET | Freq: Every day | ORAL | Status: DC

## 2015-12-03 DIAGNOSIS — Z0000 Encounter for general adult medical examination without abnormal findings: Secondary | ICD-10-CM | POA: Diagnosis not present

## 2015-12-03 DIAGNOSIS — C61 Malignant neoplasm of prostate: Secondary | ICD-10-CM | POA: Diagnosis not present

## 2015-12-03 DIAGNOSIS — C7951 Secondary malignant neoplasm of bone: Secondary | ICD-10-CM | POA: Diagnosis not present

## 2015-12-05 ENCOUNTER — Inpatient Hospital Stay (HOSPITAL_COMMUNITY): Admission: RE | Admit: 2015-12-05 | Payer: Medicare Other | Source: Ambulatory Visit

## 2015-12-11 ENCOUNTER — Ambulatory Visit (HOSPITAL_COMMUNITY)
Admission: RE | Admit: 2015-12-11 | Discharge: 2015-12-11 | Disposition: A | Discharge status: Home / Self Care | Payer: Medicare Other | Source: Ambulatory Visit | Attending: Cardiovascular Disease | Admitting: Cardiovascular Disease

## 2015-12-11 DIAGNOSIS — I739 Peripheral vascular disease, unspecified: Secondary | ICD-10-CM | POA: Diagnosis not present

## 2015-12-11 DIAGNOSIS — Z79899 Other long term (current) drug therapy: Secondary | ICD-10-CM | POA: Diagnosis not present

## 2015-12-11 DIAGNOSIS — Z720 Tobacco use: Secondary | ICD-10-CM | POA: Diagnosis not present

## 2015-12-11 DIAGNOSIS — R938 Abnormal findings on diagnostic imaging of other specified body structures: Secondary | ICD-10-CM | POA: Diagnosis not present

## 2015-12-11 DIAGNOSIS — E785 Hyperlipidemia, unspecified: Secondary | ICD-10-CM | POA: Diagnosis not present

## 2015-12-17 MED FILL — ZYTIGA 250 MG TABLET: 250 | 30 days supply | Qty: 120 | Fill #2

## 2015-12-19 ENCOUNTER — Telehealth: Payer: Self-pay

## 2015-12-19 DIAGNOSIS — I739 Peripheral vascular disease, unspecified: Secondary | ICD-10-CM

## 2015-12-19 NOTE — Telephone Encounter (Signed)
-----   Message from Lorretta Harp, MD sent at 12/15/2015  3:26 PM EDT ----- No change from prior study. Repeat in 12 months.

## 2015-12-23 ENCOUNTER — Other Ambulatory Visit: Payer: Self-pay | Admitting: Cardiovascular Disease

## 2015-12-24 NOTE — Telephone Encounter (Signed)
Rx request sent to pharmacy.  

## 2016-01-03 ENCOUNTER — Encounter: Payer: Self-pay | Admitting: Oncology

## 2016-01-03 NOTE — Progress Notes (Signed)
Faxed j&j app again  [phone]

## 2016-01-17 ENCOUNTER — Other Ambulatory Visit: Payer: Self-pay | Admitting: Oncology

## 2016-01-17 MED FILL — ZYTIGA 250 MG TABLET: 250 | 30 days supply | Qty: 120 | Fill #0

## 2016-01-30 ENCOUNTER — Telehealth: Payer: Self-pay | Admitting: Oncology

## 2016-01-30 ENCOUNTER — Ambulatory Visit (HOSPITAL_BASED_OUTPATIENT_CLINIC_OR_DEPARTMENT_OTHER): Payer: Medicare Other | Admitting: Oncology

## 2016-01-30 ENCOUNTER — Other Ambulatory Visit (HOSPITAL_BASED_OUTPATIENT_CLINIC_OR_DEPARTMENT_OTHER): Payer: Medicare Other

## 2016-01-30 VITALS — BP 154/63 | HR 83 | Temp 98.5°F | Resp 18 | Ht 69.0 in | Wt 208.8 lb

## 2016-01-30 DIAGNOSIS — I6523 Occlusion and stenosis of bilateral carotid arteries: Secondary | ICD-10-CM

## 2016-01-30 DIAGNOSIS — C779 Secondary and unspecified malignant neoplasm of lymph node, unspecified: Secondary | ICD-10-CM | POA: Diagnosis not present

## 2016-01-30 DIAGNOSIS — C7951 Secondary malignant neoplasm of bone: Secondary | ICD-10-CM

## 2016-01-30 DIAGNOSIS — C61 Malignant neoplasm of prostate: Secondary | ICD-10-CM | POA: Diagnosis not present

## 2016-01-30 DIAGNOSIS — D649 Anemia, unspecified: Secondary | ICD-10-CM

## 2016-01-30 LAB — CBC WITH DIFFERENTIAL/PLATELET
BASO%: 0.3 % (ref 0.0–2.0)
BASOS ABS: 0 10*3/uL (ref 0.0–0.1)
EOS%: 1.9 % (ref 0.0–7.0)
Eosinophils Absolute: 0.1 10*3/uL (ref 0.0–0.5)
HEMATOCRIT: 38.5 % (ref 38.4–49.9)
HGB: 13.3 g/dL (ref 13.0–17.1)
LYMPH#: 3 10*3/uL (ref 0.9–3.3)
LYMPH%: 41.2 % (ref 14.0–49.0)
MCH: 31 pg (ref 27.2–33.4)
MCHC: 34.5 g/dL (ref 32.0–36.0)
MCV: 89.7 fL (ref 79.3–98.0)
MONO#: 0.8 10*3/uL (ref 0.1–0.9)
MONO%: 11 % (ref 0.0–14.0)
NEUT#: 3.4 10*3/uL (ref 1.5–6.5)
NEUT%: 45.6 % (ref 39.0–75.0)
PLATELETS: 189 10*3/uL (ref 140–400)
RBC: 4.29 10*6/uL (ref 4.20–5.82)
RDW: 14.8 % — ABNORMAL HIGH (ref 11.0–14.6)
WBC: 7.4 10*3/uL (ref 4.0–10.3)

## 2016-01-30 LAB — COMPREHENSIVE METABOLIC PANEL
ALT: <9 U/L (ref 0–55)
ANION GAP: 7 meq/L (ref 3–11)
AST: 9 U/L (ref 5–34)
Albumin: 3.2 g/dL — ABNORMAL LOW (ref 3.5–5.0)
Alkaline Phosphatase: 96 U/L (ref 40–150)
BUN: 11.6 mg/dL (ref 7.0–26.0)
CALCIUM: 8.9 mg/dL (ref 8.4–10.4)
CHLORIDE: 109 meq/L (ref 98–109)
CO2: 27 mEq/L (ref 22–29)
Creatinine: 1 mg/dL (ref 0.7–1.3)
Glucose: 117 mg/dl (ref 70–140)
POTASSIUM: 3.7 meq/L (ref 3.5–5.1)
Sodium: 143 mEq/L (ref 136–145)
Total Bilirubin: 0.67 mg/dL (ref 0.20–1.20)
Total Protein: 6.9 g/dL (ref 6.4–8.3)

## 2016-01-30 NOTE — Telephone Encounter (Signed)
per pofto sch pt app-gave pt copy of avs

## 2016-01-30 NOTE — Progress Notes (Signed)
Hematology and Oncology Follow Up Visit  Alec Carde [id number] 08/26/1945 70 y.o. 01/30/2016 10:16 AM No primary care provider on file.[name], [name], MD   Principle Diagnosis: 70- year old with Castration resistant prostate cancer. He was diagnosed in 2003 with Gleason score 4 + 4 = 8. Now he has metastatic disease with involvement of lymph nodes and bone.  Prior Therapy:   He underwent a prostatectomy with a pathology that showed prostatic adenocarcinoma. Gleason score 4+4 equals 8, with the tumor involving the margins and the pathologic staging was pT4 N1 with 1 of 2 left pelvic lymph nodes involved.  He was started on adjuvant hormone therapy with Lupron. He developed a rising PSA and bone metastasis.  He was then treated with ketoconazole and prednisone. He developed a rising PSA with a doubling time of just over 6 months. Bone scan in September 2013 showed increased uptake in the right lower lumbar spine at L4 and L5 and abnormal uptake in the mid sternum which had progressed since the prior bone scan.  Current therapy:  Zytiga 1000 mg daily started in October 2013.  Lupron given at at [facility] urology.  Interim History: Alec Harris presents today for a follow-up visit. Since his last visit, he reports no complaints. He continues to be an excellent health and reasonable quality of life. He does have some occasional arthralgias that resolved with little intervention. He denied any genitourinary complaints such as frequency urgency or hematuria. His appetite remain excellent and his weight is stable.  He continues to take Zytiga and have not reported any new side effects. He does not report any lower extremity edema or GI complications. He denied any lower extremity edema or electrolyte imbalance. He denied any easy bruisability.  He denies any headaches blurred vision or double vision. He denied any chest pain, palpitation or leg edema. He reports no nausea or vomiting. Denies hematuria  or other evidence of bleeding.  Has not reported any cough or hemoptysis or hematemesis. Does not report any hematochezia or melena. Does not report any frequency urgency or hesitancy. Does not report any rash or lesions. Does not report any petechiae. Remainder of his review of systems was unremarkable.  Medications: I have reviewed the patient's current medications. No changes on my review today. Current Outpatient Prescriptions  Medication Sig Dispense Refill  . amLODipine (NORVASC) 5 MG tablet Take 1 tablet (5 mg total) by mouth daily. 90 tablet 3  . aspirin EC 81 MG tablet Take 81 mg by mouth daily.    . Calcium Carb-Cholecalciferol (CALCIUM 500 +D) 500-400 MG-UNIT TABS Take 1 tablet by mouth 2 (two) times daily.    . cilostazol (PLETAL) 100 MG tablet Take 1 tablet (100 mg total) by mouth 2 (two) times daily. 60 tablet 11  . HYDROcodone-acetaminophen (NORCO) 10-325 MG tablet Take 1-2 tablets by mouth every 6 (six) hours as needed. 30 tablet 0  . rosuvastatin (CRESTOR) 10 MG tablet Take 1 tablet (10 mg total) by mouth daily. 30 tablet 6  . ZYTIGA 250 MG tablet TAKE 4 TABLETS BY MOUTH DAILY. TAKE ON AN EMPTY STOMACH 1 HOUR BEFORE OR 2 HOURS AFTER A MEAL 120 tablet 2   No current facility-administered medications for this visit.     Allergies: No Known Allergies   Physical Exam: Blood pressure 154/63, pulse 83, temperature 98.5 F (36.9 C), temperature source Oral, resp. rate 18, height 5\' 9"  (1.753 m), weight 208 lb 12.8 oz (94.711 kg), SpO2 98 %.  ECOG:  1 General appearance: Pleasant-appearing gentleman without distress. Head: Normocephalic, without obvious abnormality oral ulcers or lesions. Neck: no adenopathy.  Lymph nodes: Cervical, supraclavicular, and axillary nodes normal. Heart: Regular rate and rhythm with a systolic murmur auscultated. Lung:chest clear, no wheezing, rales, normal symmetric air entry. No dullness to percussion. Chest wall examination showed no  abnormalities noted on inspection.  Abdomen: soft, non-tender, without masses or organomegaly no shifting dullness or ascites. [id number] edema bilateral lower extremities Neurological examination: No deficits noted.  Lab Results: Lab Results  Component Value Date   WBC 7.4 01/30/2016   HGB 13.3 01/30/2016   HCT 38.5 01/30/2016   MCV 89.7 01/30/2016   PLT 189 01/30/2016     Chemistry      Component Value Date/Time   NA 144 11/14/2015 1307   NA 144 07/05/2013 0609   K 4.2 11/14/2015 1307   K 3.7 07/05/2013 0609   CL 106 07/05/2013 0609   CL 106 02/01/2013 1120   CO2 30* 11/14/2015 1307   CO2 27 04/09/2012 0500   BUN 11.2 11/14/2015 1307   BUN 11 07/05/2013 0609   CREATININE 0.9 11/14/2015 1307   CREATININE 1.00 07/05/2013 0609      Component Value Date/Time   CALCIUM 8.9 11/14/2015 1307   CALCIUM 8.6 04/09/2012 0500   ALKPHOS 86 11/19/2015 0001   ALKPHOS 80 11/14/2015 1307   AST 9* 11/19/2015 0001   AST 11 11/14/2015 1307   ALT 5* 11/19/2015 0001   ALT <9 11/14/2015 1307   BILITOT 0.5 11/19/2015 0001   BILITOT 0.66 11/14/2015 1307      Results for Alec Harris, Alec Harris (MRN [id number]) as of 01/30/2016 10:18  Ref. Range 09/11/2015 14:04 11/14/2015 13:07 11/14/2015 13:07  PSA Latest Ref Range: 0.0-4.0 ng/mL 4.0 3.66 3.7     Impression and Plan:  This is a 70 year old gentleman with the following issues:  1. Castration resistant prostate cancer with metastatic disease to the bone. He is status post multiple therapies in the past and most recently have been on Zytiga for the last 3 years.  He continues to tolerate Zytiga without any major complications. He continues to have excellent clinical response despite the slow rise in his PSA. The plan is to continue the same dose and schedule and uses different salvage therapy upon symptomatic progression. If his PSA also starts to rise rapidly with a doubling time less than 6 months, we will consider different salvage therapy as  well.  2. Androgen deprivation. He remains on Lupron given at [facility] urology every 4 months. I have recommended continuing this for the time being.  3. Bone directed therapy. Continue calcium and vitamin D supplements. Bone directed therapy will be considered and at this time he would like to defer this until later date.  4. Anemia: His hemoglobin is normal at this time.  5. Sternal pain: His continue be episodic and response to anastrozole anti-inflammatories.  6. Followup. In 8 weeks.    [facility], MD 6/14/201710:16 AM

## 2016-01-31 LAB — PSA: PROSTATE SPECIFIC AG, SERUM: 3.8 ng/mL (ref 0.0–4.0)

## 2016-02-18 MED FILL — ZYTIGA 250 MG TABLET: 250 | 30 days supply | Qty: 120 | Fill #1

## 2016-03-18 MED FILL — ZYTIGA 250 MG TABLET: 250 | 30 days supply | Qty: 120 | Fill #2

## 2016-04-02 ENCOUNTER — Telehealth: Payer: Self-pay | Admitting: Oncology

## 2016-04-02 ENCOUNTER — Other Ambulatory Visit (HOSPITAL_BASED_OUTPATIENT_CLINIC_OR_DEPARTMENT_OTHER): Payer: Medicare Other

## 2016-04-02 ENCOUNTER — Ambulatory Visit (HOSPITAL_BASED_OUTPATIENT_CLINIC_OR_DEPARTMENT_OTHER): Payer: Medicare Other | Admitting: Oncology

## 2016-04-02 VITALS — BP 161/59 | HR 63 | Temp 98.3°F | Resp 18 | Ht 69.0 in | Wt 208.4 lb

## 2016-04-02 DIAGNOSIS — C61 Malignant neoplasm of prostate: Secondary | ICD-10-CM

## 2016-04-02 DIAGNOSIS — D649 Anemia, unspecified: Secondary | ICD-10-CM

## 2016-04-02 DIAGNOSIS — I6523 Occlusion and stenosis of bilateral carotid arteries: Secondary | ICD-10-CM | POA: Diagnosis not present

## 2016-04-02 DIAGNOSIS — C775 Secondary and unspecified malignant neoplasm of intrapelvic lymph nodes: Secondary | ICD-10-CM

## 2016-04-02 DIAGNOSIS — C7951 Secondary malignant neoplasm of bone: Secondary | ICD-10-CM | POA: Diagnosis not present

## 2016-04-02 LAB — COMPREHENSIVE METABOLIC PANEL
ANION GAP: 7 meq/L (ref 3–11)
AST: 9 U/L (ref 5–34)
Albumin: 3.1 g/dL — ABNORMAL LOW (ref 3.5–5.0)
Alkaline Phosphatase: 98 U/L (ref 40–150)
BILIRUBIN TOTAL: 0.59 mg/dL (ref 0.20–1.20)
BUN: 12.3 mg/dL (ref 7.0–26.0)
CHLORIDE: 111 meq/L — AB (ref 98–109)
CO2: 24 meq/L (ref 22–29)
CREATININE: 0.9 mg/dL (ref 0.7–1.3)
Calcium: 8.9 mg/dL (ref 8.4–10.4)
EGFR: >90 mL/min/{1.73_m2} (ref >90)
Glucose: 131 mg/dl (ref 70–140)
Potassium: 3.6 mEq/L (ref 3.5–5.1)
Sodium: 142 mEq/L (ref 136–145)
TOTAL PROTEIN: 6.8 g/dL (ref 6.4–8.3)

## 2016-04-02 LAB — CBC WITH DIFFERENTIAL/PLATELET
BASO%: 0.7 % (ref 0.0–2.0)
Basophils Absolute: 0.1 10*3/uL (ref 0.0–0.1)
EOS%: 1.9 % (ref 0.0–7.0)
Eosinophils Absolute: 0.1 10*3/uL (ref 0.0–0.5)
HEMATOCRIT: 40.9 % (ref 38.4–49.9)
HGB: 13.7 g/dL (ref 13.0–17.1)
LYMPH#: 3 10*3/uL (ref 0.9–3.3)
LYMPH%: 40.3 % (ref 14.0–49.0)
MCH: 30.7 pg (ref 27.2–33.4)
MCHC: 33.4 g/dL (ref 32.0–36.0)
MCV: 92 fL (ref 79.3–98.0)
MONO#: 0.7 10*3/uL (ref 0.1–0.9)
MONO%: 9.5 % (ref 0.0–14.0)
NEUT%: 47.6 % (ref 39.0–75.0)
NEUTROS ABS: 3.6 10*3/uL (ref 1.5–6.5)
Platelets: 200 10*3/uL (ref 140–400)
RBC: 4.45 10*6/uL (ref 4.20–5.82)
RDW: 14.7 % — ABNORMAL HIGH (ref 11.0–14.6)
WBC: 7.6 10*3/uL (ref 4.0–10.3)

## 2016-04-02 NOTE — Progress Notes (Signed)
Hematology and Oncology Follow Up Visit  Alec Izer [id number] 1945/10/22 70 y.o. 04/02/2016 10:29 AM No primary care provider on file.No ref. provider found   Principle Diagnosis: 70 year old with Castration resistant prostate cancer. He was diagnosed in 2003 with Gleason score 4 + 4 = 8. Now he has metastatic disease with involvement of lymph nodes and bone.  Prior Therapy:   He underwent a prostatectomy with a pathology that showed prostatic adenocarcinoma. Gleason score 4+4 equals 8, with the tumor involving the margins and the pathologic staging was pT4 N1 with 1 of 2 left pelvic lymph nodes involved.  He was started on adjuvant hormone therapy with Lupron. He developed a rising PSA and bone metastasis.  He was then treated with ketoconazole and prednisone. He developed a rising PSA with a doubling time of just over 6 months. Bone scan in September 2013 showed increased uptake in the right lower lumbar spine at L4 and L5 and abnormal uptake in the mid sternum which had progressed since the prior bone scan.  Current therapy:  Zytiga 1000 mg daily started in October 2013.  Lupron given at at [facility] urology.  Interim History: Alec Harris presents today for a follow-up visit. Since his last visit, he continues to do very well without any major changes in his health. He denied any symptoms of fatigue or tiredness. He denied any bone pain. He denied any arthralgias or myalgias. He has reported periodic chest wall pain which has improved since the last visit. He does not take any pain medication at this time.  He continues to take Zytiga and have not reported any new side effects. He does not report any lower extremity edema or GI complications. He denied any lower extremity edema or electrolyte imbalance. He denied any easy bruisability. He reports no difficulties in obtaining this medication or taking it on a daily basis. He denied any missing doses.  He denies any headaches blurred vision or  double vision. He denied any chest pain, palpitation or leg edema. He reports no nausea or vomiting. Denies hematuria or other evidence of bleeding.  Has not reported any cough or hemoptysis or hematemesis. Does not report any hematochezia or melena. Does not report any frequency urgency or hesitancy. Does not report any rash or lesions. Does not report any petechiae. Remainder of his review of systems was unremarkable.  Medications: I have reviewed the patient's current medications. No changes on my review today. Current Outpatient Prescriptions  Medication Sig Dispense Refill  . amLODipine (NORVASC) 5 MG tablet Take 1 tablet (5 mg total) by mouth daily. 90 tablet 3  . aspirin EC 81 MG tablet Take 81 mg by mouth daily.    . Calcium Carb-Cholecalciferol (CALCIUM 500 +D) 500-400 MG-UNIT TABS Take 1 tablet by mouth 2 (two) times daily.    . cilostazol (PLETAL) 100 MG tablet Take 1 tablet (100 mg total) by mouth 2 (two) times daily. 60 tablet 11  . HYDROcodone-acetaminophen (NORCO) 10-325 MG tablet Take 1-2 tablets by mouth every 6 (six) hours as needed. 30 tablet 0  . rosuvastatin (CRESTOR) 10 MG tablet Take 1 tablet (10 mg total) by mouth daily. 30 tablet 6  . ZYTIGA 250 MG tablet TAKE 4 TABLETS BY MOUTH DAILY. TAKE ON AN EMPTY STOMACH 1 HOUR BEFORE OR 2 HOURS AFTER A MEAL 120 tablet 2   No current facility-administered medications for this visit.      Allergies: No Known Allergies   Physical Exam: Blood pressure (!) 161/59,  pulse 63, temperature 98.3 F (36.8 C), temperature source Oral, resp. rate 18, height 5\' 9"  (1.753 m), weight 208 lb 6.4 oz (94.5 kg), SpO2 97 %.  ECOG: 1 General appearance: Alert, awake gentleman without distress. Head: Normocephalic, without obvious abnormality no oral thrush noted. Neck: no adenopathy.  Lymph nodes: Cervical, supraclavicular, and axillary nodes normal. Heart: Regular rate and rhythm with a systolic murmur auscultated. Lung:chest clear, no  wheezing, rales, normal symmetric air entry. Chest wall examination showed no abnormalities noted on inspection. Pain elicited with deep palpation. Abdomen: soft, non-tender, without masses or organomegaly no rebound or guarding. [id number] edema bilateral lower extremities Neurological examination: No deficits noted.  Lab Results: Lab Results  Component Value Date   WBC 7.6 04/02/2016   HGB 13.7 04/02/2016   HCT 40.9 04/02/2016   MCV 92.0 04/02/2016   PLT 200 04/02/2016     Chemistry      Component Value Date/Time   NA 143 01/30/2016 0952   K 3.7 01/30/2016 0952   CL 106 07/05/2013 0609   CL 106 02/01/2013 1120   CO2 27 01/30/2016 0952   BUN 11.6 01/30/2016 0952   CREATININE 1.0 01/30/2016 0952      Component Value Date/Time   CALCIUM 8.9 01/30/2016 0952   ALKPHOS 96 01/30/2016 0952   AST 9 01/30/2016 0952   ALT <9 01/30/2016 0952   BILITOT 0.67 01/30/2016 0952     Results for Alec Harris (MRN [id number]) as of 04/02/2016 10:23  Ref. Range 11/14/2015 13:07 11/14/2015 13:07 01/30/2016 09:51  PSA Latest Ref Range: 0.0 - 4.0 ng/mL 3.66 3.7 3.8       Impression and Plan:  This is a 70 year old gentleman with the following issues:  1. Castration resistant prostate cancer with metastatic disease to the bone. He is status post multiple therapies in the past and most recently have been on [place] Since October 2013.  His PSA continues to be reasonably controlled without any dramatic increase. His PSA has been around for since 2016. The plan is to continue with the same dose and schedule and restage him with a CT scan and a bone scan and consider salvage therapy if his PSA starts to rise rapidly.  2. Androgen deprivation. He remains on Lupron given at [facility] urology every 4 months. I have recommended continuing this for the time being. He is scheduled to have that done next week under the care of Dr. Louis Harris.  3. Bone directed therapy. Continue calcium and vitamin D  supplements. Bone directed therapy will be considered and at this time he would like to defer this until later date.  4. Anemia: This have resolved at this time and his hemoglobin is within normal range.  5. Sternal pain: His continue be episodic and response to anti-inflammatories. His pain has improved since the last visit.  6. Followup. In 8 weeks.    [facility], MD 8/16/201710:29 AM

## 2016-04-02 NOTE — Telephone Encounter (Signed)
GAVE PATIENT AVS REPORT AND APPOINTMENTS FOR October  °

## 2016-04-03 LAB — PSA: PROSTATE SPECIFIC AG, SERUM: 3.8 ng/mL (ref 0.0–4.0)

## 2016-04-07 DIAGNOSIS — C61 Malignant neoplasm of prostate: Secondary | ICD-10-CM | POA: Diagnosis not present

## 2016-04-15 ENCOUNTER — Other Ambulatory Visit: Payer: Self-pay | Admitting: Oncology

## 2016-04-15 MED FILL — ZYTIGA 250 MG TABLET: 250 | 30 days supply | Qty: 120 | Fill #0

## 2016-05-16 MED FILL — ZYTIGA 250 MG TABLET: 250 | 30 days supply | Qty: 120 | Fill #1

## 2016-05-28 ENCOUNTER — Other Ambulatory Visit (HOSPITAL_COMMUNITY)
Admission: RE | Admit: 2016-05-28 | Discharge: 2016-05-28 | Disposition: A | Discharge status: Home / Self Care | Payer: Medicare Other | Source: Ambulatory Visit | Attending: Oncology | Admitting: Oncology

## 2016-05-28 ENCOUNTER — Other Ambulatory Visit (HOSPITAL_BASED_OUTPATIENT_CLINIC_OR_DEPARTMENT_OTHER): Payer: Medicare Other

## 2016-05-28 ENCOUNTER — Telehealth: Payer: Self-pay | Admitting: Oncology

## 2016-05-28 ENCOUNTER — Ambulatory Visit (HOSPITAL_BASED_OUTPATIENT_CLINIC_OR_DEPARTMENT_OTHER): Payer: Medicare Other | Admitting: Oncology

## 2016-05-28 VITALS — BP 155/50 | HR 79 | Temp 98.3°F | Resp 19 | Wt 204.3 lb

## 2016-05-28 DIAGNOSIS — C61 Malignant neoplasm of prostate: Secondary | ICD-10-CM | POA: Diagnosis not present

## 2016-05-28 DIAGNOSIS — C7951 Secondary malignant neoplasm of bone: Secondary | ICD-10-CM | POA: Diagnosis not present

## 2016-05-28 DIAGNOSIS — I6523 Occlusion and stenosis of bilateral carotid arteries: Secondary | ICD-10-CM

## 2016-05-28 DIAGNOSIS — C775 Secondary and unspecified malignant neoplasm of intrapelvic lymph nodes: Secondary | ICD-10-CM

## 2016-05-28 LAB — CBC WITH DIFFERENTIAL/PLATELET
BASO%: 0.3 % (ref 0.0–2.0)
BASOS ABS: 0 10*3/uL (ref 0.0–0.1)
EOS%: 1.3 % (ref 0.0–7.0)
Eosinophils Absolute: 0.1 10*3/uL (ref 0.0–0.5)
HEMATOCRIT: 39 % (ref 38.4–49.9)
HGB: 13.6 g/dL (ref 13.0–17.1)
LYMPH#: 3.1 10*3/uL (ref 0.9–3.3)
LYMPH%: 42.9 % (ref 14.0–49.0)
MCH: 30.9 pg (ref 27.2–33.4)
MCHC: 34.9 g/dL (ref 32.0–36.0)
MCV: 88.6 fL (ref 79.3–98.0)
MONO#: 0.7 10*3/uL (ref 0.1–0.9)
MONO%: 9.2 % (ref 0.0–14.0)
NEUT#: 3.3 10*3/uL (ref 1.5–6.5)
NEUT%: 46.3 % (ref 39.0–75.0)
Platelets: 184 10*3/uL (ref 140–400)
RBC: 4.4 10*6/uL (ref 4.20–5.82)
RDW: 15.1 % — ABNORMAL HIGH (ref 11.0–14.6)
WBC: 7.2 10*3/uL (ref 4.0–10.3)

## 2016-05-28 LAB — COMPREHENSIVE METABOLIC PANEL
ALT: 8 U/L — AB (ref 17–63)
AST: 11 U/L — AB (ref 15–41)
Albumin: 3.6 g/dL (ref 3.5–5.0)
Alkaline Phosphatase: 82 U/L (ref 38–126)
Anion gap: 8 (ref 5–15)
BUN: 10 mg/dL (ref 6–20)
CHLORIDE: 107 mmol/L (ref 101–111)
CO2: 28 mmol/L (ref 22–32)
CREATININE: 0.88 mg/dL (ref 0.61–1.24)
Calcium: 8.7 mg/dL — ABNORMAL LOW (ref 8.9–10.3)
GFR calc Af Amer: >60 mL/min (ref >60)
GFR calc non Af Amer: >60 mL/min (ref >60)
Glucose, Bld: 139 mg/dL — ABNORMAL HIGH (ref 65–99)
POTASSIUM: 3.1 mmol/L — AB (ref 3.5–5.1)
SODIUM: 143 mmol/L (ref 135–145)
Total Bilirubin: 1.1 mg/dL (ref 0.3–1.2)
Total Protein: 7.1 g/dL (ref 6.5–8.1)

## 2016-05-28 NOTE — Telephone Encounter (Signed)
Avs report and appointment schedule given to patient, per 05/28/16 los. ° °

## 2016-05-28 NOTE — Progress Notes (Signed)
Hematology and Oncology Follow Up Visit  Alec Martenson [id number] 13-Jan-1946 70 y.o. 05/28/2016 1:19 PM No PCP Per PatientNo ref. provider found   Principle Diagnosis: 70 year old with Castration resistant prostate cancer. He was diagnosed in 2003 with Gleason score 4 + 4 = 8. Now he has metastatic disease with involvement of lymph nodes and bone.  Prior Therapy:   He underwent a prostatectomy with a pathology that showed prostatic adenocarcinoma. Gleason score 4+4 equals 8, with the tumor involving the margins and the pathologic staging was pT4 N1 with 1 of 2 left pelvic lymph nodes involved.  He was started on adjuvant hormone therapy with Lupron. He developed a rising PSA and bone metastasis.  He was then treated with ketoconazole and prednisone. He developed a rising PSA with a doubling time of just over 6 months. Bone scan in September 2013 showed increased uptake in the right lower lumbar spine at L4 and L5 and abnormal uptake in the mid sternum which had progressed since the prior bone scan.  Current therapy:  Zytiga 1000 mg daily started in October 2013.  [facility] given at at [facility].  Interim History: Alec Harris presents today for a follow-up visit. Since his last visit, he reports no changes in his health. He continues to be reasonably active and attends to activities of daily living. He just or from the [place] area where he visits family there. He denied any pain or discomfort since last visit. He denied any symptoms of fatigue or tiredness. He denied any bone pain. He denied any arthralgias or myalgias. He does not take any pain medication at this time.  He continues to take Zytiga and have not reported any new side effects. He does not report any lower extremity edema or GI complications. He does not report any thrombosis of bleeding episodes. He has no difficulties obtaining or swallowing the medication.  He denies any headaches blurred vision or double vision. He  denied any chest pain, palpitation or leg edema. He reports no nausea or vomiting. Denies hematuria or other evidence of bleeding.  Has not reported any cough or hemoptysis or hematemesis. Does not report any hematochezia or melena. Does not report any frequency urgency or hesitancy. Does not report any rash or lesions. Does not report any petechiae. Remainder of his review of systems was unremarkable.  Medications: I have reviewed the patient's current medications. No changes on my review today. Current Outpatient Prescriptions  Medication Sig Dispense Refill  . amLODipine (NORVASC) 5 MG tablet Take 1 tablet (5 mg total) by mouth daily. 90 tablet 3  . aspirin EC 81 MG tablet Take 81 mg by mouth daily.    . Calcium Carb-Cholecalciferol (CALCIUM 500 +D) 500-400 MG-UNIT TABS Take 1 tablet by mouth 2 (two) times daily.    . cilostazol (PLETAL) 100 MG tablet Take 1 tablet (100 mg total) by mouth 2 (two) times daily. 60 tablet 11  . HYDROcodone-acetaminophen (NORCO) 10-325 MG tablet Take 1-2 tablets by mouth every 6 (six) hours as needed. 30 tablet 0  . rosuvastatin (CRESTOR) 10 MG tablet Take 1 tablet (10 mg total) by mouth daily. 30 tablet 6  . ZYTIGA 250 MG tablet TAKE 4 TABLETS BY MOUTH DAILY. TAKE ON AN EMPTY STOMACH 1 HOUR BEFORE OR 2 HOURS AFTER A MEAL 120 tablet 2   No current facility-administered medications for this visit.      Allergies: No Known Allergies   Physical Exam: Blood pressure (!) 155/50, pulse 79, temperature  98.3 F (36.8 C), temperature source Oral, resp. rate 19, weight 204 lb 4.8 oz (92.7 kg), SpO2 100 %.  ECOG: 1 General appearance: Well-appearing gentleman without distress. Head: Normocephalic, without obvious abnormality no oral thrush noted. Neck: no adenopathy.  Lymph nodes: Cervical, supraclavicular, and axillary nodes normal. Heart: Regular rate and rhythm with a systolic murmur auscultated. Lung:chest clear, no wheezing, rales, normal symmetric air  entry. Chest wall examination showed no abnormalities noted on inspection. No pain on deep palpation. Abdomen: soft, non-tender, without masses or organomegaly no shifting dullness or ascites. [id number] edema bilateral lower extremities Neurological examination: No deficits noted.  Lab Results: Lab Results  Component Value Date   WBC 7.2 05/28/2016   HGB 13.6 05/28/2016   HCT 39.0 05/28/2016   MCV 88.6 05/28/2016   PLT 184 05/28/2016     Chemistry      Component Value Date/Time   NA 142 04/02/2016 0958   K 3.6 04/02/2016 0958   CL 106 07/05/2013 0609   CL 106 02/01/2013 1120   CO2 24 04/02/2016 0958   BUN 12.3 04/02/2016 0958   CREATININE 0.9 04/02/2016 0958      Component Value Date/Time   CALCIUM 8.9 04/02/2016 0958   ALKPHOS 98 04/02/2016 0958   AST 9 04/02/2016 0958   ALT <9 04/02/2016 0958   BILITOT 0.59 04/02/2016 0958      Results for Alec Harris, Alec Harris (MRN [id number]) as of 05/28/2016 13:13  Ref. Range 11/14/2015 13:07 11/14/2015 13:07 01/30/2016 09:51 04/02/2016 09:58  PSA Latest Ref Range: 0.0 - 4.0 ng/mL 3.66 3.7 3.8 3.8       Impression and Plan:  This is a 70 year old gentleman with the following issues:  1. Castration resistant prostate cancer with metastatic disease to the bone. He is status post multiple therapies in the past and most recently have been on [place] Since October 2013.  His PSA continues to be Stable in the last year or so close to 4. The plan is to continue with the same dose and schedule this medication and restaging with a bone scan in January 2018. Different salvage therapy will be considered upon symptomatic progression.  2. Androgen deprivation. He remains on Lupron given at [facility] urology every 4 months. I I recommend continuing this indefinitely.  3. Bone directed therapy. Continue calcium and vitamin D supplements. Bone directed therapy will be considered and at this time he would like to defer this until later date. If his bone  disease increases and his next bone scan we will consider that option after dental clearance.  4. Anemia: This have resolved at this time and his hemoglobin is within normal range.  5. Sternal pain: His continue be episodic and response to anti-inflammatories. This have resolved but does appear intermittently.  6. Followup. In 12 weeks.    [id number], MD 10/11/20171:19 PM

## 2016-05-29 LAB — PSA: PROSTATE SPECIFIC AG, SERUM: 4.8 ng/mL — AB (ref 0.0–4.0)

## 2016-06-16 MED FILL — ZYTIGA 250 MG TABLET: 250 | 30 days supply | Qty: 120 | Fill #2

## 2016-07-15 ENCOUNTER — Other Ambulatory Visit: Payer: Self-pay | Admitting: Oncology

## 2016-07-16 ENCOUNTER — Telehealth: Payer: Self-pay

## 2016-07-16 NOTE — Telephone Encounter (Signed)
Received a call from [name] stating his refill at [facility] for Alec [facility] could not be done there anymore.  Upon investigation his enrollment in the [employer] drug assistance program was marked inactive.  Per [employer] they are in need of his tax records for 2016 before they can reactivate his account.  I contacted [name] as to this and he brought in his tax records at which time I made a copy and faxed to [phone].  I informed him to contact us Friday [phone] to ascertain his status with [employer].  He acknowledged and we will call [employer] Friday to check on the status.  Thank you  Henreitta Leber, PharmD [facility]

## 2016-07-18 ENCOUNTER — Telehealth: Payer: Self-pay | Admitting: Pharmacist

## 2016-07-18 NOTE — Telephone Encounter (Signed)
Oral Chemotherapy Pharmacist Encounter  I called [employer] to ensure they had received tax documentation faxed to them on 07/17/16. They had received it and were able to re-activate patient's account while on the phone with me. [facility] (pharmacy that supplies Zytiga for [employer]) will reach out to the patient for delivery of his next Zytiga fill.  I called patient and informed him of the good news. I was not able to tell Mr. Dempewolf when he would hear from this pharmacy but did advise patient to reach back out to [place] next week if he had not heard anything by mid-week.  Oral Oncology Clinic will sign off at this time. Please let us know if we can be of assistance in the future.  Johny Drilling, PharmD, BCPS, BCOP 07/18/2016  4:03 PM Oral Oncology Clinic [phone]

## 2016-07-23 ENCOUNTER — Telehealth: Payer: Self-pay | Admitting: Medical Oncology

## 2016-07-23 ENCOUNTER — Other Ambulatory Visit: Payer: Self-pay | Admitting: *Deleted

## 2016-07-23 MED ORDER — ABIRATERONE ACETATE 250 MG PO TABS
1000.0000 mg | ORAL_TABLET | Freq: Every day | ORAL | 2 refills | Status: DC
Start: 2016-07-23 — End: 2016-08-19

## 2016-07-23 NOTE — Telephone Encounter (Signed)
Needs from completed and send med list. I gave [name]'s fax number to caller.

## 2016-08-04 ENCOUNTER — Other Ambulatory Visit: Payer: Self-pay | Admitting: Cardiovascular Disease

## 2016-08-19 ENCOUNTER — Other Ambulatory Visit: Payer: Self-pay | Admitting: *Deleted

## 2016-08-19 ENCOUNTER — Telehealth: Payer: Self-pay | Admitting: *Deleted

## 2016-08-19 DIAGNOSIS — C61 Malignant neoplasm of prostate: Secondary | ICD-10-CM | POA: Diagnosis not present

## 2016-08-19 MED ORDER — ABIRATERONE ACETATE 250 MG PO TABS: 1000.0000 mg | ORAL_TABLET | Freq: Every day | ORAL | 2 refills | Status: DC

## 2016-08-19 NOTE — Telephone Encounter (Signed)
Pt states needs refill on Zytiga from Dr. [name] Blew faxed to his pharmacy.  Asked pt which pharmacy and he was not sure, but it looks like it is from [facility] for [employer] and [employer].  Pt says he has 9 days left of medication.  Pt requests call back when Rx is faxed.  He can be reached at [phone].

## 2016-08-19 NOTE — Telephone Encounter (Signed)
Ok to refill 

## 2016-08-21 ENCOUNTER — Ambulatory Visit (HOSPITAL_COMMUNITY)
Admission: RE | Admit: 2016-08-21 | Discharge: 2016-08-21 | Disposition: A | Discharge status: Home / Self Care | Payer: Medicare Other | Source: Ambulatory Visit | Attending: Oncology | Admitting: Oncology

## 2016-08-21 ENCOUNTER — Other Ambulatory Visit (HOSPITAL_BASED_OUTPATIENT_CLINIC_OR_DEPARTMENT_OTHER): Payer: Medicare Other

## 2016-08-21 DIAGNOSIS — C61 Malignant neoplasm of prostate: Secondary | ICD-10-CM | POA: Diagnosis not present

## 2016-08-21 LAB — CBC WITH DIFFERENTIAL/PLATELET
BASO%: 0.3 % (ref 0.0–2.0)
BASOS ABS: 0 10*3/uL (ref 0.0–0.1)
EOS ABS: 0.1 10*3/uL (ref 0.0–0.5)
EOS%: 1.4 % (ref 0.0–7.0)
HCT: 36.5 % — ABNORMAL LOW (ref 38.4–49.9)
HEMOGLOBIN: 12.5 g/dL — AB (ref 13.0–17.1)
LYMPH%: 35.6 % (ref 14.0–49.0)
MCH: 30.3 pg (ref 27.2–33.4)
MCHC: 34.2 g/dL (ref 32.0–36.0)
MCV: 88.6 fL (ref 79.3–98.0)
MONO#: 0.6 10*3/uL (ref 0.1–0.9)
MONO%: 8.5 % (ref 0.0–14.0)
NEUT#: 3.8 10*3/uL (ref 1.5–6.5)
NEUT%: 54.2 % (ref 39.0–75.0)
Platelets: 184 10*3/uL (ref 140–400)
RBC: 4.12 10*6/uL — AB (ref 4.20–5.82)
RDW: 15.2 % — ABNORMAL HIGH (ref 11.0–14.6)
WBC: 6.9 10*3/uL (ref 4.0–10.3)
lymph#: 2.5 10*3/uL (ref 0.9–3.3)

## 2016-08-21 LAB — COMPREHENSIVE METABOLIC PANEL
ALK PHOS: 110 U/L (ref 40–150)
ALT: <6 U/L (ref 0–55)
AST: 8 U/L (ref 5–34)
Albumin: 3.1 g/dL — ABNORMAL LOW (ref 3.5–5.0)
Anion Gap: 9 mEq/L (ref 3–11)
BUN: 9.9 mg/dL (ref 7.0–26.0)
CHLORIDE: 110 meq/L — AB (ref 98–109)
CO2: 24 meq/L (ref 22–29)
Calcium: 8.8 mg/dL (ref 8.4–10.4)
Creatinine: 0.8 mg/dL (ref 0.7–1.3)
GLUCOSE: 138 mg/dL (ref 70–140)
POTASSIUM: 3.1 meq/L — AB (ref 3.5–5.1)
SODIUM: 143 meq/L (ref 136–145)
Total Bilirubin: 0.85 mg/dL (ref 0.20–1.20)
Total Protein: 6.5 g/dL (ref 6.4–8.3)

## 2016-08-21 MED ORDER — TECHNETIUM TC 99M MEDRONATE IV KIT
21.0000 | PACK | Freq: Once | INTRAVENOUS | Status: AC | PRN
  Administered 2016-08-21: 21 via INTRAVENOUS

## 2016-08-22 LAB — PSA: PROSTATE SPECIFIC AG, SERUM: 4.4 ng/mL — AB (ref 0.0–4.0)

## 2016-08-28 ENCOUNTER — Ambulatory Visit (HOSPITAL_BASED_OUTPATIENT_CLINIC_OR_DEPARTMENT_OTHER): Payer: Medicare Other | Admitting: Oncology

## 2016-08-28 ENCOUNTER — Telehealth: Payer: Self-pay | Admitting: Pharmacist

## 2016-08-28 ENCOUNTER — Telehealth: Payer: Self-pay | Admitting: Oncology

## 2016-08-28 VITALS — BP 135/67 | HR 84 | Temp 98.3°F | Resp 18 | Ht 69.0 in | Wt 203.5 lb

## 2016-08-28 DIAGNOSIS — C61 Malignant neoplasm of prostate: Secondary | ICD-10-CM | POA: Diagnosis not present

## 2016-08-28 DIAGNOSIS — C779 Secondary and unspecified malignant neoplasm of lymph node, unspecified: Secondary | ICD-10-CM | POA: Diagnosis not present

## 2016-08-28 DIAGNOSIS — C7951 Secondary malignant neoplasm of bone: Secondary | ICD-10-CM

## 2016-08-28 NOTE — Telephone Encounter (Signed)
Gave patient avs report and appointments for April  °

## 2016-08-28 NOTE — Telephone Encounter (Signed)
Oral Chemotherapy Pharmacist Encounter  Received notification from [facility], RN that patient was out of Zytiga and had not heard from pharmacy about next refill. Noted prescription had been faxed to [place] ([place] on 08/19/16.  I called [facility] at [phone] for status of prescription. They confirmed receipt of prescription on 1/2 but had not processed it yet, waiting on 2018 [employer] renewal. They had taken no actions to notify patient or office that renewal was needed. They suggested I call [employer] to find out next steps.  I called [employer] at [phone] to inquire about patient's enrollment. [employer] stated patient is actively enrolled for free medication through their program until 10/04/16. [employer] called [facility] while I was on hold to alert them patient's prescription should be processed. Per [employer], the prescription should be processed this afternoon and [facility] should be reaching out to the patient today or tomorrow for shipping and delivery information.  I called [facility] back to check on prescription processing. At 11:40am was informed prescription was good to be processed and they will be reaching out to the patient shortly to schedule delivery.  Amelia Jo, RN relayed information about dates of enrollment and prescription processing to patient.  Oral Oncology Clinic will sign-off at this time. Please let us know if we can be of assistance in the future.  Johny Drilling, PharmD, BCPS, BCOP 08/28/2016  11:27 AM Oral Oncology Clinic [phone]

## 2016-08-28 NOTE — Progress Notes (Signed)
Hematology and Oncology Follow Up Visit  Alec Harris [id number] [birth year] 71 y.o. 08/28/2016 10:24 AM No PCP Per PatientNo ref. provider found   Principle Diagnosis: 71 year old with Castration resistant prostate cancer. He was diagnosed in 2003 with Gleason score 4 + 4 = 8. Now he has metastatic disease with involvement of lymph nodes and bone.  Prior Therapy:   He underwent a prostatectomy with a pathology that showed prostatic adenocarcinoma. Gleason score 4+4 equals 8, with the tumor involving the margins and the pathologic staging was pT4 N1 with 1 of 2 left pelvic lymph nodes involved.  He was started on adjuvant hormone therapy with Lupron. He developed a rising PSA and bone metastasis.  He was then treated with ketoconazole and prednisone. He developed a rising PSA with a doubling time of just over 6 months. Bone scan in September 2013 showed increased uptake in the right lower lumbar spine at L4 and L5 and abnormal uptake in the mid sternum which had progressed since the prior bone scan.  Current therapy:  Zytiga 1000 mg daily started in October 2013.  Lupron given at at [facility] urology.  Interim History: Alec Harris presents today for a follow-up visit. Since his last visit, he reports no recent complaints. He denied any recent hospitalization or illnesses. Has not reported any decline in his performance status or activity level. He denied any pain or discomfort since last visit. He denied any symptoms of fatigue or tiredness. He denied any bone pain. He denied any arthralgias or myalgias. He does not take any pain medication at this time.  He continues to take Zytiga and have not reported any new side effects. He does not report any lower extremity edema or GI complications. He does not report any thrombosis of bleeding episodes. He reports he is already out of this medication and has not been refilled for him recently.  He denies any headaches blurred vision or double vision. He  denied any chest pain, palpitation or leg edema. He reports no nausea or vomiting. Denies hematuria or other evidence of bleeding.  Has not reported any cough or hemoptysis or hematemesis. Does not report any hematochezia or melena. Does not report any frequency urgency or hesitancy. Does not report any rash or lesions. Does not report any petechiae. Remainder of his review of systems was unremarkable.  Medications: I have reviewed the patient's current medications. No changes on my review today. Current Outpatient Prescriptions  Medication Sig Dispense Refill  . abiraterone Acetate (ZYTIGA) 250 MG tablet Take 4 tablets (1,000 mg total) by mouth daily. Take on an empty stomach 1 hour before or 2 hours after a meal 120 tablet 2  . amLODipine (NORVASC) 5 MG tablet Take 1 tablet (5 mg total) by mouth daily. 90 tablet 3  . aspirin EC 81 MG tablet Take 81 mg by mouth daily.    . Calcium Carb-Cholecalciferol (CALCIUM 500 +D) 500-400 MG-UNIT TABS Take 1 tablet by mouth 2 (two) times daily.    . cilostazol (PLETAL) 100 MG tablet Take 1 tablet (100 mg total) by mouth 2 (two) times daily. 60 tablet 11  . HYDROcodone-acetaminophen (NORCO) 10-325 MG tablet Take 1-2 tablets by mouth every 6 (six) hours as needed. 30 tablet 0  . rosuvastatin (CRESTOR) 10 MG tablet TAKE 1 TABLET (10 MG TOTAL) BY MOUTH DAILY. 30 tablet 6   No current facility-administered medications for this visit.      Allergies: No Known Allergies   Physical Exam: Blood pressure 135/67,  pulse 84, temperature 98.3 F (36.8 C), temperature source Oral, resp. rate 18, height 5\' 9"  (1.753 m), weight 203 lb 8 oz (92.3 kg), SpO2 99 %.  ECOG: 1 General appearance: Alert, awake gentleman without distress. Head: Normocephalic, without obvious abnormality no oral ulcers or lesions. Neck: no adenopathy.  Lymph nodes: Cervical, supraclavicular, and axillary nodes normal. Heart: Regular rate and rhythm with a systolic murmur  auscultated. Lung:chest clear, no wheezing, rales, normal symmetric air entry. Chest wall examination showed no abnormalities noted on inspection. No pain on deep palpation. Abdomen: soft, non-tender, without masses or organomegaly no shifting dullness or ascites. [id number] edema bilateral lower extremities Neurological examination: No deficits noted.  Lab Results: Lab Results  Component Value Date   WBC 6.9 08/21/2016   HGB 12.5 (L) 08/21/2016   HCT 36.5 (L) 08/21/2016   MCV 88.6 08/21/2016   PLT 184 08/21/2016     Chemistry      Component Value Date/Time   NA 143 08/21/2016 0935   K 3.1 (L) 08/21/2016 0935   CL 107 05/28/2016 1355   CL 106 02/01/2013 1120   CO2 24 08/21/2016 0935   BUN 9.9 08/21/2016 0935   CREATININE 0.8 08/21/2016 0935      Component Value Date/Time   CALCIUM 8.8 08/21/2016 0935   ALKPHOS 110 08/21/2016 0935   AST 8 08/21/2016 0935   ALT <6 08/21/2016 0935   BILITOT 0.85 08/21/2016 0935     Results for Alec Harris (MRN [id number]) as of 08/28/2016 09:46  Ref. Range 04/02/2016 09:58 05/28/2016 12:59 08/21/2016 09:35  PSA Latest Ref Range: 0.0 - 4.0 ng/mL 3.8 4.8 (H) 4.4 (H)    EXAM: NUCLEAR MEDICINE WHOLE BODY BONE SCAN  TECHNIQUE: Whole body anterior and posterior images were obtained approximately 3 hours after intravenous injection of radiopharmaceutical.  RADIOPHARMACEUTICALS:  21.0 mCi Technetium-107m MDP IV  COMPARISON:  09/11/2015.  FINDINGS: Bilateral renal function excretion. Persistent intense increased activity noted about the sternum. New intense focus of activity noted in a mid thoracic vertebral body. New focal intense area of increased activity noted over a left mid posterior rib. Mild multifocal areas of slight increased activity noted throughout the remainder of the thoracic and lumbar spine. New area of focal intense increased activity right mid humerus. These findings are consistent with progressive metastatic  disease.  IMPRESSION: Findings consist with progressive metastatic disease.     Impression and Plan:  This is a 71 year old gentleman with the following issues:  1. Castration resistant prostate cancer with metastatic disease to the bone. He is status post multiple therapies in the past and most recently have been on [place] Since October 2013.  His PSA on 08/21/2016 was 4.4 which is stable compared to his previous PSAs. His bone scan was also reviewed and not dramatically different.  The risks and benefits of continuing Zytiga at this time were reviewed and is agreeable to do so. Different salvage regimen will be used upon symptomatically progression.  2. Androgen deprivation. He remains on Lupron given at [facility] urology every 4 months. I  recommend continuing this indefinitely.  3. Bone directed therapy. Continue calcium and vitamin D supplements. Bone directed therapy will be considered in the future. He has declined that in the past.  4. Anemia: This have resolved at this time and his hemoglobin is less than normal range.  5. Sternal pain: His continue be episodic and response to anti-inflammatories. This have resolved but does appear intermittently.  6. Followup. In 3 months.  [facility], MD 1/11/201810:24 AM

## 2016-09-10 ENCOUNTER — Telehealth: Payer: Self-pay | Admitting: Pharmacist

## 2016-09-10 NOTE — Telephone Encounter (Signed)
I s/w pt over phone re: renewal for [place] for [employer]. Pt will come tomorrow around 10 am to sign application & bring in financials. Once he's signed forms, we'll fax to [employer].  Kennith Center, Pharm.D., CPP 09/10/2016@4 :25 PM Oral chemo Clinic

## 2016-09-11 ENCOUNTER — Encounter: Payer: Self-pay | Admitting: Pharmacist

## 2016-09-11 NOTE — Progress Notes (Signed)
Pt completed application & provided [id number] fed tax forms. Application for [employer] has been faxed. Pt has 2 week supply of Zytiga remaining. We will contact pt when we hear back from [employer].  Kennith Center, Pharm.D., CPP 09/11/2016@11 :04 AM Oral Chemo Clinic

## 2016-09-15 ENCOUNTER — Encounter: Payer: Self-pay | Admitting: Family

## 2016-09-17 ENCOUNTER — Other Ambulatory Visit: Payer: Self-pay | Admitting: *Deleted

## 2016-09-17 MED ORDER — ABIRATERONE ACETATE 250 MG PO TABS: 1000.0000 mg | ORAL_TABLET | Freq: Every day | ORAL | 2 refills | Status: DC

## 2016-09-22 ENCOUNTER — Ambulatory Visit (HOSPITAL_COMMUNITY)
Admission: RE | Admit: 2016-09-22 | Discharge: 2016-09-22 | Disposition: A | Discharge status: Home / Self Care | Payer: Medicare Other | Source: Ambulatory Visit | Attending: Family | Admitting: Family

## 2016-09-22 ENCOUNTER — Ambulatory Visit (INDEPENDENT_AMBULATORY_CARE_PROVIDER_SITE_OTHER): Payer: Medicare Other | Admitting: Family

## 2016-09-22 ENCOUNTER — Encounter: Payer: Self-pay | Admitting: Family

## 2016-09-22 VITALS — BP 142/71 | HR 74 | Temp 98.7°F | Resp 18 | Ht 69.0 in | Wt 200.0 lb

## 2016-09-22 DIAGNOSIS — Z9889 Other specified postprocedural states: Secondary | ICD-10-CM

## 2016-09-22 DIAGNOSIS — Z959 Presence of cardiac and vascular implant and graft, unspecified: Secondary | ICD-10-CM | POA: Diagnosis not present

## 2016-09-22 DIAGNOSIS — I6523 Occlusion and stenosis of bilateral carotid arteries: Secondary | ICD-10-CM | POA: Diagnosis not present

## 2016-09-22 DIAGNOSIS — F17200 Nicotine dependence, unspecified, uncomplicated: Secondary | ICD-10-CM

## 2016-09-22 LAB — VAS US CAROTID
LCCADDIAS: -12 cm/s
LCCADSYS: -68 cm/s
LCCAPDIAS: 23 cm/s
LEFT ECA DIAS: -11 cm/s
LEFT VERTEBRAL DIAS: -30 cm/s
Left CCA prox sys: 93 cm/s
RCCAPSYS: 85 cm/s
RIGHT CCA MID DIAS: 10 cm/s
RIGHT ECA DIAS: -2 cm/s
RIGHT VERTEBRAL DIAS: 12 cm/s
Right CCA prox dias: 15 cm/s
Right cca dist sys: -57 cm/s

## 2016-09-22 NOTE — Progress Notes (Signed)
Chief Complaint: Follow up Extracranial Carotid Artery Stenosis   History of Present Illness  Alec Harris is a 71 y.o. male patient of Dr. Trula Slade who is s/p left carotid stenting with distal embolic protection on [id number].  He has previously undergone a right carotid endarterectomy in 04/08/2012. He returns today for follow up.  The patient denies any history of TIA or stroke symptoms, specifically the patient denies a history of amaurosis fugax or monocular blindness, unilateralfacial drooping, hemiplegia, or receptive or expressive aphasia.   Dr. Gwenlyn Found has been following him for his PAD, pt seems to have some claudication in his left calf, denies non healing wounds.   He played golf for 40 years, quit due to left shoulder feeling too tight.  He is looking for another way to exercise.  He states that his blood pressure increases in a medical office.   The patient denies New Medical or Surgical History.  Pt Diabetic: No Pt smoker: smoker (1/2 ppd, started at age 88 yrs)  Pt meds include: Statin : Yes ASA: Yes Other anticoagulants/antiplatelets: Plavix advised to be stopped by Dr. [name] Blew (per pt), Pletal, prescribed by Dr. Gwenlyn Found    Past Medical History:  Diagnosis Date  . Carotid artery disease ([place])    By doppler  . Claudication ([place]) 02/06/2012   ABI of 0.5 on both legs ,Rgt ext. iliac70 to 99%,rgt SFA occlude,lft ext.iliac common fem 70 to 99% ,Lft SFA occluded,mono flow popliteal  . Hyperlipemia   . PAD (peripheral artery disease) ([place]) 01/2012   severe diffuse his iliacs,SFA, carotids  . Prostate cancer ([place])   . Rectal bleeding 07/24/11   Diverticular Bleeding  . Tobacco abuse     Social History Social History  Substance Use Topics  . Smoking status: Light Tobacco Smoker    Years: 50.00    Types: Cigarettes  . Smokeless tobacco: Never Used     Comment: pt states that he is going to quit completely; 1/2 pack per day  . Alcohol use 3.0 oz/week     3 Cans of beer, 2 Shots of liquor per week    Family History Family History  Problem Relation Age of Onset  . Hypertension Mother   . Heart attack Mother   . Heart disease Mother     CHF/  After age 59  . Heart attack Father   . Heart disease Father     Irregular Heart beat, Heart Disease before age 72  . Cancer Brother     throat    Surgical History Past Surgical History:  Procedure Laterality Date  . CAROTID ANGIOGRAM  03/02/2012   bilateral carotid artery stenosis,95% right and 80% left with a type 2 arch  . CAROTID ANGIOGRAM N/A 03/02/2012   Procedure: CAROTID ANGIOGRAM;  Surgeon: Lorretta Harp, MD;  Location: [facility] CATH LAB;  Service: Cardiovascular;  Laterality: N/A;  . CAROTID ENDARTERECTOMY Right 04-08-12   CEA  . CAROTID STENT INSERTION Left 07/05/2013   Procedure: CAROTID STENT INSERTION;  Surgeon: Serafina Mitchell, MD;  Location: [facility] CATH LAB;  Service: Cardiovascular;  Laterality: Left;  . CERVICAL SPINE SURGERY  approx. 10 years ago   disc removed from neck  . COLONOSCOPY  07/25/2011   Procedure: COLONOSCOPY;  Surgeon: Beryle Beams;  Location: [facility] ENDOSCOPY;  Service: Endoscopy;  Laterality: N/A;  . DOPPLER ECHOCARDIOGRAPHY  03/24/2012   EF >70% LV normal  . ENDARTERECTOMY  04/08/2012   Procedure: ENDARTERECTOMY CAROTID;  Surgeon: Serafina Mitchell, MD;  Location: [facility] OR;  Service: Vascular;  Laterality: Right;  Right carotid endarterectomy with vascuguard 1cm x 6cm bovine patch angioplasty.   [name] KNEE CARTILAGE SURGERY     surgery for a torn ligament  . Lexiscan Myoview  01/14/2012   small fixed basal to mid inferior bowel attenuation artifact. no reversible ischemia  . LOWER EXTREMITY ANGIOGRAM N/A 03/02/2012   Procedure: LOWER EXTREMITY ANGIOGRAM;  Surgeon: Lorretta Harp, MD;  Location: [facility] CATH LAB;  Service: Cardiovascular;  Laterality: N/A;  . PENILE PROSTHESIS IMPLANT    . PROSTATE SURGERY  2003   for  prostate cancer  . PV angiogram  03/02/2012   total SFAs  bilaterally with 3- vessel runoff below the knee, high-grade calcified iliac diseaese    No Known Allergies  Current Outpatient Prescriptions  Medication Sig Dispense Refill  . abiraterone Acetate (ZYTIGA) 250 MG tablet Take 4 tablets (1,000 mg total) by mouth daily. Take on an empty stomach 1 hour before or 2 hours after a meal 120 tablet 2  . amLODipine (NORVASC) 5 MG tablet Take 1 tablet (5 mg total) by mouth daily. 90 tablet 3  . aspirin EC 81 MG tablet Take 81 mg by mouth daily.    . Calcium Carb-Cholecalciferol (CALCIUM 500 +D) 500-400 MG-UNIT TABS Take 1 tablet by mouth 2 (two) times daily.    . cilostazol (PLETAL) 100 MG tablet Take 1 tablet (100 mg total) by mouth 2 (two) times daily. 60 tablet 11  . rosuvastatin (CRESTOR) 10 MG tablet TAKE 1 TABLET (10 MG TOTAL) BY MOUTH DAILY. 30 tablet 6  . HYDROcodone-acetaminophen (NORCO) 10-325 MG tablet Take 1-2 tablets by mouth every 6 (six) hours as needed. (Patient not taking: Reported on 09/22/2016) 30 tablet 0   No current facility-administered medications for this visit.     Review of Systems : See HPI for pertinent positives and negatives.  Physical Examination  Vitals:   09/22/16 1504 09/22/16 1506 09/22/16 1507  BP: (!) 162/79 (!) 142/79 (!) 142/71  Pulse: 76 74 74  Resp: 18    Temp: 98.7 F (37.1 C)    SpO2: 99%    Weight: 200 lb (90.7 kg)    Height: 5\' 9"  (1.753 m)     Body mass index is 29.53 kg/m.  General: WDWN obese male in NAD GAIT: normal Eyes: PERRLA Pulmonary: Respirations are non-labored, CTAB with decreased air movement in all fields, no rales,  rhonchi, or wheezing.  Cardiac: regular rhythm,+ murmur.  VASCULAR EXAM Carotid Bruits Right Left   Transmitted cardiac murmur Transmitted cardiac murmur    Aorta is palpable. Radial pulses are 2+ palpable and equal.       LE Pulses Right Left   FEMORAL not palpable  not palpable    POPLITEAL not palpable  not palpable   POSTERIOR TIBIAL not palpable  not palpable    DORSALIS PEDIS  ANTERIOR TIBIAL not palpable  not palpable     Gastrointestinal: soft, nontender, BS WNL, no r/g,no palpable  masses.  Musculoskeletal: No muscle atrophy/wasting. M/S 5/5 throughout, Extremities without ischemic changes.  Neurologic: A&O X 3; Appropriate Affect,  Speech is normal CN 2-12 intact, Pain and light touch intact in extremities, Motor exam as listed above      Assessment: Nicolus Metcalfe is a 71 y.o. male who is s/p right carotid endarterectomy on 04/08/2012 and left internal carotid artery stent placementon 07/05/2013. He has no hx of stroke or TIA.  Unfortunately he continues to smoke but indicates  he would like to quit, see Plan.  Dr. Gwenlyn Found has been following pt for his PAD, pt seems to have some claudication in his left calf, denies non healing wounds.    DATA Today's carotid duplex suggests widely patent right CEA site with no evidence of restenosis or hyperplasia,  and left internal carotid artery stent with no restenosis.  Bilateral vertebral artery flow is antegrade.  Bilateral subclavian artery waveforms are normal.  No significant change compared to the last exam on 09-17-2015.   Plan:  The patient was counseled re smoking cessation and given several free resources re smoking cessation.   Follow-up in 1 year with Carotid Duplex scan.    I discussed in depth with the patient the nature of atherosclerosis, and emphasized the importance of maximal medical management including strict control of blood pressure, blood glucose, and lipid levels, obtaining regular exercise, and cessation of smoking.  The patient is aware that without maximal medical management the underlying atherosclerotic disease process will progress, limiting the benefit of  any interventions. The patient was given information about stroke prevention and what symptoms should prompt the patient to seek immediate medical care. Thank you for allowing [place] to participate in this patient's care.  Clemon Chambers, RN, MSN, FNP-C [facility] Office: [facility] Physician: Trula Slade  09/22/16 3:15 PM

## 2016-09-22 NOTE — Patient Instructions (Signed)
Stroke Prevention Some medical conditions and behaviors are associated with an increased chance of having a stroke. You may prevent a stroke by making healthy choices and managing medical conditions. How can I reduce my risk of having a stroke?  Stay physically active. Get at least 30 minutes of activity on most or all days.  Do not smoke. It may also be helpful to avoid exposure to secondhand smoke.  Limit alcohol use. Moderate alcohol use is considered to be:  No more than 2 drinks per day for men.  No more than 1 drink per day for nonpregnant women.  Eat healthy foods. This involves:  Eating 5 or more servings of fruits and vegetables a day.  Making dietary changes that address high blood pressure (hypertension), high cholesterol, diabetes, or obesity.  Manage your cholesterol levels.  Making food choices that are high in fiber and low in saturated fat, trans fat, and cholesterol may control cholesterol levels.  Take any prescribed medicines to control cholesterol as directed by your health care provider.  Manage your diabetes.  Controlling your carbohydrate and sugar intake is recommended to manage diabetes.  Take any prescribed medicines to control diabetes as directed by your health care provider.  Control your hypertension.  Making food choices that are low in salt (sodium), saturated fat, trans fat, and cholesterol is recommended to manage hypertension.  Ask your health care provider if you need treatment to lower your blood pressure. Take any prescribed medicines to control hypertension as directed by your health care provider.  If you are 18-39 years of age, have your blood pressure checked every 3-5 years. If you are 40 years of age or older, have your blood pressure checked every year.  Maintain a healthy weight.  Reducing calorie intake and making food choices that are low in sodium, saturated fat, trans fat, and cholesterol are recommended to manage  weight.  Stop drug abuse.  Avoid taking birth control pills.  Talk to your health care provider about the risks of taking birth control pills if you are over 35 years old, smoke, get migraines, or have ever had a blood clot.  Get evaluated for sleep disorders (sleep apnea).  Talk to your health care provider about getting a sleep evaluation if you snore a lot or have excessive sleepiness.  Take medicines only as directed by your health care provider.  For some people, aspirin or blood thinners (anticoagulants) are helpful in reducing the risk of forming abnormal blood clots that can lead to stroke. If you have the irregular heart rhythm of atrial fibrillation, you should be on a blood thinner unless there is a good reason you cannot take them.  Understand all your medicine instructions.  Make sure that other conditions (such as anemia or atherosclerosis) are addressed. Get help right away if:  You have sudden weakness or numbness of the face, arm, or leg, especially on one side of the body.  Your face or eyelid droops to one side.  You have sudden confusion.  You have trouble speaking (aphasia) or understanding.  You have sudden trouble seeing in one or both eyes.  You have sudden trouble walking.  You have dizziness.  You have a loss of balance or coordination.  You have a sudden, severe headache with no known cause.  You have new chest pain or an irregular heartbeat. Any of these symptoms may represent a serious problem that is an emergency. Do not wait to see if the symptoms will go away.   Get medical help at once. Call your local emergency services (911 in [place] Do not drive yourself to the hospital.  This information is not intended to replace advice given to you by your health care provider. Make sure you discuss any questions you have with your health care provider. Document Released: 09/11/2004 Document Revised: 01/10/2016 Document Reviewed: 02/04/2013 Elsevier  Interactive Patient Education  2017 Elsevier Inc.     Steps to Quit Smoking Smoking tobacco can be bad for your health. It can also affect almost every organ in your body. Smoking puts you and people around you at risk for many serious long-lasting (chronic) diseases. Quitting smoking is hard, but it is one of the best things that you can do for your health. It is never too late to quit. What are the benefits of quitting smoking? When you quit smoking, you lower your risk for getting serious diseases and conditions. They can include:  Lung cancer or lung disease.  Heart disease.  Stroke.  Heart attack.  Not being able to have children (infertility).  Weak bones (osteoporosis) and broken bones (fractures). If you have coughing, wheezing, and shortness of breath, those symptoms may get better when you quit. You may also get sick less often. If you are pregnant, quitting smoking can help to lower your chances of having a baby of low birth weight. What can I do to help me quit smoking? Talk with your doctor about what can help you quit smoking. Some things you can do (strategies) include:  Quitting smoking totally, instead of slowly cutting back how much you smoke over a period of time.  Going to in-person counseling. You are more likely to quit if you go to many counseling sessions.  Using resources and support systems, such as:  Online chats with a counselor.  Phone quitlines.  Printed self-help materials.  Support groups or group counseling.  Text messaging programs.  Mobile phone apps or applications.  Taking medicines. Some of these medicines may have nicotine in them. If you are pregnant or breastfeeding, do not take any medicines to quit smoking unless your doctor says it is okay. Talk with your doctor about counseling or other things that can help you. Talk with your doctor about using more than one strategy at the same time, such as taking medicines while you are  also going to in-person counseling. This can help make quitting easier. What things can I do to make it easier to quit? Quitting smoking might feel very hard at first, but there is a lot that you can do to make it easier. Take these steps:  Talk to your family and friends. Ask them to support and encourage you.  Call phone quitlines, reach out to support groups, or work with a counselor.  Ask people who smoke to not smoke around you.  Avoid places that make you want (trigger) to smoke, such as:  Bars.  Parties.  Smoke-break areas at work.  Spend time with people who do not smoke.  Lower the stress in your life. Stress can make you want to smoke. Try these things to help your stress:  Getting regular exercise.  Deep-breathing exercises.  Yoga.  Meditating.  Doing a body scan. To do this, close your eyes, focus on one area of your body at a time from head to toe, and notice which parts of your body are tense. Try to relax the muscles in those areas.  Download or buy apps on your mobile phone or tablet   that can help you stick to your quit plan. There are many free apps, such as QuitGuide from the CDC (Centers for Disease Control and Prevention). You can find more support from smokefree.gov and other websites. This information is not intended to replace advice given to you by your health care provider. Make sure you discuss any questions you have with your health care provider. Document Released: 05/31/2009 Document Revised: 04/01/2016 Document Reviewed: 12/19/2014 Elsevier Interactive Patient Education  2017 Elsevier Inc.  

## 2016-09-24 NOTE — Addendum Note (Signed)
Addended by: Lianne Cure A on: 09/24/2016 11:16 AM   Modules accepted: Orders

## 2016-10-02 ENCOUNTER — Telehealth: Payer: Self-pay

## 2016-10-02 NOTE — Telephone Encounter (Signed)
..  Oral Chemotherapy Follow-Up Form  Contacted [employer] to check on the status of Alec Harris application.  Per representative he has been approved from 09/29/16 to 09/29/17 to receive Zytiga at 0 cost thru the assistance program.  [employer] will reach out to the pharmacy to set up dispense and delivery to the patient.  Alec Harris should be notified ASAP and receive medication.   Thank you,  Henreitta Leber, PharmD Oral Chemotherapy Clinic

## 2016-10-06 ENCOUNTER — Telehealth: Payer: Self-pay | Admitting: *Deleted

## 2016-10-06 DIAGNOSIS — R221 Localized swelling, mass and lump, neck: Secondary | ICD-10-CM | POA: Diagnosis not present

## 2016-10-06 DIAGNOSIS — I10 Essential (primary) hypertension: Secondary | ICD-10-CM | POA: Diagnosis not present

## 2016-10-06 DIAGNOSIS — K1120 Sialoadenitis, unspecified: Secondary | ICD-10-CM | POA: Diagnosis not present

## 2016-10-06 NOTE — Telephone Encounter (Signed)
Patient called and stated,"the right side of my neck is swollen. I noticed it yesterday. Could this be coming from the [facility]?" Per Dr. [name] Blew, this is not coming from the [facility]. Patient verbalized understanding. I told patient,"you need to go see your PCP." Patient stated,"I don't have a PCP. I will go to an Urgent Care."

## 2016-10-09 ENCOUNTER — Telehealth: Payer: Self-pay | Admitting: *Deleted

## 2016-10-09 NOTE — Telephone Encounter (Signed)
Patient notified

## 2016-10-09 NOTE — Telephone Encounter (Signed)
No antibiotics needed.

## 2016-10-09 NOTE — Telephone Encounter (Signed)
"  I was instructed to call Dr. Hazeline Junker nurse.  I did go to the Urgent Care.  CT was done at [facility] in [place] and resulted to th [facility] at [facility].  No cancer was found.  I was not given any antibiotics and I don't know why.  I can be reached at home.  [phone]."  Denies fever.  No redness, warmth, abrasion/cut/laceration with drainage to this swollen are.  Doesn't sound like infection to justify antibiotic use.

## 2016-10-15 ENCOUNTER — Other Ambulatory Visit: Payer: Self-pay | Admitting: *Deleted

## 2016-10-15 MED ORDER — ABIRATERONE ACETATE 250 MG PO TABS: 1000.0000 mg | ORAL_TABLET | Freq: Every day | ORAL | 2 refills | Status: DC

## 2016-11-26 ENCOUNTER — Other Ambulatory Visit (HOSPITAL_BASED_OUTPATIENT_CLINIC_OR_DEPARTMENT_OTHER): Payer: Medicare Other

## 2016-11-26 ENCOUNTER — Ambulatory Visit (HOSPITAL_BASED_OUTPATIENT_CLINIC_OR_DEPARTMENT_OTHER): Payer: Medicare Other | Admitting: Oncology

## 2016-11-26 ENCOUNTER — Telehealth: Payer: Self-pay | Admitting: Oncology

## 2016-11-26 VITALS — BP 176/58 | HR 68 | Temp 98.0°F | Resp 18 | Ht 69.0 in | Wt 200.6 lb

## 2016-11-26 DIAGNOSIS — C7951 Secondary malignant neoplasm of bone: Secondary | ICD-10-CM

## 2016-11-26 DIAGNOSIS — D649 Anemia, unspecified: Secondary | ICD-10-CM

## 2016-11-26 DIAGNOSIS — C61 Malignant neoplasm of prostate: Secondary | ICD-10-CM

## 2016-11-26 LAB — CBC WITH DIFFERENTIAL/PLATELET
BASO%: 0.7 % (ref 0.0–2.0)
BASOS ABS: 0 10*3/uL (ref 0.0–0.1)
EOS ABS: 0.2 10*3/uL (ref 0.0–0.5)
EOS%: 2.3 % (ref 0.0–7.0)
HEMATOCRIT: 38.1 % — AB (ref 38.4–49.9)
HGB: 12.8 g/dL — ABNORMAL LOW (ref 13.0–17.1)
LYMPH#: 3 10*3/uL (ref 0.9–3.3)
LYMPH%: 43 % (ref 14.0–49.0)
MCH: 30.6 pg (ref 27.2–33.4)
MCHC: 33.6 g/dL (ref 32.0–36.0)
MCV: 91.1 fL (ref 79.3–98.0)
MONO#: 0.5 10*3/uL (ref 0.1–0.9)
MONO%: 7.6 % (ref 0.0–14.0)
NEUT#: 3.2 10*3/uL (ref 1.5–6.5)
NEUT%: 46.4 % (ref 39.0–75.0)
PLATELETS: 204 10*3/uL (ref 140–400)
RBC: 4.18 10*6/uL — AB (ref 4.20–5.82)
RDW: 15.6 % — ABNORMAL HIGH (ref 11.0–14.6)
WBC: 6.9 10*3/uL (ref 4.0–10.3)

## 2016-11-26 LAB — COMPREHENSIVE METABOLIC PANEL
ALT: 7 U/L (ref 0–55)
ANION GAP: 10 meq/L (ref 3–11)
AST: 9 U/L (ref 5–34)
Albumin: 3.2 g/dL — ABNORMAL LOW (ref 3.5–5.0)
Alkaline Phosphatase: 127 U/L (ref 40–150)
BUN: 12.2 mg/dL (ref 7.0–26.0)
CALCIUM: 8.9 mg/dL (ref 8.4–10.4)
CHLORIDE: 108 meq/L (ref 98–109)
CO2: 27 meq/L (ref 22–29)
CREATININE: 0.9 mg/dL (ref 0.7–1.3)
EGFR: >90 mL/min/{1.73_m2} (ref >90)
Glucose: 146 mg/dl — ABNORMAL HIGH (ref 70–140)
POTASSIUM: 3.5 meq/L (ref 3.5–5.1)
Sodium: 144 mEq/L (ref 136–145)
Total Bilirubin: 0.79 mg/dL (ref 0.20–1.20)
Total Protein: 6.7 g/dL (ref 6.4–8.3)

## 2016-11-26 NOTE — Progress Notes (Signed)
Hematology and Oncology Follow Up Visit  Alec Harris [id number] 1946/04/19 71 y.o. 11/26/2016 1:32 PM No PCP Per PatientNo ref. provider found   Principle Diagnosis: 71 year old with Castration resistant prostate cancer. He was diagnosed in 2003 with Gleason score 4 + 4 = 8. Now he has metastatic disease with involvement of lymph nodes and bone.  Prior Therapy:   He underwent a prostatectomy with a pathology that showed prostatic adenocarcinoma. Gleason score 4+4 equals 8, with the tumor involving the margins and the pathologic staging was pT4 N1 with 1 of 2 left pelvic lymph nodes involved.  He was started on adjuvant hormone therapy with Lupron. He developed a rising PSA and bone metastasis.  He was then treated with ketoconazole and prednisone. He developed a rising PSA with a doubling time of just over 6 months. Bone scan in September 2013 showed increased uptake in the right lower lumbar spine at L4 and L5 and abnormal uptake in the mid sternum which had progressed since the prior bone scan.  Current therapy:  Zytiga 1000 mg daily started in October 2013.  Lupron given at at [facility] urology.  Interim History: Alec Harris presents today for a follow-up visit. Since his last visit, he developed a neck mass in the last month or so that have subsequently subsided. He did have a CT scan of the neck ordered by his primary care provider which did not show any evidence of lymphadenopathy. There is enlargement of the right parotid gland was noted. The right submandibular gland was also noted associated with inflammatory changes. He reports these symptoms have subsided and reports no recent complaints.  He continues to take Zytiga and have not reported any new side effects. He does not report any lower extremity edema or GI complications. He does not report any thrombosis of bleeding episodes. He has been eating well and his performance status remains within normal range. He denied any recent  hospitalization or illnesses.  He denies any headaches blurred vision or double vision. He denied any chest pain, palpitation or leg edema. He reports no nausea or vomiting. Denies hematuria or other evidence of bleeding.  Has not reported any cough or hemoptysis or hematemesis. Does not report any hematochezia or melena. Does not report any frequency urgency or hesitancy. Does not report any rash or lesions. Does not report any petechiae. Remainder of his review of systems was unremarkable.  Medications: I have reviewed the patient's current medications. No changes on my review today. Current Outpatient Prescriptions  Medication Sig Dispense Refill  . abiraterone Acetate (ZYTIGA) 250 MG tablet Take 4 tablets (1,000 mg total) by mouth daily. Take on an empty stomach 1 hour before or 2 hours after a meal 120 tablet 2  . amLODipine (NORVASC) 5 MG tablet Take 1 tablet (5 mg total) by mouth daily. 90 tablet 3  . aspirin EC 81 MG tablet Take 81 mg by mouth daily.    . Calcium Carb-Cholecalciferol (CALCIUM 500 +D) 500-400 MG-UNIT TABS Take 1 tablet by mouth 2 (two) times daily.    . cilostazol (PLETAL) 100 MG tablet Take 1 tablet (100 mg total) by mouth 2 (two) times daily. 60 tablet 11  . HYDROcodone-acetaminophen (NORCO) 10-325 MG tablet Take 1-2 tablets by mouth every 6 (six) hours as needed. 30 tablet 0  . rosuvastatin (CRESTOR) 10 MG tablet TAKE 1 TABLET (10 MG TOTAL) BY MOUTH DAILY. 30 tablet 6   No current facility-administered medications for this visit.  Allergies: No Known Allergies   Physical Exam: Blood pressure (!) 176/58, pulse 68, temperature 98 F (36.7 C), temperature source Oral, resp. rate 18, height 5\' 9"  (1.753 m), weight 200 lb 9.6 oz (91 kg), SpO2 99 %.  ECOG: 1 General appearance: Well-appearing gentleman without distress.. Head: Normocephalic, without obvious abnormality no thrush or ulcers. Neck: no adenopathy. Very small submandibular enlargement nontender to  touch. Lymph nodes: Cervical, supraclavicular, and axillary nodes normal. Heart: Regular rate and rhythm with a systolic murmur auscultated. Lung:chest clear, no wheezing, rales, normal symmetric air entry. Chest wall examination showed no abnormalities noted on inspection.  Abdomen: soft, non-tender, without masses or organomegaly no rebound or guarding. WKM:QKMMN edema bilateral lower extremities Neurological examination: No deficits noted.  Lab Results: Lab Results  Component Value Date   WBC 6.9 11/26/2016   HGB 12.8 (L) 11/26/2016   HCT 38.1 (L) 11/26/2016   MCV 91.1 11/26/2016   PLT 204 11/26/2016     Chemistry      Component Value Date/Time   NA 143 08/21/2016 0935   K 3.1 (L) 08/21/2016 0935   CL 107 05/28/2016 1355   CL 106 02/01/2013 1120   CO2 24 08/21/2016 0935   BUN 9.9 08/21/2016 0935   CREATININE 0.8 08/21/2016 0935      Component Value Date/Time   CALCIUM 8.8 08/21/2016 0935   ALKPHOS 110 08/21/2016 0935   AST 8 08/21/2016 0935   ALT <6 08/21/2016 0935   BILITOT 0.85 08/21/2016 0935       Results for Alec Harris (MRN [id number]) as of 11/26/2016 13:15  Ref. Range 04/02/2016 09:58 05/28/2016 12:59 08/21/2016 09:35  PSA Latest Ref Range: 0.0 - 4.0 ng/mL 3.8 4.8 (H) 4.4 (H)    Impression and Plan:   71 year old gentleman with the following issues:  1. Castration resistant prostate cancer with metastatic disease to the bone. He is status post multiple therapies in the past and most recently have been on [place] Since October 2013.  His PSA on 08/21/2016 was 4.4 which is stable compared to his previous PSAs. His bone scan was also reviewed and not dramatically different.  The plan is to continue with Zytiga at this time and uses different salvage therapy upon symptomatic progression. These options would include systemic chemotherapy at that time.  2. Androgen deprivation. He remains on Lupron given at [facility] urology every 4 months. I  recommend  continuing this indefinitely.  3. Bone directed therapy. Continue calcium and vitamin D supplements. Bone directed therapy will be considered in the future. He has declined that in the past.  4. Anemia: Hemoglobin is close to normal range.  5. Sternal pain: His continue be episodic and response to anti-inflammatories. No major changes in the quality of this pain at this time.  6. Neck mass: It appears to be salivary gland related without any evidence of malignancy. Appears to have resolved at this time.  6. Followup. In 3 months.    Zola Button, MD 4/11/20181:32 PM

## 2016-11-26 NOTE — Telephone Encounter (Signed)
Gave patient AVS and calender per 4/11 los. Patient prefers to come in on Wednesday - scheduled appt for 4/18 instead of 4/19

## 2016-11-27 LAB — PSA: Prostate Specific Ag, Serum: 6.4 ng/mL — ABNORMAL HIGH (ref 0.0–4.0)

## 2016-12-22 DIAGNOSIS — C7951 Secondary malignant neoplasm of bone: Secondary | ICD-10-CM | POA: Diagnosis not present

## 2016-12-22 DIAGNOSIS — C61 Malignant neoplasm of prostate: Secondary | ICD-10-CM | POA: Diagnosis not present

## 2016-12-26 ENCOUNTER — Encounter: Payer: Self-pay | Admitting: *Deleted

## 2016-12-30 ENCOUNTER — Other Ambulatory Visit: Payer: Self-pay | Admitting: Cardiovascular Disease

## 2016-12-31 ENCOUNTER — Ambulatory Visit (INDEPENDENT_AMBULATORY_CARE_PROVIDER_SITE_OTHER): Payer: Medicare Other | Admitting: Cardiovascular Disease

## 2016-12-31 ENCOUNTER — Encounter: Payer: Self-pay | Admitting: Cardiovascular Disease

## 2016-12-31 VITALS — BP 136/54 | HR 67 | Ht 69.0 in | Wt 193.0 lb

## 2016-12-31 DIAGNOSIS — E785 Hyperlipidemia, unspecified: Secondary | ICD-10-CM

## 2016-12-31 DIAGNOSIS — I6523 Occlusion and stenosis of bilateral carotid arteries: Secondary | ICD-10-CM | POA: Diagnosis not present

## 2016-12-31 DIAGNOSIS — E7800 Pure hypercholesterolemia, unspecified: Secondary | ICD-10-CM | POA: Diagnosis not present

## 2016-12-31 DIAGNOSIS — I739 Peripheral vascular disease, unspecified: Secondary | ICD-10-CM

## 2016-12-31 NOTE — Assessment & Plan Note (Signed)
History of hyperlipidemia on statin therapy. We will recheck a lipid and liver profile 

## 2016-12-31 NOTE — Progress Notes (Signed)
12/31/2016 Alec Harris   22-Jul-1946  [id number]  Primary Physician Patient, No Pcp Per Primary Cardiologist: Lorretta Harp MD Renae Gloss  HPI:  The patient is a 71 year old mildly overweight married African American male father of 2, grandfather to 2 grandchildren who I last saw in the office 11/06/15.[name] He was referred to me by Dr. Debara Pickett for PV evaluation. He has severe diffuse vascular disease involving his iliacs, SFAs and carotids. His claudication improved with Pletal. His other problems include hyperlipidemia. He had a negative Myoview Jan 14, 2012. He does continue to smoke. He stopped his Crestor on his own and did have an elevated lipid profile in the past with a total cholesterol of 255, LDL of 169 and HDL of 56. I angiogrammed him on March 02, 2012 revealing total SFAs bilaterally with 3-vessel runoff below the knee, high-grade calcified iliac disease, and bilateral carotid artery stenosis, 95% right and 80% left with a type 2 arch. He underwent elective right carotid endarterectomy by Dr. Annamarie Major on April 08, 2012.over the last 6 months he denies chest pain shortness of breath or claudication. The Pletal has been beneficial. Unfortunately he did not obtain his carotid artery Doppler studies as originally intended. He had followup carotid Dopplers performed in our office on 01/18/13 which revealed a widely patent right carotid endarterectomy site, and progression of disease on the left which had already been documented angiographically to be in the 80% range. He was neurologically asymptomatic. He ultimately underwent elective left internal carotid artery stenting by myself and Dr. Trula Slade 07/05/13. He denies chest pain or shortness of breath but does complain of lifestyle limiting claudication. Lower cineangiography performed 03/02/12 by myself revealed occluded SFAs bilaterally with high-grade calcified right external and left common iliac artery stenosis. His last  Dopplers performed 09/28/14 revealed ABIs in the midpoint 6 range bilaterally with occluded SFAs and high-grade iliacs bilaterally. The patient really denies lifestyle limiting claudication, chest pain or shortness of breath since I saw him back a year ago. He does continue to smoke a half a pack of cigarettes a day however.   Current Outpatient Prescriptions  Medication Sig Dispense Refill  . abiraterone Acetate (ZYTIGA) 250 MG tablet Take 4 tablets (1,000 mg total) by mouth daily. Take on an empty stomach 1 hour before or 2 hours after a meal 120 tablet 2  . amLODipine (NORVASC) 5 MG tablet TAKE 1 TABLET (5 MG TOTAL) BY MOUTH DAILY. 90 tablet 3  . aspirin EC 81 MG tablet Take 81 mg by mouth daily.    . Calcium Carb-Cholecalciferol (CALCIUM 500 +D) 500-400 MG-UNIT TABS Take 1 tablet by mouth 2 (two) times daily.    . cilostazol (PLETAL) 100 MG tablet Take 1 tablet (100 mg total) by mouth 2 (two) times daily. 60 tablet 11  . rosuvastatin (CRESTOR) 10 MG tablet TAKE 1 TABLET (10 MG TOTAL) BY MOUTH DAILY. 30 tablet 6   No current facility-administered medications for this visit.     No Known Allergies  Social History   Social History  . Marital status: Married    Spouse name: N/A  . Number of children: N/A  . Years of education: N/A   Occupational History  . Not on file.   Social History Main Topics  . Smoking status: Light Tobacco Smoker    Years: 50.00    Types: Cigarettes  . Smokeless tobacco: Never Used     Comment: pt states that he is going to  quit completely; 1/2 pack per day  . Alcohol use 3.0 oz/week    3 Cans of beer, 2 Shots of liquor per week  . Drug use: No  . Sexual activity: No     Comment: smokes 5 cigarettes per day....   Other Topics Concern  . Not on file   Social History Narrative  . No narrative on file     Review of Systems: General: negative for chills, fever, night sweats or weight changes.  Cardiovascular: negative for chest pain, dyspnea on  exertion, edema, orthopnea, palpitations, paroxysmal nocturnal dyspnea or shortness of breath Dermatological: negative for rash Respiratory: negative for cough or wheezing Urologic: negative for hematuria Abdominal: negative for nausea, vomiting, diarrhea, bright red blood per rectum, melena, or hematemesis Neurologic: negative for visual changes, syncope, or dizziness All other systems reviewed and are otherwise negative except as noted above.    Blood pressure (!) 136/54, pulse 67, height 5\' 9"  (1.753 m), weight 193 lb (87.5 kg).  General appearance: alert and no distress Neck: no adenopathy, no JVD, supple, symmetrical, trachea midline, thyroid not enlarged, symmetric, no tenderness/mass/nodules and Soft right carotid bruit Lungs: clear to auscultation bilaterally Heart: regular rate and rhythm, S1, S2 normal, no murmur, click, rub or gallop Extremities: extremities normal, atraumatic, no cyanosis or edema  EKG sinus rhythm at 67 without ST or T-wave changes. I personally reviewed this EKG  ASSESSMENT AND PLAN:   Carotid disease, bilateral History of bilateral carotid disease status post elective right carotid endarterectomy by Dr. Trula Slade 04/08/12 and subsequent left internal carotid artery stenting by myself and Dr. Trula Slade on 07/05/13. He is followed by 2 points ultrasound in his office.  Hyperlipemia History of hyperlipidemia on statin therapy. We will recheck a lipid and liver profile  PVD (peripheral vascular disease) with claudication History of peripheral arterial disease status post angiography by myself 03/02/12 revealing high-grade calcified right external iliac stenosis with a total right SFA and left common iliac artery stenosis with total left SFA. He has three-vessel runoff bilaterally. He never had iliac intervention after that. He does complain of some claudication but this does not appear to be lifestyle limiting.      Lorretta Harp MD FACP,FACC,FAHA,  [facility] 12/31/2016 11:34 AM

## 2016-12-31 NOTE — Assessment & Plan Note (Signed)
History of bilateral carotid disease status post elective right carotid endarterectomy by Dr. Trula Slade 04/08/12 and subsequent left internal carotid artery stenting by myself and Dr. Trula Slade on 07/05/13. He is followed by 2 points ultrasound in his office.

## 2016-12-31 NOTE — Assessment & Plan Note (Signed)
History of peripheral arterial disease status post angiography by myself 03/02/12 revealing high-grade calcified right external iliac stenosis with a total right SFA and left common iliac artery stenosis with total left SFA. He has three-vessel runoff bilaterally. He never had iliac intervention after that. He does complain of some claudication but this does not appear to be lifestyle limiting.

## 2016-12-31 NOTE — Patient Instructions (Signed)

## 2017-01-05 ENCOUNTER — Telehealth: Payer: Self-pay | Admitting: *Deleted

## 2017-01-05 NOTE — Telephone Encounter (Signed)
Patient and pharmacy are requesting new rx for Atorvastatin 20mg  or Simvastatin 40mg  due to cost. These are less expensive on patient formulary and would like to switch for cost savings of appropriate.

## 2017-01-06 NOTE — Telephone Encounter (Signed)
Fine with me

## 2017-01-07 MED ORDER — ATORVASTATIN CALCIUM 20 MG PO TABS: 20.0000 mg | ORAL_TABLET | Freq: Every day | ORAL | 1 refills | Status: DC

## 2017-01-07 NOTE — Telephone Encounter (Signed)
Atorvastatin 20 mg daily. Repeat labs after 2-3 months

## 2017-01-07 NOTE — Telephone Encounter (Signed)
PT NOTIFIED Verified pharmacy-sent

## 2017-01-07 NOTE — Telephone Encounter (Signed)
Which is more appropriate for this patient? Atorvastatin or Simvastatin?  Routing to MD and PharmD

## 2017-01-13 ENCOUNTER — Ambulatory Visit (HOSPITAL_COMMUNITY)
Admission: EM | Admit: 2017-01-13 | Discharge: 2017-01-13 | Disposition: A | Discharge status: Home / Self Care | Payer: Medicare Other

## 2017-01-14 ENCOUNTER — Ambulatory Visit (HOSPITAL_COMMUNITY)
Admission: EM | Admit: 2017-01-14 | Discharge: 2017-01-14 | Disposition: A | Discharge status: Home / Self Care | Payer: Medicare Other | Attending: Family Medicine | Admitting: Family Medicine

## 2017-01-14 ENCOUNTER — Encounter (HOSPITAL_COMMUNITY): Payer: Self-pay | Admitting: Family Medicine

## 2017-01-14 DIAGNOSIS — S46311A Strain of muscle, fascia and tendon of triceps, right arm, initial encounter: Secondary | ICD-10-CM

## 2017-01-14 MED ORDER — METHOCARBAMOL 500 MG PO TABS: 500.0000 mg | ORAL_TABLET | Freq: Two times a day (BID) | ORAL | 0 refills | Status: DC

## 2017-01-14 NOTE — ED Triage Notes (Signed)
Pt here for 4 days of right arm pain that starts in triceps area and radiated down the arm. Denies overuse or injury.

## 2017-01-14 NOTE — ED Provider Notes (Signed)
CSN: [id number]     Arrival date & time 01/14/17  1003 History   None    Chief Complaint  Patient presents with  . Arm Pain   (Consider location/radiation/quality/duration/timing/severity/associated sxs/prior Treatment) Patient c/o moderate pain in his right arm and denies any specific injury.     The history is provided by the patient.  Arm Pain  This is a new problem. The problem occurs constantly. The problem has not changed since onset.Nothing aggravates the symptoms. Nothing relieves the symptoms. He has tried nothing for the symptoms.    Past Medical History:  Diagnosis Date  . Carotid artery disease ([place])    By doppler  . Claudication ([place]) 02/06/2012   ABI of 0.5 on both legs ,Rgt ext. iliac70 to 99%,rgt SFA occlude,lft ext.iliac common fem 70 to 99% ,Lft SFA occluded,mono flow popliteal  . Hyperlipemia   . PAD (peripheral artery disease) ([place]) 01/2012   severe diffuse his iliacs,SFA, carotids  . Prostate cancer ([place])   . Rectal bleeding 07/24/11   Diverticular Bleeding  . Tobacco abuse    Past Surgical History:  Procedure Laterality Date  . CAROTID ANGIOGRAM  03/02/2012   bilateral carotid artery stenosis,95% right and 80% left with a type 2 arch  . CAROTID ANGIOGRAM N/A 03/02/2012   Procedure: CAROTID ANGIOGRAM;  Surgeon: Lorretta Harp, MD;  Location: [facility] CATH LAB;  Service: Cardiovascular;  Laterality: N/A;  . CAROTID ENDARTERECTOMY Right 04-08-12   CEA  . CAROTID STENT INSERTION Left 07/05/2013   Procedure: CAROTID STENT INSERTION;  Surgeon: Serafina Mitchell, MD;  Location: [facility] CATH LAB;  Service: Cardiovascular;  Laterality: Left;  . CERVICAL SPINE SURGERY  approx. 10 years ago   disc removed from neck  . COLONOSCOPY  07/25/2011   Procedure: COLONOSCOPY;  Surgeon: Beryle Beams;  Location: [facility] ENDOSCOPY;  Service: Endoscopy;  Laterality: N/A;  . DOPPLER ECHOCARDIOGRAPHY  03/24/2012   EF >70% LV normal  . ENDARTERECTOMY  04/08/2012   Procedure: ENDARTERECTOMY  CAROTID;  Surgeon: Serafina Mitchell, MD;  Location: [place] [facility] OR;  Service: Vascular;  Laterality: Right;  Right carotid endarterectomy with vascuguard 1cm x 6cm bovine patch angioplasty.   [name] KNEE CARTILAGE SURGERY     surgery for a torn ligament  . Lexiscan Myoview  01/14/2012   small fixed basal to mid inferior bowel attenuation artifact. no reversible ischemia  . LOWER EXTREMITY ANGIOGRAM N/A 03/02/2012   Procedure: LOWER EXTREMITY ANGIOGRAM;  Surgeon: Lorretta Harp, MD;  Location: [facility] CATH LAB;  Service: Cardiovascular;  Laterality: N/A;  . PENILE PROSTHESIS IMPLANT    . PROSTATE SURGERY  2003   for  prostate cancer  . PV angiogram  03/02/2012   total SFAs bilaterally with 3- vessel runoff below the knee, high-grade calcified iliac diseaese   Family History  Problem Relation Age of Onset  . Hypertension Mother   . Heart attack Mother   . Heart disease Mother        CHF/  After age 4  . Heart attack Father   . Heart disease Father        Irregular Heart beat, Heart Disease before age 68  . Cancer Brother        throat   Social History  Substance Use Topics  . Smoking status: Light Tobacco Smoker    Years: 50.00    Types: Cigarettes  . Smokeless tobacco: Never Used     Comment: pt states that he is going to quit completely; 1/2  pack per day  . Alcohol use 3.0 oz/week    3 Cans of beer, 2 Shots of liquor per week    Review of Systems  Constitutional: Negative.   HENT: Negative.   Eyes: Negative.   Respiratory: Negative.   Cardiovascular: Negative.   Gastrointestinal: Negative.   Endocrine: Negative.   Genitourinary: Negative.   Musculoskeletal: Positive for arthralgias.  Allergic/Immunologic: Negative.   Neurological: Negative.   Hematological: Negative.   Psychiatric/Behavioral: Negative.     Allergies  Patient has no known allergies.  Home Medications   Prior to Admission medications   Medication Sig Start Date End Date Taking? Authorizing Provider  abiraterone  Acetate (ZYTIGA) 250 MG tablet Take 4 tablets (1,000 mg total) by mouth daily. Take on an empty stomach 1 hour before or 2 hours after a meal 10/15/16   [name], MD  amLODipine (NORVASC) 5 MG tablet TAKE 1 TABLET (5 MG TOTAL) BY MOUTH DAILY. 12/30/16   Lorretta Harp, MD  aspirin EC 81 MG tablet Take 81 mg by mouth daily.    [provider]  atorvastatin (LIPITOR) 20 MG tablet Take 1 tablet (20 mg total) by mouth daily. 01/07/17 04/07/17  Lorretta Harp, MD  Calcium Carb-Cholecalciferol (CALCIUM 500 +D) 500-400 MG-UNIT TABS Take 1 tablet by mouth 2 (two) times daily.    [provider]  cilostazol (PLETAL) 100 MG tablet Take 1 tablet (100 mg total) by mouth 2 (two) times daily. 11/15/15   Lorretta Harp, MD  methocarbamol (ROBAXIN) 500 MG tablet Take 1 tablet (500 mg total) by mouth 2 (two) times daily. 01/14/17   Lysbeth Penner, FNP  rosuvastatin (CRESTOR) 10 MG tablet TAKE 1 TABLET (10 MG TOTAL) BY MOUTH DAILY. 08/04/16   Lorretta Harp, MD   Meds Ordered and Administered this Visit  Medications - No data to display  BP (!) 193/71   Pulse 74   Temp 98.6 F (37 C)   Resp 18   SpO2 98%  No data found.   Physical Exam  Constitutional: He is oriented to person, place, and time. He appears well-developed and well-nourished.  HENT:  Head: Normocephalic and atraumatic.  Eyes: Conjunctivae and EOM are normal. Pupils are equal, round, and reactive to light.  Neck: Normal range of motion. Neck supple.  Cardiovascular: Normal rate, regular rhythm and normal heart sounds.   Pulmonary/Chest: Effort normal and breath sounds normal.  Musculoskeletal: He exhibits tenderness.  TTP to belly of right triceps muscle.  Neurological: He is alert and oriented to person, place, and time.  Nursing note and vitals reviewed.   Urgent Care Course     Procedures (including critical care time)  Labs Review Labs Reviewed - No data to display  Imaging Review No  results found.   Visual Acuity Review  Right Eye Distance:   Left Eye Distance:   Bilateral Distance:    Right Eye Near:   Left Eye Near:    Bilateral Near:         MDM   1. Strain of right triceps, initial encounter    Can take tylenol otc as directed Robaxin 500mg  one po bid prn #20  Follow up with Pcp for elevated bp and also for right arm pain if persists.      Lysbeth Penner, [place] 01/14/17 1028

## 2017-01-20 ENCOUNTER — Other Ambulatory Visit: Payer: Self-pay | Admitting: *Deleted

## 2017-01-20 MED ORDER — ABIRATERONE ACETATE 250 MG PO TABS: 1000.0000 mg | ORAL_TABLET | Freq: Every day | ORAL | 2 refills | Status: DC

## 2017-02-06 ENCOUNTER — Encounter: Payer: Self-pay | Admitting: *Deleted

## 2017-02-10 ENCOUNTER — Encounter: Payer: Self-pay | Admitting: *Deleted

## 2017-02-15 ENCOUNTER — Other Ambulatory Visit: Payer: Self-pay | Admitting: Cardiovascular Disease

## 2017-03-04 ENCOUNTER — Ambulatory Visit (HOSPITAL_BASED_OUTPATIENT_CLINIC_OR_DEPARTMENT_OTHER): Payer: Medicare Other | Admitting: Oncology

## 2017-03-04 ENCOUNTER — Other Ambulatory Visit (HOSPITAL_BASED_OUTPATIENT_CLINIC_OR_DEPARTMENT_OTHER): Payer: Medicare Other

## 2017-03-04 VITALS — BP 175/66 | HR 70 | Temp 98.7°F | Resp 18 | Ht 69.0 in | Wt 191.6 lb

## 2017-03-04 DIAGNOSIS — R072 Precordial pain: Secondary | ICD-10-CM

## 2017-03-04 DIAGNOSIS — C7951 Secondary malignant neoplasm of bone: Secondary | ICD-10-CM

## 2017-03-04 DIAGNOSIS — E291 Testicular hypofunction: Secondary | ICD-10-CM

## 2017-03-04 DIAGNOSIS — C61 Malignant neoplasm of prostate: Secondary | ICD-10-CM | POA: Diagnosis not present

## 2017-03-04 DIAGNOSIS — D649 Anemia, unspecified: Secondary | ICD-10-CM

## 2017-03-04 LAB — COMPREHENSIVE METABOLIC PANEL
ALT: <6 U/L (ref 0–55)
ANION GAP: 8 meq/L (ref 3–11)
AST: 10 U/L (ref 5–34)
Albumin: 3.2 g/dL — ABNORMAL LOW (ref 3.5–5.0)
Alkaline Phosphatase: 203 U/L — ABNORMAL HIGH (ref 40–150)
BUN: 10.5 mg/dL (ref 7.0–26.0)
CHLORIDE: 109 meq/L (ref 98–109)
CO2: 26 meq/L (ref 22–29)
Calcium: 8.9 mg/dL (ref 8.4–10.4)
Creatinine: 0.9 mg/dL (ref 0.7–1.3)
Glucose: 106 mg/dl (ref 70–140)
Potassium: 3.8 mEq/L (ref 3.5–5.1)
SODIUM: 143 meq/L (ref 136–145)
Total Bilirubin: 0.75 mg/dL (ref 0.20–1.20)
Total Protein: 6.7 g/dL (ref 6.4–8.3)

## 2017-03-04 LAB — CBC WITH DIFFERENTIAL/PLATELET
BASO%: 0.8 % (ref 0.0–2.0)
Basophils Absolute: 0.1 10*3/uL (ref 0.0–0.1)
EOS%: 2 % (ref 0.0–7.0)
Eosinophils Absolute: 0.1 10*3/uL (ref 0.0–0.5)
HCT: 37.5 % — ABNORMAL LOW (ref 38.4–49.9)
HGB: 12.6 g/dL — ABNORMAL LOW (ref 13.0–17.1)
LYMPH%: 45.4 % (ref 14.0–49.0)
MCH: 30.6 pg (ref 27.2–33.4)
MCHC: 33.5 g/dL (ref 32.0–36.0)
MCV: 91.4 fL (ref 79.3–98.0)
MONO#: 0.6 10*3/uL (ref 0.1–0.9)
MONO%: 8.1 % (ref 0.0–14.0)
NEUT#: 3.2 10*3/uL (ref 1.5–6.5)
NEUT%: 43.7 % (ref 39.0–75.0)
PLATELETS: 199 10*3/uL (ref 140–400)
RBC: 4.1 10*6/uL — AB (ref 4.20–5.82)
RDW: 16.6 % — ABNORMAL HIGH (ref 11.0–14.6)
WBC: 7.3 10*3/uL (ref 4.0–10.3)
lymph#: 3.3 10*3/uL (ref 0.9–3.3)

## 2017-03-04 MED ORDER — TRAMADOL HCL 50 MG PO TABS: 50.0000 mg | ORAL_TABLET | Freq: Four times a day (QID) | ORAL | 1 refills | Status: DC | PRN

## 2017-03-04 NOTE — Progress Notes (Signed)
Hematology and Oncology Follow Up Visit  Alec Harris [id number] February 13, 1946 71 y.o. 03/04/2017 1:26 PM Patient, No Pcp PerNo ref. provider found   Principle Diagnosis: 71 year old with Castration resistant prostate cancer. He was diagnosed in 2003 with Gleason score 4 + 4 = 8. Now he has metastatic disease with involvement of lymph nodes and bone.  Prior Therapy:   He underwent a prostatectomy with a pathology that showed prostatic adenocarcinoma. Gleason score 4+4 equals 8, with the tumor involving the margins and the pathologic staging was pT4 N1 with 1 of 2 left pelvic lymph nodes involved.  He was started on adjuvant hormone therapy with Lupron. He developed a rising PSA and bone metastasis.  He was then treated with ketoconazole and prednisone. He developed a rising PSA with a doubling time of just over 6 months. Bone scan in September 2013 showed increased uptake in the right lower lumbar spine at L4 and L5 and abnormal uptake in the mid sternum which had progressed since the prior bone scan.  Current therapy:  Zytiga 1000 mg daily started in October 2013.  Lupron given at at [facility] urology.  Interim History: Alec Harris presents today for a follow-up visit. Since his last visit, he reports feeling reasonably well without any major changes. He does report periodic chest wall discomfort that Tylenol and antral is not helping. He denied any other back pain or bone pain. He continues to ambulate without any difficulties. He continues to take Zytiga and have not reported any new side effects. He does not report any lower extremity edema or GI complications. He does not report any thrombosis of bleeding episodes. He has been eating well and his performance status remains within normal range. He denied any recent hospitalization or illnesses.  He denies any headaches blurred vision or double vision. He denied any chest pain, palpitation or leg edema. He reports no nausea or vomiting. Denies  hematuria or other evidence of bleeding.  Has not reported any cough or hemoptysis or hematemesis. Does not report any hematochezia or melena. Does not report any frequency urgency or hesitancy. Does not report any rash or lesions. Does not report any petechiae. Remainder of his review of systems was unremarkable.  Medications: I have reviewed the patient's current medications. No changes on my review today. Current Outpatient Prescriptions  Medication Sig Dispense Refill  . abiraterone Acetate (ZYTIGA) 250 MG tablet Take 4 tablets (1,000 mg total) by mouth daily. Take on an empty stomach 1 hour before or 2 hours after a meal 120 tablet 2  . amLODipine (NORVASC) 5 MG tablet TAKE 1 TABLET (5 MG TOTAL) BY MOUTH DAILY. 90 tablet 3  . aspirin EC 81 MG tablet Take 81 mg by mouth daily.    [name] atorvastatin (LIPITOR) 20 MG tablet Take 1 tablet (20 mg total) by mouth daily. 90 tablet 1  . Calcium Carb-Cholecalciferol (CALCIUM 500 +D) 500-400 MG-UNIT TABS Take 1 tablet by mouth 2 (two) times daily.    . cilostazol (PLETAL) 100 MG tablet TAKE 1 TABLET (100 MG TOTAL) BY MOUTH 2 (TWO) TIMES DAILY. 60 tablet 11  . methocarbamol (ROBAXIN) 500 MG tablet Take 1 tablet (500 mg total) by mouth 2 (two) times daily. 20 tablet 0  . rosuvastatin (CRESTOR) 10 MG tablet TAKE 1 TABLET (10 MG TOTAL) BY MOUTH DAILY. 30 tablet 6   No current facility-administered medications for this visit.      Allergies: No Known Allergies   Physical Exam: Blood pressure (!) 175/66,  pulse 70, temperature 98.7 F (37.1 C), temperature source Oral, resp. rate 18, height 5\' 9"  (1.753 m), weight 191 lb 9.6 oz (86.9 kg), SpO2 98 %.  ECOG: 1 General appearance: Alert, awake gentleman without distress. Head: Normocephalic, without obvious abnormality no ulcers or lesions. Lymph nodes: Cervical, supraclavicular, and axillary nodes normal. Heart: Regular rate and rhythm with a systolic murmur auscultated. Lung:chest clear, no wheezing,  rales, normal symmetric air entry. Chest wall examination showed no abnormalities noted on inspection.  Abdomen: soft, non-tender, without masses or organomegaly no shifting dullness or ascites. MEQ:ASTMH edema bilateral lower extremities Neurological examination: No deficits noted.  Lab Results: Lab Results  Component Value Date   WBC 7.3 03/04/2017   HGB 12.6 (L) 03/04/2017   HCT 37.5 (L) 03/04/2017   MCV 91.4 03/04/2017   PLT 199 03/04/2017     Chemistry      Component Value Date/Time   NA 144 11/26/2016 1307   K 3.5 11/26/2016 1307   CL 107 05/28/2016 1355   CL 106 02/01/2013 1120   CO2 27 11/26/2016 1307   BUN 12.2 11/26/2016 1307   CREATININE 0.9 11/26/2016 1307      Component Value Date/Time   CALCIUM 8.9 11/26/2016 1307   ALKPHOS 127 11/26/2016 1307   AST 9 11/26/2016 1307   ALT 7 11/26/2016 1307   BILITOT 0.79 11/26/2016 1307      Results for Alec, Harris (MRN [id number]) as of 03/04/2017 13:17  Ref. Range 08/21/2016 09:35 11/26/2016 13:07  Prostate Specific Ag, Serum Latest Ref Range: 0.0 - 4.0 ng/mL 4.4 (H) 6.4 (H)      Impression and Plan:   71 year old gentleman with the following issues:  1. Castration resistant prostate cancer with metastatic disease to the bone. He is status post multiple therapies in the past and most recently have been on [place] Since October 2013.  His PSA continues to rise slowly most recently at 6.4. He is sister developing worsening bone symptoms-year-old manageable at this time.  The plan is to continue with Zytiga at this time and uses different salvage therapy upon symptomatic progression. Options would include Xtandi and systemic chemotherapy. We will repeat bone scan before the next visit and determine the approach of treatment at the time.  2. Androgen deprivation. He remains on Lupron given at [facility] urology every 4 months. I  recommend continuing this indefinitely.  3. Bone directed therapy. Continue calcium and  vitamin D supplements. Bone directed therapy will be considered in the future. He has declined that in the past.  4. Anemia: Hemoglobin continues to be normal at this time.  5. Sternal pain: His continue be episodic although responding last lessTylenol and Advil. Given prescription for tramadol at this time.  6. Followup. In 3 months.    Zola Button, MD 7/18/20181:26 PM

## 2017-03-05 LAB — PSA: Prostate Specific Ag, Serum: 12.2 ng/mL — ABNORMAL HIGH (ref 0.0–4.0)

## 2017-04-06 ENCOUNTER — Telehealth: Payer: Self-pay | Admitting: Oncology

## 2017-04-06 NOTE — Telephone Encounter (Signed)
Call day moved from 10/3-10/4 informed patient

## 2017-04-21 ENCOUNTER — Other Ambulatory Visit: Payer: Self-pay | Admitting: *Deleted

## 2017-04-21 ENCOUNTER — Encounter: Payer: Self-pay | Admitting: *Deleted

## 2017-04-21 MED ORDER — ABIRATERONE ACETATE 250 MG PO TABS: 1000.0000 mg | ORAL_TABLET | Freq: Every day | ORAL | 2 refills | Status: DC

## 2017-04-29 ENCOUNTER — Ambulatory Visit (HOSPITAL_COMMUNITY)
Admission: EM | Admit: 2017-04-29 | Discharge: 2017-04-29 | Disposition: A | Discharge status: Home / Self Care | Payer: Medicare Other | Attending: Family Medicine | Admitting: Family Medicine

## 2017-04-29 ENCOUNTER — Encounter (HOSPITAL_COMMUNITY): Payer: Self-pay | Admitting: *Deleted

## 2017-04-29 DIAGNOSIS — S46319A Strain of muscle, fascia and tendon of triceps, unspecified arm, initial encounter: Secondary | ICD-10-CM | POA: Diagnosis not present

## 2017-04-29 MED ORDER — METHOCARBAMOL 500 MG PO TABS: 500.0000 mg | ORAL_TABLET | Freq: Two times a day (BID) | ORAL | 0 refills | Status: DC

## 2017-04-29 NOTE — ED Notes (Signed)
Pt  [facility]ed   [facility]    Does  Not  Have   Any  Robaxin      Dr  hagler  Notified      May  Change  To  Flexeril  10  Mg  Tid    Qty  15   Prn   nr   [facility]  [facility]

## 2017-04-29 NOTE — ED Provider Notes (Signed)
[place]   [id number] 04/29/17 Arrival Time: 1007  ASSESSMENT & PLAN:  1. Triceps strain, initial encounter    Meds ordered this encounter  Medications  . methocarbamol (ROBAXIN) 500 MG tablet    Sig: Take 1 tablet (500 mg total) by mouth 2 (two) times daily.    Dispense:  14 tablet    Refill:  0   Responded well last time to Robaxin. Sedation precautions. Will f/u as needed. Reviewed expectations re: course of current medical issues. Questions answered. Outlined signs and symptoms indicating need for more acute intervention. Patient verbalized understanding. After Visit Summary given.   SUBJECTIVE:  Alec Harris is a 71 y.o. 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 Couldn't sleep last night secondary to discomfort. Seen approx 3 months ago for the same and responded very well to Robaxin. No self treatment. No direct trauma reported. No extremity sensation changes or weakness.  ROS: As per HPI.   OBJECTIVE:  Vitals:   04/29/17 1055  BP: (!) 142/74  Pulse: 72  Resp: 18  Temp: 98.6 F (37 C)  TempSrc: Oral  SpO2: 100%    General appearance: alert; no distress Neck: supple with FROM Extremities: tender over mid R triceps muscle; no swelling, erythema, or bruising; no bunching of muscle Skin: warm and dry Psychological: alert and cooperative; normal mood and affect  No Known Allergies  Past Medical History:  Diagnosis Date  . Carotid artery disease ([place])    By doppler  . Claudication ([place]) 02/06/2012   ABI of 0.5 on both legs ,Rgt ext. iliac70 to 99%,rgt SFA occlude,lft ext.iliac common fem 70 to 99% ,Lft SFA occluded,mono flow popliteal  . Hyperlipemia   . PAD (peripheral artery disease) ([place]) 01/2012   severe diffuse his iliacs,SFA, carotids  . Prostate cancer ([place])   . Rectal bleeding 07/24/11   Diverticular Bleeding  . Tobacco abuse    Social History   Social History  . Marital  status: Married    Spouse name: N/A  . Number of children: N/A  . Years of education: N/A   Occupational History  . Not on file.   Social History Main Topics  . Smoking status: Light Tobacco Smoker    Years: 50.00    Types: Cigarettes  . Smokeless tobacco: Never Used     Comment: pt states that he is going to quit completely; 1/2 pack per day  . Alcohol use 3.0 oz/week    3 Cans of beer, 2 Shots of liquor per week  . Drug use: No  . Sexual activity: No     Comment: smokes 5 cigarettes per day....   Other Topics Concern  . Not on file   Social History Narrative  . No narrative on file   Family History  Problem Relation Age of Onset  . Hypertension Mother   . Heart attack Mother   . Heart disease Mother        CHF/  After age 17  . Heart attack Father   . Heart disease Father        Irregular Heart beat, Heart Disease before age 64  . Cancer Brother        throat   Past Surgical History:  Procedure Laterality Date  . CAROTID ANGIOGRAM  03/02/2012   bilateral carotid artery stenosis,95% right and 80% left with a type 2 arch  . CAROTID ANGIOGRAM N/A 03/02/2012   Procedure: CAROTID ANGIOGRAM;  Surgeon: Lorretta Harp, MD;  Location: [facility] CATH LAB;  Service: Cardiovascular;  Laterality: N/A;  . CAROTID ENDARTERECTOMY Right 04-08-12   CEA  . CAROTID STENT INSERTION Left 07/05/2013   Procedure: CAROTID STENT INSERTION;  Surgeon: Serafina Mitchell, MD;  Location: [facility] CATH LAB;  Service: Cardiovascular;  Laterality: Left;  . CERVICAL SPINE SURGERY  approx. 10 years ago   disc removed from neck  . COLONOSCOPY  07/25/2011   Procedure: COLONOSCOPY;  Surgeon: Beryle Beams;  Location: [facility] ENDOSCOPY;  Service: Endoscopy;  Laterality: N/A;  . DOPPLER ECHOCARDIOGRAPHY  03/24/2012   EF >70% LV normal  . ENDARTERECTOMY  04/08/2012   Procedure: ENDARTERECTOMY CAROTID;  Surgeon: Serafina Mitchell, MD;  Location: [place] OR;  Service: Vascular;  Laterality: Right;  Right carotid endarterectomy with  vascuguard 1cm x 6cm bovine patch angioplasty.   [name] KNEE CARTILAGE SURGERY     surgery for a torn ligament  . Lexiscan Myoview  01/14/2012   small fixed basal to mid inferior bowel attenuation artifact. no reversible ischemia  . LOWER EXTREMITY ANGIOGRAM N/A 03/02/2012   Procedure: LOWER EXTREMITY ANGIOGRAM;  Surgeon: Lorretta Harp, MD;  Location: [facility] CATH LAB;  Service: Cardiovascular;  Laterality: N/A;  . PENILE PROSTHESIS IMPLANT    . PROSTATE SURGERY  2003   for  prostate cancer  . PV angiogram  03/02/2012   total SFAs bilaterally with 3- vessel runoff below the knee, high-grade calcified iliac Kerry Kass, MD 04/29/17 1108

## 2017-04-29 NOTE — ED Triage Notes (Signed)
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

## 2017-05-18 ENCOUNTER — Ambulatory Visit (HOSPITAL_COMMUNITY)
Admission: RE | Admit: 2017-05-18 | Discharge: 2017-05-18 | Disposition: A | Discharge status: Home / Self Care | Payer: Medicare Other | Source: Ambulatory Visit | Attending: Oncology | Admitting: Oncology

## 2017-05-18 ENCOUNTER — Other Ambulatory Visit: Payer: Medicare Other

## 2017-05-18 ENCOUNTER — Encounter (HOSPITAL_COMMUNITY)
Admission: RE | Admit: 2017-05-18 | Discharge: 2017-05-18 | Disposition: A | Discharge status: Home / Self Care | Payer: Medicare Other | Source: Ambulatory Visit | Attending: Oncology | Admitting: Oncology

## 2017-05-18 DIAGNOSIS — C61 Malignant neoplasm of prostate: Secondary | ICD-10-CM | POA: Insufficient documentation

## 2017-05-18 DIAGNOSIS — C7951 Secondary malignant neoplasm of bone: Secondary | ICD-10-CM | POA: Diagnosis not present

## 2017-05-18 MED ORDER — TECHNETIUM TC 99M MEDRONATE IV KIT
20.5000 | PACK | Freq: Once | INTRAVENOUS | Status: AC | PRN
  Administered 2017-05-18: 20.5 via INTRAVENOUS

## 2017-05-20 ENCOUNTER — Ambulatory Visit: Payer: Medicare Other | Admitting: Oncology

## 2017-05-21 ENCOUNTER — Other Ambulatory Visit (HOSPITAL_BASED_OUTPATIENT_CLINIC_OR_DEPARTMENT_OTHER): Payer: Medicare Other

## 2017-05-21 ENCOUNTER — Telehealth: Payer: Self-pay | Admitting: Oncology

## 2017-05-21 ENCOUNTER — Other Ambulatory Visit: Payer: Self-pay | Admitting: *Deleted

## 2017-05-21 ENCOUNTER — Ambulatory Visit (HOSPITAL_BASED_OUTPATIENT_CLINIC_OR_DEPARTMENT_OTHER): Payer: Medicare Other | Admitting: Oncology

## 2017-05-21 ENCOUNTER — Encounter: Payer: Self-pay | Admitting: *Deleted

## 2017-05-21 VITALS — BP 180/66 | HR 90 | Temp 98.2°F | Resp 18 | Ht 69.0 in | Wt 182.5 lb

## 2017-05-21 DIAGNOSIS — C61 Malignant neoplasm of prostate: Secondary | ICD-10-CM | POA: Diagnosis not present

## 2017-05-21 DIAGNOSIS — D649 Anemia, unspecified: Secondary | ICD-10-CM

## 2017-05-21 DIAGNOSIS — C7951 Secondary malignant neoplasm of bone: Secondary | ICD-10-CM | POA: Diagnosis not present

## 2017-05-21 DIAGNOSIS — C779 Secondary and unspecified malignant neoplasm of lymph node, unspecified: Secondary | ICD-10-CM

## 2017-05-21 DIAGNOSIS — Z7189 Other specified counseling: Secondary | ICD-10-CM | POA: Insufficient documentation

## 2017-05-21 LAB — COMPREHENSIVE METABOLIC PANEL
ALT: 7 U/L (ref 0–55)
ANION GAP: 9 meq/L (ref 3–11)
AST: 12 U/L (ref 5–34)
Albumin: 3.1 g/dL — ABNORMAL LOW (ref 3.5–5.0)
Alkaline Phosphatase: 362 U/L — ABNORMAL HIGH (ref 40–150)
BUN: 10.8 mg/dL (ref 7.0–26.0)
CHLORIDE: 109 meq/L (ref 98–109)
CO2: 26 meq/L (ref 22–29)
CREATININE: 0.9 mg/dL (ref 0.7–1.3)
Calcium: 9.1 mg/dL (ref 8.4–10.4)
GLUCOSE: 126 mg/dL (ref 70–140)
Potassium: 2.7 mEq/L — CL (ref 3.5–5.1)
SODIUM: 145 meq/L (ref 136–145)
Total Bilirubin: 0.82 mg/dL (ref 0.20–1.20)
Total Protein: 6.9 g/dL (ref 6.4–8.3)

## 2017-05-21 LAB — CBC WITH DIFFERENTIAL/PLATELET
BASO%: 0.5 % (ref 0.0–2.0)
Basophils Absolute: 0 10*3/uL (ref 0.0–0.1)
EOS%: 2 % (ref 0.0–7.0)
Eosinophils Absolute: 0.1 10*3/uL (ref 0.0–0.5)
HCT: 35.2 % — ABNORMAL LOW (ref 38.4–49.9)
HGB: 12 g/dL — ABNORMAL LOW (ref 13.0–17.1)
LYMPH%: 42.5 % (ref 14.0–49.0)
MCH: 30.5 pg (ref 27.2–33.4)
MCHC: 34.1 g/dL (ref 32.0–36.0)
MCV: 89.6 fL (ref 79.3–98.0)
MONO#: 0.6 10*3/uL (ref 0.1–0.9)
MONO%: 8.3 % (ref 0.0–14.0)
NEUT%: 46.7 % (ref 39.0–75.0)
NEUTROS ABS: 3.1 10*3/uL (ref 1.5–6.5)
PLATELETS: 243 10*3/uL (ref 140–400)
RBC: 3.93 10*6/uL — ABNORMAL LOW (ref 4.20–5.82)
RDW: 16.3 % — ABNORMAL HIGH (ref 11.0–14.6)
WBC: 6.7 10*3/uL (ref 4.0–10.3)
lymph#: 2.8 10*3/uL (ref 0.9–3.3)

## 2017-05-21 MED ORDER — POTASSIUM CHLORIDE CRYS ER 20 MEQ PO TBCR: 20.0000 meq | EXTENDED_RELEASE_TABLET | Freq: Every day | ORAL | 0 refills | Status: DC

## 2017-05-21 MED ORDER — LIDOCAINE-PRILOCAINE 2.5-2.5 % EX CREA: 1.0000 "application " | TOPICAL_CREAM | CUTANEOUS | 0 refills | Status: AC | PRN

## 2017-05-21 MED ORDER — PROCHLORPERAZINE MALEATE 10 MG PO TABS: 10.0000 mg | ORAL_TABLET | Freq: Four times a day (QID) | ORAL | 0 refills | Status: DC | PRN

## 2017-05-21 NOTE — Progress Notes (Signed)
Hematology and Oncology Follow Up Visit  Alec Harris [id number] 08-Feb-1946 71 y.o. 05/21/2017 9:23 AM Patient, No Pcp PerNo ref. provider found   Principle Diagnosis: 71 year old with Castration resistant prostate cancer. He was diagnosed in 2003 with Gleason score 4 + 4 = 8. Now he has metastatic disease with involvement of lymph nodes and bone.  Prior Therapy:   He underwent a prostatectomy with a pathology that showed prostatic adenocarcinoma. Gleason score 4+4 equals 8, with the tumor involving the margins and the pathologic staging was pT4 N1 with 1 of 2 left pelvic lymph nodes involved.  He was started on adjuvant hormone therapy with Lupron. He developed a rising PSA and bone metastasis.  He was then treated with ketoconazole and prednisone. He developed a rising PSA with a doubling time of just over 6 months. Bone scan in September 2013 showed increased uptake in the right lower lumbar spine at L4 and L5 and abnormal uptake in the mid sternum which had progressed since the prior bone scan.  Current therapy:  Zytiga 1000 mg daily started in October 2013.  Lupron given at at [facility] urology.  Interim History: Alec Harris presents today for a follow-up visit. Since his last visit, he reports slight decline in his health. He is reporting decrease in his appetite and slight increase in his bone pain. He is reporting diffuse arthralgias predominantly in his lower extremities and very little sternal pain. He denied any nausea, vomiting or falls. He still ambulating without any difficulties. He denied any falls or syncope.  He continues to take Zytiga without any new complications. He denied any hematuria or dysuria. He denied any edema. He has no difficulties obtaining this medication. He takes very little to no pain medication at this time.  He denies any headaches blurred vision or double vision. He denied any chest pain, palpitation or leg edema. He reports no nausea or vomiting. Denies  hematuria or other evidence of bleeding.  Has not reported any cough or hemoptysis or hematemesis. Does not report any hematochezia or melena. Does not report any frequency urgency or hesitancy. Does not report any rash or lesions. Does not report any petechiae. Remainder of his review of systems was unremarkable.  Medications: I have reviewed the patient's current medications. No changes on my review today. Current Outpatient Prescriptions  Medication Sig Dispense Refill  . abiraterone Acetate (ZYTIGA) 250 MG tablet Take 4 tablets (1,000 mg total) by mouth daily. Take on an empty stomach 1 hour before or 2 hours after a meal 120 tablet 2  . amLODipine (NORVASC) 5 MG tablet TAKE 1 TABLET (5 MG TOTAL) BY MOUTH DAILY. 90 tablet 3  . aspirin EC 81 MG tablet Take 81 mg by mouth daily.    [name] atorvastatin (LIPITOR) 20 MG tablet Take 1 tablet (20 mg total) by mouth daily. 90 tablet 1  . Calcium Carb-Cholecalciferol (CALCIUM 500 +D) 500-400 MG-UNIT TABS Take 1 tablet by mouth 2 (two) times daily.    . cilostazol (PLETAL) 100 MG tablet TAKE 1 TABLET (100 MG TOTAL) BY MOUTH 2 (TWO) TIMES DAILY. 60 tablet 11  . lidocaine-prilocaine (EMLA) cream Apply 1 application topically as needed. 30 g 0  . methocarbamol (ROBAXIN) 500 MG tablet Take 1 tablet (500 mg total) by mouth 2 (two) times daily. 14 tablet 0  . prochlorperazine (COMPAZINE) 10 MG tablet Take 1 tablet (10 mg total) by mouth every 6 (six) hours as needed for nausea or vomiting. 30 tablet 0  .  rosuvastatin (CRESTOR) 10 MG tablet TAKE 1 TABLET (10 MG TOTAL) BY MOUTH DAILY. 30 tablet 6  . traMADol (ULTRAM) 50 MG tablet Take 1 tablet (50 mg total) by mouth every 6 (six) hours as needed. 30 tablet 1   No current facility-administered medications for this visit.      Allergies: No Known Allergies   Physical Exam: Blood pressure (!) 180/66, pulse 90, temperature 98.2 F (36.8 C), temperature source Oral, resp. rate 18, height 5\' 9"  (1.753 m), weight  182 lb 8 oz (82.8 kg), SpO2 99 %.  ECOG: 1 General appearance: Well-appearing gentleman appeared without distress. Head: Normocephalic, without obvious abnormality no oral thrush or ulcers. Lymph nodes: Cervical, supraclavicular, and axillary nodes normal. Heart: Regular rate and rhythm with a systolic murmur auscultated. Lung:chest clear, no wheezing, rales, normal symmetric air entry. Chest wall examination showed no masses palpated. Abdomen: soft, non-tender, without masses or organomegaly no rebound or guarding. RJJ:OACZY edema bilateral lower extremities Neurological examination: No deficits noted.  Lab Results: Lab Results  Component Value Date   WBC 6.7 05/21/2017   HGB 12.0 (L) 05/21/2017   HCT 35.2 (L) 05/21/2017   MCV 89.6 05/21/2017   PLT 243 05/21/2017     Chemistry      Component Value Date/Time   NA 143 03/04/2017 1304   K 3.8 03/04/2017 1304   CL 107 05/28/2016 1355   CL 106 02/01/2013 1120   CO2 26 03/04/2017 1304   BUN 10.5 03/04/2017 1304   CREATININE 0.9 03/04/2017 1304      Component Value Date/Time   CALCIUM 8.9 03/04/2017 1304   ALKPHOS 203 (H) 03/04/2017 1304   AST 10 03/04/2017 1304   ALT <6 03/04/2017 1304   BILITOT 0.75 03/04/2017 1304     EXAM: NUCLEAR MEDICINE WHOLE BODY BONE SCAN  TECHNIQUE: Whole body anterior and posterior images were obtained approximately 3 hours after intravenous injection of radiopharmaceutical.  RADIOPHARMACEUTICALS:  20.5 mCi Technetium-62m MDP IV  COMPARISON:  Nuclear bone scan of August 21, 2016.  FINDINGS: There is adequate uptake of the radiopharmaceutical by the skeleton. There is adequate soft tissue clearance and renal activity.  There is new increased uptake in the left high frontal-anterior parietal bone. There is stable intensely increased uptake within the sternum and manubrium. There foci of increased uptake in the anterior aspect of the right third or fourth rib in the left second or  third rib. There is intensely increased uptake in the distal aspect of the right clavicle which is new. A focus of increased uptake in the midshaft of the right humerus has markedly increased in size. There is a tiny focus of increased uptake in the distal aspect of the left humeral shaft which is new. There is intensely increased uptake in the body of L2 or L3 to the left of midline. There is increased uptake in the femoral neck of the left hip which is new. There is new intertrochanteric uptake in the left hip. On the right there is a large mid femoral focus of increased uptake that is markedly enlarged since the previous study.  IMPRESSION: Marked progression of skeletal metastatic disease. The patient is at increased risk for acute fractures of the midshaft of the right humerus, mid shaft of the right femur, and intertrochanteric region of the left femur. Plain films of these regions would be useful.  Results for Alec Harris, Alec Harris (MRN [id number]) as of 05/21/2017 09:00  Ref. Range 11/26/2016 13:07 03/04/2017 13:04  Prostate Specific Ag, Serum  Latest Ref Range: 0.0 - 4.0 ng/mL 6.4 (H) 12.2 (H)     Impression and Plan:   71 year old gentleman with the following issues:  1. Castration resistant prostate cancer with metastatic disease to the bone.   He is status post Fabio Asa that was started on October 2013. Therapy to be discontinued in October 2018 because of progression of disease.  His PSA in July 2018 was 12.2 and bone scan obtained on 05/18/2017 showed marked progression of disease.  The natural course of this disease was discussed today with the patient and treatment options were reviewed. He appears to be developing rapid progression of his disease that is symptomatic. He is reporting weight loss and slight increase in his arthralgias. I feel that systemic chemotherapy is his best option at this time. Risks and benefits associated with Taxotere chemotherapy were reviewed.  Complications include nausea, fatigue, myelosuppression, neutropenia, neutropenic sepsis, peripheral neuropathy and rarely hospitalizations and death. The benefit would be improving his overall disease status, improvement in his overall survival and palliation of pain.  After discussing the risks and benefits is agreeable to proceed with Taxotere 75 mg/m every 3 weeks after chemotherapy education class.  2. Androgen deprivation. He remains on Lupron given at [facility] urology every 4 months. I  recommend continuing this indefinitely.  3. Bone directed therapy. Continue calcium and vitamin D supplements. He would be a candidate for Xgeva after obtaining dental clearance. We will continue to address that with him after to start of chemotherapy.  4. Anemia: Hemoglobin is normal at this time with adequate white cell count and platelet count.  5. Sternal pain: Manageable at this time in addition to other arthralgias she is experiencing.  6. IV access: Risks and benefits of Port-A-Cath insertion was discussed today. Complications include bleeding, infection and thrombosis were reviewed. He is agreeable to proceed before the start of chemotherapy. EMLA cream will be called in as well.  7. Antiemetics: Prescription for Compazine was given to the patient.  8. Neutropenia prophylaxis: Neulasta will be added to each chemotherapy cycle.  9. Followup. We'll be in the next 2 weeks to start systemic chemotherapy.    Zola Button, MD 10/4/20189:23 AM

## 2017-05-21 NOTE — Telephone Encounter (Signed)
Scheduled appt per 10/4 los - Gave patient AVS and calender per los.  

## 2017-05-21 NOTE — Progress Notes (Signed)
START ON PATHWAY REGIMEN - Prostate     A cycle is every 21 days:     Docetaxel      Prednisone   **Always confirm dose/schedule in your pharmacy ordering system**    Patient Characteristics: Adenocarcinoma, Metastatic, Castration Resistant, Symptomatic, Docetaxel Eligible Current radiographic evidence of distant metastasis<= Yes Histology: Adenocarcinoma AJCC T Category: cTX Gleason Primary: X AJCC N Category: NX Gleason Secondary: X AJCC M Category: M1c Gleason Score: X AJCC 8 Stage Grouping: IVB PSA Values (ng/mL): ? 10 < 20 Would you be surprised if this patient died  in the next year<= I would be surprised if this patient died in the next year Intent of Therapy: Non-Curative / Palliative Intent, Discussed with Patient

## 2017-05-22 LAB — TESTOSTERONE: Testosterone, Serum: <3 ng/dL — ABNORMAL LOW (ref 264–916)

## 2017-05-22 LAB — PSA: PROSTATE SPECIFIC AG, SERUM: 20.3 ng/mL — AB (ref 0.0–4.0)

## 2017-05-26 ENCOUNTER — Other Ambulatory Visit: Payer: Medicare Other

## 2017-05-26 ENCOUNTER — Telehealth: Payer: Self-pay | Admitting: *Deleted

## 2017-05-26 ENCOUNTER — Encounter: Payer: Self-pay | Admitting: *Deleted

## 2017-05-26 NOTE — Telephone Encounter (Signed)
Patient stated in chemo class that insurance denied emla.  Worried about paying the 50 dollars. Called [facility] [address], they state it has been sent for PA.

## 2017-05-27 ENCOUNTER — Encounter: Payer: Self-pay | Admitting: Oncology

## 2017-05-27 NOTE — Progress Notes (Signed)
Received PA approval from [employer] for Lidocaine-Prilocaine cream via fax.  PA approved 02/26/17-08/25/17. Called [facility](J[facility]) to advise of approval. He states ok it went through and will get it ready.

## 2017-05-27 NOTE — Progress Notes (Signed)
Received PA request for Lidocaine-Prilocaine cream.  Submitted via Cover My Meds.  Alec Harris Key: [id number] - PA Case ID: [id number] - Rx #: [id number] Need help? Call us at [phone]  Status  Sent to [employer]  DrugLidocaine-Prilocaine 2.5-2.5% EX CREA  [facility] Electronic PA Form  Original Claim [id number] PA REQUIRED-PRESCRIBER [facility] REQUIRES PRIOR AUTHORIZATION   Pending determination

## 2017-05-27 NOTE — Progress Notes (Signed)
Received PA determination as denied due to clinical answer reading incorrectly.  Called [employer](Hannah) and was transferred to [place](Clinical pharmacist). Corrected answer and PA was approved for one year [phone] for Lidocaine/Prilocaine cream. Decision will be sent via fax also.

## 2017-05-28 ENCOUNTER — Telehealth: Payer: Self-pay | Admitting: *Deleted

## 2017-05-28 NOTE — Telephone Encounter (Signed)
Documentation for prior auth for emla cream faxed to [employer] [phone]

## 2017-05-29 ENCOUNTER — Other Ambulatory Visit: Payer: Self-pay | Admitting: Radiology

## 2017-05-31 ENCOUNTER — Other Ambulatory Visit: Payer: Self-pay | Admitting: Radiology

## 2017-06-01 ENCOUNTER — Ambulatory Visit (HOSPITAL_COMMUNITY)
Admission: RE | Admit: 2017-06-01 | Discharge: 2017-06-01 | Disposition: A | Discharge status: Home / Self Care | Payer: Medicare Other | Source: Ambulatory Visit | Attending: Oncology | Admitting: Oncology

## 2017-06-01 ENCOUNTER — Encounter (HOSPITAL_COMMUNITY): Payer: Self-pay

## 2017-06-01 ENCOUNTER — Ambulatory Visit (HOSPITAL_COMMUNITY): Admission: RE | Admit: 2017-06-01 | Payer: Medicare Other | Source: Ambulatory Visit

## 2017-06-01 DIAGNOSIS — C61 Malignant neoplasm of prostate: Secondary | ICD-10-CM

## 2017-06-02 ENCOUNTER — Encounter: Payer: Self-pay | Admitting: *Deleted

## 2017-06-02 ENCOUNTER — Ambulatory Visit (HOSPITAL_BASED_OUTPATIENT_CLINIC_OR_DEPARTMENT_OTHER): Payer: Medicare Other

## 2017-06-02 ENCOUNTER — Other Ambulatory Visit (HOSPITAL_BASED_OUTPATIENT_CLINIC_OR_DEPARTMENT_OTHER): Payer: Medicare Other

## 2017-06-02 ENCOUNTER — Other Ambulatory Visit: Payer: Self-pay | Admitting: Oncology

## 2017-06-02 ENCOUNTER — Ambulatory Visit: Payer: Medicare Other

## 2017-06-02 ENCOUNTER — Telehealth: Payer: Self-pay | Admitting: *Deleted

## 2017-06-02 ENCOUNTER — Other Ambulatory Visit: Payer: Self-pay | Admitting: *Deleted

## 2017-06-02 VITALS — BP 183/79 | HR 63 | Temp 98.6°F | Resp 18

## 2017-06-02 DIAGNOSIS — Z5111 Encounter for antineoplastic chemotherapy: Secondary | ICD-10-CM

## 2017-06-02 DIAGNOSIS — C61 Malignant neoplasm of prostate: Secondary | ICD-10-CM

## 2017-06-02 DIAGNOSIS — C7951 Secondary malignant neoplasm of bone: Secondary | ICD-10-CM

## 2017-06-02 LAB — CBC WITH DIFFERENTIAL/PLATELET
BASO%: 0.3 % (ref 0.0–2.0)
Basophils Absolute: 0 10*3/uL (ref 0.0–0.1)
EOS ABS: 0.1 10*3/uL (ref 0.0–0.5)
EOS%: 1.4 % (ref 0.0–7.0)
HEMATOCRIT: 38.1 % — AB (ref 38.4–49.9)
HEMOGLOBIN: 12.8 g/dL — AB (ref 13.0–17.1)
LYMPH%: 27.9 % (ref 14.0–49.0)
MCH: 30.8 pg (ref 27.2–33.4)
MCHC: 33.7 g/dL (ref 32.0–36.0)
MCV: 91.3 fL (ref 79.3–98.0)
MONO#: 0.5 10*3/uL (ref 0.1–0.9)
MONO%: 7.3 % (ref 0.0–14.0)
NEUT%: 63.1 % (ref 39.0–75.0)
NEUTROS ABS: 4.5 10*3/uL (ref 1.5–6.5)
PLATELETS: 198 10*3/uL (ref 140–400)
RBC: 4.17 10*6/uL — ABNORMAL LOW (ref 4.20–5.82)
RDW: 16.6 % — AB (ref 11.0–14.6)
WBC: 7.1 10*3/uL (ref 4.0–10.3)
lymph#: 2 10*3/uL (ref 0.9–3.3)

## 2017-06-02 LAB — COMPREHENSIVE METABOLIC PANEL
ALBUMIN: 3 g/dL — AB (ref 3.5–5.0)
ALT: <6 U/L (ref 0–55)
ANION GAP: 10 meq/L (ref 3–11)
AST: 12 U/L (ref 5–34)
Alkaline Phosphatase: 365 U/L — ABNORMAL HIGH (ref 40–150)
BUN: 11.3 mg/dL (ref 7.0–26.0)
CO2: 24 mEq/L (ref 22–29)
Calcium: 9 mg/dL (ref 8.4–10.4)
Chloride: 110 mEq/L — ABNORMAL HIGH (ref 98–109)
Creatinine: 0.8 mg/dL (ref 0.7–1.3)
GLUCOSE: 162 mg/dL — AB (ref 70–140)
Potassium: 3 mEq/L — CL (ref 3.5–5.1)
Sodium: 145 mEq/L (ref 136–145)
TOTAL PROTEIN: 6.9 g/dL (ref 6.4–8.3)
Total Bilirubin: 0.76 mg/dL (ref 0.20–1.20)

## 2017-06-02 MED ORDER — POTASSIUM CHLORIDE CRYS ER 20 MEQ PO TBCR: 20.0000 meq | EXTENDED_RELEASE_TABLET | Freq: Every day | ORAL | 0 refills | Status: DC

## 2017-06-02 MED ORDER — DEXAMETHASONE SODIUM PHOSPHATE 10 MG/ML IJ SOLN
INTRAMUSCULAR | Status: AC
  Filled 2017-06-02: qty 1

## 2017-06-02 MED ORDER — SODIUM CHLORIDE 0.9 % IV SOLN
Freq: Once | INTRAVENOUS | Status: AC
  Administered 2017-06-02: 11:00:00 via INTRAVENOUS

## 2017-06-02 MED ORDER — SODIUM CHLORIDE 0.9 % IV SOLN
75.0000 mg/m2 | Freq: Once | INTRAVENOUS | Status: AC
  Administered 2017-06-02: 150 mg via INTRAVENOUS
  Filled 2017-06-02: qty 15

## 2017-06-02 MED ORDER — SODIUM CHLORIDE 0.9 % IV SOLN: 10.0000 mg | Freq: Once | INTRAVENOUS | Status: DC

## 2017-06-02 MED ORDER — DEXAMETHASONE SODIUM PHOSPHATE 10 MG/ML IJ SOLN
10.0000 mg | Freq: Once | INTRAMUSCULAR | Status: AC
Start: 2017-06-02 — End: 2017-06-02
  Administered 2017-06-02: 10 mg via INTRAVENOUS

## 2017-06-02 NOTE — Patient Instructions (Signed)
Docetaxel injection What is this medicine? DOCETAXEL (doe se TAX el) is a chemotherapy drug. It targets fast dividing cells, like cancer cells, and causes these cells to die. This medicine is used to treat many types of cancers like breast cancer, certain stomach cancers, head and neck cancer, lung cancer, and prostate cancer. This medicine may be used for other purposes; ask your health care provider or pharmacist if you have questions. COMMON BRAND NAME(S): Docefrez, Taxotere What should I tell my health care provider before I take this medicine? They need to know if you have any of these conditions: -infection (especially a virus infection such as chickenpox, cold sores, or herpes) -liver disease -low blood counts, like low white cell, platelet, or red cell counts -an unusual or allergic reaction to docetaxel, polysorbate 80, other chemotherapy agents, other medicines, foods, dyes, or preservatives -pregnant or trying to get pregnant -breast-feeding How should I use this medicine? This drug is given as an infusion into a vein. It is administered in a hospital or clinic by a specially trained health care professional. Talk to your pediatrician regarding the use of this medicine in children. Special care may be needed. Overdosage: If you think you have taken too much of this medicine contact a poison control center or emergency room at once. NOTE: This medicine is only for you. Do not share this medicine with others. What if I miss a dose? It is important not to miss your dose. Call your doctor or health care professional if you are unable to keep an appointment. What may interact with this medicine? -cyclosporine -erythromycin -ketoconazole -medicines to increase blood counts like filgrastim, pegfilgrastim, sargramostim -vaccines Talk to your doctor or health care professional before taking any of these medicines: -acetaminophen -aspirin -ibuprofen -ketoprofen -naproxen This list  may not describe all possible interactions. Give your health care provider a list of all the medicines, herbs, non-prescription drugs, or dietary supplements you use. Also tell them if you smoke, drink alcohol, or use illegal drugs. Some items may interact with your medicine. What should I watch for while using this medicine? Your condition will be monitored carefully while you are receiving this medicine. You will need important blood work done while you are taking this medicine. This drug may make you feel generally unwell. This is not uncommon, as chemotherapy can affect healthy cells as well as cancer cells. Report any side effects. Continue your course of treatment even though you feel ill unless your doctor tells you to stop. In some cases, you may be given additional medicines to help with side effects. Follow all directions for their use. Call your doctor or health care professional for advice if you get a fever, chills or sore throat, or other symptoms of a cold or flu. Do not treat yourself. This drug decreases your body's ability to fight infections. Try to avoid being around people who are sick. This medicine may increase your risk to bruise or bleed. Call your doctor or health care professional if you notice any unusual bleeding. This medicine may contain alcohol in the product. You may get drowsy or dizzy. Do not drive, use machinery, or do anything that needs mental alertness until you know how this medicine affects you. Do not stand or sit up quickly, especially if you are an older patient. This reduces the risk of dizzy or fainting spells. Avoid alcoholic drinks. Do not become pregnant while taking this medicine. Women should inform their doctor if they wish to become pregnant or   think they might be pregnant. There is a potential for serious side effects to an unborn child. Talk to your health care professional or pharmacist for more information. Do not breast-feed an infant while taking  this medicine. What side effects may I notice from receiving this medicine? Side effects that you should report to your doctor or health care professional as soon as possible: -allergic reactions like skin rash, itching or hives, swelling of the face, lips, or tongue -low blood counts - This drug may decrease the number of white blood cells, red blood cells and platelets. You may be at increased risk for infections and bleeding. -signs of infection - fever or chills, cough, sore throat, pain or difficulty passing urine -signs of decreased platelets or bleeding - bruising, pinpoint red spots on the skin, black, tarry stools, nosebleeds -signs of decreased red blood cells - unusually weak or tired, fainting spells, lightheadedness -breathing problems -fast or irregular heartbeat -low blood pressure -mouth sores -nausea and vomiting -pain, swelling, redness or irritation at the injection site -pain, tingling, numbness in the hands or feet -swelling of the ankle, feet, hands -weight gain Side effects that usually do not require medical attention (report to your doctor or health care professional if they continue or are bothersome): -bone pain -complete hair loss including hair on your head, underarms, pubic hair, eyebrows, and eyelashes -diarrhea -excessive tearing -changes in the color of fingernails -loosening of the fingernails -nausea -muscle pain -red flush to skin -sweating -weak or tired This list may not describe all possible side effects. Call your doctor for medical advice about side effects. You may report side effects to FDA at 1-800-FDA-1088. Where should I keep my medicine? This drug is given in a hospital or clinic and will not be stored at home. NOTE: This sheet is a summary. It may not cover all possible information. If you have questions about this medicine, talk to your doctor, pharmacist, or health care provider.  2018 Elsevier/Gold Standard (2015-09-06 12:32:56)  

## 2017-06-02 NOTE — Progress Notes (Signed)
Okay to treat with K+ level of 3.0  Dr [name] Blew to order oral supplement.

## 2017-06-02 NOTE — Telephone Encounter (Signed)
Per dr [name] Blew, potassium 20 meq e-scribed to [employer] [facility] [address]. Patient notified.

## 2017-06-02 NOTE — Progress Notes (Signed)
Patient has not had port placed as of today- surgery scheduled 10/19- no need for this appointment. Patient had labs drawn peripherally.

## 2017-06-03 ENCOUNTER — Other Ambulatory Visit: Payer: Self-pay | Admitting: Radiology

## 2017-06-03 ENCOUNTER — Ambulatory Visit (HOSPITAL_BASED_OUTPATIENT_CLINIC_OR_DEPARTMENT_OTHER): Payer: Medicare Other

## 2017-06-03 VITALS — BP 156/83 | HR 60 | Temp 98.2°F | Resp 20

## 2017-06-03 DIAGNOSIS — C7951 Secondary malignant neoplasm of bone: Secondary | ICD-10-CM

## 2017-06-03 DIAGNOSIS — Z5189 Encounter for other specified aftercare: Secondary | ICD-10-CM | POA: Diagnosis not present

## 2017-06-03 DIAGNOSIS — C61 Malignant neoplasm of prostate: Secondary | ICD-10-CM | POA: Diagnosis present

## 2017-06-03 MED ORDER — PEGFILGRASTIM INJECTION 6 MG/0.6ML ~~LOC~~
6.0000 mg | PREFILLED_SYRINGE | Freq: Once | SUBCUTANEOUS | Status: AC
  Administered 2017-06-03: 6 mg via SUBCUTANEOUS
  Filled 2017-06-03: qty 0.6

## 2017-06-03 NOTE — Patient Instructions (Signed)
Pegfilgrastim injection What is this medicine? PEGFILGRASTIM (PEG fil gra stim) is a long-acting granulocyte colony-stimulating factor that stimulates the growth of neutrophils, a type of white blood cell important in the body's fight against infection. It is used to reduce the incidence of fever and infection in patients with certain types of cancer who are receiving chemotherapy that affects the bone marrow, and to increase survival after being exposed to high doses of radiation. This medicine may be used for other purposes; ask your health care provider or pharmacist if you have questions. COMMON BRAND NAME(S): Neulasta What should I tell my health care provider before I take this medicine? They need to know if you have any of these conditions: -kidney disease -latex allergy -ongoing radiation therapy -sickle cell disease -skin reactions to acrylic adhesives (On-Body Injector only) -an unusual or allergic reaction to pegfilgrastim, filgrastim, other medicines, foods, dyes, or preservatives -pregnant or trying to get pregnant -breast-feeding How should I use this medicine? This medicine is for injection under the skin. If you get this medicine at home, you will be taught how to prepare and give the pre-filled syringe or how to use the On-body Injector. Refer to the patient Instructions for Use for detailed instructions. Use exactly as directed. Tell your healthcare provider immediately if you suspect that the On-body Injector may not have performed as intended or if you suspect the use of the On-body Injector resulted in a missed or partial dose. It is important that you put your used needles and syringes in a special sharps container. Do not put them in a trash can. If you do not have a sharps container, call your pharmacist or healthcare provider to get one. Talk to your pediatrician regarding the use of this medicine in children. While this drug may be prescribed for selected conditions,  precautions do apply. Overdosage: If you think you have taken too much of this medicine contact a poison control center or emergency room at once. NOTE: This medicine is only for you. Do not share this medicine with others. What if I miss a dose? It is important not to miss your dose. Call your doctor or health care professional if you miss your dose. If you miss a dose due to an On-body Injector failure or leakage, a new dose should be administered as soon as possible using a single prefilled syringe for manual use. What may interact with this medicine? Interactions have not been studied. Give your health care provider a list of all the medicines, herbs, non-prescription drugs, or dietary supplements you use. Also tell them if you smoke, drink alcohol, or use illegal drugs. Some items may interact with your medicine. This list may not describe all possible interactions. Give your health care provider a list of all the medicines, herbs, non-prescription drugs, or dietary supplements you use. Also tell them if you smoke, drink alcohol, or use illegal drugs. Some items may interact with your medicine. What should I watch for while using this medicine? You may need blood work done while you are taking this medicine. If you are going to need a MRI, CT scan, or other procedure, tell your doctor that you are using this medicine (On-Body Injector only). What side effects may I notice from receiving this medicine? Side effects that you should report to your doctor or health care professional as soon as possible: -allergic reactions like skin rash, itching or hives, swelling of the face, lips, or tongue -dizziness -fever -pain, redness, or irritation at site   where injected -pinpoint red spots on the skin -red or dark-brown urine -shortness of breath or breathing problems -stomach or side pain, or pain at the shoulder -swelling -tiredness -trouble passing urine or change in the amount of urine Side  effects that usually do not require medical attention (report to your doctor or health care professional if they continue or are bothersome): -bone pain -muscle pain This list may not describe all possible side effects. Call your doctor for medical advice about side effects. You may report side effects to FDA at 1-800-FDA-1088. Where should I keep my medicine? Keep out of the reach of children. Store pre-filled syringes in a refrigerator between 2 and 8 degrees C (36 and 46 degrees F). Do not freeze. Keep in carton to protect from light. Throw away this medicine if it is left out of the refrigerator for more than 48 hours. Throw away any unused medicine after the expiration date. NOTE: This sheet is a summary. It may not cover all possible information. If you have questions about this medicine, talk to your doctor, pharmacist, or health care provider.  2018 Elsevier/Gold Standard (2016-07-31 12:58:03)  

## 2017-06-04 ENCOUNTER — Other Ambulatory Visit: Payer: Self-pay | Admitting: General Surgery

## 2017-06-04 ENCOUNTER — Other Ambulatory Visit: Payer: Self-pay | Admitting: Radiology

## 2017-06-05 ENCOUNTER — Ambulatory Visit (HOSPITAL_COMMUNITY)
Admission: RE | Admit: 2017-06-05 | Discharge: 2017-06-05 | Disposition: A | Discharge status: Home / Self Care | Payer: Medicare Other | Source: Ambulatory Visit | Attending: Oncology | Admitting: Oncology

## 2017-06-05 ENCOUNTER — Telehealth: Payer: Self-pay | Admitting: *Deleted

## 2017-06-05 ENCOUNTER — Other Ambulatory Visit: Payer: Self-pay | Admitting: Oncology

## 2017-06-05 ENCOUNTER — Other Ambulatory Visit: Payer: Self-pay | Admitting: Student

## 2017-06-05 ENCOUNTER — Encounter: Payer: Self-pay | Admitting: *Deleted

## 2017-06-05 ENCOUNTER — Encounter (HOSPITAL_COMMUNITY): Payer: Self-pay

## 2017-06-05 DIAGNOSIS — F17210 Nicotine dependence, cigarettes, uncomplicated: Secondary | ICD-10-CM | POA: Diagnosis not present

## 2017-06-05 DIAGNOSIS — I6523 Occlusion and stenosis of bilateral carotid arteries: Secondary | ICD-10-CM | POA: Diagnosis not present

## 2017-06-05 DIAGNOSIS — E785 Hyperlipidemia, unspecified: Secondary | ICD-10-CM | POA: Diagnosis not present

## 2017-06-05 DIAGNOSIS — Z8546 Personal history of malignant neoplasm of prostate: Secondary | ICD-10-CM | POA: Insufficient documentation

## 2017-06-05 DIAGNOSIS — C779 Secondary and unspecified malignant neoplasm of lymph node, unspecified: Secondary | ICD-10-CM | POA: Diagnosis not present

## 2017-06-05 DIAGNOSIS — Z808 Family history of malignant neoplasm of other organs or systems: Secondary | ICD-10-CM | POA: Insufficient documentation

## 2017-06-05 DIAGNOSIS — Z9079 Acquired absence of other genital organ(s): Secondary | ICD-10-CM | POA: Diagnosis not present

## 2017-06-05 DIAGNOSIS — C61 Malignant neoplasm of prostate: Secondary | ICD-10-CM

## 2017-06-05 DIAGNOSIS — Z79899 Other long term (current) drug therapy: Secondary | ICD-10-CM | POA: Insufficient documentation

## 2017-06-05 DIAGNOSIS — I739 Peripheral vascular disease, unspecified: Secondary | ICD-10-CM | POA: Insufficient documentation

## 2017-06-05 DIAGNOSIS — Z7982 Long term (current) use of aspirin: Secondary | ICD-10-CM | POA: Insufficient documentation

## 2017-06-05 DIAGNOSIS — Z5111 Encounter for antineoplastic chemotherapy: Secondary | ICD-10-CM | POA: Diagnosis not present

## 2017-06-05 DIAGNOSIS — C7951 Secondary malignant neoplasm of bone: Secondary | ICD-10-CM | POA: Insufficient documentation

## 2017-06-05 DIAGNOSIS — Z8249 Family history of ischemic heart disease and other diseases of the circulatory system: Secondary | ICD-10-CM | POA: Diagnosis not present

## 2017-06-05 HISTORY — PX: IR US GUIDE VASC ACCESS RIGHT: IMG2390

## 2017-06-05 HISTORY — PX: IR FLUORO GUIDE PORT INSERTION RIGHT: IMG5741

## 2017-06-05 LAB — BASIC METABOLIC PANEL
Anion gap: 9 (ref 5–15)
BUN: 11 mg/dL (ref 6–20)
CO2: 22 mmol/L (ref 22–32)
CREATININE: 0.68 mg/dL (ref 0.61–1.24)
Calcium: 8.3 mg/dL — ABNORMAL LOW (ref 8.9–10.3)
Chloride: 111 mmol/L (ref 101–111)
Glucose, Bld: 119 mg/dL — ABNORMAL HIGH (ref 65–99)
POTASSIUM: 3.1 mmol/L — AB (ref 3.5–5.1)
SODIUM: 142 mmol/L (ref 135–145)

## 2017-06-05 LAB — PROTIME-INR
INR: 1
Prothrombin Time: 13.1 seconds (ref 11.4–15.2)

## 2017-06-05 LAB — CBC WITH DIFFERENTIAL/PLATELET
Basophils Absolute: 0.2 10*3/uL — ABNORMAL HIGH (ref 0.0–0.1)
Basophils Relative: 1 %
Eosinophils Absolute: 0.2 10*3/uL (ref 0.0–0.7)
Eosinophils Relative: 1 %
HCT: 35.3 % — ABNORMAL LOW (ref 39.0–52.0)
HEMOGLOBIN: 12.3 g/dL — AB (ref 13.0–17.0)
Lymphocytes Relative: 8 %
Lymphs Abs: 1.7 10*3/uL (ref 0.7–4.0)
MCH: 30.9 pg (ref 26.0–34.0)
MCHC: 34.8 g/dL (ref 30.0–36.0)
MCV: 88.7 fL (ref 78.0–100.0)
MONO ABS: 0.2 10*3/uL (ref 0.1–1.0)
Monocytes Relative: 1 %
NEUTROS PCT: 89 %
Neutro Abs: 18.8 10*3/uL — ABNORMAL HIGH (ref 1.7–7.7)
Platelets: 145 10*3/uL — ABNORMAL LOW (ref 150–400)
RBC: 3.98 MIL/uL — ABNORMAL LOW (ref 4.22–5.81)
RDW: 15.8 % — ABNORMAL HIGH (ref 11.5–15.5)
WBC: 21.1 10*3/uL — AB (ref 4.0–10.5)

## 2017-06-05 MED ORDER — MIDAZOLAM HCL 2 MG/2ML IJ SOLN
INTRAMUSCULAR | Status: AC | PRN
  Administered 2017-06-05 (×2): 1 mg via INTRAVENOUS

## 2017-06-05 MED ORDER — HEPARIN SOD (PORK) LOCK FLUSH 100 UNIT/ML IV SOLN
INTRAVENOUS | Status: AC | PRN
  Administered 2017-06-05: 500 [IU] via INTRAVENOUS

## 2017-06-05 MED ORDER — LIDOCAINE-EPINEPHRINE (PF) 2 %-1:200000 IJ SOLN
INTRAMUSCULAR | Status: AC | PRN
  Administered 2017-06-05: 10 mL

## 2017-06-05 MED ORDER — HEPARIN SOD (PORK) LOCK FLUSH 100 UNIT/ML IV SOLN
INTRAVENOUS | Status: AC
  Filled 2017-06-05: qty 5

## 2017-06-05 MED ORDER — FENTANYL CITRATE (PF) 100 MCG/2ML IJ SOLN
INTRAMUSCULAR | Status: AC
  Filled 2017-06-05: qty 4

## 2017-06-05 MED ORDER — CEFAZOLIN SODIUM-DEXTROSE 2-4 GM/100ML-% IV SOLN
2.0000 g | INTRAVENOUS | Status: AC
  Administered 2017-06-05: 2 g via INTRAVENOUS

## 2017-06-05 MED ORDER — LIDOCAINE HCL 1 % IJ SOLN
INTRAMUSCULAR | Status: AC
  Filled 2017-06-05: qty 20

## 2017-06-05 MED ORDER — FENTANYL CITRATE (PF) 100 MCG/2ML IJ SOLN
INTRAMUSCULAR | Status: AC | PRN
  Administered 2017-06-05 (×2): 50 ug via INTRAVENOUS

## 2017-06-05 MED ORDER — SODIUM CHLORIDE 0.9 % IV SOLN
INTRAVENOUS | Status: DC
  Administered 2017-06-05: 10:00:00 via INTRAVENOUS

## 2017-06-05 MED ORDER — MIDAZOLAM HCL 2 MG/2ML IJ SOLN
INTRAMUSCULAR | Status: AC
  Filled 2017-06-05: qty 4

## 2017-06-05 MED ORDER — CEFAZOLIN SODIUM-DEXTROSE 2-4 GM/100ML-% IV SOLN
INTRAVENOUS | Status: AC
  Administered 2017-06-05: 2 g via INTRAVENOUS
  Filled 2017-06-05: qty 100

## 2017-06-05 MED ORDER — LIDOCAINE-EPINEPHRINE (PF) 2 %-1:200000 IJ SOLN
INTRAMUSCULAR | Status: AC
  Filled 2017-06-05: qty 20

## 2017-06-05 NOTE — Discharge Instructions (Signed)
Implanted Port Home Guide °An implanted port is a type of central line that is placed under the skin. Central lines are used to provide IV access when treatment or nutrition needs to be given through a person’s veins. Implanted ports are used for long-term IV access. An implanted port may be placed because: °· You need IV medicine that would be irritating to the small veins in your hands or arms. °· You need long-term IV medicines, such as antibiotics. °· You need IV nutrition for a long period. °· You need frequent blood draws for lab tests. °· You need dialysis. ° °Implanted ports are usually placed in the chest area, but they can also be placed in the upper arm, the abdomen, or the leg. An implanted port has two main parts: °· Reservoir. The reservoir is round and will appear as a small, raised area under your skin. The reservoir is the part where a needle is inserted to give medicines or draw blood. °· Catheter. The catheter is a thin, flexible tube that extends from the reservoir. The catheter is placed into a large vein. Medicine that is inserted into the reservoir goes into the catheter and then into the vein. ° °How will I care for my incision site? °Do not get the incision site wet. Bathe or shower as directed by your health care provider. °How is my port accessed? °Special steps must be taken to access the port: °· Before the port is accessed, a numbing cream can be placed on the skin. This helps numb the skin over the port site. °· Your health care provider uses a sterile technique to access the port. °? Your health care provider must put on a mask and sterile gloves. °? The skin over your port is cleaned carefully with an antiseptic and allowed to dry. °? The port is gently pinched between sterile gloves, and a needle is inserted into the port. °· Only "non-coring" port needles should be used to access the port. Once the port is accessed, a blood return should be checked. This helps ensure that the port  is in the vein and is not clogged. °· If your port needs to remain accessed for a constant infusion, a clear (transparent) bandage will be placed over the needle site. The bandage and needle will need to be changed every week, or as directed by your health care provider. °· Keep the bandage covering the needle clean and dry. Do not get it wet. Follow your health care provider’s instructions on how to take a shower or bath while the port is accessed. °· If your port does not need to stay accessed, no bandage is needed over the port. ° °What is flushing? °Flushing helps keep the port from getting clogged. Follow your health care provider’s instructions on how and when to flush the port. Ports are usually flushed with saline solution or a medicine called heparin. The need for flushing will depend on how the port is used. °· If the port is used for intermittent medicines or blood draws, the port will need to be flushed: °? After medicines have been given. °? After blood has been drawn. °? As part of routine maintenance. °· If a constant infusion is running, the port may not need to be flushed. ° °How long will my port stay implanted? °The port can stay in for as long as your health care provider thinks it is needed. When it is time for the port to come out, surgery will be   done to remove it. The procedure is similar to the one performed when the port was put in. °When should I seek immediate medical care? °When you have an implanted port, you should seek immediate medical care if: °· You notice a bad smell coming from the incision site. °· You have swelling, redness, or drainage at the incision site. °· You have more swelling or pain at the port site or the surrounding area. °· You have a fever that is not controlled with medicine. ° °This information is not intended to replace advice given to you by your health care provider. Make sure you discuss any questions you have with your health care provider. °Document  Released: 08/04/2005 Document Revised: 01/10/2016 Document Reviewed: 04/11/2013 °Elsevier Interactive Patient Education © 2017 [employer] ° °Implanted Port Insertion, Care After °This sheet gives you information about how to care for yourself after your procedure. Your health care provider may also give you more specific instructions. If you have problems or questions, contact your health care provider. °What can I expect after the procedure? °After your procedure, it is common to have: °· Discomfort at the port insertion site. °· Bruising on the skin over the port. This should improve over 3-4 days. ° °Follow these instructions at home: °Port care °· After your port is placed, you will get a manufacturer's information card. The card has information about your port. Keep this card with you at all times. °· Take care of the port as told by your health care provider. Ask your health care provider if you or a family member can get training for taking care of the port at home. A home health care nurse may also take care of the port. °· Make sure to remember what type of port you have. °Incision care °· Follow instructions from your health care provider about how to take care of your port insertion site. Make sure you: °? Wash your hands with soap and water before you change your bandage (dressing). If soap and water are not available, use hand sanitizer. °? Change your dressing as told by your health care provider. °? Leave stitches (sutures), skin glue, or adhesive strips in place. These skin closures may need to stay in place for 2 weeks or longer. If adhesive strip edges start to loosen and curl up, you may trim the loose edges. Do not remove adhesive strips completely unless your health care provider tells you to do that. °· Check your port insertion site every day for signs of infection. Check for: °? More redness, swelling, or pain. °? More fluid or blood. °? Warmth. °? Pus or a bad smell. °General  instructions °· Do not take baths, swim, or use a hot tub until your health care provider approves. °· Do not lift anything that is heavier than 10 lb (4.5 kg) for a week, or as told by your health care provider. °· Ask your health care provider when it is okay to: °? Return to work or school. °? Resume usual physical activities or sports. °· Do not drive for 24 hours if you were given a medicine to help you relax (sedative). °· Take over-the-counter and prescription medicines only as told by your health care provider. °· Wear a medical alert bracelet in case of an emergency. This will tell any health care providers that you have a port. °· Keep all follow-up visits as told by your health care provider. This is important. °Contact a health care provider if: °· You   cannot flush your port with saline as directed, or you cannot draw blood from the port. °· You have a fever or chills. °· You have more redness, swelling, or pain around your port insertion site. °· You have more fluid or blood coming from your port insertion site. °· Your port insertion site feels warm to the touch. °· You have pus or a bad smell coming from the port insertion site. °Get help right away if: °· You have chest pain or shortness of breath. °· You have bleeding from your port that you cannot control. °Summary °· Take care of the port as told by your health care provider. °· Change your dressing as told by your health care provider. °· Keep all follow-up visits as told by your health care provider. °This information is not intended to replace advice given to you by your health care provider. Make sure you discuss any questions you have with your health care provider. °Document Released: 05/25/2013 Document Revised: 06/25/2016 Document Reviewed: 06/25/2016 °Elsevier Interactive Patient Education © 2017 [employer]. ° ° °Moderate Conscious Sedation, Adult, Care After °These instructions provide you with information about caring for yourself  after your procedure. Your health care provider may also give you more specific instructions. Your treatment has been planned according to current medical practices, but problems sometimes occur. Call your health care provider if you have any problems or questions after your procedure. °What can I expect after the procedure? °After your procedure, it is common: °· To feel sleepy for several hours. °· To feel clumsy and have poor balance for several hours. °· To have poor judgment for several hours. °· To vomit if you eat too soon. ° °Follow these instructions at home: °For at least 24 hours after the procedure: ° °· Do not: °? Participate in activities where you could fall or become injured. °? Drive. °? Use heavy machinery. °? Drink alcohol. °? Take sleeping pills or medicines that cause drowsiness. °? Make important decisions or sign legal documents. °? Take care of children on your own. °· Rest. °Eating and drinking °· Follow the diet recommended by your health care provider. °· If you vomit: °? Drink water, juice, or soup when you can drink without vomiting. °? Make sure you have little or no nausea before eating solid foods. °General instructions °· Have a responsible adult stay with you until you are awake and alert. °· Take over-the-counter and prescription medicines only as told by your health care provider. °· If you smoke, do not smoke without supervision. °· Keep all follow-up visits as told by your health care provider. This is important. °Contact a health care provider if: °· You keep feeling nauseous or you keep vomiting. °· You feel light-headed. °· You develop a rash. °· You have a fever. °Get help right away if: °· You have trouble breathing. °This information is not intended to replace advice given to you by your health care provider. Make sure you discuss any questions you have with your health care provider. °Document Released: 05/25/2013 Document Revised: 01/07/2016 Document Reviewed:  11/24/2015 °Elsevier Interactive Patient Education © 2018 [employer]. ° °

## 2017-06-05 NOTE — Procedures (Signed)
Interventional Radiology Procedure Note  Procedure: Placement of a right IJ approach single lumen PowerPort.  Tip is positioned at the superior cavoatrial junction and catheter is ready for immediate use.  Complications: No immediate Recommendations:  - Ok to shower tomorrow - Do not submerge for 7 days - Routine line care   Signed,  Heath K. McCullough, MD   

## 2017-06-05 NOTE — Telephone Encounter (Signed)
Spoke with patient, states he is doing well after his first chemo and his port was inserted today. Encouraged him to increase hydration. Call for any issues.

## 2017-06-05 NOTE — Consult Note (Signed)
Chief Complaint: Patient was seen in consultation today for Port-A-Cath placement  Referring Physician(s): [name]  Supervising Physician: Jacqulynn Cadet  Patient Status: [place]  History of Present Illness: Alec Harris is a 71 y.o. male with history of castration resistant prostate carcinoma initially diagnosed in 2003, status post prostatectomy and previously on Zytiga and Lupron. He recently had 1 cycle of chemotherapy. He now presents with progressive lymph node and bone involvement, has poor venous access and is scheduled today for Port-A-Cath placement for palliative chemotherapy.  Past Medical History:  Diagnosis Date  . Carotid artery disease ([place])    By doppler  . Claudication ([place]) 02/06/2012   ABI of 0.5 on both legs ,Rgt ext. iliac70 to 99%,rgt SFA occlude,lft ext.iliac common fem 70 to 99% ,Lft SFA occluded,mono flow popliteal  . Hyperlipemia   . PAD (peripheral artery disease) ([place]) 01/2012   severe diffuse his iliacs,SFA, carotids  . Prostate cancer ([place])   . Rectal bleeding 07/24/11   Diverticular Bleeding  . Tobacco abuse     Past Surgical History:  Procedure Laterality Date  . CAROTID ANGIOGRAM  03/02/2012   bilateral carotid artery stenosis,95% right and 80% left with a type 2 arch  . CAROTID ANGIOGRAM N/A 03/02/2012   Procedure: CAROTID ANGIOGRAM;  Surgeon: Lorretta Harp, MD;  Location: [facility] CATH LAB;  Service: Cardiovascular;  Laterality: N/A;  . CAROTID ENDARTERECTOMY Right 04-08-12   CEA  . CAROTID STENT INSERTION Left 07/05/2013   Procedure: CAROTID STENT INSERTION;  Surgeon: Serafina Mitchell, MD;  Location: [facility] CATH LAB;  Service: Cardiovascular;  Laterality: Left;  . CERVICAL SPINE SURGERY  approx. 10 years ago   disc removed from neck  . COLONOSCOPY  07/25/2011   Procedure: COLONOSCOPY;  Surgeon: Beryle Beams;  Location: [facility] ENDOSCOPY;  Service: Endoscopy;  Laterality: N/A;  . DOPPLER ECHOCARDIOGRAPHY  03/24/2012   EF >70% LV  normal  . ENDARTERECTOMY  04/08/2012   Procedure: ENDARTERECTOMY CAROTID;  Surgeon: Serafina Mitchell, MD;  Location: [facility] OR;  Service: Vascular;  Laterality: Right;  Right carotid endarterectomy with vascuguard 1cm x 6cm bovine patch angioplasty.   [name] KNEE CARTILAGE SURGERY     surgery for a torn ligament  . Lexiscan Myoview  01/14/2012   small fixed basal to mid inferior bowel attenuation artifact. no reversible ischemia  . LOWER EXTREMITY ANGIOGRAM N/A 03/02/2012   Procedure: LOWER EXTREMITY ANGIOGRAM;  Surgeon: Lorretta Harp, MD;  Location: [facility] CATH LAB;  Service: Cardiovascular;  Laterality: N/A;  . PENILE PROSTHESIS IMPLANT    . PROSTATE SURGERY  2003   for  prostate cancer  . PV angiogram  03/02/2012   total SFAs bilaterally with 3- vessel runoff below the knee, high-grade calcified iliac diseaese    Allergies: Patient has no known allergies.  Medications: Prior to Admission medications   Medication Sig Start Date End Date Taking? Authorizing Provider  amLODipine (NORVASC) 5 MG tablet TAKE 1 TABLET (5 MG TOTAL) BY MOUTH DAILY. 12/30/16  Yes Lorretta Harp, MD  aspirin EC 81 MG tablet Take 81 mg by mouth daily.   Yes [provider]  Calcium Carb-Cholecalciferol (CALCIUM 500 +D) 500-400 MG-UNIT TABS Take 1 tablet by mouth 2 (two) times daily.   Yes [provider]  cilostazol (PLETAL) 100 MG tablet TAKE 1 TABLET (100 MG TOTAL) BY MOUTH 2 (TWO) TIMES DAILY. 02/16/17  Yes Lorretta Harp, MD  potassium chloride SA (K-DUR,KLOR-CON) 20 MEQ tablet Take 1 tablet (  20 mEq total) by mouth daily. 06/02/17  Yes [name], MD  rosuvastatin (CRESTOR) 10 MG tablet TAKE 1 TABLET (10 MG TOTAL) BY MOUTH DAILY. 08/04/16  Yes Lorretta Harp, MD  traMADol (ULTRAM) 50 MG tablet Take 1 tablet (50 mg total) by mouth every 6 (six) hours as needed. 03/04/17  Yes [name], MD  atorvastatin (LIPITOR) 20 MG tablet Take 1 tablet (20 mg total) by mouth daily. 01/07/17 04/07/17   Lorretta Harp, MD  lidocaine-prilocaine (EMLA) cream Apply 1 application topically as needed. 05/21/17   [name], MD  methocarbamol (ROBAXIN) 500 MG tablet Take 1 tablet (500 mg total) by mouth 2 (two) times daily. 04/29/17   Vanessa Kick, MD  prochlorperazine (COMPAZINE) 10 MG tablet Take 1 tablet (10 mg total) by mouth every 6 (six) hours as needed for nausea or vomiting. 05/21/17   [name], MD     Family History  Problem Relation Age of Onset  . Hypertension Mother   . Heart attack Mother   . Heart disease Mother        CHF/  After age 79  . Heart attack Father   . Heart disease Father        Irregular Heart beat, Heart Disease before age 37  . Cancer Brother        throat    Social History   Social History  . Marital status: Married    Spouse name: N/A  . Number of children: N/A  . Years of education: N/A   Social History Main Topics  . Smoking status: Light Tobacco Smoker    Years: 50.00    Types: Cigarettes  . Smokeless tobacco: Never Used     Comment: pt states that he is going to quit completely; 1/2 pack per day  . Alcohol use 3.0 oz/week    3 Cans of beer, 2 Shots of liquor per week  . Drug use: No  . Sexual activity: No     Comment: smokes 5 cigarettes per day....   Other Topics Concern  . None   Social History Narrative  . None      Review of Systems currently denies fever, headache, chest pain, dyspnea, cough, abdominal/back pain, nausea, vomiting or bleeding.  Vital Signs: BP (!) 155/63 (BP Location: Left Arm)   Pulse 91   Temp 98.1 F (36.7 C) (Oral)   Resp 18   SpO2 100%   Physical Exam awake, alert.Chest clear to auscultation bilaterally. Heart with normal rate, irregular rhythm; abdomen soft, positive bowel sounds, nontender ;no lower extremity edema.  Imaging: Nm Bone Scan Whole Body  Result Date: 05/18/2017 CLINICAL DATA:  History prostate malignancy. EXAM: NUCLEAR MEDICINE WHOLE BODY BONE SCAN TECHNIQUE: Whole body  anterior and posterior images were obtained approximately 3 hours after intravenous injection of radiopharmaceutical. RADIOPHARMACEUTICALS:  20.5 mCi Technetium-54m MDP IV COMPARISON:  Nuclear bone scan of August 21, 2016. FINDINGS: There is adequate uptake of the radiopharmaceutical by the skeleton. There is adequate soft tissue clearance and renal activity. There is new increased uptake in the left high frontal-anterior parietal bone. There is stable intensely increased uptake within the sternum and manubrium. There foci of increased uptake in the anterior aspect of the right third or fourth rib in the left second or third rib. There is intensely increased uptake in the distal aspect of the right clavicle which is new. A focus of increased uptake in the midshaft of the right humerus has  markedly increased in size. There is a tiny focus of increased uptake in the distal aspect of the left humeral shaft which is new. There is intensely increased uptake in the body of L2 or L3 to the left of midline. There is increased uptake in the femoral neck of the left hip which is new. There is new intertrochanteric uptake in the left hip. On the right there is a large mid femoral focus of increased uptake that is markedly enlarged since the previous study. IMPRESSION: Marked progression of skeletal metastatic disease. The patient is at increased risk for acute fractures of the midshaft of the right humerus, mid shaft of the right femur, and intertrochanteric region of the left femur. Plain films of these regions would be useful. Electronically Signed   By: David  Martinique M.D.   On: 05/18/2017 16:20    Labs:  CBC:  Recent Labs  11/26/16 1307 03/04/17 1304 05/21/17 0853 06/02/17 0938  WBC 6.9 7.3 6.7 7.1  HGB 12.8* 12.6* 12.0* 12.8*  HCT 38.1* 37.5* 35.2* 38.1*  PLT 204 199 243 198    COAGS:  Recent Labs  06/05/17 1002  INR 1.00    BMP:  Recent Labs  03/04/17 1304 05/21/17 0853 06/02/17 0938  06/05/17 1002  NA 143 145 145 142  K 3.8 2.7* 3.0* 3.1*  CL  --   --   --  111  CO2 26 26 24 22   GLUCOSE 106 126 162* 119*  BUN 10.5 10.8 11.3 11  CALCIUM 8.9 9.1 9.0 8.3*  CREATININE 0.9 0.9 0.8 0.68  GFRNONAA  --   --   --  >60  GFRAA  --   --   --  >60    LIVER FUNCTION TESTS:  Recent Labs  11/26/16 1307 03/04/17 1304 05/21/17 0853 06/02/17 0938  BILITOT 0.79 0.75 0.82 0.76  AST 9 10 12 12   ALT 7 <6 7 <6  ALKPHOS 127 203* 362* 365*  PROT 6.7 6.7 6.9 6.9  ALBUMIN 3.2* 3.2* 3.1* 3.0*    TUMOR MARKERS: No results for input(s): AFPTM, CEA, CA199, CHROMGRNA in the last 8760 hours.  Assessment and Plan: 71 y.o. male with history of castration resistant prostate carcinoma initially diagnosed in 2003, status post prostatectomy and previously on Zytiga and Lupron. He recently had 1 cycle of chemotherapy. He now presents with progressive lymph node and bone involvement, has poor venous access and is scheduled today for Port-A-Cath placement for palliative chemotherapy.Risks and benefits discussed with the patient/spouse including, but not limited to bleeding, infection, pneumothorax, or fibrin sheath development and need for additional procedures.All of the patient's questions were answered, patient is agreeable to proceed.Consent signed and in chart.     Thank you for this interesting consult.  I greatly enjoyed meeting Alec Harris and look forward to participating in their care.  A copy of this report was sent to the requesting provider on this date.  Electronically Signed: D. Rowe Robert, PA-C 06/05/2017, 11:05 AM   I spent a total of  25 minutes   in face to face in clinical consultation, greater than 50% of which was counseling/coordinating care for Port-A-Cath placement

## 2017-06-24 ENCOUNTER — Ambulatory Visit (HOSPITAL_BASED_OUTPATIENT_CLINIC_OR_DEPARTMENT_OTHER): Payer: Medicare Other | Admitting: Oncology

## 2017-06-24 ENCOUNTER — Telehealth: Payer: Self-pay | Admitting: Oncology

## 2017-06-24 ENCOUNTER — Ambulatory Visit (HOSPITAL_BASED_OUTPATIENT_CLINIC_OR_DEPARTMENT_OTHER): Payer: Medicare Other

## 2017-06-24 ENCOUNTER — Other Ambulatory Visit (HOSPITAL_BASED_OUTPATIENT_CLINIC_OR_DEPARTMENT_OTHER): Payer: Medicare Other

## 2017-06-24 ENCOUNTER — Ambulatory Visit: Payer: Medicare Other

## 2017-06-24 VITALS — BP 141/60 | HR 79 | Temp 97.8°F | Resp 18 | Ht 69.0 in | Wt 177.5 lb

## 2017-06-24 DIAGNOSIS — D649 Anemia, unspecified: Secondary | ICD-10-CM

## 2017-06-24 DIAGNOSIS — I6523 Occlusion and stenosis of bilateral carotid arteries: Secondary | ICD-10-CM

## 2017-06-24 DIAGNOSIS — Z5111 Encounter for antineoplastic chemotherapy: Secondary | ICD-10-CM | POA: Diagnosis not present

## 2017-06-24 DIAGNOSIS — C7951 Secondary malignant neoplasm of bone: Secondary | ICD-10-CM

## 2017-06-24 DIAGNOSIS — C779 Secondary and unspecified malignant neoplasm of lymph node, unspecified: Secondary | ICD-10-CM

## 2017-06-24 DIAGNOSIS — Z95828 Presence of other vascular implants and grafts: Secondary | ICD-10-CM

## 2017-06-24 DIAGNOSIS — C61 Malignant neoplasm of prostate: Secondary | ICD-10-CM

## 2017-06-24 LAB — COMPREHENSIVE METABOLIC PANEL
ANION GAP: 8 meq/L (ref 3–11)
AST: 9 U/L (ref 5–34)
Albumin: 3.1 g/dL — ABNORMAL LOW (ref 3.5–5.0)
Alkaline Phosphatase: 255 U/L — ABNORMAL HIGH (ref 40–150)
BUN: 10.7 mg/dL (ref 7.0–26.0)
CHLORIDE: 109 meq/L (ref 98–109)
CO2: 21 meq/L — AB (ref 22–29)
CREATININE: 0.9 mg/dL (ref 0.7–1.3)
Calcium: 9 mg/dL (ref 8.4–10.4)
EGFR: >60 mL/min/{1.73_m2} (ref >60)
GLUCOSE: 155 mg/dL — AB (ref 70–140)
Potassium: 4.1 mEq/L (ref 3.5–5.1)
SODIUM: 137 meq/L (ref 136–145)
Total Bilirubin: 0.5 mg/dL (ref 0.20–1.20)
Total Protein: 6.9 g/dL (ref 6.4–8.3)

## 2017-06-24 LAB — CBC WITH DIFFERENTIAL/PLATELET
BASO%: 2 % (ref 0.0–2.0)
BASOS ABS: 0.1 10*3/uL (ref 0.0–0.1)
EOS ABS: 0 10*3/uL (ref 0.0–0.5)
EOS%: 0.3 % (ref 0.0–7.0)
HEMATOCRIT: 35.7 % — AB (ref 38.4–49.9)
HEMOGLOBIN: 11.9 g/dL — AB (ref 13.0–17.1)
LYMPH#: 1.8 10*3/uL (ref 0.9–3.3)
LYMPH%: 26 % (ref 14.0–49.0)
MCH: 30.8 pg (ref 27.2–33.4)
MCHC: 33.3 g/dL (ref 32.0–36.0)
MCV: 92.6 fL (ref 79.3–98.0)
MONO#: 0.8 10*3/uL (ref 0.1–0.9)
MONO%: 11.5 % (ref 0.0–14.0)
NEUT#: 4.3 10*3/uL (ref 1.5–6.5)
NEUT%: 60.2 % (ref 39.0–75.0)
Platelets: 307 10*3/uL (ref 140–400)
RBC: 3.85 10*6/uL — ABNORMAL LOW (ref 4.20–5.82)
RDW: 16.9 % — AB (ref 11.0–14.6)
WBC: 7.1 10*3/uL (ref 4.0–10.3)

## 2017-06-24 MED ORDER — DOCETAXEL CHEMO INJECTION 160 MG/16ML
75.0000 mg/m2 | Freq: Once | INTRAVENOUS | Status: AC
  Administered 2017-06-24: 150 mg via INTRAVENOUS
  Filled 2017-06-24: qty 15

## 2017-06-24 MED ORDER — HEPARIN SOD (PORK) LOCK FLUSH 100 UNIT/ML IV SOLN
500.0000 [IU] | Freq: Once | INTRAVENOUS | Status: AC | PRN
  Administered 2017-06-24: 500 [IU]
  Filled 2017-06-24: qty 5

## 2017-06-24 MED ORDER — SODIUM CHLORIDE 0.9 % IV SOLN
Freq: Once | INTRAVENOUS | Status: AC
  Administered 2017-06-24: 10:00:00 via INTRAVENOUS

## 2017-06-24 MED ORDER — DEXAMETHASONE SODIUM PHOSPHATE 10 MG/ML IJ SOLN
10.0000 mg | Freq: Once | INTRAMUSCULAR | Status: AC
  Administered 2017-06-24: 10 mg via INTRAVENOUS

## 2017-06-24 MED ORDER — SODIUM CHLORIDE 0.9% FLUSH
10.0000 mL | INTRAVENOUS | Status: DC | PRN
  Administered 2017-06-24: 10 mL
  Filled 2017-06-24: qty 10

## 2017-06-24 MED ORDER — SODIUM CHLORIDE 0.9 % IV SOLN: 10.0000 mg | Freq: Once | INTRAVENOUS | Status: DC

## 2017-06-24 MED ORDER — DEXAMETHASONE SODIUM PHOSPHATE 10 MG/ML IJ SOLN
INTRAMUSCULAR | Status: AC
  Filled 2017-06-24: qty 1

## 2017-06-24 MED ORDER — SODIUM CHLORIDE 0.9% FLUSH
10.0000 mL | INTRAVENOUS | Status: DC | PRN
  Administered 2017-06-24: 10 mL via INTRAVENOUS
  Filled 2017-06-24: qty 10

## 2017-06-24 NOTE — Progress Notes (Signed)
Ensure and boost samples and coupons given to patient. Barb neff notified.

## 2017-06-24 NOTE — Patient Instructions (Signed)
[facility] Discharge Instructions for Patients Receiving Chemotherapy  Today you received the following chemotherapy agents Docetaxol (Taxotere)  To help prevent nausea and vomiting after your treatment, we encourage you to take your nausea medication as prescribed.   If you develop nausea and vomiting that is not controlled by your nausea medication, call the clinic.   BELOW ARE SYMPTOMS THAT SHOULD BE REPORTED IMMEDIATELY:  *FEVER GREATER THAN 100.5 F  *CHILLS WITH OR WITHOUT FEVER  NAUSEA AND VOMITING THAT IS NOT CONTROLLED WITH YOUR NAUSEA MEDICATION  *UNUSUAL SHORTNESS OF BREATH  *UNUSUAL BRUISING OR BLEEDING  TENDERNESS IN MOUTH AND THROAT WITH OR WITHOUT PRESENCE OF ULCERS  *URINARY PROBLEMS  *BOWEL PROBLEMS  UNUSUAL RASH Items with * indicate a potential emergency and should be followed up as soon as possible.  Feel free to call the clinic should you have any questions or concerns. The clinic phone number is [phone].  Please show the [facility] at check-in to the Emergency Department and triage nurse.  Docetaxel injection (Taxotere) What is this medicine? DOCETAXEL (doe se TAX el) is a chemotherapy drug. It targets fast dividing cells, like cancer cells, and causes these cells to die. This medicine is used to treat many types of cancers like breast cancer, certain stomach cancers, head and neck cancer, lung cancer, and prostate cancer. This medicine may be used for other purposes; ask your health care provider or pharmacist if you have questions. COMMON BRAND NAME(S): Docefrez, Taxotere What should I tell my health care provider before I take this medicine? They need to know if you have any of these conditions: -infection (especially a virus infection such as chickenpox, cold sores, or herpes) -liver disease -low blood counts, like low white cell, platelet, or red cell counts -an unusual or allergic reaction to docetaxel, polysorbate 80,  other chemotherapy agents, other medicines, foods, dyes, or preservatives -pregnant or trying to get pregnant -breast-feeding How should I use this medicine? This drug is given as an infusion into a vein. It is administered in a hospital or clinic by a specially trained health care professional. Talk to your pediatrician regarding the use of this medicine in children. Special care may be needed. Overdosage: If you think you have taken too much of this medicine contact a poison control center or emergency room at once. NOTE: This medicine is only for you. Do not share this medicine with others. What if I miss a dose? It is important not to miss your dose. Call your doctor or health care professional if you are unable to keep an appointment. What may interact with this medicine? -cyclosporine -erythromycin -ketoconazole -medicines to increase blood counts like filgrastim, pegfilgrastim, sargramostim -vaccines Talk to your doctor or health care professional before taking any of these medicines: -acetaminophen -aspirin -ibuprofen -ketoprofen -naproxen This list may not describe all possible interactions. Give your health care provider a list of all the medicines, herbs, non-prescription drugs, or dietary supplements you use. Also tell them if you smoke, drink alcohol, or use illegal drugs. Some items may interact with your medicine. What should I watch for while using this medicine? Your condition will be monitored carefully while you are receiving this medicine. You will need important blood work done while you are taking this medicine. This drug may make you feel generally unwell. This is not uncommon, as chemotherapy can affect healthy cells as well as cancer cells. Report any side effects. Continue your course of treatment even though you feel  ill unless your doctor tells you to stop. In some cases, you may be given additional medicines to help with side effects. Follow all directions for  their use. Call your doctor or health care professional for advice if you get a fever, chills or sore throat, or other symptoms of a cold or flu. Do not treat yourself. This drug decreases your body's ability to fight infections. Try to avoid being around people who are sick. This medicine may increase your risk to bruise or bleed. Call your doctor or health care professional if you notice any unusual bleeding. This medicine may contain alcohol in the product. You may get drowsy or dizzy. Do not drive, use machinery, or do anything that needs mental alertness until you know how this medicine affects you. Do not stand or sit up quickly, especially if you are an older patient. This reduces the risk of dizzy or fainting spells. Avoid alcoholic drinks. Do not become pregnant while taking this medicine. Women should inform their doctor if they wish to become pregnant or think they might be pregnant. There is a potential for serious side effects to an unborn child. Talk to your health care professional or pharmacist for more information. Do not breast-feed an infant while taking this medicine. What side effects may I notice from receiving this medicine? Side effects that you should report to your doctor or health care professional as soon as possible: -allergic reactions like skin rash, itching or hives, swelling of the face, lips, or tongue -low blood counts - This drug may decrease the number of white blood cells, red blood cells and platelets. You may be at increased risk for infections and bleeding. -signs of infection - fever or chills, cough, sore throat, pain or difficulty passing urine -signs of decreased platelets or bleeding - bruising, pinpoint red spots on the skin, black, tarry stools, nosebleeds -signs of decreased red blood cells - unusually weak or tired, fainting spells, lightheadedness -breathing problems -fast or irregular heartbeat -low blood pressure -mouth sores -nausea and  vomiting -pain, swelling, redness or irritation at the injection site -pain, tingling, numbness in the hands or feet -swelling of the ankle, feet, hands -weight gain Side effects that usually do not require medical attention (report to your doctor or health care professional if they continue or are bothersome): -bone pain -complete hair loss including hair on your head, underarms, pubic hair, eyebrows, and eyelashes -diarrhea -excessive tearing -changes in the color of fingernails -loosening of the fingernails -nausea -muscle pain -red flush to skin -sweating -weak or tired This list may not describe all possible side effects. Call your doctor for medical advice about side effects. You may report side effects to FDA at 1-800-FDA-1088. Where should I keep my medicine? This drug is given in a hospital or clinic and will not be stored at home. NOTE: This sheet is a summary. It may not cover all possible information. If you have questions about this medicine, talk to your doctor, pharmacist, or health care provider.  2018 Elsevier/Gold Standard (2015-09-06 12:32:56)

## 2017-06-24 NOTE — Progress Notes (Signed)
Hematology and Oncology Follow Up Visit  Alec Harris [id number] 09/08/1945 71 y.o. 06/24/2017 9:38 AM Patient, No Pcp PerNo ref. provider found   Principle Diagnosis: 71 year old with Castration resistant prostate cancer. He was diagnosed in 2003 with Gleason score 4 + 4 = 8. Now he has metastatic disease with involvement of lymph nodes and bone.  Prior Therapy:   He underwent a prostatectomy with a pathology that showed prostatic adenocarcinoma. Gleason score 4+4 equals 8, with the tumor involving the margins and the pathologic staging was pT4 N1 with 1 of 2 left pelvic lymph nodes involved.  He was started on adjuvant hormone therapy with Lupron. He developed a rising PSA and bone metastasis.  He was then treated with ketoconazole and prednisone. He developed a rising PSA with a doubling time of just over 6 months. Bone scan in September 2013 showed increased uptake in the right lower lumbar spine at L4 and L5 and abnormal uptake in the mid sternum which had progressed since the prior bone scan. Zytiga 1000 mg daily started in October 2013.  Be discontinued in October 2018 after progression of disease.   Current therapy:  Taxotere chemotherapy at 75 mg per meter square started on 06/02/2017.  He is here for cycle 2 of chemotherapy. Lupron given at at [facility] urology.  Interim History: Alec Harris presents today for a follow-up visit. Since his last visit, he received the first cycle of Taxotere chemotherapy without major complications.  He reported grade 1 fatigue and grade 1 anorexia and of lost a few pounds.  He denies any nausea, vomiting or neuropathy.  He still ambulating without any difficulties and has not reported any fall or syncope.  He has bone pain has improved after 1 chemotherapy cycle.  He denies any back pain, shoulder pain or joint tenderness.  He denies any lower extremity edema.  His quality of life is reasonable at this time.   He denies any headaches blurred vision or  double vision. He denied any chest pain, palpitation or leg edema. He reports no nausea or vomiting. Denies hematuria or other evidence of bleeding.  Has not reported any cough or hemoptysis or hematemesis. Does not report any hematochezia or melena. Does not report any frequency urgency or hesitancy. Does not report any rash or lesions. Does not report any petechiae. Remainder of his review of systems was unremarkable.  Medications: I have reviewed the patient's current medications. No changes on my review today. Current Outpatient Medications  Medication Sig Dispense Refill  . amLODipine (NORVASC) 5 MG tablet TAKE 1 TABLET (5 MG TOTAL) BY MOUTH DAILY. 90 tablet 3  . aspirin EC 81 MG tablet Take 81 mg by mouth daily.    . Calcium Carb-Cholecalciferol (CALCIUM 500 +D) 500-400 MG-UNIT TABS Take 1 tablet by mouth 2 (two) times daily.    . cilostazol (PLETAL) 100 MG tablet TAKE 1 TABLET (100 MG TOTAL) BY MOUTH 2 (TWO) TIMES DAILY. 60 tablet 11  . lidocaine-prilocaine (EMLA) cream Apply 1 application topically as needed. 30 g 0  . prochlorperazine (COMPAZINE) 10 MG tablet Take 1 tablet (10 mg total) by mouth every 6 (six) hours as needed for nausea or vomiting. 30 tablet 0  . rosuvastatin (CRESTOR) 10 MG tablet TAKE 1 TABLET (10 MG TOTAL) BY MOUTH DAILY. 30 tablet 6  . traMADol (ULTRAM) 50 MG tablet Take 1 tablet (50 mg total) by mouth every 6 (six) hours as needed. 30 tablet 1  . atorvastatin (LIPITOR) 20 MG tablet  Take 1 tablet (20 mg total) by mouth daily. 90 tablet 1   No current facility-administered medications for this visit.      Allergies: No Known Allergies   Physical Exam: Blood pressure (!) 141/60, pulse 79, temperature 97.8 F (36.6 C), temperature source Oral, resp. rate 18, height 5\' 9"  (1.753 m), weight 177 lb 8 oz (80.5 kg), SpO2 96 %.  ECOG: 1 General appearance: Alert, awake gentleman without distress. Head: Normocephalic, without obvious abnormality no oral ulcers or  lesions. Lymph nodes: Cervical, supraclavicular, and axillary nodes normal. Heart: Regular rate and rhythm with a systolic murmur auscultated. Lung:chest clear, no wheezing, rales, normal symmetric air entry. Chest wall examination showed no masses palpated.  Port-A-Cath in place without erythema or induration. Abdomen: soft, non-tender, without masses or organomegaly no shifting dullness or ascites. FYB:OFBPZ edema bilateral lower extremities Neurological examination: No deficits noted.  Lab Results: Lab Results  Component Value Date   WBC 7.1 06/24/2017   HGB 11.9 (L) 06/24/2017   HCT 35.7 (L) 06/24/2017   MCV 92.6 06/24/2017   PLT 307 06/24/2017     Chemistry      Component Value Date/Time   NA 142 06/05/2017 1002   NA 145 06/02/2017 0938   K 3.1 (L) 06/05/2017 1002   K 3.0 (LL) 06/02/2017 0938   CL 111 06/05/2017 1002   CL 106 02/01/2013 1120   CO2 22 06/05/2017 1002   CO2 24 06/02/2017 0938   BUN 11 06/05/2017 1002   BUN 11.3 06/02/2017 0938   CREATININE 0.68 06/05/2017 1002   CREATININE 0.8 06/02/2017 0938      Component Value Date/Time   CALCIUM 8.3 (L) 06/05/2017 1002   CALCIUM 9.0 06/02/2017 0938   ALKPHOS 365 (H) 06/02/2017 0938   AST 12 06/02/2017 0938   ALT <6 06/02/2017 0938   BILITOT 0.76 06/02/2017 0938      IMPRESSION: Marked progression of skeletal metastatic disease. The patient is at increased risk for acute fractures of the midshaft of the right humerus, mid shaft of the right femur, and intertrochanteric region of the left femur. Plain films of these regions would be useful.  Results for Alec, Harris (MRN [id number]) as of 06/24/2017 09:26  Ref. Range 03/04/2017 13:04 05/21/2017 08:53  Prostate Specific Ag, Serum Latest Ref Range: 0.0 - 4.0 ng/mL 12.2 (H) 20.3 (H)      Impression and Plan:   71 year old gentleman with the following issues:  1. Castration resistant prostate cancer with metastatic disease to the bone.   He is status  post Fabio Asa that was started on October 2013. Therapy to be discontinued in October 2018 because of progression of disease.  His PSA in July 2018 was 12.2 and bone scan obtained on 05/18/2017 showed marked progression of disease.  He is currently on Taxotere 75 mg/m every 3 weeks.  He tolerated the first cycle without complications and ready to proceed with cycle 2.  He is already experiencing clinical benefit with his bone pain.  2. Androgen deprivation. He remains on Lupron given at [facility] urology every 4 months. I  recommend continuing this indefinitely.  3. Bone directed therapy. Continue calcium and vitamin D supplements. He would be a candidate for Xgeva after obtaining dental clearance.  4. Anemia: Hemoglobin is normal at this time with adequate white cell count and platelet count.  5. Sternal pain: Resolved at this time.  6. IV access: Port-A-Cath in place without complications.  This will continue to be used moving forward.  7. Antiemetics: Prescription for Compazine was given to the patient.  8. Neutropenia prophylaxis: Neulasta will be added to each chemotherapy cycle.  9. Followup.  In 3 weeks for his next cycle of chemotherapy.    Zola Button, MD 11/7/20189:38 AM

## 2017-06-24 NOTE — Telephone Encounter (Signed)
Scheduled appt per 11/7 los - Gave patient AVS and calender per los.  

## 2017-06-25 ENCOUNTER — Ambulatory Visit (HOSPITAL_BASED_OUTPATIENT_CLINIC_OR_DEPARTMENT_OTHER): Payer: Medicare Other

## 2017-06-25 VITALS — BP 150/64 | HR 74 | Temp 97.9°F | Resp 20

## 2017-06-25 DIAGNOSIS — C7951 Secondary malignant neoplasm of bone: Secondary | ICD-10-CM | POA: Diagnosis not present

## 2017-06-25 DIAGNOSIS — C61 Malignant neoplasm of prostate: Secondary | ICD-10-CM | POA: Diagnosis present

## 2017-06-25 LAB — PSA: Prostate Specific Ag, Serum: 32.4 ng/mL — ABNORMAL HIGH (ref 0.0–4.0)

## 2017-06-25 MED ORDER — PEGFILGRASTIM INJECTION 6 MG/0.6ML ~~LOC~~
6.0000 mg | PREFILLED_SYRINGE | Freq: Once | SUBCUTANEOUS | Status: AC
  Administered 2017-06-25: 6 mg via SUBCUTANEOUS
  Filled 2017-06-25: qty 0.6

## 2017-06-25 NOTE — Patient Instructions (Signed)
Pegfilgrastim injection What is this medicine? PEGFILGRASTIM (PEG fil gra stim) is a long-acting granulocyte colony-stimulating factor that stimulates the growth of neutrophils, a type of white blood cell important in the body's fight against infection. It is used to reduce the incidence of fever and infection in patients with certain types of cancer who are receiving chemotherapy that affects the bone marrow, and to increase survival after being exposed to high doses of radiation. This medicine may be used for other purposes; ask your health care provider or pharmacist if you have questions. COMMON BRAND NAME(S): Neulasta What should I tell my health care provider before I take this medicine? They need to know if you have any of these conditions: -kidney disease -latex allergy -ongoing radiation therapy -sickle cell disease -skin reactions to acrylic adhesives (On-Body Injector only) -an unusual or allergic reaction to pegfilgrastim, filgrastim, other medicines, foods, dyes, or preservatives -pregnant or trying to get pregnant -breast-feeding How should I use this medicine? This medicine is for injection under the skin. If you get this medicine at home, you will be taught how to prepare and give the pre-filled syringe or how to use the On-body Injector. Refer to the patient Instructions for Use for detailed instructions. Use exactly as directed. Tell your healthcare provider immediately if you suspect that the On-body Injector may not have performed as intended or if you suspect the use of the On-body Injector resulted in a missed or partial dose. It is important that you put your used needles and syringes in a special sharps container. Do not put them in a trash can. If you do not have a sharps container, call your pharmacist or healthcare provider to get one. Talk to your pediatrician regarding the use of this medicine in children. While this drug may be prescribed for selected conditions,  precautions do apply. Overdosage: If you think you have taken too much of this medicine contact a poison control center or emergency room at once. NOTE: This medicine is only for you. Do not share this medicine with others. What if I miss a dose? It is important not to miss your dose. Call your doctor or health care professional if you miss your dose. If you miss a dose due to an On-body Injector failure or leakage, a new dose should be administered as soon as possible using a single prefilled syringe for manual use. What may interact with this medicine? Interactions have not been studied. Give your health care provider a list of all the medicines, herbs, non-prescription drugs, or dietary supplements you use. Also tell them if you smoke, drink alcohol, or use illegal drugs. Some items may interact with your medicine. This list may not describe all possible interactions. Give your health care provider a list of all the medicines, herbs, non-prescription drugs, or dietary supplements you use. Also tell them if you smoke, drink alcohol, or use illegal drugs. Some items may interact with your medicine. What should I watch for while using this medicine? You may need blood work done while you are taking this medicine. If you are going to need a MRI, CT scan, or other procedure, tell your doctor that you are using this medicine (On-Body Injector only). What side effects may I notice from receiving this medicine? Side effects that you should report to your doctor or health care professional as soon as possible: -allergic reactions like skin rash, itching or hives, swelling of the face, lips, or tongue -dizziness -fever -pain, redness, or irritation at site   where injected -pinpoint red spots on the skin -red or dark-brown urine -shortness of breath or breathing problems -stomach or side pain, or pain at the shoulder -swelling -tiredness -trouble passing urine or change in the amount of urine Side  effects that usually do not require medical attention (report to your doctor or health care professional if they continue or are bothersome): -bone pain -muscle pain This list may not describe all possible side effects. Call your doctor for medical advice about side effects. You may report side effects to FDA at 1-800-FDA-1088. Where should I keep my medicine? Keep out of the reach of children. Store pre-filled syringes in a refrigerator between 2 and 8 degrees C (36 and 46 degrees F). Do not freeze. Keep in carton to protect from light. Throw away this medicine if it is left out of the refrigerator for more than 48 hours. Throw away any unused medicine after the expiration date. NOTE: This sheet is a summary. It may not cover all possible information. If you have questions about this medicine, talk to your doctor, pharmacist, or health care provider.  2018 Elsevier/Gold Standard (2016-07-31 12:58:03)  

## 2017-07-14 ENCOUNTER — Telehealth: Payer: Self-pay | Admitting: Oncology

## 2017-07-14 ENCOUNTER — Other Ambulatory Visit: Payer: Self-pay | Admitting: Cardiovascular Disease

## 2017-07-14 NOTE — Telephone Encounter (Signed)
Rx(s) sent to pharmacy electronically.  

## 2017-07-14 NOTE — Telephone Encounter (Signed)
Spoke to patient regarding upcoming appointment updates per 11/27 sch message

## 2017-07-15 ENCOUNTER — Ambulatory Visit (HOSPITAL_BASED_OUTPATIENT_CLINIC_OR_DEPARTMENT_OTHER): Payer: Medicare Other | Admitting: Oncology

## 2017-07-15 ENCOUNTER — Other Ambulatory Visit (HOSPITAL_BASED_OUTPATIENT_CLINIC_OR_DEPARTMENT_OTHER): Payer: Medicare Other

## 2017-07-15 ENCOUNTER — Ambulatory Visit (HOSPITAL_BASED_OUTPATIENT_CLINIC_OR_DEPARTMENT_OTHER): Payer: Medicare Other

## 2017-07-15 ENCOUNTER — Ambulatory Visit: Payer: Medicare Other

## 2017-07-15 ENCOUNTER — Telehealth: Payer: Self-pay | Admitting: Oncology

## 2017-07-15 VITALS — BP 122/62 | HR 76 | Temp 97.7°F | Resp 18 | Ht 69.0 in | Wt 175.5 lb

## 2017-07-15 DIAGNOSIS — C61 Malignant neoplasm of prostate: Secondary | ICD-10-CM

## 2017-07-15 DIAGNOSIS — C779 Secondary and unspecified malignant neoplasm of lymph node, unspecified: Secondary | ICD-10-CM | POA: Diagnosis not present

## 2017-07-15 DIAGNOSIS — Z5111 Encounter for antineoplastic chemotherapy: Secondary | ICD-10-CM | POA: Diagnosis not present

## 2017-07-15 DIAGNOSIS — C7951 Secondary malignant neoplasm of bone: Secondary | ICD-10-CM

## 2017-07-15 DIAGNOSIS — D649 Anemia, unspecified: Secondary | ICD-10-CM | POA: Diagnosis not present

## 2017-07-15 DIAGNOSIS — I6523 Occlusion and stenosis of bilateral carotid arteries: Secondary | ICD-10-CM | POA: Diagnosis not present

## 2017-07-15 DIAGNOSIS — Z95828 Presence of other vascular implants and grafts: Secondary | ICD-10-CM | POA: Insufficient documentation

## 2017-07-15 LAB — CBC WITH DIFFERENTIAL/PLATELET
BASO%: 1.6 % (ref 0.0–2.0)
Basophils Absolute: 0.1 10*3/uL (ref 0.0–0.1)
EOS ABS: 0 10*3/uL (ref 0.0–0.5)
EOS%: 0.5 % (ref 0.0–7.0)
HEMATOCRIT: 35.4 % — AB (ref 38.4–49.9)
HEMOGLOBIN: 11.8 g/dL — AB (ref 13.0–17.1)
LYMPH%: 21.8 % (ref 14.0–49.0)
MCH: 30.7 pg (ref 27.2–33.4)
MCHC: 33.2 g/dL (ref 32.0–36.0)
MCV: 92.5 fL (ref 79.3–98.0)
MONO#: 1.1 10*3/uL — AB (ref 0.1–0.9)
MONO%: 12 % (ref 0.0–14.0)
NEUT#: 5.7 10*3/uL (ref 1.5–6.5)
NEUT%: 64.1 % (ref 39.0–75.0)
PLATELETS: 258 10*3/uL (ref 140–400)
RBC: 3.83 10*6/uL — ABNORMAL LOW (ref 4.20–5.82)
RDW: 17.2 % — ABNORMAL HIGH (ref 11.0–14.6)
WBC: 8.9 10*3/uL (ref 4.0–10.3)
lymph#: 2 10*3/uL (ref 0.9–3.3)

## 2017-07-15 LAB — COMPREHENSIVE METABOLIC PANEL
ALBUMIN: 3.2 g/dL — AB (ref 3.5–5.0)
ALT: 8 U/L (ref 0–55)
AST: 9 U/L (ref 5–34)
Alkaline Phosphatase: 178 U/L — ABNORMAL HIGH (ref 40–150)
Anion Gap: 8 mEq/L (ref 3–11)
BUN: 11.1 mg/dL (ref 7.0–26.0)
CHLORIDE: 108 meq/L (ref 98–109)
CO2: 23 mEq/L (ref 22–29)
CREATININE: 0.9 mg/dL (ref 0.7–1.3)
Calcium: 9.1 mg/dL (ref 8.4–10.4)
EGFR: >60 mL/min/{1.73_m2} (ref >60)
GLUCOSE: 164 mg/dL — AB (ref 70–140)
POTASSIUM: 4 meq/L (ref 3.5–5.1)
SODIUM: 138 meq/L (ref 136–145)
Total Bilirubin: 0.6 mg/dL (ref 0.20–1.20)
Total Protein: 6.9 g/dL (ref 6.4–8.3)

## 2017-07-15 MED ORDER — DEXAMETHASONE SODIUM PHOSPHATE 10 MG/ML IJ SOLN
10.0000 mg | Freq: Once | INTRAMUSCULAR | Status: AC
  Administered 2017-07-15: 10 mg via INTRAVENOUS

## 2017-07-15 MED ORDER — SODIUM CHLORIDE 0.9 % IV SOLN
60.0000 mg/m2 | Freq: Once | INTRAVENOUS | Status: AC
  Administered 2017-07-15: 120 mg via INTRAVENOUS
  Filled 2017-07-15: qty 12

## 2017-07-15 MED ORDER — DEXAMETHASONE SODIUM PHOSPHATE 10 MG/ML IJ SOLN
INTRAMUSCULAR | Status: AC
  Filled 2017-07-15: qty 1

## 2017-07-15 MED ORDER — SODIUM CHLORIDE 0.9 % IV SOLN
Freq: Once | INTRAVENOUS | Status: AC
  Administered 2017-07-15: 10:00:00 via INTRAVENOUS

## 2017-07-15 MED ORDER — SODIUM CHLORIDE 0.9% FLUSH
10.0000 mL | INTRAVENOUS | Status: DC | PRN
  Administered 2017-07-15: 10 mL via INTRAVENOUS
  Filled 2017-07-15: qty 10

## 2017-07-15 MED ORDER — HEPARIN SOD (PORK) LOCK FLUSH 100 UNIT/ML IV SOLN
500.0000 [IU] | Freq: Once | INTRAVENOUS | Status: AC | PRN
  Administered 2017-07-15: 500 [IU]
  Filled 2017-07-15: qty 5

## 2017-07-15 MED ORDER — SODIUM CHLORIDE 0.9% FLUSH
10.0000 mL | INTRAVENOUS | Status: DC | PRN
  Administered 2017-07-15: 10 mL
  Filled 2017-07-15: qty 10

## 2017-07-15 NOTE — Telephone Encounter (Signed)
Gave avs and calendar for December and January 2019 °

## 2017-07-15 NOTE — Patient Instructions (Signed)
Docetaxel injection What is this medicine? DOCETAXEL (doe se TAX el) is a chemotherapy drug. It targets fast dividing cells, like cancer cells, and causes these cells to die. This medicine is used to treat many types of cancers like breast cancer, certain stomach cancers, head and neck cancer, lung cancer, and prostate cancer. This medicine may be used for other purposes; ask your health care provider or pharmacist if you have questions. COMMON BRAND NAME(S): Docefrez, Taxotere What should I tell my health care provider before I take this medicine? They need to know if you have any of these conditions: -infection (especially a virus infection such as chickenpox, cold sores, or herpes) -liver disease -low blood counts, like low white cell, platelet, or red cell counts -an unusual or allergic reaction to docetaxel, polysorbate 80, other chemotherapy agents, other medicines, foods, dyes, or preservatives -pregnant or trying to get pregnant -breast-feeding How should I use this medicine? This drug is given as an infusion into a vein. It is administered in a hospital or clinic by a specially trained health care professional. Talk to your pediatrician regarding the use of this medicine in children. Special care may be needed. Overdosage: If you think you have taken too much of this medicine contact a poison control center or emergency room at once. NOTE: This medicine is only for you. Do not share this medicine with others. What if I miss a dose? It is important not to miss your dose. Call your doctor or health care professional if you are unable to keep an appointment. What may interact with this medicine? -cyclosporine -erythromycin -ketoconazole -medicines to increase blood counts like filgrastim, pegfilgrastim, sargramostim -vaccines Talk to your doctor or health care professional before taking any of these medicines: -acetaminophen -aspirin -ibuprofen -ketoprofen -naproxen This list  may not describe all possible interactions. Give your health care provider a list of all the medicines, herbs, non-prescription drugs, or dietary supplements you use. Also tell them if you smoke, drink alcohol, or use illegal drugs. Some items may interact with your medicine. What should I watch for while using this medicine? Your condition will be monitored carefully while you are receiving this medicine. You will need important blood work done while you are taking this medicine. This drug may make you feel generally unwell. This is not uncommon, as chemotherapy can affect healthy cells as well as cancer cells. Report any side effects. Continue your course of treatment even though you feel ill unless your doctor tells you to stop. In some cases, you may be given additional medicines to help with side effects. Follow all directions for their use. Call your doctor or health care professional for advice if you get a fever, chills or sore throat, or other symptoms of a cold or flu. Do not treat yourself. This drug decreases your body's ability to fight infections. Try to avoid being around people who are sick. This medicine may increase your risk to bruise or bleed. Call your doctor or health care professional if you notice any unusual bleeding. This medicine may contain alcohol in the product. You may get drowsy or dizzy. Do not drive, use machinery, or do anything that needs mental alertness until you know how this medicine affects you. Do not stand or sit up quickly, especially if you are an older patient. This reduces the risk of dizzy or fainting spells. Avoid alcoholic drinks. Do not become pregnant while taking this medicine. Women should inform their doctor if they wish to become pregnant or   think they might be pregnant. There is a potential for serious side effects to an unborn child. Talk to your health care professional or pharmacist for more information. Do not breast-feed an infant while taking  this medicine. What side effects may I notice from receiving this medicine? Side effects that you should report to your doctor or health care professional as soon as possible: -allergic reactions like skin rash, itching or hives, swelling of the face, lips, or tongue -low blood counts - This drug may decrease the number of white blood cells, red blood cells and platelets. You may be at increased risk for infections and bleeding. -signs of infection - fever or chills, cough, sore throat, pain or difficulty passing urine -signs of decreased platelets or bleeding - bruising, pinpoint red spots on the skin, black, tarry stools, nosebleeds -signs of decreased red blood cells - unusually weak or tired, fainting spells, lightheadedness -breathing problems -fast or irregular heartbeat -low blood pressure -mouth sores -nausea and vomiting -pain, swelling, redness or irritation at the injection site -pain, tingling, numbness in the hands or feet -swelling of the ankle, feet, hands -weight gain Side effects that usually do not require medical attention (report to your doctor or health care professional if they continue or are bothersome): -bone pain -complete hair loss including hair on your head, underarms, pubic hair, eyebrows, and eyelashes -diarrhea -excessive tearing -changes in the color of fingernails -loosening of the fingernails -nausea -muscle pain -red flush to skin -sweating -weak or tired This list may not describe all possible side effects. Call your doctor for medical advice about side effects. You may report side effects to FDA at 1-800-FDA-1088. Where should I keep my medicine? This drug is given in a hospital or clinic and will not be stored at home. NOTE: This sheet is a summary. It may not cover all possible information. If you have questions about this medicine, talk to your doctor, pharmacist, or health care provider.  2018 Elsevier/Gold Standard (2015-09-06 12:32:56)  

## 2017-07-15 NOTE — Progress Notes (Signed)
Hematology and Oncology Follow Up Visit  Alec Harris [id number] [birth year] 71 y.o. 07/15/2017 8:54 AM Patient, No Pcp PerNo ref. provider found   Principle Diagnosis: 72 year old with Castration resistant prostate cancer. He was diagnosed in 2003 with Gleason score 4 + 4 = 8. Now he has metastatic disease with involvement of lymph nodes and bone.  Prior Therapy:   He underwent a prostatectomy with a pathology that showed prostatic adenocarcinoma. Gleason score 4+4 equals 8, with the tumor involving the margins and the pathologic staging was pT4 N1 with 1 of 2 left pelvic lymph nodes involved.  He was started on adjuvant hormone therapy with Lupron. He developed a rising PSA and bone metastasis.  He was then treated with ketoconazole and prednisone. He developed a rising PSA with a doubling time of just over 6 months. Bone scan in September 2013 showed increased uptake in the right lower lumbar spine at L4 and L5 and abnormal uptake in the mid sternum which had progressed since the prior bone scan. Zytiga 1000 mg daily started in October 2013.  Be discontinued in October 2018 after progression of disease.   Current therapy:  Taxotere chemotherapy at 75 mg per meter square started on 06/02/2017.  He is here for cycle 3 of chemotherapy. Lupron given at at [facility] urology.  Interim History: Alec Harris presents today for a follow-up visit. Since his last visit, he reports few complaints.  He has reported decrease in his appetite and increased fatigue compared to the last visit.  He is down 2 pounds despite using nutritional supplements.  He does not report any other complications related to chemotherapy.  He denies any nausea, vomiting or neuropathy.  He still ambulating without any difficulties and has not reported any fall or syncope.  He has bone pain continues to improve after chemotherapy.  He is no longer reporting sternal pain or hip pain.  He denies any back pain, shoulder pain or joint  tenderness.  He denies any lower extremity edema.  He denies any further decline in his performance status.   He denies any headaches blurred vision or double vision. He denied any chest pain, palpitation or leg edema. He reports no nausea or vomiting. Denies hematuria or other evidence of bleeding.  Has not reported any cough or hemoptysis or hematemesis. Does not report any hematochezia or melena. Does not report any frequency urgency or hesitancy. Does not report any rash or lesions. Does not report any petechiae. Remainder of his review of systems was unremarkable.  Medications: I have reviewed the patient's current medications. No changes on my review today. Current Outpatient Medications  Medication Sig Dispense Refill  . amLODipine (NORVASC) 5 MG tablet TAKE 1 TABLET (5 MG TOTAL) BY MOUTH DAILY. 90 tablet 3  . aspirin EC 81 MG tablet Take 81 mg by mouth daily.    [name] atorvastatin (LIPITOR) 20 MG tablet TAKE 1 TABLET BY MOUTH EVERY DAY 90 tablet 1  . Calcium Carb-Cholecalciferol (CALCIUM 500 +D) 500-400 MG-UNIT TABS Take 1 tablet by mouth 2 (two) times daily.    . cilostazol (PLETAL) 100 MG tablet TAKE 1 TABLET (100 MG TOTAL) BY MOUTH 2 (TWO) TIMES DAILY. 60 tablet 11  . lidocaine-prilocaine (EMLA) cream Apply 1 application topically as needed. 30 g 0  . prochlorperazine (COMPAZINE) 10 MG tablet Take 1 tablet (10 mg total) by mouth every 6 (six) hours as needed for nausea or vomiting. 30 tablet 0  . rosuvastatin (CRESTOR) 10 MG tablet TAKE 1  TABLET (10 MG TOTAL) BY MOUTH DAILY. 30 tablet 6  . traMADol (ULTRAM) 50 MG tablet Take 1 tablet (50 mg total) by mouth every 6 (six) hours as needed. 30 tablet 1   No current facility-administered medications for this visit.      Allergies: No Known Allergies   Physical Exam: Blood pressure 122/62, pulse 76, temperature 97.7 F (36.5 C), temperature source Oral, resp. rate 18, height 5\' 9"  (1.753 m), weight 175 lb 8 oz (79.6 kg), SpO2 100  %.  ECOG: 1 General appearance: Awake, alert gentleman appeared without distress. Head: Normocephalic, without obvious abnormality no oral thrush or ulcers. Lymph nodes: Cervical, supraclavicular, and axillary nodes normal. Heart: Regular rate and rhythm with a systolic murmur auscultated. Lung:chest clear, no wheezing, rales, normal symmetric air entry. Chest wall examination showed no masses palpated.  Port-A-Cath in place without erythema or induration. Abdomen: soft, non-tender, without masses or organomegaly no rebound or guarding. ZTI:WPYKD edema bilateral lower extremities Neurological examination: No deficits noted.  Lab Results: Lab Results  Component Value Date   WBC 8.9 07/15/2017   HGB 11.8 (L) 07/15/2017   HCT 35.4 (L) 07/15/2017   MCV 92.5 07/15/2017   PLT 258 07/15/2017     Chemistry      Component Value Date/Time   NA 137 06/24/2017 0830   K 4.1 06/24/2017 0830   CL 111 06/05/2017 1002   CL 106 02/01/2013 1120   CO2 21 (L) 06/24/2017 0830   BUN 10.7 06/24/2017 0830   CREATININE 0.9 06/24/2017 0830      Component Value Date/Time   CALCIUM 9.0 06/24/2017 0830   ALKPHOS 255 (H) 06/24/2017 0830   AST 9 06/24/2017 0830   ALT <6 06/24/2017 0830   BILITOT 0.50 06/24/2017 0830      Results for FILLMORE, BYNUM (MRN [id number]) as of 07/15/2017 08:10  Ref. Range 05/21/2017 08:53 06/24/2017 08:29  Prostate Specific Ag, Serum Latest Ref Range: 0.0 - 4.0 ng/mL 20.3 (H) 32.4 (H)       Impression and Plan:   71 year old gentleman with the following issues:  1. Castration resistant prostate cancer with metastatic disease to the bone.   He is status post Fabio Asa that was started on October 2013. Therapy to be discontinued in October 2018 because of progression of disease.  His PSA in July 2018 was 12.2 and bone scan obtained on 05/18/2017 showed marked progression of disease.  He started Taxotere 75 mg/m every 3 weeks on 06/02/2017 and received 2 cycles of  therapy.  He had excellent clinical response but experiencing more toxicities.  The plan is to proceed with cycle 3 without any delay but with reduced dose at this time at 60 mg/m square.  2. Androgen deprivation. He remains on Lupron given at [facility] urology every 4 months. I  recommend continuing this indefinitely.  3. Bone directed therapy. Continue calcium and vitamin D supplements. He would be a candidate for Xgeva after obtaining dental clearance.  4. Anemia: Hemoglobin remained stable without any intervention.  5. Sternal pain: Resolved at this time.  6. IV access: Port-A-Cath in use without complications.  7. Antiemetics: Prescription for Compazine was given to the patient.  8. Neutropenia prophylaxis: Neulasta will be added to each chemotherapy cycle.  9. Followup.  In 3 weeks for his next cycle of chemotherapy.     Zola Button, MD 11/28/20188:54 AM

## 2017-07-16 ENCOUNTER — Ambulatory Visit (HOSPITAL_BASED_OUTPATIENT_CLINIC_OR_DEPARTMENT_OTHER): Payer: Medicare Other

## 2017-07-16 VITALS — BP 130/79 | HR 79 | Temp 98.6°F | Resp 20

## 2017-07-16 DIAGNOSIS — C7951 Secondary malignant neoplasm of bone: Secondary | ICD-10-CM | POA: Diagnosis not present

## 2017-07-16 DIAGNOSIS — C61 Malignant neoplasm of prostate: Secondary | ICD-10-CM | POA: Diagnosis present

## 2017-07-16 DIAGNOSIS — Z5189 Encounter for other specified aftercare: Secondary | ICD-10-CM

## 2017-07-16 LAB — PSA: Prostate Specific Ag, Serum: 30.5 ng/mL — ABNORMAL HIGH (ref 0.0–4.0)

## 2017-07-16 MED ORDER — PEGFILGRASTIM INJECTION 6 MG/0.6ML ~~LOC~~
6.0000 mg | PREFILLED_SYRINGE | Freq: Once | SUBCUTANEOUS | Status: AC
  Administered 2017-07-16: 6 mg via SUBCUTANEOUS
  Filled 2017-07-16: qty 0.6

## 2017-07-16 NOTE — Patient Instructions (Signed)
Pegfilgrastim injection What is this medicine? PEGFILGRASTIM (PEG fil gra stim) is a long-acting granulocyte colony-stimulating factor that stimulates the growth of neutrophils, a type of white blood cell important in the body's fight against infection. It is used to reduce the incidence of fever and infection in patients with certain types of cancer who are receiving chemotherapy that affects the bone marrow, and to increase survival after being exposed to high doses of radiation. This medicine may be used for other purposes; ask your health care provider or pharmacist if you have questions. COMMON BRAND NAME(S): Neulasta What should I tell my health care provider before I take this medicine? They need to know if you have any of these conditions: -kidney disease -latex allergy -ongoing radiation therapy -sickle cell disease -skin reactions to acrylic adhesives (On-Body Injector only) -an unusual or allergic reaction to pegfilgrastim, filgrastim, other medicines, foods, dyes, or preservatives -pregnant or trying to get pregnant -breast-feeding How should I use this medicine? This medicine is for injection under the skin. If you get this medicine at home, you will be taught how to prepare and give the pre-filled syringe or how to use the On-body Injector. Refer to the patient Instructions for Use for detailed instructions. Use exactly as directed. Tell your healthcare provider immediately if you suspect that the On-body Injector may not have performed as intended or if you suspect the use of the On-body Injector resulted in a missed or partial dose. It is important that you put your used needles and syringes in a special sharps container. Do not put them in a trash can. If you do not have a sharps container, call your pharmacist or healthcare provider to get one. Talk to your pediatrician regarding the use of this medicine in children. While this drug may be prescribed for selected conditions,  precautions do apply. Overdosage: If you think you have taken too much of this medicine contact a poison control center or emergency room at once. NOTE: This medicine is only for you. Do not share this medicine with others. What if I miss a dose? It is important not to miss your dose. Call your doctor or health care professional if you miss your dose. If you miss a dose due to an On-body Injector failure or leakage, a new dose should be administered as soon as possible using a single prefilled syringe for manual use. What may interact with this medicine? Interactions have not been studied. Give your health care provider a list of all the medicines, herbs, non-prescription drugs, or dietary supplements you use. Also tell them if you smoke, drink alcohol, or use illegal drugs. Some items may interact with your medicine. This list may not describe all possible interactions. Give your health care provider a list of all the medicines, herbs, non-prescription drugs, or dietary supplements you use. Also tell them if you smoke, drink alcohol, or use illegal drugs. Some items may interact with your medicine. What should I watch for while using this medicine? You may need blood work done while you are taking this medicine. If you are going to need a MRI, CT scan, or other procedure, tell your doctor that you are using this medicine (On-Body Injector only). What side effects may I notice from receiving this medicine? Side effects that you should report to your doctor or health care professional as soon as possible: -allergic reactions like skin rash, itching or hives, swelling of the face, lips, or tongue -dizziness -fever -pain, redness, or irritation at site   where injected -pinpoint red spots on the skin -red or dark-brown urine -shortness of breath or breathing problems -stomach or side pain, or pain at the shoulder -swelling -tiredness -trouble passing urine or change in the amount of urine Side  effects that usually do not require medical attention (report to your doctor or health care professional if they continue or are bothersome): -bone pain -muscle pain This list may not describe all possible side effects. Call your doctor for medical advice about side effects. You may report side effects to FDA at 1-800-FDA-1088. Where should I keep my medicine? Keep out of the reach of children. Store pre-filled syringes in a refrigerator between 2 and 8 degrees C (36 and 46 degrees F). Do not freeze. Keep in carton to protect from light. Throw away this medicine if it is left out of the refrigerator for more than 48 hours. Throw away any unused medicine after the expiration date. NOTE: This sheet is a summary. It may not cover all possible information. If you have questions about this medicine, talk to your doctor, pharmacist, or health care provider.  2018 Elsevier/Gold Standard (2016-07-31 12:58:03)  

## 2017-07-30 ENCOUNTER — Encounter: Payer: Self-pay | Admitting: Oncology

## 2017-07-30 NOTE — Progress Notes (Signed)
Received PA expiration from [facility] for Lidocaine-Prilocaine cream.  Submitted via Cover My Meds:  Alec Harris Key: [id number] - PA Case ID: [id number] Need help? Call us at [phone]  Outcome  Approvedtoday  Your request has been approved

## 2017-08-05 ENCOUNTER — Ambulatory Visit (HOSPITAL_BASED_OUTPATIENT_CLINIC_OR_DEPARTMENT_OTHER): Payer: Medicare Other | Admitting: Oncology

## 2017-08-05 ENCOUNTER — Ambulatory Visit (HOSPITAL_BASED_OUTPATIENT_CLINIC_OR_DEPARTMENT_OTHER): Payer: Medicare Other

## 2017-08-05 ENCOUNTER — Ambulatory Visit: Payer: Medicare Other | Admitting: Nutrition

## 2017-08-05 ENCOUNTER — Other Ambulatory Visit (HOSPITAL_BASED_OUTPATIENT_CLINIC_OR_DEPARTMENT_OTHER): Payer: Medicare Other

## 2017-08-05 ENCOUNTER — Telehealth: Payer: Self-pay | Admitting: Oncology

## 2017-08-05 ENCOUNTER — Ambulatory Visit: Payer: Medicare Other

## 2017-08-05 VITALS — BP 168/61 | HR 97 | Temp 98.1°F | Resp 18 | Ht 69.0 in | Wt 178.3 lb

## 2017-08-05 DIAGNOSIS — I6523 Occlusion and stenosis of bilateral carotid arteries: Secondary | ICD-10-CM

## 2017-08-05 DIAGNOSIS — D649 Anemia, unspecified: Secondary | ICD-10-CM | POA: Diagnosis not present

## 2017-08-05 DIAGNOSIS — C61 Malignant neoplasm of prostate: Secondary | ICD-10-CM

## 2017-08-05 DIAGNOSIS — C7951 Secondary malignant neoplasm of bone: Secondary | ICD-10-CM | POA: Diagnosis not present

## 2017-08-05 DIAGNOSIS — Z95828 Presence of other vascular implants and grafts: Secondary | ICD-10-CM

## 2017-08-05 DIAGNOSIS — Z5111 Encounter for antineoplastic chemotherapy: Secondary | ICD-10-CM

## 2017-08-05 LAB — CBC WITH DIFFERENTIAL/PLATELET
BASO%: 1.2 % (ref 0.0–2.0)
Basophils Absolute: 0.1 10*3/uL (ref 0.0–0.1)
EOS%: 0.3 % (ref 0.0–7.0)
Eosinophils Absolute: 0 10*3/uL (ref 0.0–0.5)
HCT: 35.1 % — ABNORMAL LOW (ref 38.4–49.9)
HGB: 11.4 g/dL — ABNORMAL LOW (ref 13.0–17.1)
LYMPH#: 1.6 10*3/uL (ref 0.9–3.3)
LYMPH%: 14.4 % (ref 14.0–49.0)
MCH: 29.7 pg (ref 27.2–33.4)
MCHC: 32.4 g/dL (ref 32.0–36.0)
MCV: 91.4 fL (ref 79.3–98.0)
MONO#: 1.3 10*3/uL — ABNORMAL HIGH (ref 0.1–0.9)
MONO%: 11.3 % (ref 0.0–14.0)
NEUT%: 72.8 % (ref 39.0–75.0)
NEUTROS ABS: 8.1 10*3/uL — AB (ref 1.5–6.5)
Platelets: 264 10*3/uL (ref 140–400)
RBC: 3.84 10*6/uL — AB (ref 4.20–5.82)
RDW: 17.9 % — ABNORMAL HIGH (ref 11.0–14.6)
WBC: 11.2 10*3/uL — AB (ref 4.0–10.3)

## 2017-08-05 LAB — COMPREHENSIVE METABOLIC PANEL
ALBUMIN: 3.2 g/dL — AB (ref 3.5–5.0)
ALK PHOS: 146 U/L (ref 40–150)
ALT: <6 U/L (ref 0–55)
AST: 7 U/L (ref 5–34)
Anion Gap: 8 mEq/L (ref 3–11)
BILIRUBIN TOTAL: 0.46 mg/dL (ref 0.20–1.20)
BUN: 12.2 mg/dL (ref 7.0–26.0)
CALCIUM: 8.6 mg/dL (ref 8.4–10.4)
CO2: 23 mEq/L (ref 22–29)
Chloride: 106 mEq/L (ref 98–109)
Creatinine: 0.9 mg/dL (ref 0.7–1.3)
GLUCOSE: 159 mg/dL — AB (ref 70–140)
Potassium: 3.9 mEq/L (ref 3.5–5.1)
SODIUM: 137 meq/L (ref 136–145)
TOTAL PROTEIN: 6.6 g/dL (ref 6.4–8.3)

## 2017-08-05 MED ORDER — DEXAMETHASONE SODIUM PHOSPHATE 10 MG/ML IJ SOLN
INTRAMUSCULAR | Status: AC
  Filled 2017-08-05: qty 1

## 2017-08-05 MED ORDER — SODIUM CHLORIDE 0.9 % IV SOLN
Freq: Once | INTRAVENOUS | Status: AC
  Administered 2017-08-05: 11:00:00 via INTRAVENOUS

## 2017-08-05 MED ORDER — SODIUM CHLORIDE 0.9% FLUSH
10.0000 mL | INTRAVENOUS | Status: DC | PRN
  Administered 2017-08-05: 10 mL via INTRAVENOUS
  Filled 2017-08-05: qty 10

## 2017-08-05 MED ORDER — SODIUM CHLORIDE 0.9 % IV SOLN
60.0000 mg/m2 | Freq: Once | INTRAVENOUS | Status: AC
  Administered 2017-08-05: 120 mg via INTRAVENOUS
  Filled 2017-08-05: qty 12

## 2017-08-05 MED ORDER — SODIUM CHLORIDE 0.9% FLUSH
10.0000 mL | INTRAVENOUS | Status: DC | PRN
  Administered 2017-08-05: 10 mL
  Filled 2017-08-05: qty 10

## 2017-08-05 MED ORDER — HEPARIN SOD (PORK) LOCK FLUSH 100 UNIT/ML IV SOLN
500.0000 [IU] | Freq: Once | INTRAVENOUS | Status: AC | PRN
  Administered 2017-08-05: 500 [IU]
  Filled 2017-08-05: qty 5

## 2017-08-05 MED ORDER — DEXAMETHASONE SODIUM PHOSPHATE 10 MG/ML IJ SOLN
10.0000 mg | Freq: Once | INTRAMUSCULAR | Status: AC
  Administered 2017-08-05: 10 mg via INTRAVENOUS

## 2017-08-05 MED ORDER — AMOXICILLIN 500 MG PO TABS: 500.0000 mg | ORAL_TABLET | Freq: Two times a day (BID) | ORAL | 0 refills | Status: AC

## 2017-08-05 NOTE — Progress Notes (Signed)
Hematology and Oncology Follow Up Visit  Alec Harris [id number] [birth year] 71 y.o. 08/05/2017 9:18 AM Patient, No Pcp PerNo ref. provider found   Principle Diagnosis: 71 year old with Castration resistant prostate cancer. He was diagnosed in 2003 with Gleason score 4 + 4 = 8. Now he has metastatic disease with involvement of lymph nodes and bone.  Prior Therapy:   He underwent a prostatectomy with a pathology that showed prostatic adenocarcinoma. Gleason score 4+4 equals 8, with the tumor involving the margins and the pathologic staging was pT4 N1 with 1 of 2 left pelvic lymph nodes involved.  He was started on adjuvant hormone therapy with Lupron. He developed a rising PSA and bone metastasis.  He was then treated with ketoconazole and prednisone. He developed a rising PSA with a doubling time of just over 6 months. Bone scan in September 2013 showed increased uptake in the right lower lumbar spine at L4 and L5 and abnormal uptake in the mid sternum which had progressed since the prior bone scan. Zytiga 1000 mg daily started in October 2013.  Be discontinued in October 2018 after progression of disease.   Current therapy:  Taxotere chemotherapy at 75 mg per meter square started on 06/02/2017.  He is here for cycle 4 of chemotherapy. Lupron given at at [facility] urology.  Interim History: Alec Harris presents today for a follow-up visit. Since his last visit, he reports reasonable health and no issues.  He continues to tolerate chemotherapy without any new complications.  He denies any nausea, vomiting or neuropathy.  He still ambulating without any difficulties and has not reported any fall or syncope.  He has bone pain continues to improve after chemotherapy.  He is no longer reporting sternal pain or hip pain.  He denies any lower extremity edema.  He denies any further decline in his performance status.  His appetite has improved overall and have gained more weight.  He is using more  nutritional supplements which have helped with that.  He does report dental pain and slight infection although he has no fevers or chills or sweats.   He denies any headaches blurred vision or double vision. He denied any chest pain, palpitation or leg edema. He reports no nausea or vomiting. Denies hematuria or other evidence of bleeding.  Has not reported any cough or hemoptysis or hematemesis. Does not report any hematochezia or melena. Does not report any frequency urgency or hesitancy. Does not report any rash or lesions. Does not report any petechiae. Remainder of his review of systems was unremarkable.  Medications: I have reviewed the patient's current medications. No changes on my review today. Current Outpatient Medications  Medication Sig Dispense Refill  . amLODipine (NORVASC) 5 MG tablet TAKE 1 TABLET (5 MG TOTAL) BY MOUTH DAILY. 90 tablet 3  . aspirin EC 81 MG tablet Take 81 mg by mouth daily.    [name] atorvastatin (LIPITOR) 20 MG tablet TAKE 1 TABLET BY MOUTH EVERY DAY 90 tablet 1  . Calcium Carb-Cholecalciferol (CALCIUM 500 +D) 500-400 MG-UNIT TABS Take 1 tablet by mouth 2 (two) times daily.    . cilostazol (PLETAL) 100 MG tablet TAKE 1 TABLET (100 MG TOTAL) BY MOUTH 2 (TWO) TIMES DAILY. 60 tablet 11  . lidocaine-prilocaine (EMLA) cream Apply 1 application topically as needed. 30 g 0  . prochlorperazine (COMPAZINE) 10 MG tablet Take 1 tablet (10 mg total) by mouth every 6 (six) hours as needed for nausea or vomiting. 30 tablet 0  . rosuvastatin (  CRESTOR) 10 MG tablet TAKE 1 TABLET (10 MG TOTAL) BY MOUTH DAILY. 30 tablet 6  . traMADol (ULTRAM) 50 MG tablet Take 1 tablet (50 mg total) by mouth every 6 (six) hours as needed. 30 tablet 1  . amoxicillin (AMOXIL) 500 MG tablet Take 1 tablet (500 mg total) by mouth 2 (two) times daily for 7 days. 14 tablet 0   No current facility-administered medications for this visit.      Allergies: No Known Allergies   Physical Exam: Blood  pressure (!) 168/61, pulse 97, temperature 98.1 F (36.7 C), temperature source Oral, resp. rate 18, height 5\' 9"  (1.753 m), weight 178 lb 4.8 oz (80.9 kg), SpO2 100 %.  ECOG: 1 General appearance: Well appearing gentleman appeared without distress.. Head: Normocephalic, without obvious abnormality no oral ulcers or lesions. Lymph nodes: Cervical, supraclavicular, and axillary nodes normal. Heart: Regular rate and rhythm with a systolic murmur auscultated. Lung:chest clear, no wheezing, rales, normal symmetric air entry. Chest wall examination:  Port-A-Cath in place without erythema or induration. Abdomen: soft, non-tender, without masses or organomegaly no shifting dullness or ascites. VWU:[id number] edema bilateral lower extremities Neurological examination: No new deficits noted.  Ambulates without difficulties.  Lab Results: Lab Results  Component Value Date   WBC 11.2 (H) 08/05/2017   HGB 11.4 (L) 08/05/2017   HCT 35.1 (L) 08/05/2017   MCV 91.4 08/05/2017   PLT 264 08/05/2017     Chemistry      Component Value Date/Time   NA 138 07/15/2017 0811   K 4.0 07/15/2017 0811   CL 111 06/05/2017 1002   CL 106 02/01/2013 1120   CO2 23 07/15/2017 0811   BUN 11.1 07/15/2017 0811   CREATININE 0.9 07/15/2017 0811      Component Value Date/Time   CALCIUM 9.1 07/15/2017 0811   ALKPHOS 178 (H) 07/15/2017 0811   AST 9 07/15/2017 0811   ALT 8 07/15/2017 0811   BILITOT 0.60 07/15/2017 0811      Results for Alec, Harris (MRN [id number]) as of 08/05/2017 08:59  Ref. Range 06/24/2017 08:29 07/15/2017 08:11  Prostate Specific Ag, Serum Latest Ref Range: 0.0 - 4.0 ng/mL 32.4 (H) 30.5 (H)        Impression and Plan:   71 year old gentleman with the following issues:  1. Castration resistant prostate cancer with metastatic disease to the bone.   He is status post Alec Harris that was started on October 2013. Therapy to be discontinued in October 2018 because of progression of  disease.  His PSA in July 2018 was 12.2 and bone scan obtained on 05/18/2017 showed marked progression of disease.  He started Taxotere 75 mg/m every 3 weeks on 06/02/2017.   Continues to tolerate this therapy reasonably well with excellent clinical benefit.  His bone pain has improved and his PSA declining slowly.  Risks and benefits of continuing this medication was discussed today is agreeable to continue.  2. Androgen deprivation. He remains on Lupron given at [facility] urology every 4 months. I  recommend continuing this indefinitely.  3. Bone directed therapy. Continue calcium and vitamin D supplements. He would be a candidate for Xgeva after obtaining dental clearance.  4. Anemia: Hemoglobin is unchanged and should be adequate to proceed with chemotherapy.  5. Sternal pain: No longer an issue after the start of chemotherapy.  6. IV access: Port-A-Cath in use without complications.  7. Antiemetics: Prescription for Compazine was given to the patient.  8. Neutropenia prophylaxis: Neulasta will be added to each  chemotherapy cycle.  9.  Dental infection: Prescription for amoxicillin was given to the patient as well as instructed him to see a dentist although it would be preferable to avoid dental surgery if possible.  10. Followup.  In 3 weeks for his next cycle of chemotherapy.     Zola Button, MD 12/19/20189:18 AM

## 2017-08-05 NOTE — Patient Instructions (Signed)
[facility] Discharge Instructions for Patients Receiving Chemotherapy  Today you received the following chemotherapy agents: taxotere  To help prevent nausea and vomiting after your treatment, we encourage you to take your nausea medication as directed.   If you develop nausea and vomiting that is not controlled by your nausea medication, call the clinic.   BELOW ARE SYMPTOMS THAT SHOULD BE REPORTED IMMEDIATELY:  *FEVER GREATER THAN 100.5 F  *CHILLS WITH OR WITHOUT FEVER  NAUSEA AND VOMITING THAT IS NOT CONTROLLED WITH YOUR NAUSEA MEDICATION  *UNUSUAL SHORTNESS OF BREATH  *UNUSUAL BRUISING OR BLEEDING  TENDERNESS IN MOUTH AND THROAT WITH OR WITHOUT PRESENCE OF ULCERS  *URINARY PROBLEMS  *BOWEL PROBLEMS  UNUSUAL RASH Items with * indicate a potential emergency and should be followed up as soon as possible.  Feel free to call the clinic should you have any questions or concerns. The clinic phone number is [phone].  Please show the CHEMO ALERT CARD at check-in to the Emergency Department and triage nurse.   

## 2017-08-05 NOTE — Progress Notes (Signed)
Patient was identified to be at risk for malnutrition on the MST secondary to poor appetite and weight loss.  Patient is a 71 year old male diagnosed with prostate cancer in 2003 now with metastases.  His physician is Dr. [name] Blew.  Past medical history includes tobacco, PAD, hyperlipidemia, and CAD.  Medications include Lipitor, Compazine and Crestor.  Labs include glucose 159 and albumin 3.2 on December 18.  Height: 69 inches. Weight: 178.3. Usual body weight: 203 pounds in January. BMI: 26.33.  Patient reports his appetite has improved and he feels he is eating more often. He is drinking several oral nutrition supplements daily and fluctuates between ensure, boost and Carnation breakfast. Reports he has a tooth that needs to be pulled and is causing difficulty chewing. Denies other nutrition impact symptoms.  Nutrition diagnosis:  Food and nutrition related knowledge that deficit related to metastatic prostate cancer as evidenced by no prior need for nutrition related information.  Intervention: Patient educated to consume smaller more frequent meals and snacks with high-calorie, high-protein foods. Encouraged patient to try to maintain current weight. Recommended increase oral nutrition supplements as tolerated. Educated patient on soft food choices and provided fact sheets. Questions were answered.  Teach back method used.  Contact information given.  Patient also provided with coupons for oral nutrition supplements.  Monitoring, evaluation, goals: Patient will work to increase calories and protein to maintain weight.  Next visit: Patient has my contact information for questions or concerns.  **Disclaimer: This note was dictated with voice recognition software. Similar sounding words can inadvertently be transcribed and this note may contain transcription errors which may not have been corrected upon publication of note.**

## 2017-08-05 NOTE — Telephone Encounter (Signed)
Gave avs and calendar for January  °

## 2017-08-06 ENCOUNTER — Telehealth: Payer: Self-pay | Admitting: *Deleted

## 2017-08-06 ENCOUNTER — Ambulatory Visit (HOSPITAL_BASED_OUTPATIENT_CLINIC_OR_DEPARTMENT_OTHER): Payer: Medicare Other

## 2017-08-06 VITALS — BP 155/61 | HR 90 | Temp 98.1°F | Resp 18

## 2017-08-06 DIAGNOSIS — C7951 Secondary malignant neoplasm of bone: Secondary | ICD-10-CM

## 2017-08-06 DIAGNOSIS — Z5189 Encounter for other specified aftercare: Secondary | ICD-10-CM

## 2017-08-06 DIAGNOSIS — C61 Malignant neoplasm of prostate: Secondary | ICD-10-CM | POA: Diagnosis present

## 2017-08-06 LAB — PSA: PROSTATE SPECIFIC AG, SERUM: 25.6 ng/mL — AB (ref 0.0–4.0)

## 2017-08-06 MED ORDER — PEGFILGRASTIM INJECTION 6 MG/0.6ML ~~LOC~~
PREFILLED_SYRINGE | SUBCUTANEOUS | Status: AC
  Filled 2017-08-06: qty 0.6

## 2017-08-06 MED ORDER — PEGFILGRASTIM INJECTION 6 MG/0.6ML ~~LOC~~
6.0000 mg | PREFILLED_SYRINGE | Freq: Once | SUBCUTANEOUS | Status: AC
  Administered 2017-08-06: 6 mg via SUBCUTANEOUS

## 2017-08-06 NOTE — Telephone Encounter (Signed)
Spoke with patient, gave results of last PSA 

## 2017-08-06 NOTE — Telephone Encounter (Signed)
-----   Message from [name], MD sent at 08/06/2017  8:18 AM EST ----- Please let him know his PSA is down.

## 2017-08-06 NOTE — Patient Instructions (Signed)
Pegfilgrastim injection What is this medicine? PEGFILGRASTIM (PEG fil gra stim) is a long-acting granulocyte colony-stimulating factor that stimulates the growth of neutrophils, a type of white blood cell important in the body's fight against infection. It is used to reduce the incidence of fever and infection in patients with certain types of cancer who are receiving chemotherapy that affects the bone marrow, and to increase survival after being exposed to high doses of radiation. This medicine may be used for other purposes; ask your health care provider or pharmacist if you have questions. COMMON BRAND NAME(S): Neulasta What should I tell my health care provider before I take this medicine? They need to know if you have any of these conditions: -kidney disease -latex allergy -ongoing radiation therapy -sickle cell disease -skin reactions to acrylic adhesives (On-Body Injector only) -an unusual or allergic reaction to pegfilgrastim, filgrastim, other medicines, foods, dyes, or preservatives -pregnant or trying to get pregnant -breast-feeding How should I use this medicine? This medicine is for injection under the skin. If you get this medicine at home, you will be taught how to prepare and give the pre-filled syringe or how to use the On-body Injector. Refer to the patient Instructions for Use for detailed instructions. Use exactly as directed. Tell your healthcare provider immediately if you suspect that the On-body Injector may not have performed as intended or if you suspect the use of the On-body Injector resulted in a missed or partial dose. It is important that you put your used needles and syringes in a special sharps container. Do not put them in a trash can. If you do not have a sharps container, call your pharmacist or healthcare provider to get one. Talk to your pediatrician regarding the use of this medicine in children. While this drug may be prescribed for selected conditions,  precautions do apply. Overdosage: If you think you have taken too much of this medicine contact a poison control center or emergency room at once. NOTE: This medicine is only for you. Do not share this medicine with others. What if I miss a dose? It is important not to miss your dose. Call your doctor or health care professional if you miss your dose. If you miss a dose due to an On-body Injector failure or leakage, a new dose should be administered as soon as possible using a single prefilled syringe for manual use. What may interact with this medicine? Interactions have not been studied. Give your health care provider a list of all the medicines, herbs, non-prescription drugs, or dietary supplements you use. Also tell them if you smoke, drink alcohol, or use illegal drugs. Some items may interact with your medicine. This list may not describe all possible interactions. Give your health care provider a list of all the medicines, herbs, non-prescription drugs, or dietary supplements you use. Also tell them if you smoke, drink alcohol, or use illegal drugs. Some items may interact with your medicine. What should I watch for while using this medicine? You may need blood work done while you are taking this medicine. If you are going to need a MRI, CT scan, or other procedure, tell your doctor that you are using this medicine (On-Body Injector only). What side effects may I notice from receiving this medicine? Side effects that you should report to your doctor or health care professional as soon as possible: -allergic reactions like skin rash, itching or hives, swelling of the face, lips, or tongue -dizziness -fever -pain, redness, or irritation at site   where injected -pinpoint red spots on the skin -red or dark-brown urine -shortness of breath or breathing problems -stomach or side pain, or pain at the shoulder -swelling -tiredness -trouble passing urine or change in the amount of urine Side  effects that usually do not require medical attention (report to your doctor or health care professional if they continue or are bothersome): -bone pain -muscle pain This list may not describe all possible side effects. Call your doctor for medical advice about side effects. You may report side effects to FDA at 1-800-FDA-1088. Where should I keep my medicine? Keep out of the reach of children. Store pre-filled syringes in a refrigerator between 2 and 8 degrees C (36 and 46 degrees F). Do not freeze. Keep in carton to protect from light. Throw away this medicine if it is left out of the refrigerator for more than 48 hours. Throw away any unused medicine after the expiration date. NOTE: This sheet is a summary. It may not cover all possible information. If you have questions about this medicine, talk to your doctor, pharmacist, or health care provider.  2018 Elsevier/Gold Standard (2016-07-31 12:58:03)  

## 2017-08-26 ENCOUNTER — Inpatient Hospital Stay: Payer: Medicare Other

## 2017-08-26 ENCOUNTER — Telehealth: Payer: Self-pay | Admitting: Oncology

## 2017-08-26 ENCOUNTER — Inpatient Hospital Stay: Payer: Medicare Other | Attending: Oncology | Admitting: Oncology

## 2017-08-26 VITALS — BP 115/63 | HR 85 | Temp 97.7°F | Resp 18 | Ht 69.0 in | Wt 177.0 lb

## 2017-08-26 DIAGNOSIS — C61 Malignant neoplasm of prostate: Secondary | ICD-10-CM

## 2017-08-26 DIAGNOSIS — Z5111 Encounter for antineoplastic chemotherapy: Secondary | ICD-10-CM | POA: Insufficient documentation

## 2017-08-26 DIAGNOSIS — D6481 Anemia due to antineoplastic chemotherapy: Secondary | ICD-10-CM | POA: Diagnosis not present

## 2017-08-26 DIAGNOSIS — R9721 Rising PSA following treatment for malignant neoplasm of prostate: Secondary | ICD-10-CM

## 2017-08-26 DIAGNOSIS — C7951 Secondary malignant neoplasm of bone: Secondary | ICD-10-CM | POA: Diagnosis not present

## 2017-08-26 DIAGNOSIS — Z5189 Encounter for other specified aftercare: Secondary | ICD-10-CM | POA: Insufficient documentation

## 2017-08-26 DIAGNOSIS — Z79899 Other long term (current) drug therapy: Secondary | ICD-10-CM | POA: Diagnosis not present

## 2017-08-26 DIAGNOSIS — Z95828 Presence of other vascular implants and grafts: Secondary | ICD-10-CM

## 2017-08-26 LAB — COMPREHENSIVE METABOLIC PANEL
ALT: 5 U/L (ref 0–55)
ANION GAP: 6 (ref 3–11)
AST: 7 U/L (ref 5–34)
Albumin: 3.1 g/dL — ABNORMAL LOW (ref 3.5–5.0)
Alkaline Phosphatase: 128 U/L (ref 40–150)
BUN: 11 mg/dL (ref 7–26)
CHLORIDE: 109 mmol/L (ref 98–109)
CO2: 23 mmol/L (ref 22–29)
Calcium: 8.9 mg/dL (ref 8.4–10.4)
Creatinine, Ser: 0.93 mg/dL (ref 0.70–1.30)
Glucose, Bld: 120 mg/dL (ref 70–140)
POTASSIUM: 4.2 mmol/L (ref 3.5–5.1)
Sodium: 138 mmol/L (ref 136–145)
Total Bilirubin: 0.4 mg/dL (ref 0.2–1.2)
Total Protein: 6.7 g/dL (ref 6.4–8.3)

## 2017-08-26 LAB — CBC WITH DIFFERENTIAL/PLATELET
Abs Granulocyte: 4.5 10*3/uL (ref 1.5–6.5)
BASOS ABS: 0.1 10*3/uL (ref 0.0–0.1)
Basophils Relative: 1 %
EOS PCT: 1 %
Eosinophils Absolute: 0 10*3/uL (ref 0.0–0.5)
HCT: 34.7 % — ABNORMAL LOW (ref 38.4–49.9)
Hemoglobin: 11.2 g/dL — ABNORMAL LOW (ref 13.0–17.1)
LYMPHS PCT: 26 %
Lymphs Abs: 2.1 10*3/uL (ref 0.9–3.3)
MCH: 29.6 pg (ref 27.2–33.4)
MCHC: 32.3 g/dL (ref 32.0–36.0)
MCV: 91.6 fL (ref 79.3–98.0)
Monocytes Absolute: 1.3 10*3/uL — ABNORMAL HIGH (ref 0.1–0.9)
Monocytes Relative: 16 %
NEUTROS ABS: 4.5 10*3/uL (ref 1.5–6.5)
NEUTROS PCT: 56 %
PLATELETS: 278 10*3/uL (ref 140–400)
RBC: 3.79 MIL/uL — AB (ref 4.20–5.82)
RDW: 17.9 % — ABNORMAL HIGH (ref 11.0–15.6)
WBC: 7.9 10*3/uL (ref 4.0–10.3)

## 2017-08-26 MED ORDER — SODIUM CHLORIDE 0.9 % IV SOLN
Freq: Once | INTRAVENOUS | Status: AC
  Administered 2017-08-26: 11:00:00 via INTRAVENOUS

## 2017-08-26 MED ORDER — SODIUM CHLORIDE 0.9 % IV SOLN
60.0000 mg/m2 | Freq: Once | INTRAVENOUS | Status: AC
  Administered 2017-08-26: 120 mg via INTRAVENOUS
  Filled 2017-08-26: qty 12

## 2017-08-26 MED ORDER — HEPARIN SOD (PORK) LOCK FLUSH 100 UNIT/ML IV SOLN
500.0000 [IU] | Freq: Once | INTRAVENOUS | Status: AC | PRN
  Administered 2017-08-26: 500 [IU]
  Filled 2017-08-26: qty 5

## 2017-08-26 MED ORDER — DEXAMETHASONE SODIUM PHOSPHATE 10 MG/ML IJ SOLN
INTRAMUSCULAR | Status: AC
  Filled 2017-08-26: qty 1

## 2017-08-26 MED ORDER — DEXAMETHASONE SODIUM PHOSPHATE 10 MG/ML IJ SOLN
10.0000 mg | Freq: Once | INTRAMUSCULAR | Status: AC
  Administered 2017-08-26: 10 mg via INTRAVENOUS

## 2017-08-26 MED ORDER — SODIUM CHLORIDE 0.9% FLUSH
10.0000 mL | INTRAVENOUS | Status: DC | PRN
  Administered 2017-08-26: 10 mL via INTRAVENOUS
  Filled 2017-08-26: qty 10

## 2017-08-26 MED ORDER — SODIUM CHLORIDE 0.9% FLUSH
10.0000 mL | INTRAVENOUS | Status: DC | PRN
Start: 2017-08-26 — End: 2017-08-26
  Administered 2017-08-26: 10 mL
  Filled 2017-08-26: qty 10

## 2017-08-26 NOTE — Patient Instructions (Signed)
[facility] Discharge Instructions for Patients Receiving Chemotherapy  Today you received the following chemotherapy agents Taxotere To help prevent nausea and vomiting after your treatment, we encourage you to take your nausea medication as prescribed.   If you develop nausea and vomiting that is not controlled by your nausea medication, call the clinic.   BELOW ARE SYMPTOMS THAT SHOULD BE REPORTED IMMEDIATELY:  *FEVER GREATER THAN 100.5 F  *CHILLS WITH OR WITHOUT FEVER  NAUSEA AND VOMITING THAT IS NOT CONTROLLED WITH YOUR NAUSEA MEDICATION  *UNUSUAL SHORTNESS OF BREATH  *UNUSUAL BRUISING OR BLEEDING  TENDERNESS IN MOUTH AND THROAT WITH OR WITHOUT PRESENCE OF ULCERS  *URINARY PROBLEMS  *BOWEL PROBLEMS  UNUSUAL RASH Items with * indicate a potential emergency and should be followed up as soon as possible.  Feel free to call the clinic should you have any questions or concerns. The clinic phone number is [phone].  Please show the CHEMO ALERT CARD at check-in to the Emergency Department and triage nurse.   

## 2017-08-26 NOTE — Addendum Note (Signed)
Addended by: [name] on: 08/26/2017 04:28 PM   Modules accepted: Orders

## 2017-08-26 NOTE — Telephone Encounter (Signed)
Scheduled appt per 1/9 los - Gave patient aVS and calender per los.

## 2017-08-26 NOTE — Progress Notes (Addendum)
Hematology and Oncology Follow Up Visit  Alec Harris [id number] [birth year] 72 y.o. 08/26/2017 10:19 AM Patient, No Pcp PerNo ref. provider found   Principle Diagnosis: 72 year old man with castration resistant prostate cancer. He was diagnosed in 2003 with Gleason score 4 + 4 = 8. He has metastatic disease with involvement of lymph nodes and bone.  Prior Therapy:  He underwent a prostatectomy with a pathology that showed prostatic adenocarcinoma. Gleason score 4+4 equals 8, with the tumor involving the margins and the pathologic staging was pT4 N1 with 1 of 2 left pelvic lymph nodes involved.   He was started on adjuvant hormone therapy with Lupron. He developed a rising PSA and bone metastasis.   He was then treated with ketoconazole and prednisone. He developed a rising PSA with a doubling time of just over 6 months. Bone scan in September 2013 showed increased uptake in the right lower lumbar spine at L4 and L5 and abnormal uptake in the mid sternum which had progressed since the prior bone scan.  Zytiga 1000 mg daily started in October 2013.  Be discontinued in October 2018 after progression of disease.   Current therapy:  Taxotere chemotherapy at 75 mg per meter square started on 06/02/2017.  The dose was reduced to 60 mg/m starting with cycle 3.  He is here for cycle 5 of chemotherapy.  Lupron given at at [facility] urology.  Interim History: [name] presents with his wife for a follow-up. He continues to tolerate chemotherapy without major complications.  He does have grade 1 nausea that is treated with antiemetics without any complications.  He denies any vomiting or weight loss.  His appetite remains reasonable and his weight is stable.  Continues to have excellent improvement in his bone pain and is no longer taking any pain medication.  His sternal pain as well as shoulder pain have also resolved.  He reports no dysuria or hematuria.  He denies any major change in his bowel  habits.  He denies any peripheral neuropathy related to chemotherapy or infusion related complications.   He denies any headaches blurred vision or double vision.  He denies any seizures or syncope.  He denied any chest pain, palpitation or leg edema.  He denies any cough, wheezing or hemoptysis.  He reports no nausea or vomiting. Denies hematuria or other evidence of bleeding. Does not report any hematochezia or melena. Does not report any frequency urgency or hesitancy. Does not report any rash or lesions. Does not report any petechiae.  He does not report any skeletal complaints of arthralgias or myalgias or joint swelling.  He does not report any mood issues including depression or anxiety.  He does not report any skin rashes or lesions.  Remainder of his review of systems is negative.   Medications: I have reviewed the patient's current medications. No changes on my review today. Current Outpatient Medications  Medication Sig Dispense Refill  . amLODipine (NORVASC) 5 MG tablet TAKE 1 TABLET (5 MG TOTAL) BY MOUTH DAILY. 90 tablet 3  . aspirin EC 81 MG tablet Take 81 mg by mouth daily.    [name] atorvastatin (LIPITOR) 20 MG tablet TAKE 1 TABLET BY MOUTH EVERY DAY 90 tablet 1  . Calcium Carb-Cholecalciferol (CALCIUM 500 +D) 500-400 MG-UNIT TABS Take 1 tablet by mouth 2 (two) times daily.    . cilostazol (PLETAL) 100 MG tablet TAKE 1 TABLET (100 MG TOTAL) BY MOUTH 2 (TWO) TIMES DAILY. 60 tablet 11  . lidocaine-prilocaine (EMLA) cream  Apply 1 application topically as needed. 30 g 0  . prochlorperazine (COMPAZINE) 10 MG tablet Take 1 tablet (10 mg total) by mouth every 6 (six) hours as needed for nausea or vomiting. 30 tablet 0  . rosuvastatin (CRESTOR) 10 MG tablet TAKE 1 TABLET (10 MG TOTAL) BY MOUTH DAILY. 30 tablet 6  . traMADol (ULTRAM) 50 MG tablet Take 1 tablet (50 mg total) by mouth every 6 (six) hours as needed. 30 tablet 1   No current facility-administered medications for this visit.       Allergies: No Known Allergies   Physical Exam: Blood pressure 115/63, pulse 85, temperature 97.7 F (36.5 C), temperature source Oral, resp. rate 18, height 5\' 9"  (1.753 m), weight 177 lb (80.3 kg), SpO2 99 %.  ECOG: 1 General appearance: Alert, awake gentleman appeared comfortable. Head: Normocephalic, without obvious abnormality  Oral mucosa: No oral ulcers or lesions. Eyes: No scleral icterus.  Pupils are equal and round reactive to light. Lymph nodes: Cervical, supraclavicular, and axillary nodes normal. Heart: No murmurs or gallops.  Regular rate and rhythm. Lung: Rhonchi or wheezes.  Clear throughout auscultation the lung fields. Chest wall examination:  Port-A-Cath in place without erythema or induration. Abdomen: No rebound or guarding.  No shifting dullness or ascites.  Soft nontender. VFI:EPPIR edema bilateral lower extremities Neurological examination: No new deficits noted.  Ambulates without difficulties. Skin: No rashes or lesions. Psych:.  Mood and behavior.  Lab Results: Lab Results  Component Value Date   WBC 7.9 08/26/2017   HGB 11.2 (L) 08/26/2017   HCT 34.7 (L) 08/26/2017   MCV 91.6 08/26/2017   PLT 278 08/26/2017     Chemistry      Component Value Date/Time   NA 137 08/05/2017 0839   K 3.9 08/05/2017 0839   CL 111 06/05/2017 1002   CL 106 02/01/2013 1120   CO2 23 08/05/2017 0839   BUN 12.2 08/05/2017 0839   CREATININE 0.9 08/05/2017 0839      Component Value Date/Time   CALCIUM 8.6 08/05/2017 0839   ALKPHOS 146 08/05/2017 0839   AST 7 08/05/2017 0839   ALT <6 08/05/2017 0839   BILITOT 0.46 08/05/2017 0839        Results for [name], [name] (MRN [id number]) as of 08/26/2017 09:50  Ref. Range 06/24/2017 08:29 07/15/2017 08:11 08/05/2017 08:38  Prostate Specific Ag, Serum Latest Ref Range: 0.0 - 4.0 ng/mL 32.4 (H) 30.5 (H) 25.6 (H)       Impression and Plan:   72 year old gentleman with the following issues:  1. Castration  resistant prostate cancer with metastatic disease to the bone.   He is status post multiple therapies in the past outlined above.  His last treatment was with Fabio Asa that was started on October 2013. Therapy to be discontinued in October 2018 because of progression of disease.  His PSA in July 2018 was 12.2 and bone scan obtained on 05/18/2017 showed marked progression of disease.  He started Taxotere 75 mg/m every 3 weeks on 06/02/2017.  His dose was reduced to 60 mg/m2 starting with cycle 3.  Chemotherapy continues to result in clinical benefit improvement in his bone pain.  Risks and benefits of continuing chemotherapy was reviewed today and he is agreeable to continue.  Plan is to continue with the same dose and schedule.  A number of cycles of chemotherapy were discussed today and anticipate completing 10 cycles.  2. Androgen deprivation. He remains on Lupron given at [facility] urology every 4 months.  It was addressed today and I urged him to continue this indefinitely.  3. Bone directed therapy. Continue calcium and vitamin D supplements.  We will continue to await dental clearance before starting Xgeva.  Complications associated with this medication include osteonecrosis of the jaw among others.  Importance of this medication is event skeletal related events it is related to prostate cancer.  4. Anemia: Hemoglobin is 11.2 and is unchanged.  This is related to chemotherapy and malignancy and does not require intervention.  5. Sternal pain: Related to prostate cancer.  Pain appears to be controlled at this time after the start of chemotherapy.  6. IV access: Port-A-Cath remain in place without any issues or complications.  This will continue to be used.  7. Antiemetics: Compazine has been effective in controlling his nausea.  I recommended continuing the same dose for the time being.  8. Neutropenia prophylaxis: He will continue Neulasta after each cycle of chemotherapy to prevent  neutropenic sepsis.  9.  Dental infection: Resolved since last visit.  No fevers noted at this time.  10. Followup.  In 3 weeks for his next cycle of chemotherapy.   25 minutes was spent face-to-face with the patient today.  More than 50% was spent in counseling and coordination of care.    Zola Button, MD 1/9/201910:19 AM

## 2017-08-27 ENCOUNTER — Inpatient Hospital Stay: Payer: Medicare Other

## 2017-08-27 VITALS — BP 118/63 | HR 75 | Temp 98.2°F | Resp 18

## 2017-08-27 DIAGNOSIS — D6481 Anemia due to antineoplastic chemotherapy: Secondary | ICD-10-CM | POA: Diagnosis not present

## 2017-08-27 DIAGNOSIS — R9721 Rising PSA following treatment for malignant neoplasm of prostate: Secondary | ICD-10-CM | POA: Diagnosis not present

## 2017-08-27 DIAGNOSIS — C7951 Secondary malignant neoplasm of bone: Secondary | ICD-10-CM | POA: Diagnosis not present

## 2017-08-27 DIAGNOSIS — C61 Malignant neoplasm of prostate: Secondary | ICD-10-CM | POA: Diagnosis not present

## 2017-08-27 DIAGNOSIS — Z79899 Other long term (current) drug therapy: Secondary | ICD-10-CM | POA: Diagnosis not present

## 2017-08-27 DIAGNOSIS — Z5111 Encounter for antineoplastic chemotherapy: Secondary | ICD-10-CM | POA: Diagnosis not present

## 2017-08-27 LAB — PROSTATE-SPECIFIC AG, SERUM (LABCORP): Prostate Specific Ag, Serum: 15.8 ng/mL — ABNORMAL HIGH (ref 0.0–4.0)

## 2017-08-27 MED ORDER — PEGFILGRASTIM INJECTION 6 MG/0.6ML ~~LOC~~
PREFILLED_SYRINGE | SUBCUTANEOUS | Status: AC
  Filled 2017-08-27: qty 0.6

## 2017-08-27 MED ORDER — PEGFILGRASTIM INJECTION 6 MG/0.6ML ~~LOC~~
6.0000 mg | PREFILLED_SYRINGE | Freq: Once | SUBCUTANEOUS | Status: AC
  Administered 2017-08-27: 6 mg via SUBCUTANEOUS

## 2017-08-27 NOTE — Patient Instructions (Signed)
Pegfilgrastim injection What is this medicine? PEGFILGRASTIM (PEG fil gra stim) is a long-acting granulocyte colony-stimulating factor that stimulates the growth of neutrophils, a type of white blood cell important in the body's fight against infection. It is used to reduce the incidence of fever and infection in patients with certain types of cancer who are receiving chemotherapy that affects the bone marrow, and to increase survival after being exposed to high doses of radiation. This medicine may be used for other purposes; ask your health care provider or pharmacist if you have questions. COMMON BRAND NAME(S): Neulasta What should I tell my health care provider before I take this medicine? They need to know if you have any of these conditions: -kidney disease -latex allergy -ongoing radiation therapy -sickle cell disease -skin reactions to acrylic adhesives (On-Body Injector only) -an unusual or allergic reaction to pegfilgrastim, filgrastim, other medicines, foods, dyes, or preservatives -pregnant or trying to get pregnant -breast-feeding How should I use this medicine? This medicine is for injection under the skin. If you get this medicine at home, you will be taught how to prepare and give the pre-filled syringe or how to use the On-body Injector. Refer to the patient Instructions for Use for detailed instructions. Use exactly as directed. Tell your healthcare provider immediately if you suspect that the On-body Injector may not have performed as intended or if you suspect the use of the On-body Injector resulted in a missed or partial dose. It is important that you put your used needles and syringes in a special sharps container. Do not put them in a trash can. If you do not have a sharps container, call your pharmacist or healthcare provider to get one. Talk to your pediatrician regarding the use of this medicine in children. While this drug may be prescribed for selected conditions,  precautions do apply. Overdosage: If you think you have taken too much of this medicine contact a poison control center or emergency room at once. NOTE: This medicine is only for you. Do not share this medicine with others. What if I miss a dose? It is important not to miss your dose. Call your doctor or health care professional if you miss your dose. If you miss a dose due to an On-body Injector failure or leakage, a new dose should be administered as soon as possible using a single prefilled syringe for manual use. What may interact with this medicine? Interactions have not been studied. Give your health care provider a list of all the medicines, herbs, non-prescription drugs, or dietary supplements you use. Also tell them if you smoke, drink alcohol, or use illegal drugs. Some items may interact with your medicine. This list may not describe all possible interactions. Give your health care provider a list of all the medicines, herbs, non-prescription drugs, or dietary supplements you use. Also tell them if you smoke, drink alcohol, or use illegal drugs. Some items may interact with your medicine. What should I watch for while using this medicine? You may need blood work done while you are taking this medicine. If you are going to need a MRI, CT scan, or other procedure, tell your doctor that you are using this medicine (On-Body Injector only). What side effects may I notice from receiving this medicine? Side effects that you should report to your doctor or health care professional as soon as possible: -allergic reactions like skin rash, itching or hives, swelling of the face, lips, or tongue -dizziness -fever -pain, redness, or irritation at site   where injected -pinpoint red spots on the skin -red or dark-brown urine -shortness of breath or breathing problems -stomach or side pain, or pain at the shoulder -swelling -tiredness -trouble passing urine or change in the amount of urine Side  effects that usually do not require medical attention (report to your doctor or health care professional if they continue or are bothersome): -bone pain -muscle pain This list may not describe all possible side effects. Call your doctor for medical advice about side effects. You may report side effects to FDA at 1-800-FDA-1088. Where should I keep my medicine? Keep out of the reach of children. Store pre-filled syringes in a refrigerator between 2 and 8 degrees C (36 and 46 degrees F). Do not freeze. Keep in carton to protect from light. Throw away this medicine if it is left out of the refrigerator for more than 48 hours. Throw away any unused medicine after the expiration date. NOTE: This sheet is a summary. It may not cover all possible information. If you have questions about this medicine, talk to your doctor, pharmacist, or health care provider.  2018 Elsevier/Gold Standard (2016-07-31 12:58:03)  

## 2017-09-16 ENCOUNTER — Telehealth: Payer: Self-pay | Admitting: Oncology

## 2017-09-16 ENCOUNTER — Encounter: Payer: Self-pay | Admitting: Oncology

## 2017-09-16 ENCOUNTER — Inpatient Hospital Stay: Payer: Medicare Other

## 2017-09-16 ENCOUNTER — Inpatient Hospital Stay (HOSPITAL_BASED_OUTPATIENT_CLINIC_OR_DEPARTMENT_OTHER): Payer: Medicare Other | Admitting: Oncology

## 2017-09-16 VITALS — BP 114/54 | HR 73 | Temp 98.0°F | Resp 17 | Ht 69.0 in | Wt 179.9 lb

## 2017-09-16 DIAGNOSIS — C7981 Secondary malignant neoplasm of breast: Secondary | ICD-10-CM | POA: Diagnosis not present

## 2017-09-16 DIAGNOSIS — C61 Malignant neoplasm of prostate: Secondary | ICD-10-CM

## 2017-09-16 DIAGNOSIS — R9721 Rising PSA following treatment for malignant neoplasm of prostate: Secondary | ICD-10-CM | POA: Diagnosis not present

## 2017-09-16 DIAGNOSIS — Z5111 Encounter for antineoplastic chemotherapy: Secondary | ICD-10-CM

## 2017-09-16 DIAGNOSIS — C7951 Secondary malignant neoplasm of bone: Secondary | ICD-10-CM | POA: Diagnosis not present

## 2017-09-16 DIAGNOSIS — D6481 Anemia due to antineoplastic chemotherapy: Secondary | ICD-10-CM

## 2017-09-16 DIAGNOSIS — Z95828 Presence of other vascular implants and grafts: Secondary | ICD-10-CM

## 2017-09-16 DIAGNOSIS — Z79899 Other long term (current) drug therapy: Secondary | ICD-10-CM | POA: Diagnosis not present

## 2017-09-16 LAB — CBC WITH DIFFERENTIAL/PLATELET
BASOS ABS: 0.1 10*3/uL (ref 0.0–0.1)
Basophils Relative: 1 %
EOS ABS: 0 10*3/uL (ref 0.0–0.5)
Eosinophils Relative: 1 %
HCT: 34.3 % — ABNORMAL LOW (ref 38.4–49.9)
HEMOGLOBIN: 10.8 g/dL — AB (ref 13.0–17.1)
LYMPHS ABS: 2 10*3/uL (ref 0.9–3.3)
Lymphocytes Relative: 29 %
MCH: 28.8 pg (ref 27.2–33.4)
MCHC: 31.5 g/dL — ABNORMAL LOW (ref 32.0–36.0)
MCV: 91.5 fL (ref 79.3–98.0)
Monocytes Absolute: 1 10*3/uL — ABNORMAL HIGH (ref 0.1–0.9)
Monocytes Relative: 14 %
Neutro Abs: 3.8 10*3/uL (ref 1.5–6.5)
Neutrophils Relative %: 55 %
Platelets: 272 10*3/uL (ref 140–400)
RBC: 3.75 MIL/uL — AB (ref 4.20–5.82)
RDW: 18.3 % — ABNORMAL HIGH (ref 11.0–14.6)
WBC: 7 10*3/uL (ref 4.0–10.3)

## 2017-09-16 LAB — COMPREHENSIVE METABOLIC PANEL
ALT: <6 U/L (ref 0–55)
AST: 7 U/L (ref 5–34)
Albumin: 3 g/dL — ABNORMAL LOW (ref 3.5–5.0)
Alkaline Phosphatase: 110 U/L (ref 40–150)
Anion gap: 7 (ref 3–11)
BUN: 12 mg/dL (ref 7–26)
CHLORIDE: 108 mmol/L (ref 98–109)
CO2: 22 mmol/L (ref 22–29)
Calcium: 8.5 mg/dL (ref 8.4–10.4)
Creatinine, Ser: 0.85 mg/dL (ref 0.70–1.30)
Glucose, Bld: 140 mg/dL (ref 70–140)
POTASSIUM: 3.7 mmol/L (ref 3.5–5.1)
Sodium: 137 mmol/L (ref 136–145)
TOTAL PROTEIN: 6.3 g/dL — AB (ref 6.4–8.3)
Total Bilirubin: 0.3 mg/dL (ref 0.2–1.2)

## 2017-09-16 MED ORDER — HEPARIN SOD (PORK) LOCK FLUSH 100 UNIT/ML IV SOLN
500.0000 [IU] | Freq: Once | INTRAVENOUS | Status: AC | PRN
  Administered 2017-09-16: 500 [IU]
  Filled 2017-09-16: qty 5

## 2017-09-16 MED ORDER — DEXAMETHASONE SODIUM PHOSPHATE 10 MG/ML IJ SOLN
INTRAMUSCULAR | Status: AC
  Filled 2017-09-16: qty 1

## 2017-09-16 MED ORDER — SODIUM CHLORIDE 0.9 % IV SOLN
60.0000 mg/m2 | Freq: Once | INTRAVENOUS | Status: AC
  Administered 2017-09-16: 120 mg via INTRAVENOUS
  Filled 2017-09-16: qty 12

## 2017-09-16 MED ORDER — SODIUM CHLORIDE 0.9% FLUSH
10.0000 mL | INTRAVENOUS | Status: DC | PRN
  Administered 2017-09-16: 10 mL via INTRAVENOUS
  Filled 2017-09-16: qty 10

## 2017-09-16 MED ORDER — SODIUM CHLORIDE 0.9 % IV SOLN
Freq: Once | INTRAVENOUS | Status: AC
  Administered 2017-09-16: 11:00:00 via INTRAVENOUS

## 2017-09-16 MED ORDER — DEXAMETHASONE SODIUM PHOSPHATE 10 MG/ML IJ SOLN
10.0000 mg | Freq: Once | INTRAMUSCULAR | Status: AC
  Administered 2017-09-16: 10 mg via INTRAVENOUS

## 2017-09-16 MED ORDER — SODIUM CHLORIDE 0.9% FLUSH
10.0000 mL | INTRAVENOUS | Status: DC | PRN
  Administered 2017-09-16: 10 mL
  Filled 2017-09-16: qty 10

## 2017-09-16 NOTE — Telephone Encounter (Signed)
Scheduled appt per 1/30 los - Patient to get an updated schedule next visit.

## 2017-09-16 NOTE — Patient Instructions (Signed)
Cancer Center Discharge Instructions for Patients Receiving Chemotherapy  Today you received the following chemotherapy agents Taxotere To help prevent nausea and vomiting after your treatment, we encourage you to take your nausea medication as prescribed.   If you develop nausea and vomiting that is not controlled by your nausea medication, call the clinic.   BELOW ARE SYMPTOMS THAT SHOULD BE REPORTED IMMEDIATELY:  *FEVER GREATER THAN 100.5 F  *CHILLS WITH OR WITHOUT FEVER  NAUSEA AND VOMITING THAT IS NOT CONTROLLED WITH YOUR NAUSEA MEDICATION  *UNUSUAL SHORTNESS OF BREATH  *UNUSUAL BRUISING OR BLEEDING  TENDERNESS IN MOUTH AND THROAT WITH OR WITHOUT PRESENCE OF ULCERS  *URINARY PROBLEMS  *BOWEL PROBLEMS  UNUSUAL RASH Items with * indicate a potential emergency and should be followed up as soon as possible.  Feel free to call the clinic should you have any questions or concerns. The clinic phone number is [phone].  Please show the CHEMO ALERT CARD at check-in to the Emergency Department and triage nurse.   

## 2017-09-16 NOTE — Progress Notes (Signed)
[place] OFFICE PROGRESS NOTE  Patient, No Pcp Per No address on file  DIAGNOSIS: 72 year old man with castration resistant prostate cancer. He was diagnosed in 2003 with Gleason score 4 + 4 = 8. He has metastatic disease with involvement of lymph nodes and bone.  PRIOR THERAPY: He underwent a prostatectomy with a pathology that showed prostatic adenocarcinoma. Gleason score 4+4 equals 8, with the tumor involving the margins and the pathologic staging was pT4 N1 with 1 of 2 left pelvic lymph nodes involved.   He was started on adjuvant hormone therapy with Lupron. He developed a rising PSA and bone metastasis.   He was then treated with ketoconazole and prednisone. He developed a rising PSA with a doubling time of just over 6 months. Bone scan in September 2013 showed increased uptake in the right lower lumbar spine at L4 and L5 and abnormal uptake in the mid sternum which had progressed since the prior bone scan.  Zytiga 1000 mg daily started in October 2013.  Be discontinued in October 2018 after progression of disease.  CURRENT THERAPY: Taxotere chemotherapy at 75 mg per meter square started on 06/02/2017.  The dose was reduced to 60 mg/m starting with cycle 3.  He is here for cycle 6 of chemotherapy.  Lupron given at at [facility] urology.  INTERVAL HISTORY: Kimoni Bach 72 y.o. male returns for routine follow-up visit by himself.  The patient continues to tolerate his chemotherapy without major complications.  The patient denies fevers and chills.  Denies chest pain, shortness of breath, cough, hemoptysis.  He denies nausea, vomiting, constipation, diarrhea.  Reports that he has a good appetite and denies weight loss.  The patient has no reports of bone pain today.  He denies peripheral neuropathy related to his chemotherapy.  He denies any headaches blurred vision or double vision.  He denies any seizures or syncope.  He denied any palpitations and leg edema.   Denies hematuria or other evidence of bleeding. Does not report any hematochezia or melena. Does not report any frequency urgency or hesitancy. Does not report any petechiae.  He does not report any skeletal complaints of arthralgias or myalgias or joint swelling.  He does not report any mood issues including depression or anxiety.  He does not report any skin rashes or lesions.  Remainder of his review of systems is negative.   The patient is here for evaluation prior to cycle #6 of his chemotherapy.  MEDICAL HISTORY: Past Medical History:  Diagnosis Date  . Carotid artery disease ([place])    By doppler  . Claudication ([place]) 02/06/2012   ABI of 0.5 on both legs ,Rgt ext. iliac70 to 99%,rgt SFA occlude,lft ext.iliac common fem 70 to 99% ,Lft SFA occluded,mono flow popliteal  . Hyperlipemia   . PAD (peripheral artery disease) ([place]) 01/2012   severe diffuse his iliacs,SFA, carotids  . Prostate cancer ([place])   . Rectal bleeding 07/24/11   Diverticular Bleeding  . Tobacco abuse     ALLERGIES:  has No Known Allergies.  MEDICATIONS:  Current Outpatient Medications  Medication Sig Dispense Refill  . amLODipine (NORVASC) 5 MG tablet TAKE 1 TABLET (5 MG TOTAL) BY MOUTH DAILY. 90 tablet 3  . aspirin EC 81 MG tablet Take 81 mg by mouth daily.    [name] atorvastatin (LIPITOR) 20 MG tablet TAKE 1 TABLET BY MOUTH EVERY DAY 90 tablet 1  . Calcium Carb-Cholecalciferol (CALCIUM 500 +D) 500-400 MG-UNIT TABS Take 1 tablet by mouth 2 (  two) times daily.    . cilostazol (PLETAL) 100 MG tablet TAKE 1 TABLET (100 MG TOTAL) BY MOUTH 2 (TWO) TIMES DAILY. 60 tablet 11  . lidocaine-prilocaine (EMLA) cream Apply 1 application topically as needed. 30 g 0  . prochlorperazine (COMPAZINE) 10 MG tablet Take 1 tablet (10 mg total) by mouth every 6 (six) hours as needed for nausea or vomiting. 30 tablet 0  . rosuvastatin (CRESTOR) 10 MG tablet TAKE 1 TABLET (10 MG TOTAL) BY MOUTH DAILY. 30 tablet 6  . traMADol (ULTRAM) 50 MG tablet  Take 1 tablet (50 mg total) by mouth every 6 (six) hours as needed. 30 tablet 1   No current facility-administered medications for this visit.    Facility-Administered Medications Ordered in Other Visits  Medication Dose Route Frequency Provider Last Rate Last Dose  . heparin lock flush 100 unit/mL  500 Units Intracatheter Once PRN [name], MD      . sodium chloride flush (NS) 0.9 % injection 10 mL  10 mL Intracatheter PRN [name], MD        SURGICAL HISTORY:  Past Surgical History:  Procedure Laterality Date  . CAROTID ANGIOGRAM  03/02/2012   bilateral carotid artery stenosis,95% right and 80% left with a type 2 arch  . CAROTID ANGIOGRAM N/A 03/02/2012   Procedure: CAROTID ANGIOGRAM;  Surgeon: Lorretta Harp, MD;  Location: [facility] CATH LAB;  Service: Cardiovascular;  Laterality: N/A;  . CAROTID ENDARTERECTOMY Right 04-08-12   CEA  . CAROTID STENT INSERTION Left 07/05/2013   Procedure: CAROTID STENT INSERTION;  Surgeon: Serafina Mitchell, MD;  Location: [facility] CATH LAB;  Service: Cardiovascular;  Laterality: Left;  . CERVICAL SPINE SURGERY  approx. 10 years ago   disc removed from neck  . COLONOSCOPY  07/25/2011   Procedure: COLONOSCOPY;  Surgeon: Beryle Beams;  Location: [facility] ENDOSCOPY;  Service: Endoscopy;  Laterality: N/A;  . DOPPLER ECHOCARDIOGRAPHY  03/24/2012   EF >70% LV normal  . ENDARTERECTOMY  04/08/2012   Procedure: ENDARTERECTOMY CAROTID;  Surgeon: Serafina Mitchell, MD;  Location: [place] OR;  Service: Vascular;  Laterality: Right;  Right carotid endarterectomy with vascuguard 1cm x 6cm bovine patch angioplasty.   . IR FLUORO GUIDE PORT INSERTION RIGHT  06/05/2017  . IR US GUIDE VASC ACCESS RIGHT  06/05/2017  . KNEE CARTILAGE SURGERY     surgery for a torn ligament  . Lexiscan Myoview  01/14/2012   small fixed basal to mid inferior bowel attenuation artifact. no reversible ischemia  . LOWER EXTREMITY ANGIOGRAM N/A 03/02/2012   Procedure: LOWER EXTREMITY ANGIOGRAM;   Surgeon: Lorretta Harp, MD;  Location: [facility] CATH LAB;  Service: Cardiovascular;  Laterality: N/A;  . PENILE PROSTHESIS IMPLANT    . PROSTATE SURGERY  2003   for  prostate cancer  . PV angiogram  03/02/2012   total SFAs bilaterally with 3- vessel runoff below the knee, high-grade calcified iliac diseaese    REVIEW OF SYSTEMS:   Review of Systems  Constitutional: Negative for appetite change, chills, fatigue, fever and unexpected weight change.  HENT:   Negative for mouth sores, nosebleeds, sore throat and trouble swallowing.   Eyes: Negative for eye problems and icterus.  Respiratory: Negative for cough, hemoptysis, shortness of breath and wheezing.   Cardiovascular: Negative for chest pain and leg swelling.  Gastrointestinal: Negative for abdominal pain, constipation, diarrhea, nausea and vomiting.  Genitourinary: Negative for bladder incontinence, difficulty urinating, dysuria, frequency and hematuria.   Musculoskeletal: Negative for  back pain, gait problem, neck pain and neck stiffness.  Skin: Negative for itching and rash.  Neurological: Negative for dizziness, extremity weakness, gait problem, headaches, light-headedness and seizures.  Hematological: Negative for adenopathy. Does not bruise/bleed easily.  Psychiatric/Behavioral: Negative for confusion, depression and sleep disturbance. The patient is not nervous/anxious.     PHYSICAL EXAMINATION:  Blood pressure (!) 114/54, pulse 73, temperature 98 F (36.7 C), temperature source Oral, resp. rate 17, height 5\' 9"  (1.753 m), weight 179 lb 14.4 oz (81.6 kg), SpO2 100 %.  ECOG PERFORMANCE STATUS: 1 - Symptomatic but completely ambulatory  Physical Exam  Constitutional: Oriented to person, place, and time and well-developed, well-nourished, and in no distress. No distress.  HENT:  Head: Normocephalic and atraumatic.  Mouth/Throat: Oropharynx is clear and moist. No oropharyngeal exudate.  Eyes: Conjunctivae are normal. Right eye  exhibits no discharge. Left eye exhibits no discharge. No scleral icterus.  Neck: Normal range of motion. Neck supple.  Cardiovascular: Normal rate, regular rhythm, normal heart sounds and intact distal pulses.   Pulmonary/Chest: Effort normal and breath sounds normal. No respiratory distress. No wheezes. No rales.  Abdominal: Soft. Bowel sounds are normal. Exhibits no distension and no mass. There is no tenderness.  Musculoskeletal: Normal range of motion. Exhibits no edema.  Lymphadenopathy:    No cervical adenopathy.  Neurological: Alert and oriented to person, place, and time. Exhibits normal muscle tone. Gait normal. Coordination normal.  Skin: Skin is warm and dry. No rash noted. Not diaphoretic. No erythema. No pallor.  Psychiatric: Mood, memory and judgment normal.  Vitals reviewed.  LABORATORY DATA: Lab Results  Component Value Date   WBC 7.0 09/16/2017   HGB 10.8 (L) 09/16/2017   HCT 34.3 (L) 09/16/2017   MCV 91.5 09/16/2017   PLT 272 09/16/2017      Chemistry      Component Value Date/Time   NA 137 09/16/2017 0901   NA 137 08/05/2017 0839   K 3.7 09/16/2017 0901   K 3.9 08/05/2017 0839   CL 108 09/16/2017 0901   CL 106 02/01/2013 1120   CO2 22 09/16/2017 0901   CO2 23 08/05/2017 0839   BUN 12 09/16/2017 0901   BUN 12.2 08/05/2017 0839   CREATININE 0.85 09/16/2017 0901   CREATININE 0.9 08/05/2017 0839      Component Value Date/Time   CALCIUM 8.5 09/16/2017 0901   CALCIUM 8.6 08/05/2017 0839   ALKPHOS 110 09/16/2017 0901   ALKPHOS 146 08/05/2017 0839   AST 7 09/16/2017 0901   AST 7 08/05/2017 0839   ALT <6 09/16/2017 0901   ALT <6 08/05/2017 0839   BILITOT 0.3 09/16/2017 0901   BILITOT 0.46 08/05/2017 0839     Results for ZIARE, CRYDER (MRN [id number]) as of 09/16/2017 14:37  Ref. Range 05/21/2017 08:53 06/24/2017 08:29 07/15/2017 08:11 08/05/2017 08:38 08/26/2017 09:07  Prostate Specific Ag, Serum Latest Ref Range: 0.0 - 4.0 ng/mL 20.3 (H) 32.4 (H) 30.5 (H)  25.6 (H) 15.8 (H)    RADIOGRAPHIC STUDIES:  No results found.   ASSESSMENT/PLAN:  72 year old gentleman with the following issues:  1. Castration resistant prostate cancer with metastatic disease to the bone.   He is status post multiple therapies in the past outlined above.  His last treatment was with Fabio Asa that was started on October 2013. Therapy to be discontinued in October 2018 because of progression of disease.  His PSA in July 2018 was 12.2 and bone scan obtained on 05/18/2017 showed marked progression of disease.  He started Taxotere 75 mg/m every 3 weeks on 06/02/2017.  His dose was reduced to 60 mg/m2 starting with cycle 3.  Chemotherapy continues to result in clinical benefit improvement in his bone pain.  Risks and benefits of continuing chemotherapy was reviewed today and he is agreeable to continue.  Plan is to continue with the same dose and schedule.  A number of cycles of chemotherapy were discussed today and anticipate completing 10 cycles.  PSA is pending today.  His last PSA on 08/26/2017 was down to 15.8.  2. Androgen deprivation. He remains on Lupron given at [facility] urology every 4 months.  It was addressed today and I urged him to continue this indefinitely.  3. Bone directed therapy. Continue calcium and vitamin D supplements.  We will continue to await dental clearance before starting Xgeva.  Complications associated with this medication include osteonecrosis of the jaw among others.  Importance of this medication is event skeletal related events it is related to prostate cancer.  4. Anemia: Hemoglobin is  10.8 and is overall stable.  This is related to chemotherapy and malignancy and does not require intervention.  5. Sternal pain: Related to prostate cancer.  Pain appears to be controlled at this time after the start of chemotherapy.  6. IV access: Port-A-Cath remain in place without any issues or complications.  This will continue to be  used.  7. Antiemetics: Compazine has been effective in controlling his nausea.  I recommended continuing the same dose for the time being.  8. Neutropenia prophylaxis: He will continue Neulasta after each cycle of chemotherapy to prevent neutropenic sepsis.  9.  Dental infection: Resolved.  No fevers noted at this time.  10. Followup.  In 3 weeks for cycle 7 of chemotherapy.    No orders of the defined types were placed in this encounter.   Mikey Bussing, DNP, AGPCNP-BC, AOCNP 09/16/17

## 2017-09-17 ENCOUNTER — Inpatient Hospital Stay: Payer: Medicare Other

## 2017-09-17 VITALS — BP 140/58 | HR 70 | Temp 98.3°F | Resp 18

## 2017-09-17 DIAGNOSIS — Z5111 Encounter for antineoplastic chemotherapy: Secondary | ICD-10-CM | POA: Diagnosis not present

## 2017-09-17 DIAGNOSIS — R9721 Rising PSA following treatment for malignant neoplasm of prostate: Secondary | ICD-10-CM | POA: Diagnosis not present

## 2017-09-17 DIAGNOSIS — C61 Malignant neoplasm of prostate: Secondary | ICD-10-CM

## 2017-09-17 DIAGNOSIS — C7951 Secondary malignant neoplasm of bone: Secondary | ICD-10-CM | POA: Diagnosis not present

## 2017-09-17 DIAGNOSIS — Z79899 Other long term (current) drug therapy: Secondary | ICD-10-CM | POA: Diagnosis not present

## 2017-09-17 DIAGNOSIS — D6481 Anemia due to antineoplastic chemotherapy: Secondary | ICD-10-CM | POA: Diagnosis not present

## 2017-09-17 LAB — PROSTATE-SPECIFIC AG, SERUM (LABCORP): Prostate Specific Ag, Serum: 10.2 ng/mL — ABNORMAL HIGH (ref 0.0–4.0)

## 2017-09-17 MED ORDER — PEGFILGRASTIM INJECTION 6 MG/0.6ML ~~LOC~~
6.0000 mg | PREFILLED_SYRINGE | Freq: Once | SUBCUTANEOUS | Status: AC
  Administered 2017-09-17: 6 mg via SUBCUTANEOUS

## 2017-09-17 MED ORDER — PEGFILGRASTIM INJECTION 6 MG/0.6ML ~~LOC~~
PREFILLED_SYRINGE | SUBCUTANEOUS | Status: AC
  Filled 2017-09-17: qty 0.6

## 2017-09-17 NOTE — Patient Instructions (Signed)
Pegfilgrastim injection What is this medicine? PEGFILGRASTIM (PEG fil gra stim) is a long-acting granulocyte colony-stimulating factor that stimulates the growth of neutrophils, a type of white blood cell important in the body's fight against infection. It is used to reduce the incidence of fever and infection in patients with certain types of cancer who are receiving chemotherapy that affects the bone marrow, and to increase survival after being exposed to high doses of radiation. This medicine may be used for other purposes; ask your health care provider or pharmacist if you have questions. COMMON BRAND NAME(S): Neulasta What should I tell my health care provider before I take this medicine? They need to know if you have any of these conditions: -kidney disease -latex allergy -ongoing radiation therapy -sickle cell disease -skin reactions to acrylic adhesives (On-Body Injector only) -an unusual or allergic reaction to pegfilgrastim, filgrastim, other medicines, foods, dyes, or preservatives -pregnant or trying to get pregnant -breast-feeding How should I use this medicine? This medicine is for injection under the skin. If you get this medicine at home, you will be taught how to prepare and give the pre-filled syringe or how to use the On-body Injector. Refer to the patient Instructions for Use for detailed instructions. Use exactly as directed. Tell your healthcare provider immediately if you suspect that the On-body Injector may not have performed as intended or if you suspect the use of the On-body Injector resulted in a missed or partial dose. It is important that you put your used needles and syringes in a special sharps container. Do not put them in a trash can. If you do not have a sharps container, call your pharmacist or healthcare provider to get one. Talk to your pediatrician regarding the use of this medicine in children. While this drug may be prescribed for selected conditions,  precautions do apply. Overdosage: If you think you have taken too much of this medicine contact a poison control center or emergency room at once. NOTE: This medicine is only for you. Do not share this medicine with others. What if I miss a dose? It is important not to miss your dose. Call your doctor or health care professional if you miss your dose. If you miss a dose due to an On-body Injector failure or leakage, a new dose should be administered as soon as possible using a single prefilled syringe for manual use. What may interact with this medicine? Interactions have not been studied. Give your health care provider a list of all the medicines, herbs, non-prescription drugs, or dietary supplements you use. Also tell them if you smoke, drink alcohol, or use illegal drugs. Some items may interact with your medicine. This list may not describe all possible interactions. Give your health care provider a list of all the medicines, herbs, non-prescription drugs, or dietary supplements you use. Also tell them if you smoke, drink alcohol, or use illegal drugs. Some items may interact with your medicine. What should I watch for while using this medicine? You may need blood work done while you are taking this medicine. If you are going to need a MRI, CT scan, or other procedure, tell your doctor that you are using this medicine (On-Body Injector only). What side effects may I notice from receiving this medicine? Side effects that you should report to your doctor or health care professional as soon as possible: -allergic reactions like skin rash, itching or hives, swelling of the face, lips, or tongue -dizziness -fever -pain, redness, or irritation at site   where injected -pinpoint red spots on the skin -red or dark-brown urine -shortness of breath or breathing problems -stomach or side pain, or pain at the shoulder -swelling -tiredness -trouble passing urine or change in the amount of urine Side  effects that usually do not require medical attention (report to your doctor or health care professional if they continue or are bothersome): -bone pain -muscle pain This list may not describe all possible side effects. Call your doctor for medical advice about side effects. You may report side effects to FDA at 1-800-FDA-1088. Where should I keep my medicine? Keep out of the reach of children. Store pre-filled syringes in a refrigerator between 2 and 8 degrees C (36 and 46 degrees F). Do not freeze. Keep in carton to protect from light. Throw away this medicine if it is left out of the refrigerator for more than 48 hours. Throw away any unused medicine after the expiration date. NOTE: This sheet is a summary. It may not cover all possible information. If you have questions about this medicine, talk to your doctor, pharmacist, or health care provider.  2018 Elsevier/Gold Standard (2016-07-31 12:58:03)  

## 2017-10-05 ENCOUNTER — Encounter (HOSPITAL_COMMUNITY): Payer: Medicare Other

## 2017-10-05 ENCOUNTER — Ambulatory Visit: Payer: Medicare Other | Admitting: Family

## 2017-10-07 ENCOUNTER — Inpatient Hospital Stay: Payer: Medicare Other | Attending: Oncology

## 2017-10-07 ENCOUNTER — Inpatient Hospital Stay: Payer: Medicare Other

## 2017-10-07 ENCOUNTER — Telehealth: Payer: Self-pay | Admitting: Oncology

## 2017-10-07 ENCOUNTER — Inpatient Hospital Stay (HOSPITAL_BASED_OUTPATIENT_CLINIC_OR_DEPARTMENT_OTHER): Payer: Medicare Other | Admitting: Oncology

## 2017-10-07 ENCOUNTER — Encounter: Payer: Self-pay | Admitting: Oncology

## 2017-10-07 VITALS — BP 125/65 | HR 75 | Temp 97.2°F | Resp 15 | Wt 177.6 lb

## 2017-10-07 DIAGNOSIS — E785 Hyperlipidemia, unspecified: Secondary | ICD-10-CM | POA: Diagnosis not present

## 2017-10-07 DIAGNOSIS — D649 Anemia, unspecified: Secondary | ICD-10-CM | POA: Diagnosis not present

## 2017-10-07 DIAGNOSIS — Z79818 Long term (current) use of other agents affecting estrogen receptors and estrogen levels: Secondary | ICD-10-CM | POA: Insufficient documentation

## 2017-10-07 DIAGNOSIS — I2510 Atherosclerotic heart disease of native coronary artery without angina pectoris: Secondary | ICD-10-CM | POA: Diagnosis not present

## 2017-10-07 DIAGNOSIS — R9721 Rising PSA following treatment for malignant neoplasm of prostate: Secondary | ICD-10-CM

## 2017-10-07 DIAGNOSIS — Z7982 Long term (current) use of aspirin: Secondary | ICD-10-CM | POA: Insufficient documentation

## 2017-10-07 DIAGNOSIS — Z7689 Persons encountering health services in other specified circumstances: Secondary | ICD-10-CM | POA: Insufficient documentation

## 2017-10-07 DIAGNOSIS — C61 Malignant neoplasm of prostate: Secondary | ICD-10-CM

## 2017-10-07 DIAGNOSIS — C7951 Secondary malignant neoplasm of bone: Secondary | ICD-10-CM | POA: Diagnosis not present

## 2017-10-07 DIAGNOSIS — Z95828 Presence of other vascular implants and grafts: Secondary | ICD-10-CM

## 2017-10-07 DIAGNOSIS — Z5111 Encounter for antineoplastic chemotherapy: Secondary | ICD-10-CM

## 2017-10-07 DIAGNOSIS — C779 Secondary and unspecified malignant neoplasm of lymph node, unspecified: Secondary | ICD-10-CM | POA: Diagnosis not present

## 2017-10-07 DIAGNOSIS — G893 Neoplasm related pain (acute) (chronic): Secondary | ICD-10-CM | POA: Diagnosis not present

## 2017-10-07 DIAGNOSIS — Z79899 Other long term (current) drug therapy: Secondary | ICD-10-CM | POA: Insufficient documentation

## 2017-10-07 DIAGNOSIS — I739 Peripheral vascular disease, unspecified: Secondary | ICD-10-CM

## 2017-10-07 LAB — CBC WITH DIFFERENTIAL/PLATELET
BASOS PCT: 1 %
Basophils Absolute: 0 10*3/uL (ref 0.0–0.1)
EOS ABS: 0 10*3/uL (ref 0.0–0.5)
EOS PCT: 0 %
HCT: 34 % — ABNORMAL LOW (ref 38.4–49.9)
Hemoglobin: 10.8 g/dL — ABNORMAL LOW (ref 13.0–17.1)
Lymphocytes Relative: 32 %
Lymphs Abs: 2.2 10*3/uL (ref 0.9–3.3)
MCH: 29 pg (ref 27.2–33.4)
MCHC: 31.8 g/dL — AB (ref 32.0–36.0)
MCV: 91.2 fL (ref 79.3–98.0)
Monocytes Absolute: 0.8 10*3/uL (ref 0.1–0.9)
Monocytes Relative: 11 %
Neutro Abs: 3.8 10*3/uL (ref 1.5–6.5)
Neutrophils Relative %: 56 %
PLATELETS: 258 10*3/uL (ref 140–400)
RBC: 3.73 MIL/uL — AB (ref 4.20–5.82)
RDW: 18.2 % — ABNORMAL HIGH (ref 11.0–14.6)
WBC: 6.9 10*3/uL (ref 4.0–10.3)

## 2017-10-07 LAB — COMPREHENSIVE METABOLIC PANEL
ALK PHOS: 110 U/L (ref 40–150)
ALT: 7 U/L (ref 0–55)
AST: 9 U/L (ref 5–34)
Albumin: 3 g/dL — ABNORMAL LOW (ref 3.5–5.0)
Anion gap: 9 (ref 3–11)
BILIRUBIN TOTAL: 0.3 mg/dL (ref 0.2–1.2)
BUN: 12 mg/dL (ref 7–26)
CALCIUM: 8.9 mg/dL (ref 8.4–10.4)
CO2: 21 mmol/L — ABNORMAL LOW (ref 22–29)
CREATININE: 0.88 mg/dL (ref 0.70–1.30)
Chloride: 109 mmol/L (ref 98–109)
GFR calc Af Amer: >60 mL/min (ref >60)
Glucose, Bld: 133 mg/dL (ref 70–140)
Potassium: 4 mmol/L (ref 3.5–5.1)
Sodium: 139 mmol/L (ref 136–145)
Total Protein: 6.5 g/dL (ref 6.4–8.3)

## 2017-10-07 MED ORDER — DEXAMETHASONE SODIUM PHOSPHATE 10 MG/ML IJ SOLN
10.0000 mg | Freq: Once | INTRAMUSCULAR | Status: AC
  Administered 2017-10-07: 10 mg via INTRAVENOUS

## 2017-10-07 MED ORDER — SODIUM CHLORIDE 0.9% FLUSH
10.0000 mL | INTRAVENOUS | Status: DC | PRN
  Administered 2017-10-07: 10 mL
  Filled 2017-10-07: qty 10

## 2017-10-07 MED ORDER — HEPARIN SOD (PORK) LOCK FLUSH 100 UNIT/ML IV SOLN
500.0000 [IU] | Freq: Once | INTRAVENOUS | Status: AC | PRN
  Administered 2017-10-07: 500 [IU]
  Filled 2017-10-07: qty 5

## 2017-10-07 MED ORDER — DEXAMETHASONE SODIUM PHOSPHATE 10 MG/ML IJ SOLN
INTRAMUSCULAR | Status: AC
  Filled 2017-10-07: qty 1

## 2017-10-07 MED ORDER — SODIUM CHLORIDE 0.9 % IV SOLN
60.0000 mg/m2 | Freq: Once | INTRAVENOUS | Status: AC
  Administered 2017-10-07: 120 mg via INTRAVENOUS
  Filled 2017-10-07: qty 12

## 2017-10-07 MED ORDER — SODIUM CHLORIDE 0.9 % IV SOLN
Freq: Once | INTRAVENOUS | Status: AC
  Administered 2017-10-07: 10:00:00 via INTRAVENOUS

## 2017-10-07 MED ORDER — SODIUM CHLORIDE 0.9% FLUSH
10.0000 mL | INTRAVENOUS | Status: DC | PRN
  Administered 2017-10-07: 10 mL via INTRAVENOUS
  Filled 2017-10-07: qty 10

## 2017-10-07 NOTE — Telephone Encounter (Signed)
Scheduled appt per 2/20 los.  

## 2017-10-07 NOTE — Patient Instructions (Signed)
[facility] Discharge Instructions for Patients Receiving Chemotherapy  Today you received the following chemotherapy agents: Taxotere  To help prevent nausea and vomiting after your treatment, we encourage you to take your nausea medication as directed.    If you develop nausea and vomiting that is not controlled by your nausea medication, call the clinic.   BELOW ARE SYMPTOMS THAT SHOULD BE REPORTED IMMEDIATELY:  *FEVER GREATER THAN 100.5 F  *CHILLS WITH OR WITHOUT FEVER  NAUSEA AND VOMITING THAT IS NOT CONTROLLED WITH YOUR NAUSEA MEDICATION  *UNUSUAL SHORTNESS OF BREATH  *UNUSUAL BRUISING OR BLEEDING  TENDERNESS IN MOUTH AND THROAT WITH OR WITHOUT PRESENCE OF ULCERS  *URINARY PROBLEMS  *BOWEL PROBLEMS  UNUSUAL RASH Items with * indicate a potential emergency and should be followed up as soon as possible.  Feel free to call the clinic should you have any questions or concerns. The clinic phone number is [phone].  Please show the CHEMO ALERT CARD at check-in to the Emergency Department and triage nurse.   

## 2017-10-07 NOTE — Progress Notes (Signed)
Alec Harris OFFICE PROGRESS NOTE  Patient, No Pcp Per No address on file  DIAGNOSIS: 11 year oldmanwithcastration resistant prostate cancer. He was diagnosed in 2003 with Gleason score 4 + 4 = 8.He has metastatic disease with involvement of lymph nodes and bone.  PRIOR THERAPY: He underwent a prostatectomy with a pathology that showed prostatic adenocarcinoma. Gleason score 4+4 equals 8, with the tumor involving the margins and the pathologic staging was pT4 N1 with 1 of 2 left pelvic lymph nodes involved.   He was started on adjuvant hormone therapy with Lupron. He developed a rising PSA and bone metastasis.   He was then treated with ketoconazole and prednisone. He developed a rising PSA with a doubling time of just over 6 months. Bone scan in September 2013 showed increased uptake in the right lower lumbar spine at L4 and L5 and abnormal uptake in the mid sternum which had progressed since the prior bone scan.  Zytiga 1000 mg daily started in October 2013. Be discontinued in October 2018 after progression of disease.  CURRENT THERAPY: Taxotere chemotherapy at 75 mg per meter square started on 06/02/2017.The dose was reduced to 60 mg/m starting with cycle 3.  He is here for cycle7of chemotherapy.  Lupron given at at [facility] urology.  INTERVAL HISTORY: Alec Harris 72 y.o. male returns for a routine follow-up visit.  The patient continues to tolerate his chemotherapy without major complications.  The patient denies fevers and chills.  Denies chest pain, shortness of breath, cough, hemoptysis.  He denies nausea, vomiting, constipation, diarrhea.  Reports that he has a good appetite and denies weight loss.  The patient has no reports of bone pain today.  He denies peripheral neuropathy related to his chemotherapy.  He denies any headaches blurred vision or double vision.He denies any seizures or syncope. He denied any palpitations and leg edema.Denies  hematuria or other evidence of bleeding. Does not report any hematochezia or melena. Does not report any frequency urgency or hesitancy. Does not report any petechiae.He does not report any skeletal complaints of arthralgias or myalgias or joint swelling. He does not report any mood issues including depression or anxiety. He does not report any skin rashes or lesions. Remainder of his review of systemsis negative.  The patient is here for evaluation prior to cycle #7 of his chemotherapy.  MEDICAL HISTORY: Past Medical History:  Diagnosis Date  . Carotid artery disease ([place])    By doppler  . Claudication ([place]) 02/06/2012   ABI of 0.5 on both legs ,Rgt ext. iliac70 to 99%,rgt SFA occlude,lft ext.iliac common fem 70 to 99% ,Lft SFA occluded,mono flow popliteal  . Hyperlipemia   . PAD (peripheral artery disease) ([place]) 01/2012   severe diffuse his iliacs,SFA, carotids  . Prostate cancer ([place])   . Rectal bleeding 07/24/11   Diverticular Bleeding  . Tobacco abuse     ALLERGIES:  has No Known Allergies.  MEDICATIONS:  Current Outpatient Medications  Medication Sig Dispense Refill  . amLODipine (NORVASC) 5 MG tablet TAKE 1 TABLET (5 MG TOTAL) BY MOUTH DAILY. 90 tablet 3  . aspirin EC 81 MG tablet Take 81 mg by mouth daily.    [name] atorvastatin (LIPITOR) 20 MG tablet TAKE 1 TABLET BY MOUTH EVERY DAY 90 tablet 1  . Calcium Carb-Cholecalciferol (CALCIUM 500 +D) 500-400 MG-UNIT TABS Take 1 tablet by mouth 2 (two) times daily.    . cilostazol (PLETAL) 100 MG tablet TAKE 1 TABLET (100 MG TOTAL) BY MOUTH 2 (TWO)  TIMES DAILY. 60 tablet 11  . lidocaine-prilocaine (EMLA) cream Apply 1 application topically as needed. 30 g 0  . prochlorperazine (COMPAZINE) 10 MG tablet Take 1 tablet (10 mg total) by mouth every 6 (six) hours as needed for nausea or vomiting. 30 tablet 0  . rosuvastatin (CRESTOR) 10 MG tablet TAKE 1 TABLET (10 MG TOTAL) BY MOUTH DAILY. 30 tablet 6  . traMADol (ULTRAM) 50 MG tablet Take 1  tablet (50 mg total) by mouth every 6 (six) hours as needed. 30 tablet 1   No current facility-administered medications for this visit.     SURGICAL HISTORY:  Past Surgical History:  Procedure Laterality Date  . CAROTID ANGIOGRAM  03/02/2012   bilateral carotid artery stenosis,95% right and 80% left with a type 2 arch  . CAROTID ANGIOGRAM N/A 03/02/2012   Procedure: CAROTID ANGIOGRAM;  Surgeon: Lorretta Harp, MD;  Location: [facility] CATH LAB;  Service: Cardiovascular;  Laterality: N/A;  . CAROTID ENDARTERECTOMY Right 04-08-12   CEA  . CAROTID STENT INSERTION Left 07/05/2013   Procedure: CAROTID STENT INSERTION;  Surgeon: Serafina Mitchell, MD;  Location: [facility] CATH LAB;  Service: Cardiovascular;  Laterality: Left;  . CERVICAL SPINE SURGERY  approx. 10 years ago   disc removed from neck  . COLONOSCOPY  07/25/2011   Procedure: COLONOSCOPY;  Surgeon: Beryle Beams;  Location: [facility] ENDOSCOPY;  Service: Endoscopy;  Laterality: N/A;  . DOPPLER ECHOCARDIOGRAPHY  03/24/2012   EF >70% LV normal  . ENDARTERECTOMY  04/08/2012   Procedure: ENDARTERECTOMY CAROTID;  Surgeon: Serafina Mitchell, MD;  Location: [facility] OR;  Service: Vascular;  Laterality: Right;  Right carotid endarterectomy with vascuguard 1cm x 6cm bovine patch angioplasty.   . IR FLUORO GUIDE PORT INSERTION RIGHT  06/05/2017  . IR US GUIDE VASC ACCESS RIGHT  06/05/2017  . KNEE CARTILAGE SURGERY     surgery for a torn ligament  . Lexiscan Myoview  01/14/2012   small fixed basal to mid inferior bowel attenuation artifact. no reversible ischemia  . LOWER EXTREMITY ANGIOGRAM N/A 03/02/2012   Procedure: LOWER EXTREMITY ANGIOGRAM;  Surgeon: Lorretta Harp, MD;  Location: [facility] CATH LAB;  Service: Cardiovascular;  Laterality: N/A;  . PENILE PROSTHESIS IMPLANT    . PROSTATE SURGERY  2003   for  prostate cancer  . PV angiogram  03/02/2012   total SFAs bilaterally with 3- vessel runoff below the knee, high-grade calcified iliac diseaese    REVIEW OF SYSTEMS:    Review of Systems  Constitutional: Negative for appetite change, chills, fatigue, fever and unexpected weight change.  HENT:   Negative for mouth sores, nosebleeds, sore throat and trouble swallowing.   Eyes: Negative for eye problems and icterus.  Respiratory: Negative for cough, hemoptysis, shortness of breath and wheezing.   Cardiovascular: Negative for chest pain and leg swelling.  Gastrointestinal: Negative for abdominal pain, constipation, diarrhea, nausea and vomiting.  Genitourinary: Negative for bladder incontinence, difficulty urinating, dysuria, frequency and hematuria.   Musculoskeletal: Negative for back pain, gait problem, neck pain and neck stiffness.  Skin: Negative for itching and rash.  Neurological: Negative for dizziness, extremity weakness, gait problem, headaches, light-headedness and seizures.  Hematological: Negative for adenopathy. Does not bruise/bleed easily.  Psychiatric/Behavioral: Negative for confusion, depression and sleep disturbance. The patient is not nervous/anxious.     PHYSICAL EXAMINATION:  Blood pressure 125/65, pulse 75, temperature (!) 97.2 F (36.2 C), temperature source Oral, resp. rate 15, weight 177 lb 9.6 oz (80.6 kg), SpO2 100 %.  ECOG PERFORMANCE STATUS: 1 - Symptomatic but completely ambulatory  Physical Exam  Constitutional: Oriented to person, place, and time and well-developed, well-nourished, and in no distress. No distress.  HENT:  Head: Normocephalic and atraumatic.  Mouth/Throat: Oropharynx is clear and moist. No oropharyngeal exudate.  Eyes: Conjunctivae are normal. Right eye exhibits no discharge. Left eye exhibits no discharge. No scleral icterus.  Neck: Normal range of motion. Neck supple.  Cardiovascular: Normal rate, regular rhythm, normal heart sounds and intact distal pulses.   Pulmonary/Chest: Effort normal and breath sounds normal. No respiratory distress. No wheezes. No rales.  Abdominal: Soft. Bowel sounds are  normal. Exhibits no distension and no mass. There is no tenderness.  Musculoskeletal: Normal range of motion. Exhibits no edema.  Lymphadenopathy:    No cervical adenopathy.  Neurological: Alert and oriented to person, place, and time. Exhibits normal muscle tone. Gait normal. Coordination normal.  Skin: Skin is warm and dry. No rash noted. Not diaphoretic. No erythema. No pallor.  Psychiatric: Mood, memory and judgment normal.  Vitals reviewed.  LABORATORY DATA: Lab Results  Component Value Date   WBC 6.9 10/07/2017   HGB 10.8 (L) 10/07/2017   HCT 34.0 (L) 10/07/2017   MCV 91.2 10/07/2017   PLT 258 10/07/2017      Chemistry      Component Value Date/Time   NA 137 09/16/2017 0901   NA 137 08/05/2017 0839   K 3.7 09/16/2017 0901   K 3.9 08/05/2017 0839   CL 108 09/16/2017 0901   CL 106 02/01/2013 1120   CO2 22 09/16/2017 0901   CO2 23 08/05/2017 0839   BUN 12 09/16/2017 0901   BUN 12.2 08/05/2017 0839   CREATININE 0.85 09/16/2017 0901   CREATININE 0.9 08/05/2017 0839      Component Value Date/Time   CALCIUM 8.5 09/16/2017 0901   CALCIUM 8.6 08/05/2017 0839   ALKPHOS 110 09/16/2017 0901   ALKPHOS 146 08/05/2017 0839   AST 7 09/16/2017 0901   AST 7 08/05/2017 0839   ALT <6 09/16/2017 0901   ALT <6 08/05/2017 0839   BILITOT 0.3 09/16/2017 0901   BILITOT 0.46 08/05/2017 0839     Results for Alec Harris, Alec Harris (MRN [id number]) as of 10/07/2017 10:21  Ref. Range 06/24/2017 08:29 07/15/2017 08:11 08/05/2017 08:38 08/26/2017 09:07 09/16/2017 09:01  Prostate Specific Ag, Serum Latest Ref Range: 0.0 - 4.0 ng/mL 32.4 (H) 30.5 (H) 25.6 (H) 15.8 (H) 10.2 (H)    RADIOGRAPHIC STUDIES:  No results found.   ASSESSMENT/PLAN:  72 year old gentleman with the following issues:  1. Castration resistant prostate cancer with metastatic disease to the bone.   He is status postmultiple therapies in the past outlined above. His last treatment was with Fabio Asa that was started on October  2013. Therapy to be discontinued in October 2018 because of progression of disease.  His PSA in July 2018 was 12.2 and bone scan obtained on 05/18/2017 showed marked progression of disease.  He started Taxotere 75 mg/m every 3 weeks on 06/02/2017.His dose was reduced to 60 mg/m2starting with cycle 3.  Chemotherapy continues to result in clinical benefit improvement in his bone pain. Risks and benefits of continuing chemotherapy was reviewed today and he is agreeable to continue. Plan is to continue with the same dose and schedule. Anticipate completing 10 cycles.  PSA is pending today.  His last PSA on 09/16/2017 was down to 10.2.  2. Androgen deprivation. He remains on Lupron given at [facility] urology every 4 months.It was addressed  today and I urged him to continue this indefinitely.  3. Bone directed therapy. Continue calcium and vitamin D supplements.We will continue to await dental clearance before starting Xgeva. Complications associated with this medication include osteonecrosis of the jaw among others. Importance of this medication is event skeletal related events it is related to prostate cancer.  4. Anemia: Hemoglobin is 10.8 and is overall stable.This is related to chemotherapy and malignancy and does not require intervention.  5. Sternal pain:Related to prostate cancer. Pain appears to be controlled at this time after the start of chemotherapy.  6. IV access: Port-A-Cathremain in place without any issues or complications. This will continue to be used.  7. Antiemetics:Compazine has been effective in controlling his nausea. I recommended continuing the same dose for the time being.  8. Neutropenia prophylaxis:He will continue Neulasta after each cycle of chemotherapy to prevent neutropenic sepsis.  9. Dental infection: Resolved. No fevers noted at this time.  10. Followup. In 3 weeks for cycle 8 of chemotherapy.   No orders of the defined  types were placed in this encounter.   Mikey Bussing, DNP, AGPCNP-BC, AOCNP 10/07/17

## 2017-10-08 ENCOUNTER — Inpatient Hospital Stay: Payer: Medicare Other

## 2017-10-08 VITALS — BP 128/61 | HR 70 | Temp 98.2°F | Resp 18

## 2017-10-08 DIAGNOSIS — C61 Malignant neoplasm of prostate: Secondary | ICD-10-CM

## 2017-10-08 DIAGNOSIS — Z5111 Encounter for antineoplastic chemotherapy: Secondary | ICD-10-CM | POA: Diagnosis not present

## 2017-10-08 DIAGNOSIS — C779 Secondary and unspecified malignant neoplasm of lymph node, unspecified: Secondary | ICD-10-CM | POA: Diagnosis not present

## 2017-10-08 DIAGNOSIS — Z7689 Persons encountering health services in other specified circumstances: Secondary | ICD-10-CM | POA: Diagnosis not present

## 2017-10-08 DIAGNOSIS — C7951 Secondary malignant neoplasm of bone: Secondary | ICD-10-CM | POA: Diagnosis not present

## 2017-10-08 DIAGNOSIS — R9721 Rising PSA following treatment for malignant neoplasm of prostate: Secondary | ICD-10-CM | POA: Diagnosis not present

## 2017-10-08 LAB — PROSTATE-SPECIFIC AG, SERUM (LABCORP): PROSTATE SPECIFIC AG, SERUM: 7.9 ng/mL — AB (ref 0.0–4.0)

## 2017-10-08 MED ORDER — PEGFILGRASTIM INJECTION 6 MG/0.6ML ~~LOC~~
6.0000 mg | PREFILLED_SYRINGE | Freq: Once | SUBCUTANEOUS | Status: AC
  Administered 2017-10-08: 6 mg via SUBCUTANEOUS

## 2017-10-08 MED ORDER — PEGFILGRASTIM INJECTION 6 MG/0.6ML ~~LOC~~
PREFILLED_SYRINGE | SUBCUTANEOUS | Status: AC
  Filled 2017-10-08: qty 0.6

## 2017-10-08 NOTE — Patient Instructions (Signed)
Pegfilgrastim injection What is this medicine? PEGFILGRASTIM (PEG fil gra stim) is a long-acting granulocyte colony-stimulating factor that stimulates the growth of neutrophils, a type of white blood cell important in the body's fight against infection. It is used to reduce the incidence of fever and infection in patients with certain types of cancer who are receiving chemotherapy that affects the bone marrow, and to increase survival after being exposed to high doses of radiation. This medicine may be used for other purposes; ask your health care provider or pharmacist if you have questions. COMMON BRAND NAME(S): Neulasta What should I tell my health care provider before I take this medicine? They need to know if you have any of these conditions: -kidney disease -latex allergy -ongoing radiation therapy -sickle cell disease -skin reactions to acrylic adhesives (On-Body Injector only) -an unusual or allergic reaction to pegfilgrastim, filgrastim, other medicines, foods, dyes, or preservatives -pregnant or trying to get pregnant -breast-feeding How should I use this medicine? This medicine is for injection under the skin. If you get this medicine at home, you will be taught how to prepare and give the pre-filled syringe or how to use the On-body Injector. Refer to the patient Instructions for Use for detailed instructions. Use exactly as directed. Tell your healthcare provider immediately if you suspect that the On-body Injector may not have performed as intended or if you suspect the use of the On-body Injector resulted in a missed or partial dose. It is important that you put your used needles and syringes in a special sharps container. Do not put them in a trash can. If you do not have a sharps container, call your pharmacist or healthcare provider to get one. Talk to your pediatrician regarding the use of this medicine in children. While this drug may be prescribed for selected conditions,  precautions do apply. Overdosage: If you think you have taken too much of this medicine contact a poison control center or emergency room at once. NOTE: This medicine is only for you. Do not share this medicine with others. What if I miss a dose? It is important not to miss your dose. Call your doctor or health care professional if you miss your dose. If you miss a dose due to an On-body Injector failure or leakage, a new dose should be administered as soon as possible using a single prefilled syringe for manual use. What may interact with this medicine? Interactions have not been studied. Give your health care provider a list of all the medicines, herbs, non-prescription drugs, or dietary supplements you use. Also tell them if you smoke, drink alcohol, or use illegal drugs. Some items may interact with your medicine. This list may not describe all possible interactions. Give your health care provider a list of all the medicines, herbs, non-prescription drugs, or dietary supplements you use. Also tell them if you smoke, drink alcohol, or use illegal drugs. Some items may interact with your medicine. What should I watch for while using this medicine? You may need blood work done while you are taking this medicine. If you are going to need a MRI, CT scan, or other procedure, tell your doctor that you are using this medicine (On-Body Injector only). What side effects may I notice from receiving this medicine? Side effects that you should report to your doctor or health care professional as soon as possible: -allergic reactions like skin rash, itching or hives, swelling of the face, lips, or tongue -dizziness -fever -pain, redness, or irritation at site   where injected -pinpoint red spots on the skin -red or dark-brown urine -shortness of breath or breathing problems -stomach or side pain, or pain at the shoulder -swelling -tiredness -trouble passing urine or change in the amount of urine Side  effects that usually do not require medical attention (report to your doctor or health care professional if they continue or are bothersome): -bone pain -muscle pain This list may not describe all possible side effects. Call your doctor for medical advice about side effects. You may report side effects to FDA at 1-800-FDA-1088. Where should I keep my medicine? Keep out of the reach of children. Store pre-filled syringes in a refrigerator between 2 and 8 degrees C (36 and 46 degrees F). Do not freeze. Keep in carton to protect from light. Throw away this medicine if it is left out of the refrigerator for more than 48 hours. Throw away any unused medicine after the expiration date. NOTE: This sheet is a summary. It may not cover all possible information. If you have questions about this medicine, talk to your doctor, pharmacist, or health care provider.  2018 Elsevier/Gold Standard (2016-07-31 12:58:03)  

## 2017-10-19 ENCOUNTER — Encounter: Payer: Self-pay | Admitting: *Deleted

## 2017-10-28 ENCOUNTER — Inpatient Hospital Stay: Payer: Medicare Other | Attending: Oncology

## 2017-10-28 ENCOUNTER — Inpatient Hospital Stay: Payer: Medicare Other

## 2017-10-28 ENCOUNTER — Inpatient Hospital Stay (HOSPITAL_BASED_OUTPATIENT_CLINIC_OR_DEPARTMENT_OTHER): Payer: Medicare Other | Admitting: Oncology

## 2017-10-28 ENCOUNTER — Telehealth: Payer: Self-pay | Admitting: Oncology

## 2017-10-28 VITALS — BP 140/49 | HR 70 | Temp 97.0°F | Resp 16 | Wt 179.7 lb

## 2017-10-28 DIAGNOSIS — Z7689 Persons encountering health services in other specified circumstances: Secondary | ICD-10-CM | POA: Insufficient documentation

## 2017-10-28 DIAGNOSIS — Z79899 Other long term (current) drug therapy: Secondary | ICD-10-CM | POA: Insufficient documentation

## 2017-10-28 DIAGNOSIS — C61 Malignant neoplasm of prostate: Secondary | ICD-10-CM

## 2017-10-28 DIAGNOSIS — G893 Neoplasm related pain (acute) (chronic): Secondary | ICD-10-CM | POA: Insufficient documentation

## 2017-10-28 DIAGNOSIS — D649 Anemia, unspecified: Secondary | ICD-10-CM | POA: Diagnosis not present

## 2017-10-28 DIAGNOSIS — Z7982 Long term (current) use of aspirin: Secondary | ICD-10-CM | POA: Diagnosis not present

## 2017-10-28 DIAGNOSIS — R5383 Other fatigue: Secondary | ICD-10-CM | POA: Diagnosis not present

## 2017-10-28 DIAGNOSIS — C779 Secondary and unspecified malignant neoplasm of lymph node, unspecified: Secondary | ICD-10-CM

## 2017-10-28 DIAGNOSIS — C7951 Secondary malignant neoplasm of bone: Secondary | ICD-10-CM

## 2017-10-28 DIAGNOSIS — Z79818 Long term (current) use of other agents affecting estrogen receptors and estrogen levels: Secondary | ICD-10-CM | POA: Diagnosis not present

## 2017-10-28 DIAGNOSIS — Z9079 Acquired absence of other genital organ(s): Secondary | ICD-10-CM | POA: Insufficient documentation

## 2017-10-28 DIAGNOSIS — Z5111 Encounter for antineoplastic chemotherapy: Secondary | ICD-10-CM | POA: Diagnosis not present

## 2017-10-28 DIAGNOSIS — Z95828 Presence of other vascular implants and grafts: Secondary | ICD-10-CM

## 2017-10-28 LAB — COMPREHENSIVE METABOLIC PANEL
ALT: 6 U/L (ref 0–55)
AST: 9 U/L (ref 5–34)
Albumin: 3 g/dL — ABNORMAL LOW (ref 3.5–5.0)
Alkaline Phosphatase: 96 U/L (ref 40–150)
Anion gap: 5 (ref 3–11)
BUN: 12 mg/dL (ref 7–26)
CHLORIDE: 110 mmol/L — AB (ref 98–109)
CO2: 24 mmol/L (ref 22–29)
CREATININE: 0.89 mg/dL (ref 0.70–1.30)
Calcium: 8.8 mg/dL (ref 8.4–10.4)
Glucose, Bld: 141 mg/dL — ABNORMAL HIGH (ref 70–140)
POTASSIUM: 3.9 mmol/L (ref 3.5–5.1)
Sodium: 139 mmol/L (ref 136–145)
Total Bilirubin: 0.3 mg/dL (ref 0.2–1.2)
Total Protein: 6.3 g/dL — ABNORMAL LOW (ref 6.4–8.3)

## 2017-10-28 LAB — CBC WITH DIFFERENTIAL/PLATELET
BASOS ABS: 0.1 10*3/uL (ref 0.0–0.1)
Basophils Relative: 1 %
EOS ABS: 0 10*3/uL (ref 0.0–0.5)
Eosinophils Relative: 0 %
HCT: 33 % — ABNORMAL LOW (ref 38.4–49.9)
Hemoglobin: 10.5 g/dL — ABNORMAL LOW (ref 13.0–17.1)
LYMPHS ABS: 2.3 10*3/uL (ref 0.9–3.3)
LYMPHS PCT: 34 %
MCH: 28.6 pg (ref 27.2–33.4)
MCHC: 31.9 g/dL — ABNORMAL LOW (ref 32.0–36.0)
MCV: 89.8 fL (ref 79.3–98.0)
Monocytes Absolute: 0.7 10*3/uL (ref 0.1–0.9)
Monocytes Relative: 11 %
NEUTROS PCT: 54 %
Neutro Abs: 3.5 10*3/uL (ref 1.5–6.5)
Platelets: 317 10*3/uL (ref 140–400)
RBC: 3.67 MIL/uL — ABNORMAL LOW (ref 4.20–5.82)
RDW: 19.5 % — ABNORMAL HIGH (ref 11.0–14.6)
WBC: 6.7 10*3/uL (ref 4.0–10.3)

## 2017-10-28 MED ORDER — SODIUM CHLORIDE 0.9% FLUSH
10.0000 mL | INTRAVENOUS | Status: DC | PRN
  Administered 2017-10-28: 10 mL
  Filled 2017-10-28: qty 10

## 2017-10-28 MED ORDER — DEXAMETHASONE SODIUM PHOSPHATE 10 MG/ML IJ SOLN
10.0000 mg | Freq: Once | INTRAMUSCULAR | Status: AC
  Administered 2017-10-28: 10 mg via INTRAVENOUS

## 2017-10-28 MED ORDER — SODIUM CHLORIDE 0.9 % IV SOLN
Freq: Once | INTRAVENOUS | Status: AC
  Administered 2017-10-28: 10:00:00 via INTRAVENOUS

## 2017-10-28 MED ORDER — HEPARIN SOD (PORK) LOCK FLUSH 100 UNIT/ML IV SOLN
500.0000 [IU] | Freq: Once | INTRAVENOUS | Status: AC | PRN
  Administered 2017-10-28: 500 [IU]
  Filled 2017-10-28: qty 5

## 2017-10-28 MED ORDER — SODIUM CHLORIDE 0.9% FLUSH
10.0000 mL | INTRAVENOUS | Status: DC | PRN
  Administered 2017-10-28: 10 mL via INTRAVENOUS
  Filled 2017-10-28: qty 10

## 2017-10-28 MED ORDER — DEXAMETHASONE SODIUM PHOSPHATE 10 MG/ML IJ SOLN
INTRAMUSCULAR | Status: AC
  Filled 2017-10-28: qty 1

## 2017-10-28 MED ORDER — SODIUM CHLORIDE 0.9 % IV SOLN
60.0000 mg/m2 | Freq: Once | INTRAVENOUS | Status: AC
  Administered 2017-10-28: 120 mg via INTRAVENOUS
  Filled 2017-10-28: qty 12

## 2017-10-28 NOTE — Telephone Encounter (Signed)
Scheduled appt per 3/13 los - Gave patient AVS and calender per los.  

## 2017-10-28 NOTE — Progress Notes (Signed)
Hematology and Oncology Follow Up Visit  Alec Harris [id number] [birth year] 72 y.o. 10/28/2017 9:19 AM Patient, No Pcp PerShadad, Harris Dad, MD   Principle Diagnosis: 39 year old man with advanced castration resistant prostate cancer with disease to the bone and lymph nodes.[name] He was diagnosed in 2003 with Gleason score 4 + 4 = 8.    Prior Therapy:  He underwent a prostatectomy with a pathology that showed prostatic adenocarcinoma. Gleason score 4+4 equals 8, with the tumor involving the margins and the pathologic staging was pT4 N1 with 1 of 2 left pelvic lymph nodes involved.   He was started on adjuvant hormone therapy with Lupron. He developed a rising PSA and bone metastasis.   He was then treated with ketoconazole and prednisone. He developed a rising PSA with a doubling time of just over 6 months. Bone scan in September 2013 showed increased uptake in the right lower lumbar spine at L4 and L5 and abnormal uptake in the mid sternum which had progressed since the prior bone scan.  Zytiga 1000 mg daily started in October 2013.  Be discontinued in October 2018 after progression of disease.   Current therapy:  Taxotere chemotherapy at 75 mg per meter square started on 06/02/2017.  The dose was reduced to 60 mg/m starting with cycle 3.  He is here for cycle 8 of chemotherapy.  Lupron given at at [facility] urology.  Interim History: Alec Harris here for a follow-up. He reports no major changes since the last visit, he tolerated the last cycle of chemotherapy without any new complications. He does report grade 1 fatigue and very little sensory neuropathy in his upper extremities. He denies any nausea, vomiting or excessive tiredness or debilitation. He denied any infusion related toxicities. He continues to ambulate without any difficulties and attends activities of daily living. He continues to drive without any difficulties.  His bone pain has significantly improved since the start of  chemotherapy. He denied any arthralgias or myalgias. He denied any Case is related to Neulasta.  He denies any headaches blurred vision or double vision.  He denies any seizures or syncope.  He denied any chest pain, palpitation or leg edema.  He denies any cough, wheezing or hemoptysis.  He reports no nausea or vomiting. Denies hematuria or other evidence of bleeding. Does not report any hematochezia or melena. Does not report any frequency urgency or hesitancy. Does not report any rash or lesions. Does not report any petechiae.  He does not report any skeletal complaints of arthralgias or myalgias or joint swelling.  He does not report any mood issues including depression or anxiety.  He does not report any skin rashes or lesions.  Remainder of his review of systems is negative.   Medications: I have reviewed the patient's current medications and remained without change. Current Outpatient Medications  Medication Sig Dispense Refill  . amLODipine (NORVASC) 5 MG tablet TAKE 1 TABLET (5 MG TOTAL) BY MOUTH DAILY. 90 tablet 3  . aspirin EC 81 MG tablet Take 81 mg by mouth daily.    [name] atorvastatin (LIPITOR) 20 MG tablet TAKE 1 TABLET BY MOUTH EVERY DAY 90 tablet 1  . Calcium Carb-Cholecalciferol (CALCIUM 500 +D) 500-400 MG-UNIT TABS Take 1 tablet by mouth 2 (two) times daily.    . cilostazol (PLETAL) 100 MG tablet TAKE 1 TABLET (100 MG TOTAL) BY MOUTH 2 (TWO) TIMES DAILY. 60 tablet 11  . lidocaine-prilocaine (EMLA) cream Apply 1 application topically as needed. 30 g 0  .  prochlorperazine (COMPAZINE) 10 MG tablet Take 1 tablet (10 mg total) by mouth every 6 (six) hours as needed for nausea or vomiting. 30 tablet 0  . rosuvastatin (CRESTOR) 10 MG tablet TAKE 1 TABLET (10 MG TOTAL) BY MOUTH DAILY. 30 tablet 6  . traMADol (ULTRAM) 50 MG tablet Take 1 tablet (50 mg total) by mouth every 6 (six) hours as needed. (Patient not taking: Reported on 10/07/2017) 30 tablet 1   No current facility-administered  medications for this visit.    Facility-Administered Medications Ordered in Other Visits  Medication Dose Route Frequency Provider Last Rate Last Dose  . sodium chloride flush (NS) 0.9 % injection 10 mL  10 mL Intravenous PRN [name], MD   10 mL at 10/28/17 9702   His past medical history, family history and social history reviewed and updated today.  Allergies: No Known Allergies   Physical Exam: Blood pressure (!) 140/49, pulse 70, temperature (!) 97 F (36.1 C), temperature source Oral, resp. rate 16, weight 179 lb 11.2 oz (81.5 kg), SpO2 96 %.   ECOG: 1 General appearance: A comfortable-appearing gentleman without distress. Head: Atraumatic without abnormalities. Oral mucosa: Extremities are moist and pink. Eyes: Inject Alec Harris not injected. Sclerae anicteric. Lymph nodes: No lymphadenopathy palpated in the cervical, axillary and supraclavicular areas. Heart: Regular rate and rhythm without murmurs or gallops. Lung: Clear to auscultation without any rhonchi, wheezes or dullness to percussion. Abdomen: Soft, nontender without any rebound or guarding. Good bowel sounds. Musculoskeletal: No joint deformity or effusion. Neurological examination: No motor, sensory deficits. Skin: No ecchymosis or petechiae. Psych: Mood and affect appeared appropriate.  Lab Results: Lab Results  Component Value Date   WBC 6.9 10/07/2017   HGB 10.8 (L) 10/07/2017   HCT 34.0 (L) 10/07/2017   MCV 91.2 10/07/2017   PLT 258 10/07/2017     Chemistry      Component Value Date/Time   NA 139 10/07/2017 0835   NA 137 08/05/2017 0839   K 4.0 10/07/2017 0835   K 3.9 08/05/2017 0839   CL 109 10/07/2017 0835   CL 106 02/01/2013 1120   CO2 21 (L) 10/07/2017 0835   CO2 23 08/05/2017 0839   BUN 12 10/07/2017 0835   BUN 12.2 08/05/2017 0839   CREATININE 0.88 10/07/2017 0835   CREATININE 0.9 08/05/2017 0839      Component Value Date/Time   CALCIUM 8.9 10/07/2017 0835   CALCIUM 8.6  08/05/2017 0839   ALKPHOS 110 10/07/2017 0835   ALKPHOS 146 08/05/2017 0839   AST 9 10/07/2017 0835   AST 7 08/05/2017 0839   ALT 7 10/07/2017 0835   ALT <6 08/05/2017 0839   BILITOT 0.3 10/07/2017 0835   BILITOT 0.46 08/05/2017 0839      Results for EZELL, POKE (MRN [id number]) as of 10/28/2017 09:14  Ref. Range 08/26/2017 09:07 09/16/2017 09:01 10/07/2017 08:35  Prostate Specific Ag, Serum Latest Ref Range: 0.0 - 4.0 ng/mL 15.8 (H) 10.2 (H) 7.9 (H)       Impression and Plan:   72 year old man with  1. Advanced castration resistant prostate cancer with his initial diagnosis in 2003. He developed castration resistant disease with bone and lymphadenopathy.   He is currently on Taxotere chemotherapy after he has progressed on Zytiga. His PSA continues to show excellent response with continuous decline most recently to 7.9 from 32.  The natural course of this disease as well as risks and benefits of continuing chemotherapy was discussed today. Long-term complications associated  with chemotherapy were reviewed today.  After discussion today, he is agreeable to continue with chemotherapy as planned with the plan is to continue at least 10 cycles of chemotherapy depending on his tolerance.  2. Androgen deprivation. Risks and benefits of continuing Lupron long-term was discussed today. I recommended continuing that indefinitely.  3. Bone directed therapy. I recommended continuing calcium and vitamin D supplements. And consider starting Xgeva on obtaining dental clearance. This will be considered after discontinuation of chemotherapy.  4. Anemia: Hemoglobin remains relatively stable above 10 without any symptoms. No intervention is needed at this time.  5. Bone pain: Continues to improve with systemic chemotherapy.  6. IV access: Port-A-Cath remains in use without complications.  7. Antiemetics: No nausea or vomiting reported since the last visit. Compazine remain in use.  8.  Neutropenia prophylaxis: I recommended continuing the last time after each cycle of chemotherapy to prevent neutropenic sepsis.  9. Followup.  In 3 weeks for his next cycle of chemotherapy.   25 minutes was spent face-to-face with the patient today.  More than 50% was spent in counseling, education and coordination of his multifaceted care.    Zola Button, MD 3/13/20199:19 AM

## 2017-10-28 NOTE — Patient Instructions (Signed)
[facility] Discharge Instructions for Patients Receiving Chemotherapy  Today you received the following chemotherapy agents Taxotere To help prevent nausea and vomiting after your treatment, we encourage you to take your nausea medication as prescribed.   If you develop nausea and vomiting that is not controlled by your nausea medication, call the clinic.   BELOW ARE SYMPTOMS THAT SHOULD BE REPORTED IMMEDIATELY:  *FEVER GREATER THAN 100.5 F  *CHILLS WITH OR WITHOUT FEVER  NAUSEA AND VOMITING THAT IS NOT CONTROLLED WITH YOUR NAUSEA MEDICATION  *UNUSUAL SHORTNESS OF BREATH  *UNUSUAL BRUISING OR BLEEDING  TENDERNESS IN MOUTH AND THROAT WITH OR WITHOUT PRESENCE OF ULCERS  *URINARY PROBLEMS  *BOWEL PROBLEMS  UNUSUAL RASH Items with * indicate a potential emergency and should be followed up as soon as possible.  Feel free to call the clinic should you have any questions or concerns. The clinic phone number is [phone].  Please show the CHEMO ALERT CARD at check-in to the Emergency Department and triage nurse.   

## 2017-10-29 ENCOUNTER — Telehealth: Payer: Self-pay | Admitting: *Deleted

## 2017-10-29 ENCOUNTER — Inpatient Hospital Stay: Payer: Medicare Other

## 2017-10-29 VITALS — BP 132/65 | HR 70 | Temp 97.9°F | Resp 18

## 2017-10-29 DIAGNOSIS — Z7689 Persons encountering health services in other specified circumstances: Secondary | ICD-10-CM | POA: Diagnosis not present

## 2017-10-29 DIAGNOSIS — Z5111 Encounter for antineoplastic chemotherapy: Secondary | ICD-10-CM | POA: Diagnosis not present

## 2017-10-29 DIAGNOSIS — C779 Secondary and unspecified malignant neoplasm of lymph node, unspecified: Secondary | ICD-10-CM | POA: Diagnosis not present

## 2017-10-29 DIAGNOSIS — Z79818 Long term (current) use of other agents affecting estrogen receptors and estrogen levels: Secondary | ICD-10-CM | POA: Diagnosis not present

## 2017-10-29 DIAGNOSIS — C61 Malignant neoplasm of prostate: Secondary | ICD-10-CM | POA: Diagnosis not present

## 2017-10-29 DIAGNOSIS — C7951 Secondary malignant neoplasm of bone: Secondary | ICD-10-CM | POA: Diagnosis not present

## 2017-10-29 LAB — PROSTATE-SPECIFIC AG, SERUM (LABCORP): Prostate Specific Ag, Serum: 4.7 ng/mL — ABNORMAL HIGH (ref 0.0–4.0)

## 2017-10-29 MED ORDER — PEGFILGRASTIM INJECTION 6 MG/0.6ML ~~LOC~~
6.0000 mg | PREFILLED_SYRINGE | Freq: Once | SUBCUTANEOUS | Status: AC
  Administered 2017-10-29: 6 mg via SUBCUTANEOUS

## 2017-10-29 NOTE — Telephone Encounter (Signed)
-----   Message from [name], MD sent at 10/29/2017  8:25 AM EDT ----- Please let him know his PSA is down again.

## 2017-10-29 NOTE — Telephone Encounter (Signed)
Spoke with patient, gave results of last PSA 

## 2017-10-29 NOTE — Patient Instructions (Signed)
Pegfilgrastim injection What is this medicine? PEGFILGRASTIM (PEG fil gra stim) is a long-acting granulocyte colony-stimulating factor that stimulates the growth of neutrophils, a type of white blood cell important in the body's fight against infection. It is used to reduce the incidence of fever and infection in patients with certain types of cancer who are receiving chemotherapy that affects the bone marrow, and to increase survival after being exposed to high doses of radiation. This medicine may be used for other purposes; ask your health care provider or pharmacist if you have questions. COMMON BRAND NAME(S): Neulasta What should I tell my health care provider before I take this medicine? They need to know if you have any of these conditions: -kidney disease -latex allergy -ongoing radiation therapy -sickle cell disease -skin reactions to acrylic adhesives (On-Body Injector only) -an unusual or allergic reaction to pegfilgrastim, filgrastim, other medicines, foods, dyes, or preservatives -pregnant or trying to get pregnant -breast-feeding How should I use this medicine? This medicine is for injection under the skin. If you get this medicine at home, you will be taught how to prepare and give the pre-filled syringe or how to use the On-body Injector. Refer to the patient Instructions for Use for detailed instructions. Use exactly as directed. Tell your healthcare provider immediately if you suspect that the On-body Injector may not have performed as intended or if you suspect the use of the On-body Injector resulted in a missed or partial dose. It is important that you put your used needles and syringes in a special sharps container. Do not put them in a trash can. If you do not have a sharps container, call your pharmacist or healthcare provider to get one. Talk to your pediatrician regarding the use of this medicine in children. While this drug may be prescribed for selected conditions,  precautions do apply. Overdosage: If you think you have taken too much of this medicine contact a poison control center or emergency room at once. NOTE: This medicine is only for you. Do not share this medicine with others. What if I miss a dose? It is important not to miss your dose. Call your doctor or health care professional if you miss your dose. If you miss a dose due to an On-body Injector failure or leakage, a new dose should be administered as soon as possible using a single prefilled syringe for manual use. What may interact with this medicine? Interactions have not been studied. Give your health care provider a list of all the medicines, herbs, non-prescription drugs, or dietary supplements you use. Also tell them if you smoke, drink alcohol, or use illegal drugs. Some items may interact with your medicine. This list may not describe all possible interactions. Give your health care provider a list of all the medicines, herbs, non-prescription drugs, or dietary supplements you use. Also tell them if you smoke, drink alcohol, or use illegal drugs. Some items may interact with your medicine. What should I watch for while using this medicine? You may need blood work done while you are taking this medicine. If you are going to need a MRI, CT scan, or other procedure, tell your doctor that you are using this medicine (On-Body Injector only). What side effects may I notice from receiving this medicine? Side effects that you should report to your doctor or health care professional as soon as possible: -allergic reactions like skin rash, itching or hives, swelling of the face, lips, or tongue -dizziness -fever -pain, redness, or irritation at site   where injected -pinpoint red spots on the skin -red or dark-brown urine -shortness of breath or breathing problems -stomach or side pain, or pain at the shoulder -swelling -tiredness -trouble passing urine or change in the amount of urine Side  effects that usually do not require medical attention (report to your doctor or health care professional if they continue or are bothersome): -bone pain -muscle pain This list may not describe all possible side effects. Call your doctor for medical advice about side effects. You may report side effects to FDA at 1-800-FDA-1088. Where should I keep my medicine? Keep out of the reach of children. Store pre-filled syringes in a refrigerator between 2 and 8 degrees C (36 and 46 degrees F). Do not freeze. Keep in carton to protect from light. Throw away this medicine if it is left out of the refrigerator for more than 48 hours. Throw away any unused medicine after the expiration date. NOTE: This sheet is a summary. It may not cover all possible information. If you have questions about this medicine, talk to your doctor, pharmacist, or health care provider.  2018 Elsevier/Gold Standard (2016-07-31 12:58:03)  

## 2017-11-18 ENCOUNTER — Inpatient Hospital Stay: Payer: Medicare Other

## 2017-11-18 ENCOUNTER — Telehealth: Payer: Self-pay | Admitting: Oncology

## 2017-11-18 ENCOUNTER — Inpatient Hospital Stay: Payer: Medicare Other | Attending: Oncology

## 2017-11-18 ENCOUNTER — Inpatient Hospital Stay (HOSPITAL_BASED_OUTPATIENT_CLINIC_OR_DEPARTMENT_OTHER): Payer: Medicare Other | Admitting: Oncology

## 2017-11-18 VITALS — BP 118/64 | HR 68 | Temp 97.9°F | Resp 18 | Ht 69.0 in | Wt 180.7 lb

## 2017-11-18 DIAGNOSIS — D630 Anemia in neoplastic disease: Secondary | ICD-10-CM | POA: Diagnosis not present

## 2017-11-18 DIAGNOSIS — Z79899 Other long term (current) drug therapy: Secondary | ICD-10-CM | POA: Insufficient documentation

## 2017-11-18 DIAGNOSIS — R530 Neoplastic (malignant) related fatigue: Secondary | ICD-10-CM | POA: Diagnosis not present

## 2017-11-18 DIAGNOSIS — C7951 Secondary malignant neoplasm of bone: Secondary | ICD-10-CM

## 2017-11-18 DIAGNOSIS — R590 Localized enlarged lymph nodes: Secondary | ICD-10-CM

## 2017-11-18 DIAGNOSIS — Z5111 Encounter for antineoplastic chemotherapy: Secondary | ICD-10-CM | POA: Insufficient documentation

## 2017-11-18 DIAGNOSIS — Z95828 Presence of other vascular implants and grafts: Secondary | ICD-10-CM

## 2017-11-18 DIAGNOSIS — R9721 Rising PSA following treatment for malignant neoplasm of prostate: Secondary | ICD-10-CM | POA: Insufficient documentation

## 2017-11-18 DIAGNOSIS — Z7982 Long term (current) use of aspirin: Secondary | ICD-10-CM

## 2017-11-18 DIAGNOSIS — C61 Malignant neoplasm of prostate: Secondary | ICD-10-CM | POA: Diagnosis not present

## 2017-11-18 DIAGNOSIS — Z7689 Persons encountering health services in other specified circumstances: Secondary | ICD-10-CM | POA: Insufficient documentation

## 2017-11-18 DIAGNOSIS — C779 Secondary and unspecified malignant neoplasm of lymph node, unspecified: Secondary | ICD-10-CM | POA: Insufficient documentation

## 2017-11-18 LAB — CBC WITH DIFFERENTIAL/PLATELET
Basophils Absolute: 0.1 10*3/uL (ref 0.0–0.1)
Basophils Relative: 1 %
Eosinophils Absolute: 0 10*3/uL (ref 0.0–0.5)
Eosinophils Relative: 1 %
HEMATOCRIT: 33.1 % — AB (ref 38.4–49.9)
HEMOGLOBIN: 10.6 g/dL — AB (ref 13.0–17.1)
LYMPHS ABS: 2.2 10*3/uL (ref 0.9–3.3)
Lymphocytes Relative: 39 %
MCH: 28.6 pg (ref 27.2–33.4)
MCHC: 32 g/dL (ref 32.0–36.0)
MCV: 89.2 fL (ref 79.3–98.0)
MONOS PCT: 16 %
Monocytes Absolute: 0.9 10*3/uL (ref 0.1–0.9)
NEUTROS ABS: 2.5 10*3/uL (ref 1.5–6.5)
NEUTROS PCT: 43 %
Platelets: 252 10*3/uL (ref 140–400)
RBC: 3.71 MIL/uL — AB (ref 4.20–5.82)
RDW: 18.7 % — ABNORMAL HIGH (ref 11.0–14.6)
WBC: 5.6 10*3/uL (ref 4.0–10.3)

## 2017-11-18 LAB — COMPREHENSIVE METABOLIC PANEL
ALBUMIN: 3.1 g/dL — AB (ref 3.5–5.0)
ALK PHOS: 76 U/L (ref 40–150)
ALT: 7 U/L (ref 0–55)
AST: 8 U/L (ref 5–34)
Anion gap: 5 (ref 3–11)
BUN: 11 mg/dL (ref 7–26)
CALCIUM: 8.6 mg/dL (ref 8.4–10.4)
CHLORIDE: 111 mmol/L — AB (ref 98–109)
CO2: 22 mmol/L (ref 22–29)
CREATININE: 0.83 mg/dL (ref 0.70–1.30)
GFR calc non Af Amer: >60 mL/min (ref >60)
GLUCOSE: 118 mg/dL (ref 70–140)
Potassium: 3.9 mmol/L (ref 3.5–5.1)
SODIUM: 138 mmol/L (ref 136–145)
Total Bilirubin: 0.4 mg/dL (ref 0.2–1.2)
Total Protein: 6.1 g/dL — ABNORMAL LOW (ref 6.4–8.3)

## 2017-11-18 MED ORDER — HEPARIN SOD (PORK) LOCK FLUSH 100 UNIT/ML IV SOLN
500.0000 [IU] | Freq: Once | INTRAVENOUS | Status: AC | PRN
Start: 2017-11-18 — End: 2017-11-18
  Administered 2017-11-18: 500 [IU]
  Filled 2017-11-18: qty 5

## 2017-11-18 MED ORDER — SODIUM CHLORIDE 0.9% FLUSH
10.0000 mL | INTRAVENOUS | Status: DC | PRN
  Administered 2017-11-18: 10 mL via INTRAVENOUS
  Filled 2017-11-18: qty 10

## 2017-11-18 MED ORDER — SODIUM CHLORIDE 0.9 % IV SOLN
60.0000 mg/m2 | Freq: Once | INTRAVENOUS | Status: AC
  Administered 2017-11-18: 120 mg via INTRAVENOUS
  Filled 2017-11-18: qty 12

## 2017-11-18 MED ORDER — DEXAMETHASONE SODIUM PHOSPHATE 10 MG/ML IJ SOLN
INTRAMUSCULAR | Status: AC
  Filled 2017-11-18: qty 1

## 2017-11-18 MED ORDER — SODIUM CHLORIDE 0.9% FLUSH
10.0000 mL | INTRAVENOUS | Status: DC | PRN
  Administered 2017-11-18: 10 mL
  Filled 2017-11-18: qty 10

## 2017-11-18 MED ORDER — SODIUM CHLORIDE 0.9 % IV SOLN
Freq: Once | INTRAVENOUS | Status: AC
  Administered 2017-11-18: 10:00:00 via INTRAVENOUS

## 2017-11-18 MED ORDER — DEXAMETHASONE SODIUM PHOSPHATE 10 MG/ML IJ SOLN
10.0000 mg | Freq: Once | INTRAMUSCULAR | Status: AC
  Administered 2017-11-18: 10 mg via INTRAVENOUS

## 2017-11-18 NOTE — Patient Instructions (Signed)
[facility] Discharge Instructions for Patients Receiving Chemotherapy  Today you received the following chemotherapy agents Taxotere To help prevent nausea and vomiting after your treatment, we encourage you to take your nausea medication as prescribed.   If you develop nausea and vomiting that is not controlled by your nausea medication, call the clinic.   BELOW ARE SYMPTOMS THAT SHOULD BE REPORTED IMMEDIATELY:  *FEVER GREATER THAN 100.5 F  *CHILLS WITH OR WITHOUT FEVER  NAUSEA AND VOMITING THAT IS NOT CONTROLLED WITH YOUR NAUSEA MEDICATION  *UNUSUAL SHORTNESS OF BREATH  *UNUSUAL BRUISING OR BLEEDING  TENDERNESS IN MOUTH AND THROAT WITH OR WITHOUT PRESENCE OF ULCERS  *URINARY PROBLEMS  *BOWEL PROBLEMS  UNUSUAL RASH Items with * indicate a potential emergency and should be followed up as soon as possible.  Feel free to call the clinic should you have any questions or concerns. The clinic phone number is [phone].  Please show the CHEMO ALERT CARD at check-in to the Emergency Department and triage nurse.   

## 2017-11-18 NOTE — Telephone Encounter (Signed)
Scheduled appt per 4/3 los - Gave patient AVS and calender per los.  

## 2017-11-18 NOTE — Progress Notes (Signed)
Hematology and Oncology Follow Up Visit  Alec Harris [id number] [birth year] 72 y.o. 11/18/2017 8:46 AM Patient, No Pcp Alec Saas, MD   Principle Diagnosis: 72 year old man with prostate cancer Gleason score 4+4 = 8 with his initial diagnosis in 2003.  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  Prior Therapy:  He underwent a prostatectomy with a pathology that showed prostatic adenocarcinoma. Gleason score 4+4 equals 8, with the tumor involving the margins and the pathologic staging was pT4 N1 with 1 of 2 left pelvic lymph nodes involved.   He was started on adjuvant hormone therapy with Lupron. He developed a rising PSA and bone metastasis.   He was then treated with ketoconazole and prednisone. He developed a rising PSA with a doubling time of just over 6 months. Bone scan in September 2013 showed increased uptake in the right lower lumbar spine at L4 and L5 and abnormal uptake in the mid sternum which had progressed since the prior bone scan.  Zytiga 1000 mg daily started in October 2013.  Discontinued in October 2018 after progression of disease.   Current therapy:  Taxotere chemotherapy at 75 mg per meter square started on 06/02/2017.  The dose was reduced to 60 mg/m starting with cycle 3.  He is here for cycle 9 of therapy.  Lupron given at at [facility] urology.  Interim History: [name] is here for a follow-up.  He reports no major changes in his health and continues to do reasonably well.  He did have an episode of diarrhea last 24 hours related to dairy products which she cannot tolerated times.  He denied any nausea, vomiting or abdominal distention.  He denies any worsening neuropathy or decline in his energy her performance status.  His bone pain are not completely resolved at this time.  His activity level and quality of life remain excellent.   He denies any headaches blurred vision or double vision.  He denies any seizures  or syncope.  He denied any chest pain, palpitation or leg edema.  He denies any cough, wheezing or hemoptysis.  He reports no bleeding per rectum or hemoptysis. Does not report any frequency urgency or hesitancy. Does not report any rash or lesions. Does not report any petechiae.  He does not report any arthralgias or myalgias or joint swelling.  He does not report any anxiety or depression. Remainder of his review of systems is negative.   Medications: I have reviewed the patient's current medications.  Unchanged from my review. Current Outpatient Medications  Medication Sig Dispense Refill  . amLODipine (NORVASC) 5 MG tablet TAKE 1 TABLET (5 MG TOTAL) BY MOUTH DAILY. 90 tablet 3  . aspirin EC 81 MG tablet Take 81 mg by mouth daily.    [name] atorvastatin (LIPITOR) 20 MG tablet TAKE 1 TABLET BY MOUTH EVERY DAY 90 tablet 1  . Calcium Carb-Cholecalciferol (CALCIUM 500 +D) 500-400 MG-UNIT TABS Take 1 tablet by mouth 2 (two) times daily.    . cilostazol (PLETAL) 100 MG tablet TAKE 1 TABLET (100 MG TOTAL) BY MOUTH 2 (TWO) TIMES DAILY. 60 tablet 11  . lidocaine-prilocaine (EMLA) cream Apply 1 application topically as needed. 30 g 0  . prochlorperazine (COMPAZINE) 10 MG tablet Take 1 tablet (10 mg total) by mouth every 6 (six) hours as needed for nausea or vomiting. 30 tablet 0  . rosuvastatin (CRESTOR) 10 MG tablet TAKE 1 TABLET (10 MG TOTAL) BY MOUTH DAILY. 30 tablet 6  .  traMADol (ULTRAM) 50 MG tablet Take 1 tablet (50 mg total) by mouth every 6 (six) hours as needed. (Patient not taking: Reported on 10/07/2017) 30 tablet 1   No current facility-administered medications for this visit.    Facility-Administered Medications Ordered in Other Visits  Medication Dose Route Frequency Provider Last Rate Last Dose  . sodium chloride flush (NS) 0.9 % injection 10 mL  10 mL Intravenous PRN [name], MD   10 mL at 11/18/17 0865   His past medical history, family history and social history reviewed and  updated today.  Allergies: No Known Allergies   Physical Exam:  Blood pressure 118/64, pulse 68, temperature 97.9 F (36.6 C), temperature source Oral, resp. rate 18, height 5\' 9"  (1.753 m), weight 180 lb 11.2 oz (82 kg), SpO2 100 %.   ECOG: 1 General appearance: Alert, awake gentleman without distress. Head: Normocephalic without abnormalities. Oral mucosa: Oral mucosa without thrush or ulcers. Eyes: Sclera anicteric. Lymph nodes: Cervical, axillary and supraclavicular regions without adenopathy. Heart: Regular rate without murmurs or gallops. Lung: Clear in all lung fields without wheezes or dullness to percussion. Abdomen: Soft, nontender, nondistended without shifting dullness or ascites. Musculoskeletal: No clubbing or cyanosis. Neurological examination: No deficits noted on motor, sensory exam.  Intact deep tendon reflexes. Skin: No no rashes or lesions.   Lab Results: Lab Results  Component Value Date   WBC 6.7 10/28/2017   HGB 10.5 (L) 10/28/2017   HCT 33.0 (L) 10/28/2017   MCV 89.8 10/28/2017   PLT 317 10/28/2017     Chemistry      Component Value Date/Time   NA 139 10/28/2017 0857   NA 137 08/05/2017 0839   K 3.9 10/28/2017 0857   K 3.9 08/05/2017 0839   CL 110 (H) 10/28/2017 0857   CL 106 02/01/2013 1120   CO2 24 10/28/2017 0857   CO2 23 08/05/2017 0839   BUN 12 10/28/2017 0857   BUN 12.2 08/05/2017 0839   CREATININE 0.89 10/28/2017 0857   CREATININE 0.9 08/05/2017 0839      Component Value Date/Time   CALCIUM 8.8 10/28/2017 0857   CALCIUM 8.6 08/05/2017 0839   ALKPHOS 96 10/28/2017 0857   ALKPHOS 146 08/05/2017 0839   AST 9 10/28/2017 0857   AST 7 08/05/2017 0839   ALT 6 10/28/2017 0857   ALT <6 08/05/2017 0839   BILITOT 0.3 10/28/2017 0857   BILITOT 0.46 08/05/2017 0839      Results for Alec Harris (MRN [id number]) as of 11/18/2017 08:48  Ref. Range 09/16/2017 09:01 10/07/2017 08:35 10/28/2017 08:57  Prostate Specific Ag, Serum Latest Ref  Range: 0.0 - 4.0 ng/mL 10.2 (H) 7.9 (H) 4.7 (H)      Impression and Plan:   72 year old man with  1. Castration resistant prostate cancer with disease to the bone.  He also has lymphadenopathy involvement.    He is currently on Taxotere chemotherapy and has received 8 cycles of therapy with excellent response.  His PSA continues to show reasonable decline and currently at 4.7.  Risks and benefits of continuing chemotherapy was reviewed today.  The rationale for completing 10 cycles of Taxotere or beyond was reviewed.  Given his excellent response to therapy he is agreeable to proceed at this time and will evaluate him in 3 weeks for cycle 10.  2. Androgen deprivation.  He continues to receive Lupron at [facility] urology.  I recommended continuing this indefinitely.  3. Bone directed therapy.  He will obtain dental  clearance in the near future for potential Xgeva.  He is at risk for a skeletal related events and external offer protection.  4. Anemia: Hemoglobin relatively stable at this time.  His anemia is related to malignancy and chronic disease.  5. Bone pain: Resolved at this time and does not require any pain medication.  6. IV access: Port-A-Cath does not cause any problems for him.  No difficulties in use.  7. Antiemetics: Compazine is available to him.  No nausea or vomiting reported.  8. Neutropenia prophylaxis: He will receive growth factor support after each cycle.  9. Followup.  In 3 weeks for his next cycle of chemotherapy.   25 minutes was spent face-to-face with the patient today.  More than 50% was spent in counseling, education and answering questions regarding his future plan of care.   Zola Button, MD 4/3/20198:46 AM

## 2017-11-19 ENCOUNTER — Inpatient Hospital Stay: Payer: Medicare Other

## 2017-11-19 DIAGNOSIS — C779 Secondary and unspecified malignant neoplasm of lymph node, unspecified: Secondary | ICD-10-CM | POA: Diagnosis not present

## 2017-11-19 DIAGNOSIS — D630 Anemia in neoplastic disease: Secondary | ICD-10-CM | POA: Diagnosis not present

## 2017-11-19 DIAGNOSIS — C61 Malignant neoplasm of prostate: Secondary | ICD-10-CM

## 2017-11-19 DIAGNOSIS — Z7689 Persons encountering health services in other specified circumstances: Secondary | ICD-10-CM | POA: Diagnosis not present

## 2017-11-19 DIAGNOSIS — C7951 Secondary malignant neoplasm of bone: Secondary | ICD-10-CM | POA: Diagnosis not present

## 2017-11-19 DIAGNOSIS — Z5111 Encounter for antineoplastic chemotherapy: Secondary | ICD-10-CM | POA: Diagnosis not present

## 2017-11-19 LAB — PROSTATE-SPECIFIC AG, SERUM (LABCORP): PROSTATE SPECIFIC AG, SERUM: 4.1 ng/mL — AB (ref 0.0–4.0)

## 2017-11-19 MED ORDER — PEGFILGRASTIM INJECTION 6 MG/0.6ML ~~LOC~~
6.0000 mg | PREFILLED_SYRINGE | Freq: Once | SUBCUTANEOUS | Status: AC
  Administered 2017-11-19: 6 mg via SUBCUTANEOUS

## 2017-11-19 MED ORDER — PEGFILGRASTIM INJECTION 6 MG/0.6ML ~~LOC~~
PREFILLED_SYRINGE | SUBCUTANEOUS | Status: AC
  Filled 2017-11-19: qty 0.6

## 2017-11-19 NOTE — Patient Instructions (Signed)
Pegfilgrastim injection What is this medicine? PEGFILGRASTIM (PEG fil gra stim) is a long-acting granulocyte colony-stimulating factor that stimulates the growth of neutrophils, a type of white blood cell important in the body's fight against infection. It is used to reduce the incidence of fever and infection in patients with certain types of cancer who are receiving chemotherapy that affects the bone marrow, and to increase survival after being exposed to high doses of radiation. This medicine may be used for other purposes; ask your health care provider or pharmacist if you have questions. COMMON BRAND NAME(S): Neulasta What should I tell my health care provider before I take this medicine? They need to know if you have any of these conditions: -kidney disease -latex allergy -ongoing radiation therapy -sickle cell disease -skin reactions to acrylic adhesives (On-Body Injector only) -an unusual or allergic reaction to pegfilgrastim, filgrastim, other medicines, foods, dyes, or preservatives -pregnant or trying to get pregnant -breast-feeding How should I use this medicine? This medicine is for injection under the skin. If you get this medicine at home, you will be taught how to prepare and give the pre-filled syringe or how to use the On-body Injector. Refer to the patient Instructions for Use for detailed instructions. Use exactly as directed. Tell your healthcare provider immediately if you suspect that the On-body Injector may not have performed as intended or if you suspect the use of the On-body Injector resulted in a missed or partial dose. It is important that you put your used needles and syringes in a special sharps container. Do not put them in a trash can. If you do not have a sharps container, call your pharmacist or healthcare provider to get one. Talk to your pediatrician regarding the use of this medicine in children. While this drug may be prescribed for selected conditions,  precautions do apply. Overdosage: If you think you have taken too much of this medicine contact a poison control center or emergency room at once. NOTE: This medicine is only for you. Do not share this medicine with others. What if I miss a dose? It is important not to miss your dose. Call your doctor or health care professional if you miss your dose. If you miss a dose due to an On-body Injector failure or leakage, a new dose should be administered as soon as possible using a single prefilled syringe for manual use. What may interact with this medicine? Interactions have not been studied. Give your health care provider a list of all the medicines, herbs, non-prescription drugs, or dietary supplements you use. Also tell them if you smoke, drink alcohol, or use illegal drugs. Some items may interact with your medicine. This list may not describe all possible interactions. Give your health care provider a list of all the medicines, herbs, non-prescription drugs, or dietary supplements you use. Also tell them if you smoke, drink alcohol, or use illegal drugs. Some items may interact with your medicine. What should I watch for while using this medicine? You may need blood work done while you are taking this medicine. If you are going to need a MRI, CT scan, or other procedure, tell your doctor that you are using this medicine (On-Body Injector only). What side effects may I notice from receiving this medicine? Side effects that you should report to your doctor or health care professional as soon as possible: -allergic reactions like skin rash, itching or hives, swelling of the face, lips, or tongue -dizziness -fever -pain, redness, or irritation at site   where injected -pinpoint red spots on the skin -red or dark-brown urine -shortness of breath or breathing problems -stomach or side pain, or pain at the shoulder -swelling -tiredness -trouble passing urine or change in the amount of urine Side  effects that usually do not require medical attention (report to your doctor or health care professional if they continue or are bothersome): -bone pain -muscle pain This list may not describe all possible side effects. Call your doctor for medical advice about side effects. You may report side effects to FDA at 1-800-FDA-1088. Where should I keep my medicine? Keep out of the reach of children. Store pre-filled syringes in a refrigerator between 2 and 8 degrees C (36 and 46 degrees F). Do not freeze. Keep in carton to protect from light. Throw away this medicine if it is left out of the refrigerator for more than 48 hours. Throw away any unused medicine after the expiration date. NOTE: This sheet is a summary. It may not cover all possible information. If you have questions about this medicine, talk to your doctor, pharmacist, or health care provider.  2018 Elsevier/Gold Standard (2016-07-31 12:58:03)  

## 2017-12-09 ENCOUNTER — Inpatient Hospital Stay: Payer: Medicare Other

## 2017-12-09 ENCOUNTER — Telehealth: Payer: Self-pay

## 2017-12-09 ENCOUNTER — Inpatient Hospital Stay (HOSPITAL_BASED_OUTPATIENT_CLINIC_OR_DEPARTMENT_OTHER): Payer: Medicare Other | Admitting: Oncology

## 2017-12-09 VITALS — BP 126/53 | HR 84 | Temp 97.8°F | Resp 16 | Ht 69.0 in | Wt 179.7 lb

## 2017-12-09 DIAGNOSIS — C61 Malignant neoplasm of prostate: Secondary | ICD-10-CM

## 2017-12-09 DIAGNOSIS — C779 Secondary and unspecified malignant neoplasm of lymph node, unspecified: Secondary | ICD-10-CM | POA: Diagnosis not present

## 2017-12-09 DIAGNOSIS — Z5111 Encounter for antineoplastic chemotherapy: Secondary | ICD-10-CM | POA: Diagnosis not present

## 2017-12-09 DIAGNOSIS — Z79899 Other long term (current) drug therapy: Secondary | ICD-10-CM | POA: Diagnosis not present

## 2017-12-09 DIAGNOSIS — Z95828 Presence of other vascular implants and grafts: Secondary | ICD-10-CM

## 2017-12-09 DIAGNOSIS — D630 Anemia in neoplastic disease: Secondary | ICD-10-CM | POA: Diagnosis not present

## 2017-12-09 DIAGNOSIS — R530 Neoplastic (malignant) related fatigue: Secondary | ICD-10-CM

## 2017-12-09 DIAGNOSIS — C7951 Secondary malignant neoplasm of bone: Secondary | ICD-10-CM | POA: Diagnosis not present

## 2017-12-09 DIAGNOSIS — Z7689 Persons encountering health services in other specified circumstances: Secondary | ICD-10-CM | POA: Diagnosis not present

## 2017-12-09 LAB — COMPREHENSIVE METABOLIC PANEL
ALBUMIN: 3.2 g/dL — AB (ref 3.5–5.0)
ALT: 6 U/L (ref 0–55)
AST: 6 U/L (ref 5–34)
Alkaline Phosphatase: 80 U/L (ref 40–150)
Anion gap: 8 (ref 3–11)
BUN: 12 mg/dL (ref 7–26)
CHLORIDE: 111 mmol/L — AB (ref 98–109)
CO2: 20 mmol/L — AB (ref 22–29)
Calcium: 8.8 mg/dL (ref 8.4–10.4)
Creatinine, Ser: 0.95 mg/dL (ref 0.70–1.30)
GFR calc Af Amer: >60 mL/min (ref >60)
GLUCOSE: 159 mg/dL — AB (ref 70–140)
POTASSIUM: 3.8 mmol/L (ref 3.5–5.1)
SODIUM: 139 mmol/L (ref 136–145)
Total Bilirubin: 0.5 mg/dL (ref 0.2–1.2)
Total Protein: 6.3 g/dL — ABNORMAL LOW (ref 6.4–8.3)

## 2017-12-09 LAB — CBC WITH DIFFERENTIAL/PLATELET
BASOS ABS: 0.1 10*3/uL (ref 0.0–0.1)
BASOS PCT: 1 %
EOS ABS: 0 10*3/uL (ref 0.0–0.5)
EOS PCT: 1 %
HCT: 35.4 % — ABNORMAL LOW (ref 38.4–49.9)
Hemoglobin: 11.3 g/dL — ABNORMAL LOW (ref 13.0–17.1)
Lymphocytes Relative: 32 %
Lymphs Abs: 2.1 10*3/uL (ref 0.9–3.3)
MCH: 28 pg (ref 27.2–33.4)
MCHC: 31.9 g/dL — ABNORMAL LOW (ref 32.0–36.0)
MCV: 87.6 fL (ref 79.3–98.0)
MONO ABS: 0.7 10*3/uL (ref 0.1–0.9)
Monocytes Relative: 11 %
Neutro Abs: 3.7 10*3/uL (ref 1.5–6.5)
Neutrophils Relative %: 55 %
PLATELETS: 243 10*3/uL (ref 140–400)
RBC: 4.04 MIL/uL — AB (ref 4.20–5.82)
RDW: 18.9 % — AB (ref 11.0–14.6)
WBC: 6.6 10*3/uL (ref 4.0–10.3)

## 2017-12-09 MED ORDER — SODIUM CHLORIDE 0.9 % IV SOLN
Freq: Once | INTRAVENOUS | Status: AC
  Administered 2017-12-09: 12:00:00 via INTRAVENOUS

## 2017-12-09 MED ORDER — SODIUM CHLORIDE 0.9% FLUSH
10.0000 mL | INTRAVENOUS | Status: DC | PRN
  Administered 2017-12-09: 10 mL
  Filled 2017-12-09: qty 10

## 2017-12-09 MED ORDER — SODIUM CHLORIDE 0.9% FLUSH
10.0000 mL | INTRAVENOUS | Status: DC | PRN
  Administered 2017-12-09: 10 mL via INTRAVENOUS
  Filled 2017-12-09: qty 10

## 2017-12-09 MED ORDER — HEPARIN SOD (PORK) LOCK FLUSH 100 UNIT/ML IV SOLN
500.0000 [IU] | Freq: Once | INTRAVENOUS | Status: AC | PRN
  Administered 2017-12-09: 500 [IU]
  Filled 2017-12-09: qty 5

## 2017-12-09 MED ORDER — DEXAMETHASONE SODIUM PHOSPHATE 10 MG/ML IJ SOLN
INTRAMUSCULAR | Status: AC
  Filled 2017-12-09: qty 1

## 2017-12-09 MED ORDER — SODIUM CHLORIDE 0.9 % IV SOLN
60.0000 mg/m2 | Freq: Once | INTRAVENOUS | Status: AC
  Administered 2017-12-09: 120 mg via INTRAVENOUS
  Filled 2017-12-09: qty 12

## 2017-12-09 MED ORDER — DEXAMETHASONE SODIUM PHOSPHATE 10 MG/ML IJ SOLN
10.0000 mg | Freq: Once | INTRAMUSCULAR | Status: AC
  Administered 2017-12-09: 10 mg via INTRAVENOUS

## 2017-12-09 NOTE — Patient Instructions (Signed)
[facility] Discharge Instructions for Patients Receiving Chemotherapy  Today you received the following chemotherapy agents Taxotere To help prevent nausea and vomiting after your treatment, we encourage you to take your nausea medication as prescribed.   If you develop nausea and vomiting that is not controlled by your nausea medication, call the clinic.   BELOW ARE SYMPTOMS THAT SHOULD BE REPORTED IMMEDIATELY:  *FEVER GREATER THAN 100.5 F  *CHILLS WITH OR WITHOUT FEVER  NAUSEA AND VOMITING THAT IS NOT CONTROLLED WITH YOUR NAUSEA MEDICATION  *UNUSUAL SHORTNESS OF BREATH  *UNUSUAL BRUISING OR BLEEDING  TENDERNESS IN MOUTH AND THROAT WITH OR WITHOUT PRESENCE OF ULCERS  *URINARY PROBLEMS  *BOWEL PROBLEMS  UNUSUAL RASH Items with * indicate a potential emergency and should be followed up as soon as possible.  Feel free to call the clinic should you have any questions or concerns. The clinic phone number is [phone].  Please show the CHEMO ALERT CARD at check-in to the Emergency Department and triage nurse.   

## 2017-12-09 NOTE — Progress Notes (Signed)
Hematology and Oncology Follow Up Visit  Alec Harris [id number] [birth year] 72 y.o. 12/09/2017 9:31 AM Patient, No Pcp PerShadad, [name], MD   Principle Diagnosis: 24 year old man with castration resistant metastatic prostate cancer with disease to the bone.  He was initially diagnosed with Gleason score 4+4 = 8 in 2003.   Prior Therapy:  He underwent a prostatectomy with a pathology that showed prostatic adenocarcinoma. Gleason score 4+4 equals 8, with the tumor involving the margins and the pathologic staging was pT4 N1 with 1 of 2 left pelvic lymph nodes involved.   He was started on adjuvant hormone therapy with Lupron. He developed a rising PSA and bone metastasis.   He was then treated with ketoconazole and prednisone. He developed a rising PSA with a doubling time of just over 6 months. Bone scan in September 2013 showed increased uptake in the right lower lumbar spine at L4 and L5 and abnormal uptake in the mid sternum which had progressed since the prior bone scan.  Zytiga 1000 mg daily started in October 2013.  Discontinued in October 2018 after progression of disease.   Current therapy:  Taxotere chemotherapy at 75 mg per meter square started on 06/02/2017.  The dose was reduced to 60 mg/m starting with cycle 3.  He is here for cycle 10 of therapy.  Lupron given at at [facility] urology.  Interim History: Alec Harris is here for a follow-up visit.  He reports no major changes in his health since the last chemotherapy treatment.  He does report grade 1 fatigue associated with chemotherapy that at times can be a grade 2 but improves in the last week or so.  His appetite has been reasonable and his performance status is not dramatically changed.  He continues to attend to activities of daily living.  He denies any infusion related complications of peripheral neuropathy.  He denies any diarrhea but does report constipation.  He is no longer reporting any bone pain.   He denies any  headaches blurred vision or double vision.  He denies any peripheral neuropathy or alteration of mental status.  He denies any fevers, chills or sweats.  He denied any chest pain, palpitation or leg edema.  He denies any cough, wheezing or hemoptysis.  He denies any nausea, vomiting or abdominal pain.  Does not report any frequency urgency or hesitancy. Does not report any rash or lesions. Does not report any petechiae.  He does not report any arthralgias or myalgias or joint swelling.  He does not report any anxiety or depression. Remainder of his review of systems is negative.   Medications: I have reviewed the patient's current medications.  Unchanged from my review. Current Outpatient Medications  Medication Sig Dispense Refill  . amLODipine (NORVASC) 5 MG tablet TAKE 1 TABLET (5 MG TOTAL) BY MOUTH DAILY. 90 tablet 3  . aspirin EC 81 MG tablet Take 81 mg by mouth daily.    [name] atorvastatin (LIPITOR) 20 MG tablet TAKE 1 TABLET BY MOUTH EVERY DAY 90 tablet 1  . Calcium Carb-Cholecalciferol (CALCIUM 500 +D) 500-400 MG-UNIT TABS Take 1 tablet by mouth 2 (two) times daily.    . cilostazol (PLETAL) 100 MG tablet TAKE 1 TABLET (100 MG TOTAL) BY MOUTH 2 (TWO) TIMES DAILY. 60 tablet 11  . lidocaine-prilocaine (EMLA) cream Apply 1 application topically as needed. 30 g 0  . prochlorperazine (COMPAZINE) 10 MG tablet Take 1 tablet (10 mg total) by mouth every 6 (six) hours as needed for nausea  or vomiting. 30 tablet 0  . rosuvastatin (CRESTOR) 10 MG tablet TAKE 1 TABLET (10 MG TOTAL) BY MOUTH DAILY. 30 tablet 6  . traMADol (ULTRAM) 50 MG tablet Take 1 tablet (50 mg total) by mouth every 6 (six) hours as needed. (Patient not taking: Reported on 10/07/2017) 30 tablet 1   No current facility-administered medications for this visit.    Facility-Administered Medications Ordered in Other Visits  Medication Dose Route Frequency Provider Last Rate Last Dose  . sodium chloride flush (NS) 0.9 % injection 10 mL  10 mL  Intravenous PRN [name], MD   10 mL at 12/09/17 1027   His past medical history, family history and social history remain unchanged.  Allergies: No Known Allergies   Physical Exam:  Blood pressure (!) 126/53, pulse 84, temperature 97.8 F (36.6 C), temperature source Oral, resp. rate 16, height 5\' 9"  (1.753 m), weight 179 lb 11.2 oz (81.5 kg), SpO2 98 %.    ECOG: 1 General appearance: Well-appearing gentleman appeared comfortable. Head: Atraumatic without abnormalities. Oral mucosa: Mucous membranes are moist and pink. Eyes: Pupils are equal and round reactive to light. Lymph nodes: No lymphadenopathy palpated in the cervical, axillary and supraclavicular areas. Heart: Regular rate and rhythm without any murmurs. Lung: Clear without any wheezes, rhonchi or dullness to percussion. Abdomen: Soft, good bowel sounds without any rebound or guarding. Musculoskeletal: No clubbing or cyanosis.  No joint deformity or effusion. Neurological examination: No motor or sensory deficits. Skin: No ecchymosis or petechiae.   Lab Results: Lab Results  Component Value Date   WBC 5.6 11/18/2017   HGB 10.6 (L) 11/18/2017   HCT 33.1 (L) 11/18/2017   MCV 89.2 11/18/2017   PLT 252 11/18/2017     Chemistry      Component Value Date/Time   NA 138 11/18/2017 0827   NA 137 08/05/2017 0839   K 3.9 11/18/2017 0827   K 3.9 08/05/2017 0839   CL 111 (H) 11/18/2017 0827   CL 106 02/01/2013 1120   CO2 22 11/18/2017 0827   CO2 23 08/05/2017 0839   BUN 11 11/18/2017 0827   BUN 12.2 08/05/2017 0839   CREATININE 0.83 11/18/2017 0827   CREATININE 0.9 08/05/2017 0839      Component Value Date/Time   CALCIUM 8.6 11/18/2017 0827   CALCIUM 8.6 08/05/2017 0839   ALKPHOS 76 11/18/2017 0827   ALKPHOS 146 08/05/2017 0839   AST 8 11/18/2017 0827   AST 7 08/05/2017 0839   ALT 7 11/18/2017 0827   ALT <6 08/05/2017 0839   BILITOT 0.4 11/18/2017 0827   BILITOT 0.46 08/05/2017 0839         Results for Alec Harris, Alec Harris (MRN [id number]) as of 12/09/2017 09:32  Ref. Range 10/07/2017 08:35 10/28/2017 08:57 11/18/2017 08:27  Prostate Specific Ag, Serum Latest Ref Range: 0.0 - 4.0 ng/mL 7.9 (H) 4.7 (H) 4.1 (H)     Impression and Plan:   72 year old man with  1. Castration-resistant prostate cancer with lymphadenopathy and bone disease.  He is currently on Taxotere chemotherapy without complications.  He continues to experience excellent response to therapy and currently PSA of 4.1.  He is scheduled to receive his 10th cycle of chemotherapy today.  The long-term complication associated with chemotherapy was discussed today as well as the rationale of continuing chemotherapy versus discontinuation.  After discussion today, he opted to proceed with a treatment break and potentially resume chemotherapy in the future.  He feels that fatigue may be slightly  excessive as of late and would prefer a treatment holiday given his excellent response and PSA.  2. Androgen deprivation.  I recommended continuing this medication indefinitely.  3. Bone directed therapy.  He would benefit from [place] once he obtains dental clearance.  This has been deferred for the time being.  4. Anemia: Hemoglobin Is improved at this time.  His anemia is related to malignancy and no growth factor is needed.  5. Bone pain: No bone pain reported since last visit.  6. IV access: Port-A-Cath remains in use without any recent complications.  7. Antiemetics: No nausea or vomiting reported at this time.  8. Neutropenia prophylaxis: I recommended continuing growth factor support after each cycle of chemotherapy.  9. Followup.  In 6 weeks to follow his progress.  25 minutes was spent face-to-face with the patient today.  More than 50% was spent in counseling, education and coordinating his multifaceted care.  Zola Button, MD 4/24/20199:31 AM

## 2017-12-09 NOTE — Telephone Encounter (Signed)
Printed avs and calender of upcoming appointment. Per 4/24 los 

## 2017-12-10 ENCOUNTER — Inpatient Hospital Stay: Payer: Medicare Other

## 2017-12-10 ENCOUNTER — Telehealth: Payer: Self-pay | Admitting: *Deleted

## 2017-12-10 VITALS — BP 127/60 | HR 78 | Temp 98.0°F | Resp 16

## 2017-12-10 DIAGNOSIS — D630 Anemia in neoplastic disease: Secondary | ICD-10-CM | POA: Diagnosis not present

## 2017-12-10 DIAGNOSIS — Z5111 Encounter for antineoplastic chemotherapy: Secondary | ICD-10-CM | POA: Diagnosis not present

## 2017-12-10 DIAGNOSIS — C7951 Secondary malignant neoplasm of bone: Secondary | ICD-10-CM | POA: Diagnosis not present

## 2017-12-10 DIAGNOSIS — C61 Malignant neoplasm of prostate: Secondary | ICD-10-CM | POA: Diagnosis not present

## 2017-12-10 DIAGNOSIS — Z7689 Persons encountering health services in other specified circumstances: Secondary | ICD-10-CM | POA: Diagnosis not present

## 2017-12-10 DIAGNOSIS — C779 Secondary and unspecified malignant neoplasm of lymph node, unspecified: Secondary | ICD-10-CM | POA: Diagnosis not present

## 2017-12-10 LAB — PROSTATE-SPECIFIC AG, SERUM (LABCORP): Prostate Specific Ag, Serum: 3.2 ng/mL (ref 0.0–4.0)

## 2017-12-10 MED ORDER — PEGFILGRASTIM INJECTION 6 MG/0.6ML ~~LOC~~
6.0000 mg | PREFILLED_SYRINGE | Freq: Once | SUBCUTANEOUS | Status: AC
  Administered 2017-12-10: 6 mg via SUBCUTANEOUS

## 2017-12-10 MED ORDER — PEGFILGRASTIM INJECTION 6 MG/0.6ML ~~LOC~~
PREFILLED_SYRINGE | SUBCUTANEOUS | Status: AC
  Filled 2017-12-10: qty 0.6

## 2017-12-10 NOTE — Telephone Encounter (Signed)
-----   Message from [name], MD sent at 12/10/2017  8:21 AM EDT ----- Please let him know his PSA is lower

## 2017-12-10 NOTE — Telephone Encounter (Signed)
As noted below by Dr. [name], I informed patient of his PSA level. He verbalized understanding.  

## 2017-12-10 NOTE — Patient Instructions (Signed)
Pegfilgrastim injection What is this medicine? PEGFILGRASTIM (PEG fil gra stim) is a long-acting granulocyte colony-stimulating factor that stimulates the growth of neutrophils, a type of white blood cell important in the body's fight against infection. It is used to reduce the incidence of fever and infection in patients with certain types of cancer who are receiving chemotherapy that affects the bone marrow, and to increase survival after being exposed to high doses of radiation. This medicine may be used for other purposes; ask your health care provider or pharmacist if you have questions. COMMON BRAND NAME(S): Neulasta What should I tell my health care provider before I take this medicine? They need to know if you have any of these conditions: -kidney disease -latex allergy -ongoing radiation therapy -sickle cell disease -skin reactions to acrylic adhesives (On-Body Injector only) -an unusual or allergic reaction to pegfilgrastim, filgrastim, other medicines, foods, dyes, or preservatives -pregnant or trying to get pregnant -breast-feeding How should I use this medicine? This medicine is for injection under the skin. If you get this medicine at home, you will be taught how to prepare and give the pre-filled syringe or how to use the On-body Injector. Refer to the patient Instructions for Use for detailed instructions. Use exactly as directed. Tell your healthcare provider immediately if you suspect that the On-body Injector may not have performed as intended or if you suspect the use of the On-body Injector resulted in a missed or partial dose. It is important that you put your used needles and syringes in a special sharps container. Do not put them in a trash can. If you do not have a sharps container, call your pharmacist or healthcare provider to get one. Talk to your pediatrician regarding the use of this medicine in children. While this drug may be prescribed for selected conditions,  precautions do apply. Overdosage: If you think you have taken too much of this medicine contact a poison control center or emergency room at once. NOTE: This medicine is only for you. Do not share this medicine with others. What if I miss a dose? It is important not to miss your dose. Call your doctor or health care professional if you miss your dose. If you miss a dose due to an On-body Injector failure or leakage, a new dose should be administered as soon as possible using a single prefilled syringe for manual use. What may interact with this medicine? Interactions have not been studied. Give your health care provider a list of all the medicines, herbs, non-prescription drugs, or dietary supplements you use. Also tell them if you smoke, drink alcohol, or use illegal drugs. Some items may interact with your medicine. This list may not describe all possible interactions. Give your health care provider a list of all the medicines, herbs, non-prescription drugs, or dietary supplements you use. Also tell them if you smoke, drink alcohol, or use illegal drugs. Some items may interact with your medicine. What should I watch for while using this medicine? You may need blood work done while you are taking this medicine. If you are going to need a MRI, CT scan, or other procedure, tell your doctor that you are using this medicine (On-Body Injector only). What side effects may I notice from receiving this medicine? Side effects that you should report to your doctor or health care professional as soon as possible: -allergic reactions like skin rash, itching or hives, swelling of the face, lips, or tongue -dizziness -fever -pain, redness, or irritation at site   where injected -pinpoint red spots on the skin -red or dark-brown urine -shortness of breath or breathing problems -stomach or side pain, or pain at the shoulder -swelling -tiredness -trouble passing urine or change in the amount of urine Side  effects that usually do not require medical attention (report to your doctor or health care professional if they continue or are bothersome): -bone pain -muscle pain This list may not describe all possible side effects. Call your doctor for medical advice about side effects. You may report side effects to FDA at 1-800-FDA-1088. Where should I keep my medicine? Keep out of the reach of children. Store pre-filled syringes in a refrigerator between 2 and 8 degrees C (36 and 46 degrees F). Do not freeze. Keep in carton to protect from light. Throw away this medicine if it is left out of the refrigerator for more than 48 hours. Throw away any unused medicine after the expiration date. NOTE: This sheet is a summary. It may not cover all possible information. If you have questions about this medicine, talk to your doctor, pharmacist, or health care provider.  2018 Elsevier/Gold Standard (2016-07-31 12:58:03)  

## 2017-12-30 ENCOUNTER — Ambulatory Visit: Payer: Medicare Other

## 2017-12-30 ENCOUNTER — Other Ambulatory Visit: Payer: Medicare Other

## 2017-12-30 ENCOUNTER — Ambulatory Visit: Payer: Medicare Other | Admitting: Oncology

## 2017-12-31 ENCOUNTER — Ambulatory Visit: Payer: Medicare Other

## 2018-01-10 ENCOUNTER — Other Ambulatory Visit: Payer: Self-pay | Admitting: Cardiovascular Disease

## 2018-01-12 NOTE — Telephone Encounter (Signed)
Rx sent to pharmacy   

## 2018-01-13 ENCOUNTER — Other Ambulatory Visit: Payer: Self-pay | Admitting: Cardiovascular Disease

## 2018-01-13 NOTE — Telephone Encounter (Signed)
Rx sent to pharmacy   

## 2018-01-19 ENCOUNTER — Inpatient Hospital Stay: Payer: Medicare Other | Attending: Oncology

## 2018-01-19 ENCOUNTER — Inpatient Hospital Stay: Payer: Medicare Other

## 2018-01-19 DIAGNOSIS — Z95828 Presence of other vascular implants and grafts: Secondary | ICD-10-CM

## 2018-01-19 DIAGNOSIS — Z79818 Long term (current) use of other agents affecting estrogen receptors and estrogen levels: Secondary | ICD-10-CM | POA: Diagnosis not present

## 2018-01-19 DIAGNOSIS — Z79899 Other long term (current) drug therapy: Secondary | ICD-10-CM | POA: Diagnosis not present

## 2018-01-19 DIAGNOSIS — C7951 Secondary malignant neoplasm of bone: Secondary | ICD-10-CM | POA: Diagnosis not present

## 2018-01-19 DIAGNOSIS — C61 Malignant neoplasm of prostate: Secondary | ICD-10-CM | POA: Insufficient documentation

## 2018-01-19 DIAGNOSIS — D649 Anemia, unspecified: Secondary | ICD-10-CM | POA: Diagnosis not present

## 2018-01-19 DIAGNOSIS — Z7982 Long term (current) use of aspirin: Secondary | ICD-10-CM | POA: Diagnosis not present

## 2018-01-19 LAB — COMPREHENSIVE METABOLIC PANEL
ALT: <6 U/L (ref 0–55)
ANION GAP: 6 (ref 3–11)
AST: 10 U/L (ref 5–34)
Albumin: 3.4 g/dL — ABNORMAL LOW (ref 3.5–5.0)
Alkaline Phosphatase: 87 U/L (ref 40–150)
BILIRUBIN TOTAL: 0.6 mg/dL (ref 0.2–1.2)
BUN: 16 mg/dL (ref 7–26)
CHLORIDE: 108 mmol/L (ref 98–109)
CO2: 25 mmol/L (ref 22–29)
Calcium: 9.1 mg/dL (ref 8.4–10.4)
Creatinine, Ser: 0.86 mg/dL (ref 0.70–1.30)
Glucose, Bld: 107 mg/dL (ref 70–140)
POTASSIUM: 4.4 mmol/L (ref 3.5–5.1)
Sodium: 139 mmol/L (ref 136–145)
TOTAL PROTEIN: 6.9 g/dL (ref 6.4–8.3)

## 2018-01-19 LAB — CBC WITH DIFFERENTIAL/PLATELET
BASOS ABS: 0 10*3/uL (ref 0.0–0.1)
Basophils Relative: 0 %
Eosinophils Absolute: 0.2 10*3/uL (ref 0.0–0.5)
Eosinophils Relative: 3 %
HCT: 38.2 % — ABNORMAL LOW (ref 38.4–49.9)
HEMOGLOBIN: 12.3 g/dL — AB (ref 13.0–17.1)
LYMPHS ABS: 2.3 10*3/uL (ref 0.9–3.3)
LYMPHS PCT: 35 %
MCH: 28.5 pg (ref 27.2–33.4)
MCHC: 32.2 g/dL (ref 32.0–36.0)
MCV: 88.6 fL (ref 79.3–98.0)
Monocytes Absolute: 0.6 10*3/uL (ref 0.1–0.9)
Monocytes Relative: 9 %
NEUTROS PCT: 53 %
Neutro Abs: 3.5 10*3/uL (ref 1.5–6.5)
PLATELETS: 204 10*3/uL (ref 140–400)
RBC: 4.31 MIL/uL (ref 4.20–5.82)
RDW: 19.1 % — ABNORMAL HIGH (ref 11.0–14.6)
WBC: 6.5 10*3/uL (ref 4.0–10.3)

## 2018-01-19 MED ORDER — SODIUM CHLORIDE 0.9% FLUSH
10.0000 mL | INTRAVENOUS | Status: DC | PRN
  Administered 2018-01-19: 10 mL via INTRAVENOUS
  Filled 2018-01-19: qty 10

## 2018-01-19 MED ORDER — HEPARIN SOD (PORK) LOCK FLUSH 100 UNIT/ML IV SOLN
500.0000 [IU] | Freq: Once | INTRAVENOUS | Status: AC | PRN
  Administered 2018-01-19: 500 [IU] via INTRAVENOUS
  Filled 2018-01-19: qty 5

## 2018-01-19 NOTE — Patient Instructions (Signed)
Implanted Port Home Guide An implanted port is a type of central line that is placed under the skin. Central lines are used to provide IV access when treatment or nutrition needs to be given through a person's veins. Implanted ports are used for long-term IV access. An implanted port may be placed because:  You need IV medicine that would be irritating to the small veins in your hands or arms.  You need long-term IV medicines, such as antibiotics.  You need IV nutrition for a long period.  You need frequent blood draws for lab tests.  You need dialysis.  Implanted ports are usually placed in the chest area, but they can also be placed in the upper arm, the abdomen, or the leg. An implanted port has two main parts:  Reservoir. The reservoir is round and will appear as a small, raised area under your skin. The reservoir is the part where a needle is inserted to give medicines or draw blood.  Catheter. The catheter is a thin, flexible tube that extends from the reservoir. The catheter is placed into a large vein. Medicine that is inserted into the reservoir goes into the catheter and then into the vein.  How will I care for my incision site? Do not get the incision site wet. Bathe or shower as directed by your health care provider. How is my port accessed? Special steps must be taken to access the port:  Before the port is accessed, a numbing cream can be placed on the skin. This helps numb the skin over the port site.  Your health care provider uses a sterile technique to access the port. ? Your health care provider must put on a mask and sterile gloves. ? The skin over your port is cleaned carefully with an antiseptic and allowed to dry. ? The port is gently pinched between sterile gloves, and a needle is inserted into the port.  Only "non-coring" port needles should be used to access the port. Once the port is accessed, a blood return should be checked. This helps ensure that the port  is in the vein and is not clogged.  If your port needs to remain accessed for a constant infusion, a clear (transparent) bandage will be placed over the needle site. The bandage and needle will need to be changed every week, or as directed by your health care provider.  Keep the bandage covering the needle clean and dry. Do not get it wet. Follow your health care provider's instructions on how to take a shower or bath while the port is accessed.  If your port does not need to stay accessed, no bandage is needed over the port.  What is flushing? Flushing helps keep the port from getting clogged. Follow your health care provider's instructions on how and when to flush the port. Ports are usually flushed with saline solution or a medicine called heparin. The need for flushing will depend on how the port is used.  If the port is used for intermittent medicines or blood draws, the port will need to be flushed: ? After medicines have been given. ? After blood has been drawn. ? As part of routine maintenance.  If a constant infusion is running, the port may not need to be flushed.  How long will my port stay implanted? The port can stay in for as long as your health care provider thinks it is needed. When it is time for the port to come out, surgery will be   done to remove it. The procedure is similar to the one performed when the port was put in. When should I seek immediate medical care? When you have an implanted port, you should seek immediate medical care if:  You notice a bad smell coming from the incision site.  You have swelling, redness, or drainage at the incision site.  You have more swelling or pain at the port site or the surrounding area.  You have a fever that is not controlled with medicine.  This information is not intended to replace advice given to you by your health care provider. Make sure you discuss any questions you have with your health care provider. Document  Released: 08/04/2005 Document Revised: 01/10/2016 Document Reviewed: 04/11/2013 Elsevier Interactive Patient Education  2017 [employer]  

## 2018-01-20 ENCOUNTER — Inpatient Hospital Stay (HOSPITAL_BASED_OUTPATIENT_CLINIC_OR_DEPARTMENT_OTHER): Payer: Medicare Other | Admitting: Oncology

## 2018-01-20 ENCOUNTER — Telehealth: Payer: Self-pay | Admitting: Oncology

## 2018-01-20 VITALS — BP 126/64 | HR 70 | Temp 98.5°F | Resp 17 | Ht 69.0 in | Wt 175.7 lb

## 2018-01-20 DIAGNOSIS — Z7982 Long term (current) use of aspirin: Secondary | ICD-10-CM | POA: Diagnosis not present

## 2018-01-20 DIAGNOSIS — Z7951 Long term (current) use of inhaled steroids: Secondary | ICD-10-CM

## 2018-01-20 DIAGNOSIS — C7951 Secondary malignant neoplasm of bone: Secondary | ICD-10-CM | POA: Diagnosis not present

## 2018-01-20 DIAGNOSIS — Z79818 Long term (current) use of other agents affecting estrogen receptors and estrogen levels: Secondary | ICD-10-CM | POA: Diagnosis not present

## 2018-01-20 DIAGNOSIS — D649 Anemia, unspecified: Secondary | ICD-10-CM | POA: Diagnosis not present

## 2018-01-20 DIAGNOSIS — Z79899 Other long term (current) drug therapy: Secondary | ICD-10-CM

## 2018-01-20 DIAGNOSIS — C61 Malignant neoplasm of prostate: Secondary | ICD-10-CM | POA: Diagnosis not present

## 2018-01-20 LAB — PROSTATE-SPECIFIC AG, SERUM (LABCORP): PROSTATE SPECIFIC AG, SERUM: 2.2 ng/mL (ref 0.0–4.0)

## 2018-01-20 NOTE — Progress Notes (Signed)
Hematology and Oncology Follow Up Visit  Alec Harris [id number] 1946-06-14 72 y.o. 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  He was initially diagnosed with prostate cancer in 2003 with a Gleason score 4+4 = .   Prior Therapy:  He underwent a prostatectomy with a pathology that showed prostatic adenocarcinoma. Gleason score 4+4 equals 8, with the tumor involving the margins and the pathologic staging was pT4 N1 with 1 of 2 left pelvic lymph nodes involved.   He was started on adjuvant hormone therapy with Lupron. He developed a rising PSA and bone metastasis.   He was then treated with ketoconazole and prednisone. He developed a rising PSA with a doubling time of just over 6 months. Bone scan in September 2013 showed increased uptake in the right lower lumbar spine at L4 and L5 and abnormal uptake in the mid sternum which had progressed since the prior bone scan.  Zytiga 1000 mg daily started in October 2013.  Discontinued in October 2018 after progression of disease.  Taxotere chemotherapy at 75 mg per meter square started on 06/02/2017.  The dose was reduced to 60 mg/m starting with cycle 3.  He completed 10 cycles of therapy and April 2019.  Current therapy:  Observation and surveillance.    Interim History: [name] resents today for a follow-up visit.  Since the last visit, he concluded chemotherapy without any major complications related to it.  He denies any nausea or excessive fatigue.  He denies any residual neuropathy.  He has regained all activities of daily living without any decline.  His performance status and activity level remain excellent.  He denies any bone pain or pathological fractures.  He denies any headaches blurred vision or double vision.  He denies any decline in his appetite or weight loss.  He denies any  fevers, chills or sweats.  He denied any chest pain, palpitation or leg edema.  He denies any cough, wheezing or hemoptysis.  He denies any nausea, vomiting or abdominal pain.  Does not report any frequency urgency or hesitancy. He does not report any arthralgias or myalgias or joint swelling.  He does not report skin rashes or lesions. Remainder of his review of systems is negative.   Medications: I have reviewed the patient's current medications.  Remain unchanged. Current Outpatient Medications  Medication Sig Dispense Refill  . amLODipine (NORVASC) 5 MG tablet TAKE 1 TABLET (5 MG TOTAL) BY MOUTH DAILY. 90 tablet 3  . aspirin EC 81 MG tablet Take 81 mg by mouth daily.    [name] atorvastatin (LIPITOR) 20 MG tablet Take 1 tablet (20 mg total) by mouth daily. Please call to make appointment for further refills 90 tablet 0  . Calcium Carb-Cholecalciferol (CALCIUM 500 +D) 500-400 MG-UNIT TABS Take 1 tablet by mouth 2 (two) times daily.    . cilostazol (PLETAL) 100 MG tablet Take 1 tablet (100 mg total) by mouth 2 (two) times daily. Please call office to make follow up appointment. 60 tablet 1  . lidocaine-prilocaine (EMLA) cream Apply 1 application topically as needed. 30 g 0  . prochlorperazine (COMPAZINE) 10 MG tablet Take 1 tablet (10 mg total) by mouth every 6 (six) hours as needed for nausea or vomiting. 30 tablet 0  . rosuvastatin (CRESTOR) 10 MG tablet TAKE 1 TABLET (10 MG TOTAL) BY MOUTH DAILY. 30 tablet 6  .  traMADol (ULTRAM) 50 MG tablet Take 1 tablet (50 mg total) by mouth every 6 (six) hours as needed. (Patient not taking: Reported on 10/07/2017) 30 tablet 1   No current facility-administered medications for this visit.    His past medical history, family history and social history remain unchanged.  Allergies: No Known Allergies   Physical Exam:  Blood pressure 126/64, pulse 70, temperature 98.5 F (36.9 C), temperature source Oral, resp. rate 17, height 5\' 9"  (1.753 m), weight 175 lb  11.2 oz (79.7 kg), SpO2 95 %.    ECOG: 1 General appearance: Alert, awake gentleman without distress. Head: Normal cephalic without normalities.. Oral mucosa: Mucous membranes are moist and pink. Eyes: Sclera anicteric. Lymph nodes: No cervical, axillary or supraclavicular areas. Heart: Regular rate without any murmurs or gallops.   Lung: Clear to auscultation without any rhonchi, wheezes or dullness to percussion. Abdomen: Soft, nontender without any rebound or guarding. Musculoskeletal: No clubbing or cyanosis. Neurological examination: No motor or sensory deficits. Skin: No skin rashes or lesions.   Lab Results: Lab Results  Component Value Date   WBC 6.5 01/19/2018   HGB 12.3 (L) 01/19/2018   HCT 38.2 (L) 01/19/2018   MCV 88.6 01/19/2018   PLT 204 01/19/2018     Chemistry      Component Value Date/Time   NA 139 01/19/2018 1018   NA 137 08/05/2017 0839   K 4.4 01/19/2018 1018   K 3.9 08/05/2017 0839   CL 108 01/19/2018 1018   CL 106 02/01/2013 1120   CO2 25 01/19/2018 1018   CO2 23 08/05/2017 0839   BUN 16 01/19/2018 1018   BUN 12.2 08/05/2017 0839   CREATININE 0.86 01/19/2018 1018   CREATININE 0.9 08/05/2017 0839      Component Value Date/Time   CALCIUM 9.1 01/19/2018 1018   CALCIUM 8.6 08/05/2017 0839   ALKPHOS 87 01/19/2018 1018   ALKPHOS 146 08/05/2017 0839   AST 10 01/19/2018 1018   AST 7 08/05/2017 0839   ALT <6 01/19/2018 1018   ALT <6 08/05/2017 0839   BILITOT 0.6 01/19/2018 1018   BILITOT 0.46 08/05/2017 0839      Results for IMANOL, BIHL (MRN [id number]) as of 01/20/2018 15:17  Ref. Range 12/09/2017 08:49 01/19/2018 10:18  Prostate Specific Ag, Serum Latest Ref Range: 0.0 - 4.0 ng/mL 3.2 2.2     Impression and Plan:   72 year old man with  1. Castration-resistant prostate cancer with documented disease of the bone and lymphadenopathy.  He completed 10 cycles of Taxotere in April 2019 with excellent PSA response.  His PSA on 01/19/2018 was  reviewed and discussed with the patient today and showed excellent response to therapy.  Options of therapy were reviewed today which include observation and surveillance versus restarting therapy in the immediate future.  At this time given his excellent PSA response and we have elected to proceed with active surveillance.  2. Androgen deprivation.  He will restart Lupron with the next visit.  He has been receiving at [facility] urology will receive it at the cancer center for better convenience.  3. Bone directed therapy.  He has deferred this option for the time being.  We will continue to address with him in the future.  4. Anemia: Hemoglobin has normalized after discontinuation of chemotherapy.  5. Bone pain: No bone pain noted at this time.  6. IV access: Port-A-Cath will be flushed every 8 weeks.  7. Followup.  In 8 weeks to follow his progress.  15 minutes was spent face-to-face with the patient today.  More than 50% was spent in counseling, education and answering questions regarding future plan of care.  Zola Button, MD 6/5/20193:24 PM

## 2018-01-20 NOTE — Telephone Encounter (Signed)
Scheduled appt per 6/5 los - gave patient aVS and calender per los.  

## 2018-01-22 ENCOUNTER — Other Ambulatory Visit: Payer: Self-pay | Admitting: Cardiovascular Disease

## 2018-02-05 ENCOUNTER — Ambulatory Visit (INDEPENDENT_AMBULATORY_CARE_PROVIDER_SITE_OTHER): Payer: Medicare Other | Admitting: Cardiovascular Disease

## 2018-02-05 ENCOUNTER — Encounter: Payer: Self-pay | Admitting: Cardiovascular Disease

## 2018-02-05 VITALS — BP 112/58 | HR 60 | Ht 69.0 in | Wt 177.0 lb

## 2018-02-05 DIAGNOSIS — I739 Peripheral vascular disease, unspecified: Secondary | ICD-10-CM | POA: Diagnosis not present

## 2018-02-05 DIAGNOSIS — E7800 Pure hypercholesterolemia, unspecified: Secondary | ICD-10-CM

## 2018-02-05 LAB — LIPID PANEL
CHOL/HDL RATIO: 2.7 ratio (ref 0.0–5.0)
CHOLESTEROL TOTAL: 137 mg/dL (ref 100–199)
HDL: 51 mg/dL (ref >39)
LDL CALC: 73 mg/dL (ref 0–99)
TRIGLYCERIDES: 66 mg/dL (ref 0–149)
VLDL Cholesterol Cal: 13 mg/dL (ref 5–40)

## 2018-02-05 LAB — HEPATIC FUNCTION PANEL
ALT: 8 IU/L (ref 0–44)
AST: 11 IU/L (ref 0–40)
Albumin: 3.9 g/dL (ref 3.5–4.8)
Alkaline Phosphatase: 86 IU/L (ref 39–117)
BILIRUBIN TOTAL: 0.4 mg/dL (ref 0.0–1.2)
Bilirubin, Direct: 0.12 mg/dL (ref 0.00–0.40)
Total Protein: 6.6 g/dL (ref 6.0–8.5)

## 2018-02-05 NOTE — Assessment & Plan Note (Signed)
History of carotid artery disease status post bilateral carotid endarterectomies problem with right endarterectomy 04/08/2012 and left 07/05/2013 which he follows by duplex ultrasound.

## 2018-02-05 NOTE — Assessment & Plan Note (Signed)
History of hyperlipidemia on statin therapy. We will recheck a lipid and liver profile 

## 2018-02-05 NOTE — Patient Instructions (Signed)
Medication Instructions: Your physician recommends that you continue on your current medications as directed. Please refer to the Current Medication list given to you today.  Labwork: Your physician recommends that you return for a FASTING lipid profile and hepatic function panel today.   Follow-Up: Your physician wants you to follow-up in: 1 year with Dr. Gwenlyn Found. You will receive a reminder letter in the mail two months in advance. If you don't receive a letter, please call our office to schedule the follow-up appointment.  If you need a refill on your cardiac medications before your next appointment, please call your pharmacy.

## 2018-02-05 NOTE — Assessment & Plan Note (Signed)
History of peripheral arterial disease with known iliac disease and SFA occlusions although he denies claudication.

## 2018-02-05 NOTE — Progress Notes (Signed)
02/05/2018 Alec Harris   June 12, 1946  [id number]  Primary Physician Patient, No Pcp Per Primary Cardiologist: Lorretta Harp MD FACP, [place], [place], [place]  HPI:  Alec Harris is a 72 y.o.  mildly overweight married African American male father of 2, grandfather to 2 grandchildren who I last saw in the office  12/31/2016.[name] He was referred to me by Dr. Debara Pickett for PV evaluation. He has severe diffuse vascular disease involving his iliacs, SFAs and carotids. His claudication improved with Pletal. His other problems include hyperlipidemia. He had a negative Myoview Jan 14, 2012. He does continue to smoke. He stopped his Crestor on his own and did have an elevated lipid profile in the past with a total cholesterol of 255, LDL of 169 and HDL of 56. I angiogrammed him on March 02, 2012 revealing total SFAs bilaterally with 3-vessel runoff below the knee, high-grade calcified iliac disease, and bilateral carotid artery stenosis, 95% right and 80% left with a type 2 arch. He underwent elective right carotid endarterectomy by Dr. Annamarie Major on April 08, 2012.over the last 6 months he denies chest pain shortness of breath or claudication. The Pletal has been beneficial. Unfortunately he did not obtain his carotid artery Doppler studies as originally intended. He had followup carotid Dopplers performed in our office on 01/18/13 which revealed a widely patent right carotid endarterectomy site, and progression of disease on the left which had already been documented angiographically to be in the 80% range. He was neurologically asymptomatic. He ultimately underwent elective left internal carotid artery stenting by myself and Dr. Trula Slade 07/05/13. He denies chest pain or shortness of breath but does complain of lifestyle limiting claudication. Lower cineangiography performed 03/02/12 by myself revealed occluded SFAs bilaterally with high-grade calcified right external and left common iliac artery stenosis. His last  Dopplers performed 09/28/14 revealed ABIs in the midpoint 6 range bilaterally with occluded SFAs and high-grade iliacs bilaterally. The patient really denies lifestyle limiting claudication, chest pain or shortness of breath since I saw him back a year ago. He does continue to smoke a half a pack of cigarettes a day however.     Current Meds  Medication Sig  . amLODipine (NORVASC) 5 MG tablet TAKE 1 TABLET (5 MG TOTAL) BY MOUTH DAILY.  [name] aspirin EC 81 MG tablet Take 81 mg by mouth daily.  [name] atorvastatin (LIPITOR) 20 MG tablet Take 1 tablet (20 mg total) by mouth daily. Please call to make appointment for further refills  . Calcium Carb-Cholecalciferol (CALCIUM 500 +D) 500-400 MG-UNIT TABS Take 1 tablet by mouth daily.   [name] lidocaine-prilocaine (EMLA) cream Apply 1 application topically as needed.  . prochlorperazine (COMPAZINE) 10 MG tablet Take 1 tablet (10 mg total) by mouth every 6 (six) hours as needed for nausea or vomiting.     No Known Allergies  Social History   Socioeconomic History  . Marital status: Married    Spouse name: Not on file  . Number of children: Not on file  . Years of education: Not on file  . Highest education level: Not on file  Occupational History  . Not on file  Social Needs  . Financial resource strain: Not on file  . Food insecurity:    Worry: Not on file    Inability: Not on file  . Transportation needs:    Medical: Not on file    Non-medical: Not on file  Tobacco Use  . Smoking status: Light Tobacco Smoker  Years: 50.00    Types: Cigarettes  . Smokeless tobacco: Never Used  . Tobacco comment: pt states that he is going to quit completely; 1/2 pack per day  Substance and Sexual Activity  . Alcohol use: Yes    Alcohol/week: 3.0 oz    Types: 3 Cans of beer, 2 Shots of liquor per week  . Drug use: No  . Sexual activity: Never    Birth control/protection: None    Comment: smokes 5 cigarettes per day....  Lifestyle  . Physical activity:     Days per week: Not on file    Minutes per session: Not on file  . Stress: Not on file  Relationships  . Social connections:    Talks on phone: Not on file    Gets together: Not on file    Attends religious service: Not on file    Active member of club or organization: Not on file    Attends meetings of clubs or organizations: Not on file    Relationship status: Not on file  . Intimate partner violence:    Fear of current or ex partner: Not on file    Emotionally abused: Not on file    Physically abused: Not on file    Forced sexual activity: Not on file  Other Topics Concern  . Not on file  Social History Narrative  . Not on file     Review of Systems: General: negative for chills, fever, night sweats or weight changes.  Cardiovascular: negative for chest pain, dyspnea on exertion, edema, orthopnea, palpitations, paroxysmal nocturnal dyspnea or shortness of breath Dermatological: negative for rash Respiratory: negative for cough or wheezing Urologic: negative for hematuria Abdominal: negative for nausea, vomiting, diarrhea, bright red blood per rectum, melena, or hematemesis Neurologic: negative for visual changes, syncope, or dizziness All other systems reviewed and are otherwise negative except as noted above.    Blood pressure (!) 112/58, pulse 60, height 5\' 9"  (1.753 m), weight 177 lb (80.3 kg).  General appearance: alert and no distress Neck: no adenopathy, no carotid bruit, no JVD, supple, symmetrical, trachea midline and thyroid not enlarged, symmetric, no tenderness/mass/nodules Lungs: clear to auscultation bilaterally Heart: regular rate and rhythm, S1, S2 normal, no murmur, click, rub or gallop Extremities: extremities normal, atraumatic, no cyanosis or edema Pulses: 2+ and symmetric Skin: Skin color, texture, turgor normal. No rashes or lesions Neurologic: Alert and oriented X 3, normal strength and tone. Normal symmetric reflexes. Normal coordination and  gait  EKG sinus rhythm at 60 without ST or T wave changes.  I personally reviewed this EKG.  ASSESSMENT AND PLAN:   Occlusion and stenosis of carotid artery without mention of cerebral infarction History of carotid artery disease status post bilateral carotid endarterectomies problem with right endarterectomy 04/08/2012 and left 07/05/2013 which he follows by duplex ultrasound.  PVD (peripheral vascular disease) with claudication History of peripheral arterial disease with known iliac disease and SFA occlusions although he denies claudication.  Hyperlipemia History of hyperlipidemia on statin therapy.  We will recheck a lipid and liver profile      Lorretta Harp MD [facility], [facility] 02/05/2018 10:43 AM

## 2018-02-10 ENCOUNTER — Encounter: Payer: Self-pay | Admitting: *Deleted

## 2018-03-09 ENCOUNTER — Telehealth: Payer: Self-pay | Admitting: Cardiovascular Disease

## 2018-03-09 NOTE — Telephone Encounter (Signed)
Attempt to return call, no answer, states "system enter code"

## 2018-03-09 NOTE — Telephone Encounter (Signed)
New Message:    Please call pt, he wants some medicine called in that he took about 2 years ago,he does not know the name of it.

## 2018-03-10 NOTE — Telephone Encounter (Signed)
Returned patient phone call. Stated that he had questions on if he needed to return back to taking the cilostazol medication. He meant to ask at his last appointment with Dr.Berry on 02/05/18, but states he forgot and now would like to know if he needs to continue on it since it was told by pharmacy that he no longer had refills because he needed an appointment.  Please advise.  Thanks!

## 2018-03-11 NOTE — Telephone Encounter (Signed)
Okay to discontinue cilostazol.

## 2018-03-11 NOTE — Telephone Encounter (Signed)
Spoke with pt, aware of dr berry's recommendations. 

## 2018-03-24 ENCOUNTER — Inpatient Hospital Stay: Payer: Medicare Other | Attending: Oncology

## 2018-03-24 DIAGNOSIS — Z9079 Acquired absence of other genital organ(s): Secondary | ICD-10-CM | POA: Diagnosis not present

## 2018-03-24 DIAGNOSIS — Z79818 Long term (current) use of other agents affecting estrogen receptors and estrogen levels: Secondary | ICD-10-CM | POA: Insufficient documentation

## 2018-03-24 DIAGNOSIS — Z7982 Long term (current) use of aspirin: Secondary | ICD-10-CM | POA: Insufficient documentation

## 2018-03-24 DIAGNOSIS — C7951 Secondary malignant neoplasm of bone: Secondary | ICD-10-CM | POA: Diagnosis not present

## 2018-03-24 DIAGNOSIS — C61 Malignant neoplasm of prostate: Secondary | ICD-10-CM | POA: Diagnosis not present

## 2018-03-24 DIAGNOSIS — Z79899 Other long term (current) drug therapy: Secondary | ICD-10-CM | POA: Insufficient documentation

## 2018-03-24 LAB — CBC WITH DIFFERENTIAL (CANCER CENTER ONLY)
BASOS PCT: 0 %
Basophils Absolute: 0 10*3/uL (ref 0.0–0.1)
EOS ABS: 0.1 10*3/uL (ref 0.0–0.5)
EOS PCT: 2 %
HCT: 41 % (ref 38.4–49.9)
Hemoglobin: 13.5 g/dL (ref 13.0–17.1)
LYMPHS ABS: 3.7 10*3/uL — AB (ref 0.9–3.3)
Lymphocytes Relative: 50 %
MCH: 30 pg (ref 27.2–33.4)
MCHC: 32.9 g/dL (ref 32.0–36.0)
MCV: 91.1 fL (ref 79.3–98.0)
MONOS PCT: 8 %
Monocytes Absolute: 0.6 10*3/uL (ref 0.1–0.9)
Neutro Abs: 3 10*3/uL (ref 1.5–6.5)
Neutrophils Relative %: 40 %
PLATELETS: 182 10*3/uL (ref 140–400)
RBC: 4.5 MIL/uL (ref 4.20–5.82)
RDW: 18 % — ABNORMAL HIGH (ref 11.0–14.6)
WBC: 7.4 10*3/uL (ref 4.0–10.3)

## 2018-03-24 LAB — CMP (CANCER CENTER ONLY)
ALT: 9 U/L (ref 0–44)
ANION GAP: 8 (ref 5–15)
AST: 9 U/L — ABNORMAL LOW (ref 15–41)
Albumin: 3.3 g/dL — ABNORMAL LOW (ref 3.5–5.0)
Alkaline Phosphatase: 86 U/L (ref 38–126)
BUN: 19 mg/dL (ref 8–23)
CHLORIDE: 107 mmol/L (ref 98–111)
CO2: 23 mmol/L (ref 22–32)
Calcium: 8.8 mg/dL — ABNORMAL LOW (ref 8.9–10.3)
Creatinine: 1.1 mg/dL (ref 0.61–1.24)
GFR, Estimated: >60 mL/min (ref >60)
Glucose, Bld: 106 mg/dL — ABNORMAL HIGH (ref 70–99)
Potassium: 4.5 mmol/L (ref 3.5–5.1)
Sodium: 138 mmol/L (ref 135–145)
Total Bilirubin: 0.5 mg/dL (ref 0.3–1.2)
Total Protein: 6.9 g/dL (ref 6.5–8.1)

## 2018-03-25 ENCOUNTER — Inpatient Hospital Stay (HOSPITAL_BASED_OUTPATIENT_CLINIC_OR_DEPARTMENT_OTHER): Payer: Medicare Other | Admitting: Oncology

## 2018-03-25 ENCOUNTER — Inpatient Hospital Stay: Payer: Medicare Other

## 2018-03-25 ENCOUNTER — Telehealth: Payer: Self-pay | Admitting: Oncology

## 2018-03-25 VITALS — BP 118/66 | HR 57 | Temp 98.6°F | Resp 18

## 2018-03-25 VITALS — BP 148/61 | HR 65 | Temp 98.4°F | Resp 17 | Ht 69.0 in | Wt 179.9 lb

## 2018-03-25 DIAGNOSIS — Z7982 Long term (current) use of aspirin: Secondary | ICD-10-CM

## 2018-03-25 DIAGNOSIS — Z79899 Other long term (current) drug therapy: Secondary | ICD-10-CM | POA: Diagnosis not present

## 2018-03-25 DIAGNOSIS — C61 Malignant neoplasm of prostate: Secondary | ICD-10-CM

## 2018-03-25 DIAGNOSIS — Z79818 Long term (current) use of other agents affecting estrogen receptors and estrogen levels: Secondary | ICD-10-CM

## 2018-03-25 DIAGNOSIS — C7951 Secondary malignant neoplasm of bone: Secondary | ICD-10-CM | POA: Diagnosis not present

## 2018-03-25 DIAGNOSIS — Z95828 Presence of other vascular implants and grafts: Secondary | ICD-10-CM

## 2018-03-25 DIAGNOSIS — Z9079 Acquired absence of other genital organ(s): Secondary | ICD-10-CM

## 2018-03-25 LAB — PROSTATE-SPECIFIC AG, SERUM (LABCORP): Prostate Specific Ag, Serum: 6.3 ng/mL — ABNORMAL HIGH (ref 0.0–4.0)

## 2018-03-25 MED ORDER — LEUPROLIDE ACETATE (4 MONTH) 30 MG IM KIT
30.0000 mg | PACK | Freq: Once | INTRAMUSCULAR | Status: AC
  Administered 2018-03-25: 30 mg via INTRAMUSCULAR
  Filled 2018-03-25: qty 30

## 2018-03-25 MED ORDER — ENZALUTAMIDE 40 MG PO CAPS: 160.0000 mg | ORAL_CAPSULE | Freq: Every day | ORAL | 0 refills | Status: DC

## 2018-03-25 NOTE — Telephone Encounter (Signed)
Appointments scheduled AVS/Calendar printed per 8/8 los °

## 2018-03-25 NOTE — Patient Instructions (Signed)

## 2018-03-25 NOTE — Progress Notes (Signed)
Hematology and Oncology Follow Up Visit  Alec Harris [id number] November 29, 1945 72 y.o. 03/25/2018 3:29 PM Patient, No Pcp [name], MD   Principle Diagnosis: 72 year old man with castration-resistant metastatic prostate cancer with disease to the bone documented in 2013 after initial diagnosis in 2003.  Gleason score 8 at the time of diagnosis.  Prior Therapy:  He underwent a prostatectomy with a pathology that showed prostatic adenocarcinoma. Gleason score 4+4 equals 8, with the tumor involving the margins and the pathologic staging was pT4 N1 with 1 of 2 left pelvic lymph nodes involved.   He was started on adjuvant hormone therapy with Lupron. He developed a rising PSA and bone metastasis.   He was then treated with ketoconazole and prednisone. He developed a rising PSA with a doubling time of just over 6 months. Bone scan in September 2013 showed increased uptake in the right lower lumbar spine at L4 and L5 and abnormal uptake in the mid sternum which had progressed since the prior bone scan.  Zytiga 1000 mg daily started in October 2013.  Discontinued in October 2018 after progression of disease.  Taxotere chemotherapy at 75 mg per meter square started on 06/02/2017.  The dose was reduced to 60 mg/m starting with cycle 3.  He completed 10 cycles of therapy and April 2019.  Current therapy: Active surveillance.    Interim History: [name] is here for a follow-up.  Since last visit, he reports no major changes in his health.  He continues to notice improvement in his overall performance status and activity level.  He denies any residual complications related to chemotherapy including peripheral neuropathy or excessive fatigue.  He does report some constipation but no abdominal pain or hematochezia.  He is eating well and have gained more weight.  Continues to attend to activities of daily living.  He denies any headaches blurred vision or double vision.  He denies any alteration  mental status or dizziness.  He denies any weight loss or anorexia.  He denies any fevers, chills or sweats.  He denied any chest pain, palpitation or leg edema.  He denies any cough, wheezing or hemoptysis.  He denies any nausea, vomiting or abdominal pain.  He denies any changes in bowel habits.  Does not report any frequency urgency or hesitancy. He does not report any bone pain or pathological fractures.  He does not report skin rashes or lesions.  He denies any bleeding or clotting tendencies.  Remainder of his review of systems is negative.   Medications: I have reviewed the patient's current medications.  Remain unchanged. Current Outpatient Medications  Medication Sig Dispense Refill  . amLODipine (NORVASC) 5 MG tablet TAKE 1 TABLET (5 MG TOTAL) BY MOUTH DAILY. 90 tablet 3  . aspirin EC 81 MG tablet Take 81 mg by mouth daily.    [name] atorvastatin (LIPITOR) 20 MG tablet Take 1 tablet (20 mg total) by mouth daily. Please call to make appointment for further refills 90 tablet 0  . Calcium Carb-Cholecalciferol (CALCIUM 500 +D) 500-400 MG-UNIT TABS Take 1 tablet by mouth daily.     [name] lidocaine-prilocaine (EMLA) cream Apply 1 application topically as needed. 30 g 0  . prochlorperazine (COMPAZINE) 10 MG tablet Take 1 tablet (10 mg total) by mouth every 6 (six) hours as needed for nausea or vomiting. 30 tablet 0   No current facility-administered medications for this visit.    His past medical history, family history and social history reviewed today without any  noted changes.  Allergies: No Known Allergies   Physical Exam:  Blood pressure (!) 148/61, pulse 65, temperature 98.4 F (36.9 C), temperature source Oral, resp. rate 17, height 5\' 9"  (1.753 m), weight 179 lb 14.4 oz (81.6 kg), SpO2 100 %.    ECOG: 1 General appearance: Well-appearing gentleman appeared comfortable Head: Atraumatic without abnormalities. Oropharynx: Without any thrush or ulcers. Eyes: Pupils are equal and round  reactive to light. Lymph nodes: No lymphadenopathy noted in the cervical, axillary or supraclavicular areas. Heart: Regular rate and rhythm without any murmurs or gallops.   Lung: Clear in all lung fields without any wheezes or dullness to percussion. Abdomen: Soft, without any rebound or guarding.  No shifting dullness or ascites. Musculoskeletal: No joint deformity or effusion. Neurological examination: Intact motor, sensory exam and deep tendon reflexes. Skin: No ecchymosis or petechiae.   Lab Results: Lab Results  Component Value Date   WBC 7.4 03/24/2018   HGB 13.5 03/24/2018   HCT 41.0 03/24/2018   MCV 91.1 03/24/2018   PLT 182 03/24/2018     Chemistry      Component Value Date/Time   NA 138 03/24/2018 1431   NA 137 08/05/2017 0839   K 4.5 03/24/2018 1431   K 3.9 08/05/2017 0839   CL 107 03/24/2018 1431   CL 106 02/01/2013 1120   CO2 23 03/24/2018 1431   CO2 23 08/05/2017 0839   BUN 19 03/24/2018 1431   BUN 12.2 08/05/2017 0839   CREATININE 1.10 03/24/2018 1431   CREATININE 0.9 08/05/2017 0839      Component Value Date/Time   CALCIUM 8.8 (L) 03/24/2018 1431   CALCIUM 8.6 08/05/2017 0839   ALKPHOS 86 03/24/2018 1431   ALKPHOS 146 08/05/2017 0839   AST 9 (L) 03/24/2018 1431   AST 7 08/05/2017 0839   ALT 9 03/24/2018 1431   ALT <6 08/05/2017 0839   BILITOT 0.5 03/24/2018 1431   BILITOT 0.46 08/05/2017 0839      Results for [name], [name] (MRN [id number]) as of 03/25/2018 15:24  Ref. Range 01/19/2018 10:18 03/24/2018 14:31  Prostate Specific Ag, Serum Latest Ref Range: 0.0 - 4.0 ng/mL 2.2 6.3 (H)      Impression and Plan:   72 year old man with:  1. Castration-resistant prostate cancer with lymphadenopathy and bone disease and lymphadenopathy documented in 2013.  He is status post Zytiga and Taxotere chemotherapy.  His a PSA with an excellent response to Taxotere chemotherapy with a nadir of 2.2 documented in June 2019.  His PSA in August 2019 was up to 6.3.   He is asymptomatic at this time without any complaints of worsening bony disease.  Treatment options were reviewed today which include continued active surveillance, restarting systemic chemotherapy or a trial of Xtandi.  Risks and benefits of all these approaches including complications related to [facility] were reviewed today.  These complications include fatigue, nausea, edema and rarely seizures.  After discussion today he is agreeable to proceed at this time.  He will be started on Xtandi 160 mg daily and will recheck his PSA in 6 weeks.   2. Androgen deprivation.  Risks and benefits of restarting Lupron was reviewed today.  Will receive 30 mg subcutaneously every 4 months.  3. Bone directed therapy.  Risks and benefits of starting Delton See was reviewed today like to continue to defer this option.  We will continue to address that with him after obtaining dental clearance.  4. Anemia: His hemoglobin is normal at this time after  completion of chemotherapy.  5. Bone pain: Resolved after treatment with chemotherapy.  6. IV access: Port-A-Cath will remain in place for the time being and will be flushed periodically.  His next flush appointment will be in 6 weeks.  7. Followup.  In 4 to 6 weeks to assess his progress and complications of Xtandi.  25 minutes was spent face-to-face with the patient today.  More than 50% was spent in counseling, discussing the natural course of this disease, treatment options and complications related to all these treatments.  Zola Button, MD 8/8/20193:29 PM

## 2018-03-29 ENCOUNTER — Telehealth: Payer: Self-pay | Admitting: Pharmacist

## 2018-03-29 DIAGNOSIS — C61 Malignant neoplasm of prostate: Secondary | ICD-10-CM

## 2018-03-29 NOTE — Telephone Encounter (Signed)
Oral Oncology Pharmacist Encounter  Received new prescription for Xtandi (enzalutamide) for the treatment of metastatic, catsration-resistant prostate cancer, previously treated with abiraterone (2013-2018) followed by docetaxel (2018-2019), planned duration until disease progression or unacceptable toxicity.  Labs from 03/24/2018 assessed, OK for treatment. BPs reviewed, WNL Lipid panel reviewed, LDL within goal  Current medication list in Epic reviewed, DDIs with Xtandi with atorvastatin and amlodipine identified:  Gillermina Phy is an inducer of CYP3A4 leading to possible increased metabolism of, and possible decreased exposure to both atorvastatin and amlodipine. No change to current therapy indicated at this time. Lipid panel and blood pressure will continue to be monitored. Medication interactions will be discussed with patient.  High copayment for Gillermina Phy is expected with Medicare prescription coverage. Patient previously enrolled with manufacturer in order to obtain Zytiga. Insurance authorization will be submitted. Oral Oncology Clinic will work with patient for enrollment with [employer].  Oral Oncology Clinic will continue to follow for insurance authorization, copayment issues, initial counseling and start date.  Johny Drilling, PharmD, BCPS, BCOP  03/29/2018 2:06 PM Oral Oncology Clinic [phone]

## 2018-03-30 MED ORDER — ENZALUTAMIDE 40 MG PO CAPS: 160.0000 mg | ORAL_CAPSULE | Freq: Every day | ORAL | 0 refills | Status: DC

## 2018-03-30 NOTE — Telephone Encounter (Signed)
Oral Chemotherapy Pharmacist Encounter   Attempted to reach patient to provide update and offer for initial counseling on oral medication: Xtandi.  No answer. Left VM for patient to call back.   Patient with high copayment for Xtandi. Oral Oncology Clinic will work with patient to obtain manufacturer assistance once we are able to connect.  Johny Drilling, PharmD, BCPS, BCOP  03/30/2018   3:32 PM Oral Oncology Clinic [phone]

## 2018-03-31 ENCOUNTER — Telehealth: Payer: Self-pay

## 2018-03-31 MED FILL — XTANDI 40 MG CAPSULE: 40 | 30 days supply | Qty: 120 | Fill #0

## 2018-03-31 NOTE — Telephone Encounter (Signed)
Oral Oncology Patient Advocate Encounter  Confirmed with [place] that Alec Harris was picked up 03/31/18. $0 copay with [employer]   [place] Patient [place] Phone [phone] Fax [phone]

## 2018-03-31 NOTE — Telephone Encounter (Signed)
Oral Oncology Patient Advocate Alec Harris copay is [phone]. I was able to get a grant from [employer] ([employer]F) conditionally approved. [employer] will mail the patient a letter for more information.  Denyse Amass spoke with the patient about this and he understands that if he needs help with this at all he can call us.  Fatima Sanger information is as followsKara Dies: [id number] PCN:AS ID: [id number]  I have shared this information with [place].  [place] Patient [place] Phone [phone] Fax [phone]

## 2018-03-31 NOTE — Telephone Encounter (Signed)
Oral Oncology Patient Advocate Encounter  Prior Authorization for Alec Harris has been approved.    PA# [id number] Effective dates: 12/30/17 through 03/29/21  Oral Oncology Clinic will continue to follow.   [place] Patient [place] Phone [phone] Fax [phone]

## 2018-03-31 NOTE — Telephone Encounter (Signed)
Oral Oncology Patient Advocate Encounter  Received notification from [place] that prior authorization for Gillermina Phy is required.  PA submitted on CoverMyMeds [place] [id number] Status is pending  Oral Oncology Clinic will continue to follow.  [place] Patient [place] Phone [phone] Fax [phone]

## 2018-03-31 NOTE — Telephone Encounter (Signed)
Oral Chemotherapy Pharmacist Encounter   I spoke with patient for overview of: Xtandi.   Counseled patient on administration, dosing, side effects, monitoring, drug-food interactions, safe handling, storage, and disposal.  Patient will take Xtandi 40mg  capsules, 4 capsules (160mg ) by mouth once daily without regard to food.  Xtandi start date: 04/01/2018  Adverse effects include but are not limited to: peripheral edema, GI upset, hypertension, hot flashes, fatigue, falls/fractures, and arthralgias.   Patient instructed about small risk of seizures with Xtandi treatment.  Reviewed with patient importance of keeping a medication schedule and plan for any missed doses.  Medication reconciliation performed and medication/allergy list updated.  Patient will pick-up his Xtandi today (03/31/18) from the [facility] for copayment $0 with the assistance of copayment grant secured by Oral Oncology Patient Advocate. Patient informed about refill process at the pharmacy.  All questions answered.  Mr. Galentine voiced understanding and appreciation.   Patient knows to call the office with questions or concerns.  Oral Oncology Clinic will continue to follow.  Thank you,  Johny Drilling, PharmD, BCPS, BCOP  03/31/2018   9:03 AM Oral Oncology Clinic [phone]

## 2018-04-13 ENCOUNTER — Other Ambulatory Visit: Payer: Self-pay | Admitting: Cardiovascular Disease

## 2018-04-13 NOTE — Telephone Encounter (Signed)
Rx sent to pharmacy   

## 2018-04-21 ENCOUNTER — Other Ambulatory Visit: Payer: Self-pay | Admitting: Oncology

## 2018-04-21 DIAGNOSIS — C61 Malignant neoplasm of prostate: Secondary | ICD-10-CM

## 2018-04-26 MED FILL — XTANDI 40 MG CAPSULE: 40 | 30 days supply | Qty: 120 | Fill #0

## 2018-04-27 ENCOUNTER — Telehealth: Payer: Self-pay | Admitting: Oncology

## 2018-04-27 NOTE — Telephone Encounter (Signed)
FS CALL DAY 9/19 - moved appointments to AM. Spoke with patient. Lab/port 9/17 remain the same.

## 2018-05-04 ENCOUNTER — Inpatient Hospital Stay: Payer: Medicare Other | Attending: Oncology

## 2018-05-04 ENCOUNTER — Inpatient Hospital Stay: Payer: Medicare Other

## 2018-05-04 DIAGNOSIS — Z9079 Acquired absence of other genital organ(s): Secondary | ICD-10-CM | POA: Diagnosis not present

## 2018-05-04 DIAGNOSIS — C7951 Secondary malignant neoplasm of bone: Secondary | ICD-10-CM | POA: Diagnosis not present

## 2018-05-04 DIAGNOSIS — Z452 Encounter for adjustment and management of vascular access device: Secondary | ICD-10-CM | POA: Insufficient documentation

## 2018-05-04 DIAGNOSIS — C61 Malignant neoplasm of prostate: Secondary | ICD-10-CM | POA: Insufficient documentation

## 2018-05-04 DIAGNOSIS — Z7982 Long term (current) use of aspirin: Secondary | ICD-10-CM | POA: Insufficient documentation

## 2018-05-04 DIAGNOSIS — Z79810 Long term (current) use of selective estrogen receptor modulators (SERMs): Secondary | ICD-10-CM | POA: Insufficient documentation

## 2018-05-04 DIAGNOSIS — Z79899 Other long term (current) drug therapy: Secondary | ICD-10-CM | POA: Insufficient documentation

## 2018-05-04 DIAGNOSIS — Z95828 Presence of other vascular implants and grafts: Secondary | ICD-10-CM

## 2018-05-04 LAB — CBC WITH DIFFERENTIAL (CANCER CENTER ONLY)
BASOS ABS: 0 10*3/uL (ref 0.0–0.1)
Basophils Relative: 0 %
EOS PCT: 2 %
Eosinophils Absolute: 0.1 10*3/uL (ref 0.0–0.5)
HEMATOCRIT: 40.4 % (ref 38.4–49.9)
Hemoglobin: 13.4 g/dL (ref 13.0–17.1)
LYMPHS PCT: 47 %
Lymphs Abs: 3.5 10*3/uL — ABNORMAL HIGH (ref 0.9–3.3)
MCH: 30.9 pg (ref 27.2–33.4)
MCHC: 33.2 g/dL (ref 32.0–36.0)
MCV: 93.3 fL (ref 79.3–98.0)
Monocytes Absolute: 0.5 10*3/uL (ref 0.1–0.9)
Monocytes Relative: 7 %
NEUTROS ABS: 3.3 10*3/uL (ref 1.5–6.5)
NEUTROS PCT: 44 %
PLATELETS: 193 10*3/uL (ref 140–400)
RBC: 4.33 MIL/uL (ref 4.20–5.82)
RDW: 16.4 % — ABNORMAL HIGH (ref 11.0–14.6)
WBC: 7.4 10*3/uL (ref 4.0–10.3)

## 2018-05-04 LAB — CMP (CANCER CENTER ONLY)
AST: 8 U/L — AB (ref 15–41)
Albumin: 3.2 g/dL — ABNORMAL LOW (ref 3.5–5.0)
Alkaline Phosphatase: 87 U/L (ref 38–126)
Anion gap: 7 (ref 5–15)
BUN: 19 mg/dL (ref 8–23)
CHLORIDE: 108 mmol/L (ref 98–111)
CO2: 25 mmol/L (ref 22–32)
Calcium: 9.3 mg/dL (ref 8.9–10.3)
Creatinine: 0.92 mg/dL (ref 0.61–1.24)
GFR, Est AFR Am: >60 mL/min (ref >60)
Glucose, Bld: 102 mg/dL — ABNORMAL HIGH (ref 70–99)
Potassium: 4.3 mmol/L (ref 3.5–5.1)
Sodium: 140 mmol/L (ref 135–145)
Total Bilirubin: 0.6 mg/dL (ref 0.3–1.2)
Total Protein: 6.8 g/dL (ref 6.5–8.1)

## 2018-05-04 MED ORDER — SODIUM CHLORIDE 0.9% FLUSH
10.0000 mL | INTRAVENOUS | Status: DC | PRN
  Administered 2018-05-04: 10 mL via INTRAVENOUS
  Filled 2018-05-04: qty 10

## 2018-05-04 MED ORDER — HEPARIN SOD (PORK) LOCK FLUSH 100 UNIT/ML IV SOLN
500.0000 [IU] | Freq: Once | INTRAVENOUS | Status: AC | PRN
  Administered 2018-05-04: 500 [IU] via INTRAVENOUS
  Filled 2018-05-04: qty 5

## 2018-05-05 LAB — PROSTATE-SPECIFIC AG, SERUM (LABCORP): Prostate Specific Ag, Serum: 8.2 ng/mL — ABNORMAL HIGH (ref 0.0–4.0)

## 2018-05-06 ENCOUNTER — Ambulatory Visit: Payer: Medicare Other | Admitting: Oncology

## 2018-05-06 ENCOUNTER — Telehealth: Payer: Self-pay

## 2018-05-06 ENCOUNTER — Inpatient Hospital Stay (HOSPITAL_BASED_OUTPATIENT_CLINIC_OR_DEPARTMENT_OTHER): Payer: Medicare Other | Admitting: Oncology

## 2018-05-06 VITALS — BP 153/69 | HR 56 | Temp 97.7°F | Resp 18 | Ht 69.0 in | Wt 181.6 lb

## 2018-05-06 DIAGNOSIS — Z79810 Long term (current) use of selective estrogen receptor modulators (SERMs): Secondary | ICD-10-CM

## 2018-05-06 DIAGNOSIS — Z9079 Acquired absence of other genital organ(s): Secondary | ICD-10-CM | POA: Diagnosis not present

## 2018-05-06 DIAGNOSIS — C61 Malignant neoplasm of prostate: Secondary | ICD-10-CM

## 2018-05-06 DIAGNOSIS — Z95828 Presence of other vascular implants and grafts: Secondary | ICD-10-CM

## 2018-05-06 DIAGNOSIS — Z7951 Long term (current) use of inhaled steroids: Secondary | ICD-10-CM

## 2018-05-06 DIAGNOSIS — Z7982 Long term (current) use of aspirin: Secondary | ICD-10-CM | POA: Diagnosis not present

## 2018-05-06 DIAGNOSIS — Z79899 Other long term (current) drug therapy: Secondary | ICD-10-CM | POA: Diagnosis not present

## 2018-05-06 DIAGNOSIS — C7951 Secondary malignant neoplasm of bone: Secondary | ICD-10-CM | POA: Diagnosis not present

## 2018-05-06 DIAGNOSIS — Z452 Encounter for adjustment and management of vascular access device: Secondary | ICD-10-CM | POA: Diagnosis not present

## 2018-05-06 NOTE — Telephone Encounter (Signed)
Printed avs and calender of upcoming appointment. Per 9/19 los 

## 2018-05-06 NOTE — Progress Notes (Signed)
Hematology and Oncology Follow Up Visit  Alec Harris [id number] 05/19/1946 72 y.o. 05/06/2018 10:51 AM Patient, No Pcp [name], MD   Principle Diagnosis: 72 year old man with prostate cancer diagnosed in 2003 and subsequently developed castration-resistant disease to the bone documented in 2013.  Prior Therapy:  He underwent a prostatectomy with a pathology that showed prostatic adenocarcinoma. Gleason score 4+4 equals 8, with the tumor involving the margins and the pathologic staging was pT4 N1 with 1 of 2 left pelvic lymph nodes involved.   He was started on adjuvant hormone therapy with Lupron. He developed a rising PSA and bone metastasis.   He was then treated with ketoconazole and prednisone. He developed a rising PSA with a doubling time of just over 6 months. Bone scan in September 2013 showed increased uptake in the right lower lumbar spine at L4 and L5 and abnormal uptake in the mid sternum which had progressed since the prior bone scan.  Zytiga 1000 mg daily started in October 2013.  Discontinued in October 2018 after progression of disease.  Taxotere chemotherapy at 75 mg per meter square started on 06/02/2017.  The dose was reduced to 60 mg/m starting with cycle 3.  He completed 10 cycles of therapy and April 2019.  Current therapy: Xtandi 160 mg daily started in August 2019.  Lupron 30 mg every 4 months his next injection due in December 2019.    Interim History: Mr. Hurlbut is here for a follow-up visit.  Since the last visit, he continues to take [facility] without complications.  He denies any nausea, fatigue or bone pain.  He remains active and continues to attend to activities of daily living.  He has gained weight without any residual complications related to chemotherapy.  His performance status and quality of life remain excellent.  He denies any headaches blurred vision or double vision.  He denies any confusion or lethargy.  He denies any decline in his  appetite.  He denies any fevers, chills or sweats.  He denied any chest pain, palpitation or leg edema.  He denies any cough, wheezing or hemoptysis.  He denies any nausea, vomiting or abdominal pain.  He denies any constipation or diarrhea.  Does not report any frequency urgency or hesitancy. He does not report any arthralgias or myalgias.  He does not report skin rashes or lesions.  He denies any lymphadenopathy or petechiae.  Remainder of his review of systems is negative.   Medications:   Reviewed today with any changes. Current Outpatient Medications  Medication Sig Dispense Refill  . amLODipine (NORVASC) 5 MG tablet TAKE 1 TABLET (5 MG TOTAL) BY MOUTH DAILY. 90 tablet 3  . aspirin EC 81 MG tablet Take 81 mg by mouth daily.    [name] atorvastatin (LIPITOR) 20 MG tablet Take 1 tablet (20 mg total) by mouth daily. 90 tablet 1  . Calcium Carb-Cholecalciferol (CALCIUM 500 +D) 500-400 MG-UNIT TABS Take 1 tablet by mouth daily.     [name] lidocaine-prilocaine (EMLA) cream Apply 1 application topically as needed. 30 g 0  . prochlorperazine (COMPAZINE) 10 MG tablet Take 1 tablet (10 mg total) by mouth every 6 (six) hours as needed for nausea or vomiting. 30 tablet 0  . XTANDI 40 MG capsule TAKE 4 CAPSULES (160 MG TOTAL) BY MOUTH DAILY. 120 capsule 0   No current facility-administered medications for this visit.    His past medical history, family history and social history reviewed today without any noted changes.  Allergies:  No Known Allergies   Physical Exam:   Blood pressure (!) 153/69, pulse (!) 56, temperature 97.7 F (36.5 C), temperature source Oral, resp. rate 18, height 5\' 9"  (1.753 m), weight 181 lb 9.6 oz (82.4 kg), SpO2 100 %.    ECOG: 1   General appearance: Comfortable appearing without any discomfort Head: Normocephalic without any trauma Oropharynx: Mucous membranes are moist and pink without any thrush or ulcers. Eyes: Pupils are equal and round reactive to light. Lymph nodes:  No cervical, supraclavicular, inguinal or axillary lymphadenopathy.   Heart:regular rate and rhythm.  S1 and S2 without leg edema. Lung: Clear without any rhonchi or wheezes.  No dullness to percussion. Abdomin: Soft, nontender, nondistended with good bowel sounds.  No hepatosplenomegaly. Musculoskeletal: No joint deformity or effusion.  Full range of motion noted. Neurological: No deficits noted on motor, sensory and deep tendon reflex exam. Skin: No petechial rash or dryness.  Appeared moist.     Lab Results: Lab Results  Component Value Date   WBC 7.4 05/04/2018   HGB 13.4 05/04/2018   HCT 40.4 05/04/2018   MCV 93.3 05/04/2018   PLT 193 05/04/2018     Chemistry      Component Value Date/Time   NA 140 05/04/2018 1528   NA 137 08/05/2017 0839   K 4.3 05/04/2018 1528   K 3.9 08/05/2017 0839   CL 108 05/04/2018 1528   CL 106 02/01/2013 1120   CO2 25 05/04/2018 1528   CO2 23 08/05/2017 0839   BUN 19 05/04/2018 1528   BUN 12.2 08/05/2017 0839   CREATININE 0.92 05/04/2018 1528   CREATININE 0.9 08/05/2017 0839      Component Value Date/Time   CALCIUM 9.3 05/04/2018 1528   CALCIUM 8.6 08/05/2017 0839   ALKPHOS 87 05/04/2018 1528   ALKPHOS 146 08/05/2017 0839   AST 8 (L) 05/04/2018 1528   AST 7 08/05/2017 0839   ALT <6 05/04/2018 1528   ALT <6 08/05/2017 0839   BILITOT 0.6 05/04/2018 1528   BILITOT 0.46 08/05/2017 0839       Results for MARGARITO, DEHAAS (MRN [id number]) as of 05/06/2018 11:19  Ref. Range 03/24/2018 14:31 05/04/2018 15:28  Prostate Specific Ag, Serum Latest Ref Range: 0.0 - 4.0 ng/mL 6.3 (H) 8.2 (H)      Impression and Plan:   72 year old man with:  1. Castration-resistant prostate cancer documented in 2013 and has received Zytiga and Taxotere chemotherapy.  He has lymphadenopathy and limited disease to the bone.  With lymphadenopathy and bone disease and lymphadenopathy documented in 2013.  He is status post Zytiga and Taxotere chemotherapy.  He is  currently on Xtandi without any major complications.  His PSA did show some slight increase but have overall stabilized.  Risks and benefits of continuing this medication long-term complication associated with it was reviewed and is agreeable to continue.  We will continue to monitor his PSA periodically.   2. Androgen deprivation.  He is currently on Lupron which will be repeated in December 2019 at 30 mg daily.  3. Bone directed therapy.  I recommended continue calcium and vitamin D supplements and consider Xgeva after dental clearance.  4. Anemia: Resolved at this time and his hemoglobin is normal.  5. Bone pain: No issues of bone pain reported at this time.  We will continue to monitor.  6. IV access: Port-A-Cath will be flushed periodically every 6 to 8 weeks.  7. Followup.  In December 2019 to follow his progress.  15 minutes was spent face-to-face with the patient today.  More than 50% was spent on discussing the natural course of his disease as well as treatment options and complications related to therapy.  Zola Button, MD 9/19/201910:51 AM

## 2018-05-17 ENCOUNTER — Other Ambulatory Visit: Payer: Self-pay | Admitting: Oncology

## 2018-05-17 DIAGNOSIS — C61 Malignant neoplasm of prostate: Secondary | ICD-10-CM

## 2018-05-21 ENCOUNTER — Telehealth: Payer: Self-pay | Admitting: Cardiovascular Disease

## 2018-05-21 NOTE — Telephone Encounter (Signed)
Returned call to patient, patient states he needs a new prescription for his Pletal.  He states he has been on this for years and it helps his PAD.     Per chart review:  Phone note 7/23, patient called to see if he needed to continue taking this because there was no refills on the current prescription.  Dr. Gwenlyn Found said okay to discontinue.  Patient is now requesting to continue taking this medication.  Advised would send Dr. Gwenlyn Found a message to see if ok to represcribe.  Patient aware.

## 2018-05-21 NOTE — Telephone Encounter (Signed)
New Message:     Patient is requesting to have ciulostazol added to his medication list

## 2018-05-25 MED FILL — XTANDI 40 MG CAPSULE: 40 | 30 days supply | Qty: 120 | Fill #0

## 2018-05-28 MED ORDER — CILOSTAZOL 100 MG PO TABS: 100.0000 mg | ORAL_TABLET | Freq: Two times a day (BID) | ORAL | 3 refills | Status: DC

## 2018-05-28 NOTE — Telephone Encounter (Signed)
Spoke with pt, New script sent to the pharmacy  

## 2018-05-28 NOTE — Telephone Encounter (Signed)
Fine with me to re-prescribe Pletal

## 2018-06-16 ENCOUNTER — Other Ambulatory Visit: Payer: Self-pay | Admitting: Oncology

## 2018-06-16 DIAGNOSIS — C61 Malignant neoplasm of prostate: Secondary | ICD-10-CM

## 2018-06-17 ENCOUNTER — Inpatient Hospital Stay: Payer: Medicare Other | Attending: Oncology

## 2018-06-17 DIAGNOSIS — Z452 Encounter for adjustment and management of vascular access device: Secondary | ICD-10-CM | POA: Diagnosis not present

## 2018-06-17 DIAGNOSIS — Z79810 Long term (current) use of selective estrogen receptor modulators (SERMs): Secondary | ICD-10-CM | POA: Diagnosis not present

## 2018-06-17 DIAGNOSIS — Z95828 Presence of other vascular implants and grafts: Secondary | ICD-10-CM

## 2018-06-17 DIAGNOSIS — Z9079 Acquired absence of other genital organ(s): Secondary | ICD-10-CM | POA: Insufficient documentation

## 2018-06-17 DIAGNOSIS — C7951 Secondary malignant neoplasm of bone: Secondary | ICD-10-CM | POA: Diagnosis not present

## 2018-06-17 DIAGNOSIS — C61 Malignant neoplasm of prostate: Secondary | ICD-10-CM | POA: Insufficient documentation

## 2018-06-17 MED ORDER — SODIUM CHLORIDE 0.9% FLUSH
10.0000 mL | INTRAVENOUS | Status: DC | PRN
  Administered 2018-06-17: 10 mL via INTRAVENOUS
  Filled 2018-06-17: qty 10

## 2018-06-17 MED ORDER — HEPARIN SOD (PORK) LOCK FLUSH 100 UNIT/ML IV SOLN
500.0000 [IU] | Freq: Once | INTRAVENOUS | Status: AC | PRN
  Administered 2018-06-17: 500 [IU] via INTRAVENOUS
  Filled 2018-06-17: qty 5

## 2018-06-24 MED FILL — XTANDI 40 MG CAPSULE: 40 | 30 days supply | Qty: 120 | Fill #0

## 2018-07-16 ENCOUNTER — Other Ambulatory Visit: Payer: Self-pay | Admitting: Oncology

## 2018-07-16 DIAGNOSIS — C61 Malignant neoplasm of prostate: Secondary | ICD-10-CM

## 2018-07-26 ENCOUNTER — Inpatient Hospital Stay: Payer: Medicare Other

## 2018-07-26 ENCOUNTER — Inpatient Hospital Stay: Payer: Medicare Other | Attending: Oncology

## 2018-07-26 DIAGNOSIS — Z7982 Long term (current) use of aspirin: Secondary | ICD-10-CM | POA: Insufficient documentation

## 2018-07-26 DIAGNOSIS — R630 Anorexia: Secondary | ICD-10-CM | POA: Diagnosis not present

## 2018-07-26 DIAGNOSIS — D649 Anemia, unspecified: Secondary | ICD-10-CM | POA: Insufficient documentation

## 2018-07-26 DIAGNOSIS — Z5111 Encounter for antineoplastic chemotherapy: Secondary | ICD-10-CM | POA: Insufficient documentation

## 2018-07-26 DIAGNOSIS — Z9079 Acquired absence of other genital organ(s): Secondary | ICD-10-CM | POA: Diagnosis not present

## 2018-07-26 DIAGNOSIS — R9721 Rising PSA following treatment for malignant neoplasm of prostate: Secondary | ICD-10-CM | POA: Diagnosis not present

## 2018-07-26 DIAGNOSIS — Z7689 Persons encountering health services in other specified circumstances: Secondary | ICD-10-CM | POA: Insufficient documentation

## 2018-07-26 DIAGNOSIS — G893 Neoplasm related pain (acute) (chronic): Secondary | ICD-10-CM | POA: Diagnosis not present

## 2018-07-26 DIAGNOSIS — C61 Malignant neoplasm of prostate: Secondary | ICD-10-CM | POA: Insufficient documentation

## 2018-07-26 DIAGNOSIS — C7951 Secondary malignant neoplasm of bone: Secondary | ICD-10-CM | POA: Insufficient documentation

## 2018-07-26 DIAGNOSIS — Z79899 Other long term (current) drug therapy: Secondary | ICD-10-CM | POA: Diagnosis not present

## 2018-07-26 DIAGNOSIS — R634 Abnormal weight loss: Secondary | ICD-10-CM | POA: Insufficient documentation

## 2018-07-26 DIAGNOSIS — Z79818 Long term (current) use of other agents affecting estrogen receptors and estrogen levels: Secondary | ICD-10-CM | POA: Diagnosis not present

## 2018-07-26 DIAGNOSIS — Z95828 Presence of other vascular implants and grafts: Secondary | ICD-10-CM

## 2018-07-26 LAB — CMP (CANCER CENTER ONLY)
ALT: <6 U/L (ref 0–44)
AST: 9 U/L — AB (ref 15–41)
Albumin: 3 g/dL — ABNORMAL LOW (ref 3.5–5.0)
Alkaline Phosphatase: 259 U/L — ABNORMAL HIGH (ref 38–126)
Anion gap: 9 (ref 5–15)
BILIRUBIN TOTAL: 0.5 mg/dL (ref 0.3–1.2)
BUN: 14 mg/dL (ref 8–23)
CO2: 23 mmol/L (ref 22–32)
Calcium: 9.2 mg/dL (ref 8.9–10.3)
Chloride: 106 mmol/L (ref 98–111)
Creatinine: 0.85 mg/dL (ref 0.61–1.24)
Glucose, Bld: 111 mg/dL — ABNORMAL HIGH (ref 70–99)
POTASSIUM: 3.9 mmol/L (ref 3.5–5.1)
Sodium: 138 mmol/L (ref 135–145)
TOTAL PROTEIN: 6.5 g/dL (ref 6.5–8.1)

## 2018-07-26 LAB — CBC WITH DIFFERENTIAL (CANCER CENTER ONLY)
Abs Immature Granulocytes: 0.03 10*3/uL (ref 0.00–0.07)
BASOS PCT: 0 %
Basophils Absolute: 0 10*3/uL (ref 0.0–0.1)
EOS ABS: 0.1 10*3/uL (ref 0.0–0.5)
EOS PCT: 1 %
HCT: 29.9 % — ABNORMAL LOW (ref 39.0–52.0)
Hemoglobin: 9.5 g/dL — ABNORMAL LOW (ref 13.0–17.0)
IMMATURE GRANULOCYTES: 0 %
LYMPHS ABS: 2.8 10*3/uL (ref 0.7–4.0)
Lymphocytes Relative: 35 %
MCH: 28.4 pg (ref 26.0–34.0)
MCHC: 31.8 g/dL (ref 30.0–36.0)
MCV: 89.5 fL (ref 80.0–100.0)
MONO ABS: 0.8 10*3/uL (ref 0.1–1.0)
Monocytes Relative: 11 %
Neutro Abs: 4.1 10*3/uL (ref 1.7–7.7)
Neutrophils Relative %: 53 %
PLATELETS: 281 10*3/uL (ref 150–400)
RBC: 3.34 MIL/uL — ABNORMAL LOW (ref 4.22–5.81)
RDW: 14.3 % (ref 11.5–15.5)
WBC Count: 7.9 10*3/uL (ref 4.0–10.5)
nRBC: 0 % (ref 0.0–0.2)

## 2018-07-26 MED ORDER — SODIUM CHLORIDE 0.9% FLUSH
10.0000 mL | INTRAVENOUS | Status: DC | PRN
  Administered 2018-07-26: 10 mL via INTRAVENOUS
  Filled 2018-07-26: qty 10

## 2018-07-26 MED ORDER — HEPARIN SOD (PORK) LOCK FLUSH 100 UNIT/ML IV SOLN
500.0000 [IU] | Freq: Once | INTRAVENOUS | Status: AC | PRN
  Administered 2018-07-26: 500 [IU] via INTRAVENOUS
  Filled 2018-07-26: qty 5

## 2018-07-26 MED FILL — XTANDI 40 MG CAPSULE: 40 | 30 days supply | Qty: 120 | Fill #0

## 2018-07-27 ENCOUNTER — Other Ambulatory Visit: Payer: Self-pay | Admitting: Oncology

## 2018-07-27 ENCOUNTER — Inpatient Hospital Stay (HOSPITAL_BASED_OUTPATIENT_CLINIC_OR_DEPARTMENT_OTHER): Payer: Medicare Other | Admitting: Oncology

## 2018-07-27 VITALS — BP 140/62 | HR 79 | Temp 98.2°F | Resp 18 | Ht 69.0 in | Wt 181.5 lb

## 2018-07-27 DIAGNOSIS — Z79818 Long term (current) use of other agents affecting estrogen receptors and estrogen levels: Secondary | ICD-10-CM

## 2018-07-27 DIAGNOSIS — R630 Anorexia: Secondary | ICD-10-CM

## 2018-07-27 DIAGNOSIS — Z7982 Long term (current) use of aspirin: Secondary | ICD-10-CM

## 2018-07-27 DIAGNOSIS — C61 Malignant neoplasm of prostate: Secondary | ICD-10-CM

## 2018-07-27 DIAGNOSIS — Z7689 Persons encountering health services in other specified circumstances: Secondary | ICD-10-CM | POA: Diagnosis not present

## 2018-07-27 DIAGNOSIS — Z79899 Other long term (current) drug therapy: Secondary | ICD-10-CM | POA: Diagnosis not present

## 2018-07-27 DIAGNOSIS — G893 Neoplasm related pain (acute) (chronic): Secondary | ICD-10-CM

## 2018-07-27 DIAGNOSIS — R9721 Rising PSA following treatment for malignant neoplasm of prostate: Secondary | ICD-10-CM | POA: Diagnosis not present

## 2018-07-27 DIAGNOSIS — Z9079 Acquired absence of other genital organ(s): Secondary | ICD-10-CM

## 2018-07-27 DIAGNOSIS — D649 Anemia, unspecified: Secondary | ICD-10-CM | POA: Diagnosis not present

## 2018-07-27 DIAGNOSIS — C7951 Secondary malignant neoplasm of bone: Secondary | ICD-10-CM

## 2018-07-27 DIAGNOSIS — R634 Abnormal weight loss: Secondary | ICD-10-CM | POA: Diagnosis not present

## 2018-07-27 DIAGNOSIS — Z5111 Encounter for antineoplastic chemotherapy: Secondary | ICD-10-CM | POA: Diagnosis not present

## 2018-07-27 LAB — PROSTATE-SPECIFIC AG, SERUM (LABCORP): PROSTATE SPECIFIC AG, SERUM: 34.1 ng/mL — AB (ref 0.0–4.0)

## 2018-07-27 NOTE — Progress Notes (Signed)
DISCONTINUE ON PATHWAY REGIMEN - Prostate     A cycle is every 21 days:     Docetaxel      Prednisone   **Always confirm dose/schedule in your pharmacy ordering system**  REASON: Disease Progression PRIOR TREATMENT: POS37: Docetaxel 75 mg/m2 q21 Days + Prednisone 5 mg BID Until Progression or Toxicity TREATMENT RESPONSE: Unable to Evaluate  START ON PATHWAY REGIMEN - Prostate     A cycle is every 21 days.:     Cabazitaxel      Prednisone   **Always confirm dose/schedule in your pharmacy ordering system**  Patient Characteristics: Adenocarcinoma, Distant Metastases, Castration Resistant, Symptomatic, Prior Docetaxel/Docetaxel Ineligible Histology: Adenocarcinoma Therapeutic Status: Distant Metastases  Intent of Therapy: Non-Curative / Palliative Intent, Discussed with Patient

## 2018-07-27 NOTE — Progress Notes (Signed)
Hematology and Oncology Follow Up Visit  Alec Harris [id number] Dec 22, 1945 72 y.o. 07/27/2018 4:30 PM Patient, No Pcp PerShadad, [name], Harris   Principle Diagnosis: 26 year old man with castration-resistant prostate cancer with disease to the bone diagnosed in 2013.   Prior Therapy:  He underwent a prostatectomy with a pathology that showed prostatic adenocarcinoma. Gleason score 4+4 equals 8, with the tumor involving the margins and the pathologic staging was pT4 N1 with 1 of 2 left pelvic lymph nodes involved.   He was started on adjuvant hormone therapy with Lupron. He developed a rising PSA and bone metastasis.   He was then treated with ketoconazole and prednisone. He developed a rising PSA with a doubling time of just over 6 months. Bone scan in September 2013 showed increased uptake in the right lower lumbar spine at L4 and L5 and abnormal uptake in the mid sternum which had progressed since the prior bone scan.  Zytiga 1000 mg daily started in October 2013.  Discontinued in October 2018 after progression of disease.  Taxotere chemotherapy at 75 mg per meter square started on 06/02/2017.  The dose was reduced to 60 mg/m starting with cycle 3.  He completed 10 cycles of therapy and April 2019.  Current therapy:   Xtandi 160 mg daily started in August 2019.  Lupron 30 mg every 4 months his next injection due in December 2019.    Interim History: Alec Harris returns today for a repeat evaluation.  Since the last visit, he reports a few complaints.  He has reported increase in his sternal pain and overall arthralgias which has worsened in the last few weeks.  He also reported decrease in his appetite and have lost some weight.  His energy and performance status has declined slightly but still ambulating without any issues.  Continues to take Xtandi without any other complications.  He denies any recent hospitalizations or illnesses.  He denies any recurrent infections.  He denies any  headaches blurred vision or double vision.  He denies any alteration in mental status or lethargy. He denies any fevers, chills or sweats.  He denied any chest pain, palpitation or leg edema.  He denies any cough, wheezing or hemoptysis.  He denies any nausea, vomiting or abdominal distention.  He denies any changes in bowel habits.  Does not report any frequency urgency or hesitancy.  Denies any bone fractures.  He does not report skin rashes or lesions.  He denies any bleeding or clotting tendency.  Remainder of his review of systems is negative.   Medications:   Reviewed today with any changes. Current Outpatient Medications  Medication Sig Dispense Refill  . amLODipine (NORVASC) 5 MG tablet TAKE 1 TABLET (5 MG TOTAL) BY MOUTH DAILY. 90 tablet 3  . aspirin EC 81 MG tablet Take 81 mg by mouth daily.    [name] atorvastatin (LIPITOR) 20 MG tablet Take 1 tablet (20 mg total) by mouth daily. 90 tablet 1  . Calcium Carb-Cholecalciferol (CALCIUM 500 +D) 500-400 MG-UNIT TABS Take 1 tablet by mouth daily.     . cilostazol (PLETAL) 100 MG tablet Take 1 tablet (100 mg total) by mouth 2 (two) times daily. 180 tablet 3  . lidocaine-prilocaine (EMLA) cream Apply 1 application topically as needed. 30 g 0  . prochlorperazine (COMPAZINE) 10 MG tablet Take 1 tablet (10 mg total) by mouth every 6 (six) hours as needed for nausea or vomiting. 30 tablet 0  . XTANDI 40 MG capsule TAKE 4 CAPSULES (  160 MG TOTAL) BY MOUTH DAILY. 120 capsule 0   No current facility-administered medications for this visit.    His past medical history, family history and social history reviewed today without any noted changes.  Allergies: No Known Allergies   Physical Exam:   Blood pressure 140/62, pulse 79, temperature 98.2 F (36.8 C), temperature source Oral, resp. rate 18, height 5\' 9"  (1.753 m), weight 181 lb 8 oz (82.3 kg), SpO2 100 %.    ECOG: 1   General appearance: Alert, awake without any distress. Head: Atraumatic  without abnormalities Oropharynx: Without any thrush or ulcers. Eyes: No scleral icterus. Lymph nodes: No lymphadenopathy noted in the cervical, supraclavicular, or axillary nodes Heart:regular rate and rhythm, without any murmurs or gallops.   Lung: Clear to auscultation without any rhonchi, wheezes or dullness to percussion. Abdomin: Soft, nontender without any shifting dullness or ascites. Musculoskeletal: No clubbing or cyanosis. Neurological: No motor or sensory deficits. Skin: No rashes or lesions.     Lab Results: Lab Results  Component Value Date   WBC 7.9 07/26/2018   HGB 9.5 (L) 07/26/2018   HCT 29.9 (L) 07/26/2018   MCV 89.5 07/26/2018   PLT 281 07/26/2018     Chemistry      Component Value Date/Time   NA 138 07/26/2018 1522   NA 137 08/05/2017 0839   K 3.9 07/26/2018 1522   K 3.9 08/05/2017 0839   CL 106 07/26/2018 1522   CL 106 02/01/2013 1120   CO2 23 07/26/2018 1522   CO2 23 08/05/2017 0839   BUN 14 07/26/2018 1522   BUN 12.2 08/05/2017 0839   CREATININE 0.85 07/26/2018 1522   CREATININE 0.9 08/05/2017 0839      Component Value Date/Time   CALCIUM 9.2 07/26/2018 1522   CALCIUM 8.6 08/05/2017 0839   ALKPHOS 259 (H) 07/26/2018 1522   ALKPHOS 146 08/05/2017 0839   AST 9 (L) 07/26/2018 1522   AST 7 08/05/2017 0839   ALT <6 07/26/2018 1522   ALT <6 08/05/2017 0839   BILITOT 0.5 07/26/2018 1522   BILITOT 0.46 08/05/2017 0839       Results for Alec Harris (MRN [id number]) as of 07/27/2018 16:33  Ref. Range 05/04/2018 15:28 07/26/2018 15:22  Prostate Specific Ag, Serum Latest Ref Range: 0.0 - 4.0 ng/mL 8.2 (H) 34.1 (H)       Impression and Plan:   72 year old man with:  1. Castration-resistant prostate cancer with disease to the bone that has progressed on therapy as outlined above.   He has been taking Xtandi but his PSA has continued to rise and he is developing worsening bony symptoms.  The natural course of his disease and treatment  options were reviewed today.  Given his symptomatic progression of his disease, he will require different salvage therapy.  Jevtana chemotherapy would be his best option given his excellent response to chemotherapy previously.  Complication associated with this therapy include nausea, fatigue, myelosuppression, neuropathy and infusion related complications.  After discussion today, he is agreeable to proceed in the near future.  I will obtain bone scan for restaging purposes.   2. Androgen deprivation.  This will continue indefinitely.  He is due for Lupron at 30 mg this month.  This will be repeated every 4 months.  Long-term complications including weight gain and osteoporosis were reviewed.  3. Bone directed therapy.  He remains on calcium and vitamin D supplements.  4. Anemia: His hemoglobin has declined again which indicates progression of disease.  5. Bone pain: Manageable at this time with nonnarcotic analgesia.  We will continue to monitor moving forward.  6. IV access: Port-A-Cath will be used for chemotherapy.  7. Followup.  In next few weeks to start systemic chemotherapy.  25 minutes was spent face-to-face with the patient today.  More than 50% was spent on updating his disease status, reviewing laboratory data, treatment options and complications related to therapy.  Zola Button, Harris 12/10/20194:30 PM

## 2018-08-02 ENCOUNTER — Telehealth: Payer: Self-pay

## 2018-08-02 NOTE — Telephone Encounter (Signed)
Per 12/16 infusion approved appointments. Tried calling patient on mobile and was unable to leave a msg, how ever I Levada Dy) also called Velva Harman [facility] number and left a detailed voice msg per notes stated it was okay to do so. Crossed out of the book

## 2018-08-04 ENCOUNTER — Inpatient Hospital Stay: Payer: Medicare Other

## 2018-08-04 VITALS — BP 158/61 | HR 62 | Temp 97.8°F | Resp 17 | Wt 179.0 lb

## 2018-08-04 DIAGNOSIS — Z7689 Persons encountering health services in other specified circumstances: Secondary | ICD-10-CM | POA: Diagnosis not present

## 2018-08-04 DIAGNOSIS — C61 Malignant neoplasm of prostate: Secondary | ICD-10-CM

## 2018-08-04 DIAGNOSIS — Z79818 Long term (current) use of other agents affecting estrogen receptors and estrogen levels: Secondary | ICD-10-CM | POA: Diagnosis not present

## 2018-08-04 DIAGNOSIS — Z5111 Encounter for antineoplastic chemotherapy: Secondary | ICD-10-CM | POA: Diagnosis not present

## 2018-08-04 DIAGNOSIS — Z95828 Presence of other vascular implants and grafts: Secondary | ICD-10-CM

## 2018-08-04 DIAGNOSIS — Z9079 Acquired absence of other genital organ(s): Secondary | ICD-10-CM | POA: Diagnosis not present

## 2018-08-04 DIAGNOSIS — C7951 Secondary malignant neoplasm of bone: Secondary | ICD-10-CM | POA: Diagnosis not present

## 2018-08-04 LAB — CBC WITH DIFFERENTIAL (CANCER CENTER ONLY)
Abs Immature Granulocytes: 0.04 10*3/uL (ref 0.00–0.07)
Basophils Absolute: 0 10*3/uL (ref 0.0–0.1)
Basophils Relative: 1 %
Eosinophils Absolute: 0.1 10*3/uL (ref 0.0–0.5)
Eosinophils Relative: 2 %
HCT: 31.3 % — ABNORMAL LOW (ref 39.0–52.0)
Hemoglobin: 9.8 g/dL — ABNORMAL LOW (ref 13.0–17.0)
Immature Granulocytes: 1 %
Lymphocytes Relative: 26 %
Lymphs Abs: 1.9 10*3/uL (ref 0.7–4.0)
MCH: 27.5 pg (ref 26.0–34.0)
MCHC: 31.3 g/dL (ref 30.0–36.0)
MCV: 87.9 fL (ref 80.0–100.0)
Monocytes Absolute: 0.6 10*3/uL (ref 0.1–1.0)
Monocytes Relative: 8 %
Neutro Abs: 4.6 10*3/uL (ref 1.7–7.7)
Neutrophils Relative %: 62 %
PLATELETS: 308 10*3/uL (ref 150–400)
RBC: 3.56 MIL/uL — ABNORMAL LOW (ref 4.22–5.81)
RDW: 14.6 % (ref 11.5–15.5)
WBC Count: 7.4 10*3/uL (ref 4.0–10.5)
nRBC: 0 % (ref 0.0–0.2)

## 2018-08-04 LAB — CMP (CANCER CENTER ONLY)
ALT: 7 U/L (ref 0–44)
AST: 11 U/L — ABNORMAL LOW (ref 15–41)
Albumin: 3 g/dL — ABNORMAL LOW (ref 3.5–5.0)
Alkaline Phosphatase: 230 U/L — ABNORMAL HIGH (ref 38–126)
Anion gap: 10 (ref 5–15)
BILIRUBIN TOTAL: 0.4 mg/dL (ref 0.3–1.2)
BUN: 15 mg/dL (ref 8–23)
CO2: 22 mmol/L (ref 22–32)
Calcium: 9.1 mg/dL (ref 8.9–10.3)
Chloride: 106 mmol/L (ref 98–111)
Creatinine: 0.89 mg/dL (ref 0.61–1.24)
GFR, Est AFR Am: >60 mL/min (ref >60)
GFR, Estimated: >60 mL/min (ref >60)
Glucose, Bld: 136 mg/dL — ABNORMAL HIGH (ref 70–99)
POTASSIUM: 3.9 mmol/L (ref 3.5–5.1)
Sodium: 138 mmol/L (ref 135–145)
TOTAL PROTEIN: 6.6 g/dL (ref 6.5–8.1)

## 2018-08-04 MED ORDER — DEXAMETHASONE SODIUM PHOSPHATE 10 MG/ML IJ SOLN
INTRAMUSCULAR | Status: AC
  Filled 2018-08-04: qty 1

## 2018-08-04 MED ORDER — SODIUM CHLORIDE 0.9% FLUSH
10.0000 mL | INTRAVENOUS | Status: DC | PRN
  Administered 2018-08-04: 10 mL via INTRAVENOUS
  Filled 2018-08-04: qty 10

## 2018-08-04 MED ORDER — SODIUM CHLORIDE 0.9 % IV SOLN
20.0000 mg/m2 | Freq: Once | INTRAVENOUS | Status: AC
  Administered 2018-08-04: 40 mg via INTRAVENOUS
  Filled 2018-08-04: qty 4

## 2018-08-04 MED ORDER — LEUPROLIDE ACETATE (4 MONTH) 30 MG IM KIT
30.0000 mg | PACK | Freq: Once | INTRAMUSCULAR | Status: AC
  Administered 2018-08-04: 30 mg via INTRAMUSCULAR
  Filled 2018-08-04: qty 30

## 2018-08-04 MED ORDER — SODIUM CHLORIDE 0.9 % IV SOLN
Freq: Once | INTRAVENOUS | Status: AC
  Administered 2018-08-04: 10:00:00 via INTRAVENOUS
  Filled 2018-08-04: qty 250

## 2018-08-04 MED ORDER — SODIUM CHLORIDE 0.9% FLUSH
10.0000 mL | INTRAVENOUS | Status: DC | PRN
  Administered 2018-08-04: 10 mL
  Filled 2018-08-04: qty 10

## 2018-08-04 MED ORDER — DEXAMETHASONE SODIUM PHOSPHATE 10 MG/ML IJ SOLN
10.0000 mg | Freq: Once | INTRAMUSCULAR | Status: AC
  Administered 2018-08-04: 10 mg via INTRAVENOUS

## 2018-08-04 MED ORDER — FAMOTIDINE IN NACL 20-0.9 MG/50ML-% IV SOLN
20.0000 mg | Freq: Once | INTRAVENOUS | Status: AC
  Administered 2018-08-04: 20 mg via INTRAVENOUS

## 2018-08-04 MED ORDER — FAMOTIDINE IN NACL 20-0.9 MG/50ML-% IV SOLN
INTRAVENOUS | Status: AC
  Filled 2018-08-04: qty 50

## 2018-08-04 MED ORDER — DIPHENHYDRAMINE HCL 50 MG/ML IJ SOLN
25.0000 mg | Freq: Once | INTRAMUSCULAR | Status: AC
  Administered 2018-08-04: 25 mg via INTRAVENOUS

## 2018-08-04 MED ORDER — DIPHENHYDRAMINE HCL 50 MG/ML IJ SOLN
INTRAMUSCULAR | Status: AC
  Filled 2018-08-04: qty 1

## 2018-08-04 MED ORDER — HEPARIN SOD (PORK) LOCK FLUSH 100 UNIT/ML IV SOLN
500.0000 [IU] | Freq: Once | INTRAVENOUS | Status: AC | PRN
  Administered 2018-08-04: 500 [IU]
  Filled 2018-08-04: qty 5

## 2018-08-04 MED ORDER — SODIUM CHLORIDE 0.9 % IV SOLN: 10.0000 mg | Freq: Once | INTRAVENOUS | Status: DC

## 2018-08-04 NOTE — Patient Instructions (Signed)
[facility] Discharge Instructions for Patients Receiving Chemotherapy  Today you received the following chemotherapy agents: Jevtana.  To help prevent nausea and vomiting after your treatment, we encourage you to take your nausea medication as prescribed.   If you develop nausea and vomiting that is not controlled by your nausea medication, call the clinic.   BELOW ARE SYMPTOMS THAT SHOULD BE REPORTED IMMEDIATELY:  *FEVER GREATER THAN 100.5 F  *CHILLS WITH OR WITHOUT FEVER  NAUSEA AND VOMITING THAT IS NOT CONTROLLED WITH YOUR NAUSEA MEDICATION  *UNUSUAL SHORTNESS OF BREATH  *UNUSUAL BRUISING OR BLEEDING  TENDERNESS IN MOUTH AND THROAT WITH OR WITHOUT PRESENCE OF ULCERS  *URINARY PROBLEMS  *BOWEL PROBLEMS  UNUSUAL RASH Items with * indicate a potential emergency and should be followed up as soon as possible.  Feel free to call the clinic should you have any questions or concerns. The clinic phone number is [phone].  Please show the [facility] at check-in to the Emergency Department and triage nurse.  Cabazitaxel injection What is this medicine? CABAZITAXEL (ka baz i TAX el) is a chemotherapy drug. It is used to treat prostate cancer. It targets fast dividing cells, like cancer cells, and causes these cells to die. This medicine may be used for other purposes; ask your health care provider or pharmacist if you have questions. COMMON BRAND NAME(S): Jevtana What should I tell my health care provider before I take this medicine? They need to know if you have any of these conditions: -history of stomach bleeding -kidney disease -liver disease -low blood counts, like low white cell, platelet, or red cell counts -lung or breathing disease, like asthma -recent or ongoing radiation therapy -take medicines that treat or prevent blood clots -an unusual or allergic reaction to cabazitaxel, polysorbate 80, other medicines, foods, dyes, or  preservatives -pregnant or trying to get pregnant -breast-feeding How should I use this medicine? This medicine is for infusion into a vein. It is given by a health care professional in a hospital or clinic setting. Talk to your pediatrician regarding the use of this medicine in children. Special care may be needed. Overdosage: If you think you have taken too much of this medicine contact a poison control center or emergency room at once. NOTE: This medicine is only for you. Do not share this medicine with others. What if I miss a dose? It is important not to miss your dose. Call your doctor or health care professional if you are unable to keep an appointment. What may interact with this medicine? -antiviral medicines for HIV or AIDS -clarithromycin -medicines for fungal infections like ketoconazole, fluconazole, itraconazole, and voriconazole -nefazodone -telithromycin This list may not describe all possible interactions. Give your health care provider a list of all the medicines, herbs, non-prescription drugs, or dietary supplements you use. Also tell them if you smoke, drink alcohol, or use illegal drugs. Some items may interact with your medicine. What should I watch for while using this medicine? Your condition will be monitored carefully while you are receiving this medicine. This drug may make you feel generally unwell. This is not uncommon, as chemotherapy can affect healthy cells as well as cancer cells. Report any side effects. Continue your course of treatment even though you feel ill unless your doctor tells you to stop. Call your doctor or health care professional for advice if you get a fever, chills or sore throat, or other symptoms of a cold or flu. Do not treat yourself. This  drug decreases your body's ability to fight infections. Try to avoid being around people who are sick. This medicine may increase your risk to bruise or bleed. Call your doctor or health care professional  if you notice any unusual bleeding. Be careful brushing and flossing your teeth or using a toothpick because you may get an infection or bleed more easily. If you have any dental work done, tell your dentist you are receiving this medicine. Avoid taking products that contain aspirin, acetaminophen, ibuprofen, naproxen, or ketoprofen unless instructed by your doctor. These medicines may hide a fever. Do not become pregnant while taking this medicine. Women should inform their doctor if they wish to become pregnant or think they might be pregnant. Men should not father a child while taking this medicine and for 3 months after stopping it. There is a potential for serious side effects to an unborn child. Talk to your health care professional or pharmacist for more information. Do not breast-feed an infant while taking this medicine. What side effects may I notice from receiving this medicine? Side effects that you should report to your doctor or health care professional as soon as possible: -allergic reactions like skin rash, itching or hives, swelling of the face, lips, or tongue -blood in the urine -breathing problems -constipation -dark urine -diarrhea -pain in the lower back or side -pain, tingling, numbness in the hands or feet -pain when urinating -severe abdominal pain -signs of infection - fever or chills, cough, sore throat, pain or difficulty passing urine -signs and symptoms of kidney injury like trouble passing urine or change in the amount of urine -signs of decreased platelets or bleeding - bruising, pinpoint red spots on the skin, black, tarry stools, blood in the urine -signs of decreased red blood cells - unusually weak or tired, fainting spells, lightheadedness -vomiting Side effects that usually do not require medical attention (report to your doctor or health care professional if they continue or are bothersome): -back pain -change in taste -hair loss -headache -loss of  appetite -muscle or joint pain -nausea -upset stomach This list may not describe all possible side effects. Call your doctor for medical advice about side effects. You may report side effects to FDA at 1-800-FDA-1088. Where should I keep my medicine? This drug is given in a hospital or clinic and will not be stored at home. NOTE: This sheet is a summary. It may not cover all possible information. If you have questions about this medicine, talk to your doctor, pharmacist, or health care provider.  2019 Elsevier/Gold Standard (2016-08-29 15:38:47) Leuprolide injection What is this medicine? LEUPROLIDE (loo PROE lide) is a man-made hormone. It is used to treat the symptoms of prostate cancer. This medicine may also be used to treat children with early onset of puberty. It may be used for other hormonal conditions. This medicine may be used for other purposes; ask your health care provider or pharmacist if you have questions. COMMON BRAND NAME(S): Lupron What should I tell my health care provider before I take this medicine? They need to know if you have any of these conditions: -diabetes -heart disease or previous heart attack -high blood pressure -high cholesterol -pain or difficulty passing urine -spinal cord metastasis -stroke -tobacco smoker -an unusual or allergic reaction to leuprolide, benzyl alcohol, other medicines, foods, dyes, or preservatives -pregnant or trying to get pregnant -breast-feeding How should I use this medicine? This medicine is for injection under the skin or into a muscle. You will be taught  how to prepare and give this medicine. Use exactly as directed. Take your medicine at regular intervals. Do not take your medicine more often than directed. It is important that you put your used needles and syringes in a special sharps container. Do not put them in a trash can. If you do not have a sharps container, call your pharmacist or healthcare provider to get one. A  special MedGuide will be given to you by the pharmacist with each prescription and refill. Be sure to read this information carefully each time. Talk to your pediatrician regarding the use of this medicine in children. While this medicine may be prescribed for children as young as 8 years for selected conditions, precautions do apply. Overdosage: If you think you have taken too much of this medicine contact a poison control center or emergency room at once. NOTE: This medicine is only for you. Do not share this medicine with others. What if I miss a dose? If you miss a dose, take it as soon as you can. If it is almost time for your next dose, take only that dose. Do not take double or extra doses. What may interact with this medicine? Do not take this medicine with any of the following medications: -chasteberry This medicine may also interact with the following medications: -herbal or dietary supplements, like black cohosh or DHEA -male hormones, like estrogens or progestins and birth control pills, patches, rings, or injections -male hormones, like testosterone This list may not describe all possible interactions. Give your health care provider a list of all the medicines, herbs, non-prescription drugs, or dietary supplements you use. Also tell them if you smoke, drink alcohol, or use illegal drugs. Some items may interact with your medicine. What should I watch for while using this medicine? Visit your doctor or health care professional for regular checks on your progress. During the first week, your symptoms may get worse, but then will improve as you continue your treatment. You may get hot flashes, increased bone pain, increased difficulty passing urine, or an aggravation of nerve symptoms. Discuss these effects with your doctor or health care professional, some of them may improve with continued use of this medicine. Male patients may experience a menstrual cycle or spotting during the  first 2 months of therapy with this medicine. If this continues, contact your doctor or health care professional. What side effects may I notice from receiving this medicine? Side effects that you should report to your doctor or health care professional as soon as possible: -allergic reactions like skin rash, itching or hives, swelling of the face, lips, or tongue -breathing problems -chest pain -depression or memory disorders -pain in your legs or groin -pain at site where injected -severe headache -swelling of the feet and legs -visual changes -vomiting Side effects that usually do not require medical attention (report to your doctor or health care professional if they continue or are bothersome): -breast swelling or tenderness -decrease in sex drive or performance -diarrhea -hot flashes -loss of appetite -muscle, joint, or bone pains -nausea -redness or irritation at site where injected -skin problems or acne This list may not describe all possible side effects. Call your doctor for medical advice about side effects. You may report side effects to FDA at 1-800-FDA-1088. Where should I keep my medicine? Keep out of the reach of children. Store below 25 degrees C (77 degrees F). Do not freeze. Protect from light. Do not use if it is not clear or if there  are particles present. Throw away any unused medicine after the expiration date. NOTE: This sheet is a summary. It may not cover all possible information. If you have questions about this medicine, talk to your doctor, pharmacist, or health care provider.  2019 Elsevier/Gold Standard (2016-03-24 10:54:35)

## 2018-08-05 ENCOUNTER — Telehealth: Payer: Self-pay | Admitting: *Deleted

## 2018-08-05 LAB — PROSTATE-SPECIFIC AG, SERUM (LABCORP): Prostate Specific Ag, Serum: 38.2 ng/mL — ABNORMAL HIGH (ref 0.0–4.0)

## 2018-08-05 NOTE — Telephone Encounter (Signed)
Spoke with patient. States he is doing well after his first [place]. Eating and drinking well. Denies n/v or diarrhea. Patient knows to call this office for any issues.

## 2018-08-06 ENCOUNTER — Inpatient Hospital Stay: Payer: Medicare Other

## 2018-08-06 DIAGNOSIS — Z9079 Acquired absence of other genital organ(s): Secondary | ICD-10-CM | POA: Diagnosis not present

## 2018-08-06 DIAGNOSIS — C61 Malignant neoplasm of prostate: Secondary | ICD-10-CM

## 2018-08-06 DIAGNOSIS — Z5111 Encounter for antineoplastic chemotherapy: Secondary | ICD-10-CM | POA: Diagnosis not present

## 2018-08-06 DIAGNOSIS — Z7689 Persons encountering health services in other specified circumstances: Secondary | ICD-10-CM | POA: Diagnosis not present

## 2018-08-06 DIAGNOSIS — C7951 Secondary malignant neoplasm of bone: Secondary | ICD-10-CM | POA: Diagnosis not present

## 2018-08-06 DIAGNOSIS — Z79818 Long term (current) use of other agents affecting estrogen receptors and estrogen levels: Secondary | ICD-10-CM | POA: Diagnosis not present

## 2018-08-06 MED ORDER — PEGFILGRASTIM-CBQV 6 MG/0.6ML ~~LOC~~ SOSY
PREFILLED_SYRINGE | SUBCUTANEOUS | Status: AC
  Filled 2018-08-06: qty 0.6

## 2018-08-06 MED ORDER — PEGFILGRASTIM-CBQV 6 MG/0.6ML ~~LOC~~ SOSY
6.0000 mg | PREFILLED_SYRINGE | Freq: Once | SUBCUTANEOUS | Status: AC
  Administered 2018-08-06: 6 mg via SUBCUTANEOUS

## 2018-08-06 NOTE — Patient Instructions (Signed)
Pegfilgrastim injection What is this medicine? PEGFILGRASTIM (PEG fil gra stim) is a long-acting granulocyte colony-stimulating factor that stimulates the growth of neutrophils, a type of white blood cell important in the body's fight against infection. It is used to reduce the incidence of fever and infection in patients with certain types of cancer who are receiving chemotherapy that affects the bone marrow, and to increase survival after being exposed to high doses of radiation. This medicine may be used for other purposes; ask your health care provider or pharmacist if you have questions. COMMON BRAND NAME(S): Neulasta What should I tell my health care provider before I take this medicine? They need to know if you have any of these conditions: -kidney disease -latex allergy -ongoing radiation therapy -sickle cell disease -skin reactions to acrylic adhesives (On-Body Injector only) -an unusual or allergic reaction to pegfilgrastim, filgrastim, other medicines, foods, dyes, or preservatives -pregnant or trying to get pregnant -breast-feeding How should I use this medicine? This medicine is for injection under the skin. If you get this medicine at home, you will be taught how to prepare and give the pre-filled syringe or how to use the On-body Injector. Refer to the patient Instructions for Use for detailed instructions. Use exactly as directed. Tell your healthcare provider immediately if you suspect that the On-body Injector may not have performed as intended or if you suspect the use of the On-body Injector resulted in a missed or partial dose. It is important that you put your used needles and syringes in a special sharps container. Do not put them in a trash can. If you do not have a sharps container, call your pharmacist or healthcare provider to get one. Talk to your pediatrician regarding the use of this medicine in children. While this drug may be prescribed for selected conditions,  precautions do apply. Overdosage: If you think you have taken too much of this medicine contact a poison control center or emergency room at once. NOTE: This medicine is only for you. Do not share this medicine with others. What if I miss a dose? It is important not to miss your dose. Call your doctor or health care professional if you miss your dose. If you miss a dose due to an On-body Injector failure or leakage, a new dose should be administered as soon as possible using a single prefilled syringe for manual use. What may interact with this medicine? Interactions have not been studied. Give your health care provider a list of all the medicines, herbs, non-prescription drugs, or dietary supplements you use. Also tell them if you smoke, drink alcohol, or use illegal drugs. Some items may interact with your medicine. This list may not describe all possible interactions. Give your health care provider a list of all the medicines, herbs, non-prescription drugs, or dietary supplements you use. Also tell them if you smoke, drink alcohol, or use illegal drugs. Some items may interact with your medicine. What should I watch for while using this medicine? You may need blood work done while you are taking this medicine. If you are going to need a MRI, CT scan, or other procedure, tell your doctor that you are using this medicine (On-Body Injector only). What side effects may I notice from receiving this medicine? Side effects that you should report to your doctor or health care professional as soon as possible: -allergic reactions like skin rash, itching or hives, swelling of the face, lips, or tongue -dizziness -fever -pain, redness, or irritation at site   where injected -pinpoint red spots on the skin -red or dark-brown urine -shortness of breath or breathing problems -stomach or side pain, or pain at the shoulder -swelling -tiredness -trouble passing urine or change in the amount of urine Side  effects that usually do not require medical attention (report to your doctor or health care professional if they continue or are bothersome): -bone pain -muscle pain This list may not describe all possible side effects. Call your doctor for medical advice about side effects. You may report side effects to FDA at 1-800-FDA-1088. Where should I keep my medicine? Keep out of the reach of children. Store pre-filled syringes in a refrigerator between 2 and 8 degrees C (36 and 46 degrees F). Do not freeze. Keep in carton to protect from light. Throw away this medicine if it is left out of the refrigerator for more than 48 hours. Throw away any unused medicine after the expiration date. NOTE: This sheet is a summary. It may not cover all possible information. If you have questions about this medicine, talk to your doctor, pharmacist, or health care provider.  2018 Elsevier/Gold Standard (2016-07-31 12:58:03)  

## 2018-08-17 ENCOUNTER — Ambulatory Visit (HOSPITAL_COMMUNITY)
Admission: RE | Admit: 2018-08-17 | Discharge: 2018-08-17 | Disposition: A | Discharge status: Home / Self Care | Payer: Medicare Other | Source: Ambulatory Visit | Attending: Oncology | Admitting: Oncology

## 2018-08-17 DIAGNOSIS — C61 Malignant neoplasm of prostate: Secondary | ICD-10-CM | POA: Diagnosis not present

## 2018-08-17 MED ORDER — TECHNETIUM TC 99M MEDRONATE IV KIT
21.4000 | PACK | Freq: Once | INTRAVENOUS | Status: AC | PRN
  Administered 2018-08-17: 21.4 via INTRAVENOUS

## 2018-08-26 ENCOUNTER — Telehealth: Payer: Self-pay

## 2018-08-26 ENCOUNTER — Inpatient Hospital Stay: Payer: Medicare Other

## 2018-08-26 ENCOUNTER — Inpatient Hospital Stay: Payer: Medicare Other | Attending: Oncology | Admitting: Oncology

## 2018-08-26 VITALS — BP 116/60 | HR 81 | Temp 97.8°F | Resp 18 | Ht 69.0 in | Wt 178.2 lb

## 2018-08-26 DIAGNOSIS — Z5111 Encounter for antineoplastic chemotherapy: Secondary | ICD-10-CM | POA: Insufficient documentation

## 2018-08-26 DIAGNOSIS — C61 Malignant neoplasm of prostate: Secondary | ICD-10-CM

## 2018-08-26 DIAGNOSIS — Z7982 Long term (current) use of aspirin: Secondary | ICD-10-CM | POA: Diagnosis not present

## 2018-08-26 DIAGNOSIS — Z79818 Long term (current) use of other agents affecting estrogen receptors and estrogen levels: Secondary | ICD-10-CM

## 2018-08-26 DIAGNOSIS — Z79899 Other long term (current) drug therapy: Secondary | ICD-10-CM

## 2018-08-26 DIAGNOSIS — T451X5S Adverse effect of antineoplastic and immunosuppressive drugs, sequela: Secondary | ICD-10-CM | POA: Diagnosis not present

## 2018-08-26 DIAGNOSIS — G893 Neoplasm related pain (acute) (chronic): Secondary | ICD-10-CM

## 2018-08-26 DIAGNOSIS — Z7689 Persons encountering health services in other specified circumstances: Secondary | ICD-10-CM | POA: Insufficient documentation

## 2018-08-26 DIAGNOSIS — C7951 Secondary malignant neoplasm of bone: Secondary | ICD-10-CM

## 2018-08-26 DIAGNOSIS — R630 Anorexia: Secondary | ICD-10-CM | POA: Diagnosis not present

## 2018-08-26 DIAGNOSIS — D649 Anemia, unspecified: Secondary | ICD-10-CM

## 2018-08-26 DIAGNOSIS — Z9079 Acquired absence of other genital organ(s): Secondary | ICD-10-CM | POA: Diagnosis not present

## 2018-08-26 DIAGNOSIS — D6481 Anemia due to antineoplastic chemotherapy: Secondary | ICD-10-CM

## 2018-08-26 DIAGNOSIS — K5900 Constipation, unspecified: Secondary | ICD-10-CM

## 2018-08-26 DIAGNOSIS — Z95828 Presence of other vascular implants and grafts: Secondary | ICD-10-CM

## 2018-08-26 DIAGNOSIS — R634 Abnormal weight loss: Secondary | ICD-10-CM | POA: Insufficient documentation

## 2018-08-26 LAB — CBC WITH DIFFERENTIAL (CANCER CENTER ONLY)
Abs Immature Granulocytes: 0.06 10*3/uL (ref 0.00–0.07)
Basophils Absolute: 0.1 10*3/uL (ref 0.0–0.1)
Basophils Relative: 1 %
Eosinophils Absolute: 0 10*3/uL (ref 0.0–0.5)
Eosinophils Relative: 0 %
HCT: 27.4 % — ABNORMAL LOW (ref 39.0–52.0)
Hemoglobin: 8.5 g/dL — ABNORMAL LOW (ref 13.0–17.0)
Immature Granulocytes: 1 %
Lymphocytes Relative: 24 %
Lymphs Abs: 2.2 10*3/uL (ref 0.7–4.0)
MCH: 26.9 pg (ref 26.0–34.0)
MCHC: 31 g/dL (ref 30.0–36.0)
MCV: 86.7 fL (ref 80.0–100.0)
MONO ABS: 1 10*3/uL (ref 0.1–1.0)
Monocytes Relative: 11 %
Neutro Abs: 5.7 10*3/uL (ref 1.7–7.7)
Neutrophils Relative %: 63 %
Platelet Count: 469 10*3/uL — ABNORMAL HIGH (ref 150–400)
RBC: 3.16 MIL/uL — ABNORMAL LOW (ref 4.22–5.81)
RDW: 18 % — AB (ref 11.5–15.5)
WBC Count: 9.1 10*3/uL (ref 4.0–10.5)
nRBC: 0 % (ref 0.0–0.2)

## 2018-08-26 LAB — CMP (CANCER CENTER ONLY)
ALT: <6 U/L (ref 0–44)
AST: 9 U/L — AB (ref 15–41)
Albumin: 2.9 g/dL — ABNORMAL LOW (ref 3.5–5.0)
Alkaline Phosphatase: 270 U/L — ABNORMAL HIGH (ref 38–126)
Anion gap: 7 (ref 5–15)
BUN: 11 mg/dL (ref 8–23)
CALCIUM: 8.5 mg/dL — AB (ref 8.9–10.3)
CO2: 23 mmol/L (ref 22–32)
Chloride: 108 mmol/L (ref 98–111)
Creatinine: 0.78 mg/dL (ref 0.61–1.24)
GFR, Est AFR Am: >60 mL/min (ref >60)
GFR, Estimated: >60 mL/min (ref >60)
Glucose, Bld: 131 mg/dL — ABNORMAL HIGH (ref 70–99)
Potassium: 4 mmol/L (ref 3.5–5.1)
Sodium: 138 mmol/L (ref 135–145)
Total Bilirubin: 0.4 mg/dL (ref 0.3–1.2)
Total Protein: 6.4 g/dL — ABNORMAL LOW (ref 6.5–8.1)

## 2018-08-26 MED ORDER — SODIUM CHLORIDE 0.9 % IV SOLN
Freq: Once | INTRAVENOUS | Status: AC
  Administered 2018-08-26: 13:00:00 via INTRAVENOUS
  Filled 2018-08-26: qty 250

## 2018-08-26 MED ORDER — SODIUM CHLORIDE 0.9% FLUSH
10.0000 mL | INTRAVENOUS | Status: DC | PRN
  Administered 2018-08-26: 10 mL via INTRAVENOUS
  Filled 2018-08-26: qty 10

## 2018-08-26 MED ORDER — SODIUM CHLORIDE 0.9% FLUSH
10.0000 mL | INTRAVENOUS | Status: DC | PRN
  Administered 2018-08-26: 10 mL
  Filled 2018-08-26: qty 10

## 2018-08-26 MED ORDER — DEXAMETHASONE SODIUM PHOSPHATE 10 MG/ML IJ SOLN
10.0000 mg | Freq: Once | INTRAMUSCULAR | Status: AC
  Administered 2018-08-26: 10 mg via INTRAVENOUS

## 2018-08-26 MED ORDER — HEPARIN SOD (PORK) LOCK FLUSH 100 UNIT/ML IV SOLN
500.0000 [IU] | Freq: Once | INTRAVENOUS | Status: AC | PRN
  Administered 2018-08-26: 500 [IU]
  Filled 2018-08-26: qty 5

## 2018-08-26 MED ORDER — DIPHENHYDRAMINE HCL 50 MG/ML IJ SOLN
INTRAMUSCULAR | Status: AC
  Filled 2018-08-26: qty 1

## 2018-08-26 MED ORDER — DIPHENHYDRAMINE HCL 50 MG/ML IJ SOLN
25.0000 mg | Freq: Once | INTRAMUSCULAR | Status: AC
  Administered 2018-08-26: 25 mg via INTRAVENOUS

## 2018-08-26 MED ORDER — SODIUM CHLORIDE 0.9 % IV SOLN
20.0000 mg/m2 | Freq: Once | INTRAVENOUS | Status: AC
  Administered 2018-08-26: 40 mg via INTRAVENOUS
  Filled 2018-08-26: qty 4

## 2018-08-26 MED ORDER — FAMOTIDINE IN NACL 20-0.9 MG/50ML-% IV SOLN
INTRAVENOUS | Status: AC
  Filled 2018-08-26: qty 50

## 2018-08-26 MED ORDER — FAMOTIDINE IN NACL 20-0.9 MG/50ML-% IV SOLN
20.0000 mg | Freq: Once | INTRAVENOUS | Status: AC
  Administered 2018-08-26: 20 mg via INTRAVENOUS

## 2018-08-26 MED ORDER — DEXAMETHASONE SODIUM PHOSPHATE 10 MG/ML IJ SOLN
INTRAMUSCULAR | Status: AC
  Filled 2018-08-26: qty 1

## 2018-08-26 NOTE — Progress Notes (Signed)
Hematology and Oncology Follow Up Visit  Alec Harris [id number] 1945/09/14 72 y.o. 08/26/2018 11:59 AM Patient, No Pcp Alec Saas, MD   Principle Diagnosis: 73 year old man with advanced prostate cancer with disease to the bone that is currently castration-resistant diagnosed in 2013.   Prior Therapy:  He underwent a prostatectomy with a pathology that showed prostatic adenocarcinoma. Gleason score 4+4 equals 8, with the tumor involving the margins and the pathologic staging was pT4 N1 with 1 of 2 left pelvic lymph nodes involved.   He was started on adjuvant hormone therapy with Lupron. He developed a rising PSA and bone metastasis.   He was then treated with ketoconazole and prednisone. He developed a rising PSA with a doubling time of just over 6 months. Bone scan in September 2013 showed increased uptake in the right lower lumbar spine at L4 and L5 and abnormal uptake in the mid sternum which had progressed since the prior bone scan.  Zytiga 1000 mg daily started in October 2013.  Discontinued in October 2018 after progression of disease.  Taxotere chemotherapy at 75 mg per meter square started on 06/02/2017.  The dose was reduced to 60 mg/m starting with cycle 3.  He completed 10 cycles of therapy and April 2019.   Xtandi 160 mg daily started in August 2019.  Therapy discontinued in December 2019 after progression of disease.   Current therapy:   Jevtana chemotherapy started on August 04, 2018.  He received 20 mg/m will be repeated every 3 weeks.  He is here for cycle 2.   Lupron 30 mg every 4 months with the next injection scheduled for April 2020.    Interim History: Alec Harris is here for a repeat evaluation.  Since the last visit, he reports no major changes in his health.  He tolerated the first cycle of chemotherapy without any complaints.  His bone pain has improved spontaneously after chemotherapy.  He denies any nausea, fatigue or diarrhea.  He did have some  constipation issues resolved by Senokot.  He is ambulating without any difficulties.  Performance status and activity level remains unchanged.  He denies any headaches, blurred vision or seizures.  He denies any confusion or dizziness. He denies any fevers, chills or sweats.  He denied any chest pain, palpitation or leg edema.  He denies any cough, wheezing or hemoptysis.  He denies any nausea, vomiting or early satiety.  He denies any constipation or diarrhea.  Does not report any frequency urgency or hesitancy.  Denies any arthralgias or myalgias.  He does not report ecchymosis or petechiae.  He denies any anxiety or depression.  Denies any heat or cold intolerance.  Remainder of his review of systems is negative.   Medications:   Reviewed today with any changes. Current Outpatient Medications  Medication Sig Dispense Refill  . amLODipine (NORVASC) 5 MG tablet TAKE 1 TABLET (5 MG TOTAL) BY MOUTH DAILY. 90 tablet 3  . aspirin EC 81 MG tablet Take 81 mg by mouth daily.    [name] atorvastatin (LIPITOR) 20 MG tablet Take 1 tablet (20 mg total) by mouth daily. 90 tablet 1  . Calcium Carb-Cholecalciferol (CALCIUM 500 +D) 500-400 MG-UNIT TABS Take 1 tablet by mouth daily.     . cilostazol (PLETAL) 100 MG tablet Take 1 tablet (100 mg total) by mouth 2 (two) times daily. 180 tablet 3  . lidocaine-prilocaine (EMLA) cream Apply 1 application topically as needed. 30 g 0  . prochlorperazine (COMPAZINE) 10 MG tablet  Take 1 tablet (10 mg total) by mouth every 6 (six) hours as needed for nausea or vomiting. 30 tablet 0  . XTANDI 40 MG capsule TAKE 4 CAPSULES (160 MG TOTAL) BY MOUTH DAILY. 120 capsule 0   No current facility-administered medications for this visit.    Facility-Administered Medications Ordered in Other Visits  Medication Dose Route Frequency Provider Last Rate Last Dose  . sodium chloride flush (NS) 0.9 % injection 10 mL  10 mL Intravenous PRN Alec Portela, MD   10 mL at 08/26/18 1155   His past  medical history, family history and social history reviewed today without any noted changes.  Allergies: No Known Allergies   Physical Exam: Blood pressure 116/60, pulse 81, temperature 97.8 F (36.6 C), temperature source Oral, resp. rate 18, height 5\' 9"  (1.753 m), weight 178 lb 3.2 oz (80.8 kg), SpO2 100 %.    ECOG: 1   General appearance: Comfortable appearing without any discomfort Head: Normocephalic without any trauma Oropharynx: Mucous membranes are moist and pink without any thrush or ulcers. Eyes: Pupils are equal and round reactive to light. Lymph nodes: No cervical, supraclavicular, inguinal or axillary lymphadenopathy.   Heart:regular rate and rhythm.  S1 and S2 without leg edema. Lung: Clear without any rhonchi or wheezes.  No dullness to percussion. Abdomin: Soft, nontender, nondistended with good bowel sounds.  No hepatosplenomegaly. Musculoskeletal: No joint deformity or effusion.  Full range of motion noted. Neurological: No deficits noted on motor, sensory and deep tendon reflex exam. Skin: No petechial rash or dryness.  Appeared moist.       Lab Results: Lab Results  Component Value Date   WBC 7.4 08/04/2018   HGB 9.8 (L) 08/04/2018   HCT 31.3 (L) 08/04/2018   MCV 87.9 08/04/2018   PLT 308 08/04/2018     Chemistry      Component Value Date/Time   NA 138 08/04/2018 0811   NA 137 08/05/2017 0839   K 3.9 08/04/2018 0811   K 3.9 08/05/2017 0839   CL 106 08/04/2018 0811   CL 106 02/01/2013 1120   CO2 22 08/04/2018 0811   CO2 23 08/05/2017 0839   BUN 15 08/04/2018 0811   BUN 12.2 08/05/2017 0839   CREATININE 0.89 08/04/2018 0811   CREATININE 0.9 08/05/2017 0839      Component Value Date/Time   CALCIUM 9.1 08/04/2018 0811   CALCIUM 8.6 08/05/2017 0839   ALKPHOS 230 (H) 08/04/2018 0811   ALKPHOS 146 08/05/2017 0839   AST 11 (L) 08/04/2018 0811   AST 7 08/05/2017 0839   ALT 7 08/04/2018 0811   ALT <6 08/05/2017 0839   BILITOT 0.4 08/04/2018  0811   BILITOT 0.46 08/05/2017 0839       Results for Alec Harris (MRN [id number]) as of 08/26/2018 12:03  Ref. Range 07/26/2018 15:22 08/04/2018 08:11  Prostate Specific Ag, Serum Latest Ref Range: 0.0 - 4.0 ng/mL 34.1 (H) 38.2 (H)    CLINICAL DATA:  73 year old male with prostate cancer metastatic to bone.  EXAM: NUCLEAR MEDICINE WHOLE BODY BONE SCAN  TECHNIQUE: Whole body anterior and posterior images were obtained approximately 3 hours after intravenous injection of radiopharmaceutical.  RADIOPHARMACEUTICALS:  21.4 mCi Technetium-13m MDP IV  COMPARISON:  Most recent prior nuclear medicine bone scan 05/18/2017  FINDINGS: Similar pattern of diffuse multicentric radiotracer uptake throughout the skeleton. There are several areas where the amount of radiotracer uptake has increased including in the right clavicle, right femoral diaphysis, distal sternal body,  L3 vertebral body, and right fourth rib.  Several new areas of radiotracer uptake are now evident including a small focus in the right lateral calvarium, a moderate focus in the sternal manubrium, new foci in the right third and sixth ribs, several new foci in the right femoral neck and intertrochanteric region, a new focus in the posterior right iliac bone, posterior right sacrum and a small focus anteriorly at the left anterior superior iliac spine.  Other areas demonstrate decreased radiotracer activity compared to prior including the right humeral diaphysis and the left proximal femur.  IMPRESSION: Overall, the imaging findings suggest continued progression of metastatic disease. There are multiple areas of increased radiotracer uptake at sites of previous metastatic disease as well as multiple sites of new uptake which were not present previously as detailed above.  However, the response is mixed and there are at least 2 areas that demonstrate decreased radiotracer activity at sites of  prior metastatic disease including the right mid humeral diaphysis and the proximal left femur.     Impression and Plan:   73 year old man with:  1.  Advanced prostate cancer is currently castration resistant.  He has developed progression of disease with bone involvement based on bone scan and PSA criteria in December 2019.  He is currently receiving Jevtana chemotherapy without complications.  He tolerated the first cycle with improvement in his clinical status.  Risks and benefits of continuing this therapy long-term was reviewed today.  Long-term complications including peripheral neuropathy and myelosuppression were reviewed.  He is agreeable to continue given his excellent benefit.  Bone scan obtained on August 17, 2018 was personally reviewed and discussed with the patient today.  The plan is to proceed with close to 10 cycles of chemotherapy as long as he tolerates it.     2. Androgen deprivation.  He will remain on Lupron 30 mg every 4 months.  Next injection scheduled in April 2020.  3. Bone directed therapy.  I recommended continuing calcium and vitamin D supplements.  He has deferred Xgeva in the past.  4. Anemia: His hemoglobin is down to 8.5 related to chemotherapy and malignancy.  No need for transfusion we will continue to monitor this moving forward.  5. Bone pain: Improved with chemotherapy.  Does not require any additional pain medication.  6. IV access: Port-A-Cath remains in use without any complications.  7.  Prognosis and goals of care: Therapy remains palliative and his disease is incurable.  Aggressive therapy is warranted given his excellent performance status.  8. Followup.  In 3 weeks for the next cycle of chemotherapy.  25 minutes was spent face-to-face with the patient today.  More than 50% was spent on reviewing laboratory data, imaging studies addressing complications related to therapy and future plan of care.  Zola Button, MD 1/9/202011:59 AM

## 2018-08-26 NOTE — Patient Instructions (Signed)
[facility] Discharge Instructions for Patients Receiving Chemotherapy  Today you received the following chemotherapy agents: Jevtana.  To help prevent nausea and vomiting after your treatment, we encourage you to take your nausea medication as prescribed.   If you develop nausea and vomiting that is not controlled by your nausea medication, call the clinic.   BELOW ARE SYMPTOMS THAT SHOULD BE REPORTED IMMEDIATELY:  *FEVER GREATER THAN 100.5 F  *CHILLS WITH OR WITHOUT FEVER  NAUSEA AND VOMITING THAT IS NOT CONTROLLED WITH YOUR NAUSEA MEDICATION  *UNUSUAL SHORTNESS OF BREATH  *UNUSUAL BRUISING OR BLEEDING  TENDERNESS IN MOUTH AND THROAT WITH OR WITHOUT PRESENCE OF ULCERS  *URINARY PROBLEMS  *BOWEL PROBLEMS  UNUSUAL RASH Items with * indicate a potential emergency and should be followed up as soon as possible.  Feel free to call the clinic should you have any questions or concerns. The clinic phone number is [phone].  Please show the [facility] at check-in to the Emergency Department and triage nurse.  Cabazitaxel injection What is this medicine? CABAZITAXEL (ka baz i TAX el) is a chemotherapy drug. It is used to treat prostate cancer. It targets fast dividing cells, like cancer cells, and causes these cells to die. This medicine may be used for other purposes; ask your health care provider or pharmacist if you have questions. COMMON BRAND NAME(S): Jevtana What should I tell my health care provider before I take this medicine? They need to know if you have any of these conditions: -history of stomach bleeding -kidney disease -liver disease -low blood counts, like low white cell, platelet, or red cell counts -lung or breathing disease, like asthma -recent or ongoing radiation therapy -take medicines that treat or prevent blood clots -an unusual or allergic reaction to cabazitaxel, polysorbate 80, other medicines, foods, dyes, or  preservatives -pregnant or trying to get pregnant -breast-feeding How should I use this medicine? This medicine is for infusion into a vein. It is given by a health care professional in a hospital or clinic setting. Talk to your pediatrician regarding the use of this medicine in children. Special care may be needed. Overdosage: If you think you have taken too much of this medicine contact a poison control center or emergency room at once. NOTE: This medicine is only for you. Do not share this medicine with others. What if I miss a dose? It is important not to miss your dose. Call your doctor or health care professional if you are unable to keep an appointment. What may interact with this medicine? -antiviral medicines for HIV or AIDS -clarithromycin -medicines for fungal infections like ketoconazole, fluconazole, itraconazole, and voriconazole -nefazodone -telithromycin This list may not describe all possible interactions. Give your health care provider a list of all the medicines, herbs, non-prescription drugs, or dietary supplements you use. Also tell them if you smoke, drink alcohol, or use illegal drugs. Some items may interact with your medicine. What should I watch for while using this medicine? Your condition will be monitored carefully while you are receiving this medicine. This drug may make you feel generally unwell. This is not uncommon, as chemotherapy can affect healthy cells as well as cancer cells. Report any side effects. Continue your course of treatment even though you feel ill unless your doctor tells you to stop. Call your doctor or health care professional for advice if you get a fever, chills or sore throat, or other symptoms of a cold or flu. Do not treat yourself. This  drug decreases your body's ability to fight infections. Try to avoid being around people who are sick. This medicine may increase your risk to bruise or bleed. Call your doctor or health care professional  if you notice any unusual bleeding. Be careful brushing and flossing your teeth or using a toothpick because you may get an infection or bleed more easily. If you have any dental work done, tell your dentist you are receiving this medicine. Avoid taking products that contain aspirin, acetaminophen, ibuprofen, naproxen, or ketoprofen unless instructed by your doctor. These medicines may hide a fever. Do not become pregnant while taking this medicine. Women should inform their doctor if they wish to become pregnant or think they might be pregnant. Men should not father a child while taking this medicine and for 3 months after stopping it. There is a potential for serious side effects to an unborn child. Talk to your health care professional or pharmacist for more information. Do not breast-feed an infant while taking this medicine. What side effects may I notice from receiving this medicine? Side effects that you should report to your doctor or health care professional as soon as possible: -allergic reactions like skin rash, itching or hives, swelling of the face, lips, or tongue -blood in the urine -breathing problems -constipation -dark urine -diarrhea -pain in the lower back or side -pain, tingling, numbness in the hands or feet -pain when urinating -severe abdominal pain -signs of infection - fever or chills, cough, sore throat, pain or difficulty passing urine -signs and symptoms of kidney injury like trouble passing urine or change in the amount of urine -signs of decreased platelets or bleeding - bruising, pinpoint red spots on the skin, black, tarry stools, blood in the urine -signs of decreased red blood cells - unusually weak or tired, fainting spells, lightheadedness -vomiting Side effects that usually do not require medical attention (report to your doctor or health care professional if they continue or are bothersome): -back pain -change in taste -hair loss -headache -loss of  appetite -muscle or joint pain -nausea -upset stomach This list may not describe all possible side effects. Call your doctor for medical advice about side effects. You may report side effects to FDA at 1-800-FDA-1088. Where should I keep my medicine? This drug is given in a hospital or clinic and will not be stored at home. NOTE: This sheet is a summary. It may not cover all possible information. If you have questions about this medicine, talk to your doctor, pharmacist, or health care provider.  2019 Elsevier/Gold Standard (2016-08-29 15:38:47)

## 2018-08-26 NOTE — Telephone Encounter (Signed)
Printed avs and calender of upcoming appointment. Per 1/9 los 

## 2018-08-27 ENCOUNTER — Telehealth: Payer: Self-pay

## 2018-08-27 LAB — PROSTATE-SPECIFIC AG, SERUM (LABCORP): Prostate Specific Ag, Serum: 33.5 ng/mL — ABNORMAL HIGH (ref 0.0–4.0)

## 2018-08-27 NOTE — Telephone Encounter (Signed)
Contacted patient and made aware of results. 

## 2018-08-27 NOTE — Telephone Encounter (Signed)
-----   Message from Alec Portela, MD sent at 08/27/2018  8:00 AM EST ----- Please let him know his PSA is down.

## 2018-08-28 ENCOUNTER — Inpatient Hospital Stay: Payer: Medicare Other

## 2018-08-28 VITALS — BP 136/70 | HR 82 | Temp 98.1°F | Resp 18

## 2018-08-28 DIAGNOSIS — Z5111 Encounter for antineoplastic chemotherapy: Secondary | ICD-10-CM | POA: Diagnosis not present

## 2018-08-28 DIAGNOSIS — Z9079 Acquired absence of other genital organ(s): Secondary | ICD-10-CM | POA: Diagnosis not present

## 2018-08-28 DIAGNOSIS — Z79818 Long term (current) use of other agents affecting estrogen receptors and estrogen levels: Secondary | ICD-10-CM | POA: Diagnosis not present

## 2018-08-28 DIAGNOSIS — C7951 Secondary malignant neoplasm of bone: Secondary | ICD-10-CM | POA: Diagnosis not present

## 2018-08-28 DIAGNOSIS — Z7689 Persons encountering health services in other specified circumstances: Secondary | ICD-10-CM | POA: Diagnosis not present

## 2018-08-28 DIAGNOSIS — C61 Malignant neoplasm of prostate: Secondary | ICD-10-CM | POA: Diagnosis not present

## 2018-08-28 MED ORDER — PEGFILGRASTIM-CBQV 6 MG/0.6ML ~~LOC~~ SOSY
6.0000 mg | PREFILLED_SYRINGE | Freq: Once | SUBCUTANEOUS | Status: AC
  Administered 2018-08-28: 6 mg via SUBCUTANEOUS

## 2018-08-28 MED ORDER — PEGFILGRASTIM-CBQV 6 MG/0.6ML ~~LOC~~ SOSY
PREFILLED_SYRINGE | SUBCUTANEOUS | Status: AC
  Filled 2018-08-28: qty 0.6

## 2018-09-16 ENCOUNTER — Telehealth: Payer: Self-pay | Admitting: Oncology

## 2018-09-16 ENCOUNTER — Inpatient Hospital Stay: Payer: Medicare Other

## 2018-09-16 ENCOUNTER — Inpatient Hospital Stay (HOSPITAL_BASED_OUTPATIENT_CLINIC_OR_DEPARTMENT_OTHER): Payer: Medicare Other | Admitting: Oncology

## 2018-09-16 VITALS — BP 134/89 | HR 82 | Temp 97.9°F | Resp 18 | Ht 69.0 in | Wt 174.8 lb

## 2018-09-16 DIAGNOSIS — Z79818 Long term (current) use of other agents affecting estrogen receptors and estrogen levels: Secondary | ICD-10-CM | POA: Diagnosis not present

## 2018-09-16 DIAGNOSIS — Z95828 Presence of other vascular implants and grafts: Secondary | ICD-10-CM

## 2018-09-16 DIAGNOSIS — D6481 Anemia due to antineoplastic chemotherapy: Secondary | ICD-10-CM | POA: Diagnosis not present

## 2018-09-16 DIAGNOSIS — T451X5S Adverse effect of antineoplastic and immunosuppressive drugs, sequela: Secondary | ICD-10-CM | POA: Diagnosis not present

## 2018-09-16 DIAGNOSIS — Z7689 Persons encountering health services in other specified circumstances: Secondary | ICD-10-CM | POA: Diagnosis not present

## 2018-09-16 DIAGNOSIS — Z7982 Long term (current) use of aspirin: Secondary | ICD-10-CM | POA: Diagnosis not present

## 2018-09-16 DIAGNOSIS — R634 Abnormal weight loss: Secondary | ICD-10-CM

## 2018-09-16 DIAGNOSIS — Z9079 Acquired absence of other genital organ(s): Secondary | ICD-10-CM | POA: Diagnosis not present

## 2018-09-16 DIAGNOSIS — C61 Malignant neoplasm of prostate: Secondary | ICD-10-CM | POA: Diagnosis not present

## 2018-09-16 DIAGNOSIS — Z79899 Other long term (current) drug therapy: Secondary | ICD-10-CM | POA: Diagnosis not present

## 2018-09-16 DIAGNOSIS — R630 Anorexia: Secondary | ICD-10-CM | POA: Diagnosis not present

## 2018-09-16 DIAGNOSIS — C7951 Secondary malignant neoplasm of bone: Secondary | ICD-10-CM

## 2018-09-16 DIAGNOSIS — D649 Anemia, unspecified: Secondary | ICD-10-CM

## 2018-09-16 DIAGNOSIS — Z5111 Encounter for antineoplastic chemotherapy: Secondary | ICD-10-CM | POA: Diagnosis not present

## 2018-09-16 LAB — CBC WITH DIFFERENTIAL (CANCER CENTER ONLY)
Abs Immature Granulocytes: 0.06 10*3/uL (ref 0.00–0.07)
Basophils Absolute: 0.1 10*3/uL (ref 0.0–0.1)
Basophils Relative: 1 %
Eosinophils Absolute: 0.1 10*3/uL (ref 0.0–0.5)
Eosinophils Relative: 1 %
HCT: 26.9 % — ABNORMAL LOW (ref 39.0–52.0)
Hemoglobin: 8.4 g/dL — ABNORMAL LOW (ref 13.0–17.0)
Immature Granulocytes: 1 %
Lymphocytes Relative: 19 %
Lymphs Abs: 1.9 10*3/uL (ref 0.7–4.0)
MCH: 26 pg (ref 26.0–34.0)
MCHC: 31.2 g/dL (ref 30.0–36.0)
MCV: 83.3 fL (ref 80.0–100.0)
Monocytes Absolute: 1.2 10*3/uL — ABNORMAL HIGH (ref 0.1–1.0)
Monocytes Relative: 12 %
NEUTROS ABS: 6.6 10*3/uL (ref 1.7–7.7)
Neutrophils Relative %: 66 %
Platelet Count: 324 10*3/uL (ref 150–400)
RBC: 3.23 MIL/uL — AB (ref 4.22–5.81)
RDW: 19.6 % — AB (ref 11.5–15.5)
WBC Count: 9.9 10*3/uL (ref 4.0–10.5)
nRBC: 0 % (ref 0.0–0.2)

## 2018-09-16 LAB — CMP (CANCER CENTER ONLY)
ALT: <6 U/L (ref 0–44)
AST: 7 U/L — ABNORMAL LOW (ref 15–41)
Albumin: 3.3 g/dL — ABNORMAL LOW (ref 3.5–5.0)
Alkaline Phosphatase: 246 U/L — ABNORMAL HIGH (ref 38–126)
Anion gap: 9 (ref 5–15)
BUN: 11 mg/dL (ref 8–23)
CO2: 22 mmol/L (ref 22–32)
Calcium: 8.8 mg/dL — ABNORMAL LOW (ref 8.9–10.3)
Chloride: 107 mmol/L (ref 98–111)
Creatinine: 0.81 mg/dL (ref 0.61–1.24)
GFR, Est AFR Am: >60 mL/min (ref >60)
GFR, Estimated: >60 mL/min (ref >60)
Glucose, Bld: 134 mg/dL — ABNORMAL HIGH (ref 70–99)
POTASSIUM: 3.9 mmol/L (ref 3.5–5.1)
Sodium: 138 mmol/L (ref 135–145)
TOTAL PROTEIN: 6.5 g/dL (ref 6.5–8.1)
Total Bilirubin: 0.4 mg/dL (ref 0.3–1.2)

## 2018-09-16 LAB — SAMPLE TO BLOOD BANK

## 2018-09-16 MED ORDER — SODIUM CHLORIDE 0.9% FLUSH
10.0000 mL | INTRAVENOUS | Status: DC | PRN
  Administered 2018-09-16: 10 mL via INTRAVENOUS
  Filled 2018-09-16: qty 10

## 2018-09-16 MED ORDER — DEXTROSE 5 % IV SOLN
20.0000 mg/m2 | Freq: Once | INTRAVENOUS | Status: DC
  Filled 2018-09-16: qty 4

## 2018-09-16 MED ORDER — HEPARIN SOD (PORK) LOCK FLUSH 100 UNIT/ML IV SOLN
500.0000 [IU] | Freq: Once | INTRAVENOUS | Status: AC | PRN
  Administered 2018-09-16: 500 [IU]
  Filled 2018-09-16: qty 5

## 2018-09-16 MED ORDER — DIPHENHYDRAMINE HCL 50 MG/ML IJ SOLN
INTRAMUSCULAR | Status: AC
  Filled 2018-09-16: qty 1

## 2018-09-16 MED ORDER — FAMOTIDINE IN NACL 20-0.9 MG/50ML-% IV SOLN
20.0000 mg | Freq: Once | INTRAVENOUS | Status: AC
  Administered 2018-09-16: 20 mg via INTRAVENOUS

## 2018-09-16 MED ORDER — SODIUM CHLORIDE 0.9 % IV SOLN
20.0000 mg/m2 | Freq: Once | INTRAVENOUS | Status: AC
  Administered 2018-09-16: 40 mg via INTRAVENOUS
  Filled 2018-09-16: qty 4

## 2018-09-16 MED ORDER — SODIUM CHLORIDE 0.9 % IV SOLN
Freq: Once | INTRAVENOUS | Status: AC
  Administered 2018-09-16: 11:00:00 via INTRAVENOUS
  Filled 2018-09-16: qty 250

## 2018-09-16 MED ORDER — FAMOTIDINE IN NACL 20-0.9 MG/50ML-% IV SOLN
INTRAVENOUS | Status: AC
  Filled 2018-09-16: qty 50

## 2018-09-16 MED ORDER — MEGESTROL ACETATE 400 MG/10ML PO SUSP: 400.0000 mg | Freq: Every day | ORAL | 0 refills | Status: DC

## 2018-09-16 MED ORDER — SODIUM CHLORIDE 0.9 % IV SOLN
10.0000 mg | Freq: Once | INTRAVENOUS | Status: AC
  Administered 2018-09-16: 10 mg via INTRAVENOUS
  Filled 2018-09-16: qty 1

## 2018-09-16 MED ORDER — SODIUM CHLORIDE 0.9% FLUSH
10.0000 mL | INTRAVENOUS | Status: DC | PRN
  Administered 2018-09-16: 10 mL
  Filled 2018-09-16: qty 10

## 2018-09-16 MED ORDER — DIPHENHYDRAMINE HCL 50 MG/ML IJ SOLN
25.0000 mg | Freq: Once | INTRAMUSCULAR | Status: AC
  Administered 2018-09-16: 25 mg via INTRAVENOUS

## 2018-09-16 MED ORDER — DEXAMETHASONE SODIUM PHOSPHATE 10 MG/ML IJ SOLN
INTRAMUSCULAR | Status: AC
  Filled 2018-09-16: qty 1

## 2018-09-16 NOTE — Patient Instructions (Signed)
[facility] Discharge Instructions for Patients Receiving Chemotherapy  Today you received the following chemotherapy agents Jevtana.  To help prevent nausea and vomiting after your treatment, we encourage you to take your nausea medication as instructed.  If you develop nausea and vomiting that is not controlled by your nausea medication, call the clinic.   BELOW ARE SYMPTOMS THAT SHOULD BE REPORTED IMMEDIATELY:  *FEVER GREATER THAN 100.5 F  *CHILLS WITH OR WITHOUT FEVER  NAUSEA AND VOMITING THAT IS NOT CONTROLLED WITH YOUR NAUSEA MEDICATION  *UNUSUAL SHORTNESS OF BREATH  *UNUSUAL BRUISING OR BLEEDING  TENDERNESS IN MOUTH AND THROAT WITH OR WITHOUT PRESENCE OF ULCERS  *URINARY PROBLEMS  *BOWEL PROBLEMS  UNUSUAL RASH Items with * indicate a potential emergency and should be followed up as soon as possible.  Feel free to call the clinic should you have any questions or concerns. The clinic phone number is [phone].  Please show the [facility] at check-in to the Emergency Department and triage nurse.

## 2018-09-16 NOTE — Telephone Encounter (Signed)
Scheduled appt per 01/30 los. ° °Printed calendar and avs. °

## 2018-09-16 NOTE — Progress Notes (Signed)
Hematology and Oncology Follow Up Visit  Alec Harris [id number] 08-Mar-1946 73 y.o. 09/16/2018 9:23 AM Patient, No Pcp PerShadad, [name], Harris   Principle Diagnosis: 30 year old man with castration-resistant prostate cancer documented in 2013.  He has disease to the bone after initial diagnosis of Gleason score 8 and radical prostatectomy in 2003.  Prior Therapy:  He underwent a prostatectomy with a pathology that showed prostatic adenocarcinoma. Gleason score 4+4 equals 8, with the tumor involving the margins and the pathologic staging was pT4 N1 with 1 of 2 left pelvic lymph nodes involved.   He was started on adjuvant hormone therapy with Lupron. He developed a rising PSA and bone metastasis.   He was then treated with ketoconazole and prednisone. He developed a rising PSA with a doubling time of just over 6 months. Bone scan in September 2013 showed increased uptake in the right lower lumbar spine at L4 and L5 and abnormal uptake in the mid sternum which had progressed since the prior bone scan.  Zytiga 1000 mg daily started in October 2013.  Discontinued in October 2018 after progression of disease.  Taxotere chemotherapy at 75 mg per meter square started on 06/02/2017.  The dose was reduced to 60 mg/m starting with cycle 3.  He completed 10 cycles of therapy and April 2019.   Xtandi 160 mg daily started in August 2019.  Therapy discontinued in December 2019 after progression of disease.   Current therapy:   Jevtana chemotherapy started on August 04, 2018.  He is here for cycle 3 of therapy and receiving 20 mg/m.   Lupron 30 mg every 4 months with the next injection scheduled for April 2020.    Interim History: Alec Harris is here for a follow-up visit.  He received the last cycle of chemotherapy without any complaints.  He has tolerated this therapy without any infusion related complications or worsening neuropathy.  He denies any diarrhea or abdominal distention.  He did  report anorexia and of lost 4 pounds.  His bone pain has improved since the start of therapy.  He denies any excessive fatigue, tiredness or dyspnea on exertion.   Patient denied any alteration mental status, neuropathy, confusion or dizziness.  Denies any headaches or lethargy.  Denies any night sweats, weight loss.  Denied orthopnea, dyspnea on exertion or chest discomfort.  Denies shortness of breath, difficulty breathing hemoptysis or cough.  Denies any abdominal distention, nausea, early satiety or dyspepsia.  Denies any hematuria, frequency, dysuria or nocturia.  Denies any skin irritation, dryness or rash.  Denies any ecchymosis or petechiae.  Denies any lymphadenopathy or clotting.  Denies any heat or cold intolerance.  Denies any anxiety or depression.  Remaining review of system is negative.     Medications:   Reviewed today with any changes. Current Outpatient Medications  Medication Sig Dispense Refill  . amLODipine (NORVASC) 5 MG tablet TAKE 1 TABLET (5 MG TOTAL) BY MOUTH DAILY. 90 tablet 3  . aspirin EC 81 MG tablet Take 81 mg by mouth daily.    [name] atorvastatin (LIPITOR) 20 MG tablet Take 1 tablet (20 mg total) by mouth daily. 90 tablet 1  . Calcium Carb-Cholecalciferol (CALCIUM 500 +D) 500-400 MG-UNIT TABS Take 1 tablet by mouth daily.     . cilostazol (PLETAL) 100 MG tablet Take 1 tablet (100 mg total) by mouth 2 (two) times daily. 180 tablet 3  . lidocaine-prilocaine (EMLA) cream Apply 1 application topically as needed. 30 g 0  . megestrol (  MEGACE) 400 MG/10ML suspension Take 10 mLs (400 mg total) by mouth daily. 240 mL 0  . prochlorperazine (COMPAZINE) 10 MG tablet Take 1 tablet (10 mg total) by mouth every 6 (six) hours as needed for nausea or vomiting. 30 tablet 0   No current facility-administered medications for this visit.    His past medical history, family history and social history reviewed today without any noted changes.  Allergies: No Known Allergies   Physical  Exam: Blood pressure 134/89, pulse 82, temperature 97.9 F (36.6 C), temperature source Oral, resp. rate 18, height 5\' 9"  (1.753 m), weight 174 lb 12.8 oz (79.3 kg), SpO2 100 %.    ECOG: 1   General appearance: Alert, awake without any distress. Head: Atraumatic without abnormalities Oropharynx: Without any thrush or ulcers. Eyes: No scleral icterus. Lymph nodes: No lymphadenopathy noted in the cervical, supraclavicular, or axillary nodes Heart:regular rate and rhythm, without any murmurs or gallops.   Lung: Clear to auscultation without any rhonchi, wheezes or dullness to percussion. Abdomin: Soft, nontender without any shifting dullness or ascites. Musculoskeletal: No clubbing or cyanosis. Neurological: No motor or sensory deficits. Skin: No rashes or lesions.       Lab Results: Lab Results  Component Value Date   WBC 9.9 09/16/2018   HGB 8.4 (L) 09/16/2018   HCT 26.9 (L) 09/16/2018   MCV 83.3 09/16/2018   PLT 324 09/16/2018     Chemistry      Component Value Date/Time   NA 138 08/26/2018 1156   NA 137 08/05/2017 0839   K 4.0 08/26/2018 1156   K 3.9 08/05/2017 0839   CL 108 08/26/2018 1156   CL 106 02/01/2013 1120   CO2 23 08/26/2018 1156   CO2 23 08/05/2017 0839   BUN 11 08/26/2018 1156   BUN 12.2 08/05/2017 0839   CREATININE 0.78 08/26/2018 1156   CREATININE 0.9 08/05/2017 0839      Component Value Date/Time   CALCIUM 8.5 (L) 08/26/2018 1156   CALCIUM 8.6 08/05/2017 0839   ALKPHOS 270 (H) 08/26/2018 1156   ALKPHOS 146 08/05/2017 0839   AST 9 (L) 08/26/2018 1156   AST 7 08/05/2017 0839   ALT <6 08/26/2018 1156   ALT <6 08/05/2017 0839   BILITOT 0.4 08/26/2018 1156   BILITOT 0.46 08/05/2017 0839      Results for Alec Harris (MRN [id number]) as of 09/16/2018 09:24  Ref. Range 08/04/2018 08:11 08/26/2018 11:56  Prostate Specific Ag, Serum Latest Ref Range: 0.0 - 4.0 ng/mL 38.2 (H) 33.5 (H)       Impression and Plan:   73 year old man  with:  1.  Castration-resistant prostate cancer with disease to the bone diagnosed in 2013.  He has progressed on therapy as outlined above.  He continues to tolerate Jevtana chemotherapy without any complications.  Risks and benefits of continuing this therapy long-term was also reviewed.  His PSA is declining and he is experiencing clinical benefit with decrease in his bone pain.  Long-term complications such as diarrhea, myelosuppression and sepsis were also discussed.  After discussion today, he is agreeable to continue.  He will receive treatment today and every 3 weeks.     2. Androgen deprivation.  Long-term complication associated with androgen deprivation was reviewed today.  These complications include weight gain and osteoporosis.  His next Lupron will be scheduled in April.  3. Bone directed therapy.  He continues to take calcium and vitamin D supplements.  He declined [place].  4. Anemia: Hemoglobin  remains stable at this time.  No transfusion is needed and will continue to monitor regarding this issue.  His anemia is related to chemotherapy and malignancy.  5. Bone pain: He does not require any pain medication at this time and improved since chemotherapy started.  6. IV access: Port-A-Cath remains in use without any issues or pain.  7.  Prognosis and goals of care: He has incurable disease but disease that can be palliated at this time.  Performance status is reasonable and aggressive therapy is warranted.  8.  Anorexia: Related to chemotherapy.  Prescription for Megace with instruction how to use it was given to him.  Complications such as deep vein thrombosis and adrenal insufficiency were also discussed.  9. Followup.  In 3 weeks for repeat evaluation prior to his next cycle of therapy.  25 minutes was spent face-to-face with the patient today.  More than 50% was spent on updating his disease status, treatment options and managing complications related to therapy.  Zola Button, Harris 1/30/20209:23 AM

## 2018-09-17 LAB — PROSTATE-SPECIFIC AG, SERUM (LABCORP): Prostate Specific Ag, Serum: 46.1 ng/mL — ABNORMAL HIGH (ref 0.0–4.0)

## 2018-09-18 ENCOUNTER — Telehealth: Payer: Self-pay

## 2018-09-18 ENCOUNTER — Inpatient Hospital Stay: Payer: Medicare Other | Attending: Oncology

## 2018-09-18 VITALS — BP 150/68 | HR 70 | Temp 98.4°F | Resp 18

## 2018-09-18 DIAGNOSIS — Z79899 Other long term (current) drug therapy: Secondary | ICD-10-CM | POA: Diagnosis not present

## 2018-09-18 DIAGNOSIS — R630 Anorexia: Secondary | ICD-10-CM | POA: Diagnosis not present

## 2018-09-18 DIAGNOSIS — D649 Anemia, unspecified: Secondary | ICD-10-CM | POA: Diagnosis not present

## 2018-09-18 DIAGNOSIS — C61 Malignant neoplasm of prostate: Secondary | ICD-10-CM | POA: Insufficient documentation

## 2018-09-18 DIAGNOSIS — Z9079 Acquired absence of other genital organ(s): Secondary | ICD-10-CM | POA: Insufficient documentation

## 2018-09-18 DIAGNOSIS — Z79818 Long term (current) use of other agents affecting estrogen receptors and estrogen levels: Secondary | ICD-10-CM | POA: Insufficient documentation

## 2018-09-18 DIAGNOSIS — C7951 Secondary malignant neoplasm of bone: Secondary | ICD-10-CM | POA: Diagnosis not present

## 2018-09-18 DIAGNOSIS — Z7689 Persons encountering health services in other specified circumstances: Secondary | ICD-10-CM | POA: Diagnosis not present

## 2018-09-18 DIAGNOSIS — Z7982 Long term (current) use of aspirin: Secondary | ICD-10-CM | POA: Insufficient documentation

## 2018-09-18 DIAGNOSIS — R5383 Other fatigue: Secondary | ICD-10-CM | POA: Diagnosis not present

## 2018-09-18 DIAGNOSIS — K5900 Constipation, unspecified: Secondary | ICD-10-CM | POA: Diagnosis not present

## 2018-09-18 DIAGNOSIS — Z5111 Encounter for antineoplastic chemotherapy: Secondary | ICD-10-CM | POA: Diagnosis not present

## 2018-09-18 MED ORDER — PEGFILGRASTIM-CBQV 6 MG/0.6ML ~~LOC~~ SOSY
6.0000 mg | PREFILLED_SYRINGE | Freq: Once | SUBCUTANEOUS | Status: AC
  Administered 2018-09-18: 6 mg via SUBCUTANEOUS

## 2018-09-18 MED ORDER — PEGFILGRASTIM-CBQV 6 MG/0.6ML ~~LOC~~ SOSY
PREFILLED_SYRINGE | SUBCUTANEOUS | Status: AC
  Filled 2018-09-18: qty 0.6

## 2018-09-18 NOTE — Telephone Encounter (Signed)
Spoke with patient and gave him a cheerful reminder he can come in at any time today instead of waiting on his 2:45pm appointment. Pt stated he will be in ASAP.

## 2018-10-07 ENCOUNTER — Inpatient Hospital Stay: Payer: Medicare Other

## 2018-10-07 ENCOUNTER — Inpatient Hospital Stay (HOSPITAL_BASED_OUTPATIENT_CLINIC_OR_DEPARTMENT_OTHER): Payer: Medicare Other | Admitting: Oncology

## 2018-10-07 ENCOUNTER — Telehealth: Payer: Self-pay | Admitting: Oncology

## 2018-10-07 VITALS — BP 144/62 | HR 79 | Temp 97.8°F | Resp 18 | Ht 69.0 in | Wt 175.9 lb

## 2018-10-07 DIAGNOSIS — Z9079 Acquired absence of other genital organ(s): Secondary | ICD-10-CM

## 2018-10-07 DIAGNOSIS — C7951 Secondary malignant neoplasm of bone: Secondary | ICD-10-CM

## 2018-10-07 DIAGNOSIS — Z7689 Persons encountering health services in other specified circumstances: Secondary | ICD-10-CM | POA: Diagnosis not present

## 2018-10-07 DIAGNOSIS — Z5111 Encounter for antineoplastic chemotherapy: Secondary | ICD-10-CM | POA: Diagnosis not present

## 2018-10-07 DIAGNOSIS — Z79818 Long term (current) use of other agents affecting estrogen receptors and estrogen levels: Secondary | ICD-10-CM | POA: Diagnosis not present

## 2018-10-07 DIAGNOSIS — C61 Malignant neoplasm of prostate: Secondary | ICD-10-CM

## 2018-10-07 DIAGNOSIS — K5900 Constipation, unspecified: Secondary | ICD-10-CM | POA: Diagnosis not present

## 2018-10-07 DIAGNOSIS — R5383 Other fatigue: Secondary | ICD-10-CM

## 2018-10-07 DIAGNOSIS — Z95828 Presence of other vascular implants and grafts: Secondary | ICD-10-CM

## 2018-10-07 DIAGNOSIS — Z7982 Long term (current) use of aspirin: Secondary | ICD-10-CM

## 2018-10-07 DIAGNOSIS — D649 Anemia, unspecified: Secondary | ICD-10-CM

## 2018-10-07 DIAGNOSIS — Z79899 Other long term (current) drug therapy: Secondary | ICD-10-CM | POA: Diagnosis not present

## 2018-10-07 DIAGNOSIS — R630 Anorexia: Secondary | ICD-10-CM | POA: Diagnosis not present

## 2018-10-07 LAB — CBC WITH DIFFERENTIAL (CANCER CENTER ONLY)
Abs Immature Granulocytes: 0.05 10*3/uL (ref 0.00–0.07)
BASOS ABS: 0.1 10*3/uL (ref 0.0–0.1)
Basophils Relative: 1 %
Eosinophils Absolute: 0.1 10*3/uL (ref 0.0–0.5)
Eosinophils Relative: 1 %
HEMATOCRIT: 26 % — AB (ref 39.0–52.0)
Hemoglobin: 8 g/dL — ABNORMAL LOW (ref 13.0–17.0)
Immature Granulocytes: 1 %
Lymphocytes Relative: 24 %
Lymphs Abs: 2.6 10*3/uL (ref 0.7–4.0)
MCH: 25.4 pg — ABNORMAL LOW (ref 26.0–34.0)
MCHC: 30.8 g/dL (ref 30.0–36.0)
MCV: 82.5 fL (ref 80.0–100.0)
Monocytes Absolute: 1.1 10*3/uL — ABNORMAL HIGH (ref 0.1–1.0)
Monocytes Relative: 10 %
Neutro Abs: 6.9 10*3/uL (ref 1.7–7.7)
Neutrophils Relative %: 63 %
Platelet Count: 279 10*3/uL (ref 150–400)
RBC: 3.15 MIL/uL — ABNORMAL LOW (ref 4.22–5.81)
RDW: 22.3 % — ABNORMAL HIGH (ref 11.5–15.5)
WBC Count: 10.7 10*3/uL — ABNORMAL HIGH (ref 4.0–10.5)
nRBC: 0 % (ref 0.0–0.2)

## 2018-10-07 LAB — CMP (CANCER CENTER ONLY)
ALT: <6 U/L (ref 0–44)
AST: 7 U/L — ABNORMAL LOW (ref 15–41)
Albumin: 3.3 g/dL — ABNORMAL LOW (ref 3.5–5.0)
Alkaline Phosphatase: 222 U/L — ABNORMAL HIGH (ref 38–126)
Anion gap: 9 (ref 5–15)
BUN: 16 mg/dL (ref 8–23)
CO2: 16 mmol/L — AB (ref 22–32)
Calcium: 8.4 mg/dL — ABNORMAL LOW (ref 8.9–10.3)
Chloride: 112 mmol/L — ABNORMAL HIGH (ref 98–111)
Creatinine: 0.85 mg/dL (ref 0.61–1.24)
GFR, Estimated: >60 mL/min (ref >60)
GLUCOSE: 132 mg/dL — AB (ref 70–99)
Potassium: 3.9 mmol/L (ref 3.5–5.1)
SODIUM: 137 mmol/L (ref 135–145)
Total Bilirubin: 0.5 mg/dL (ref 0.3–1.2)
Total Protein: 6.6 g/dL (ref 6.5–8.1)

## 2018-10-07 LAB — SAMPLE TO BLOOD BANK

## 2018-10-07 MED ORDER — DIPHENHYDRAMINE HCL 50 MG/ML IJ SOLN
INTRAMUSCULAR | Status: AC
  Filled 2018-10-07: qty 1

## 2018-10-07 MED ORDER — SODIUM CHLORIDE 0.9 % IV SOLN
20.0000 mg/m2 | Freq: Once | INTRAVENOUS | Status: AC
  Administered 2018-10-07: 40 mg via INTRAVENOUS
  Filled 2018-10-07: qty 4

## 2018-10-07 MED ORDER — DEXAMETHASONE SODIUM PHOSPHATE 10 MG/ML IJ SOLN
INTRAMUSCULAR | Status: AC
  Filled 2018-10-07: qty 1

## 2018-10-07 MED ORDER — SODIUM CHLORIDE 0.9 % IV SOLN
Freq: Once | INTRAVENOUS | Status: AC
  Administered 2018-10-07: 09:00:00 via INTRAVENOUS
  Filled 2018-10-07: qty 250

## 2018-10-07 MED ORDER — SODIUM CHLORIDE 0.9 % IV SOLN: 10.0000 mg | Freq: Once | INTRAVENOUS | Status: DC

## 2018-10-07 MED ORDER — SODIUM CHLORIDE 0.9% FLUSH
10.0000 mL | INTRAVENOUS | Status: DC | PRN
  Administered 2018-10-07: 10 mL
  Filled 2018-10-07: qty 10

## 2018-10-07 MED ORDER — DEXTROSE 5 % IV SOLN: 20.0000 mg/m2 | Freq: Once | INTRAVENOUS | Status: DC

## 2018-10-07 MED ORDER — FAMOTIDINE IN NACL 20-0.9 MG/50ML-% IV SOLN
INTRAVENOUS | Status: AC
  Filled 2018-10-07: qty 50

## 2018-10-07 MED ORDER — HEPARIN SOD (PORK) LOCK FLUSH 100 UNIT/ML IV SOLN
500.0000 [IU] | Freq: Once | INTRAVENOUS | Status: AC | PRN
  Administered 2018-10-07: 500 [IU]
  Filled 2018-10-07: qty 5

## 2018-10-07 MED ORDER — DIPHENHYDRAMINE HCL 50 MG/ML IJ SOLN
25.0000 mg | Freq: Once | INTRAMUSCULAR | Status: AC
  Administered 2018-10-07: 25 mg via INTRAVENOUS

## 2018-10-07 MED ORDER — FAMOTIDINE IN NACL 20-0.9 MG/50ML-% IV SOLN
20.0000 mg | Freq: Once | INTRAVENOUS | Status: AC
  Administered 2018-10-07: 20 mg via INTRAVENOUS

## 2018-10-07 MED ORDER — DEXAMETHASONE SODIUM PHOSPHATE 10 MG/ML IJ SOLN
10.0000 mg | Freq: Once | INTRAMUSCULAR | Status: AC
  Administered 2018-10-07: 10 mg via INTRAVENOUS

## 2018-10-07 MED ORDER — SODIUM CHLORIDE 0.9% FLUSH
10.0000 mL | INTRAVENOUS | Status: DC | PRN
  Administered 2018-10-07: 10 mL via INTRAVENOUS
  Filled 2018-10-07: qty 10

## 2018-10-07 NOTE — Patient Instructions (Signed)
[facility] Discharge Instructions for Patients Receiving Chemotherapy  Today you received the following chemotherapy agents Jevtana.  To help prevent nausea and vomiting after your treatment, we encourage you to take your nausea medication as instructed.  If you develop nausea and vomiting that is not controlled by your nausea medication, call the clinic.   BELOW ARE SYMPTOMS THAT SHOULD BE REPORTED IMMEDIATELY:  *FEVER GREATER THAN 100.5 F  *CHILLS WITH OR WITHOUT FEVER  NAUSEA AND VOMITING THAT IS NOT CONTROLLED WITH YOUR NAUSEA MEDICATION  *UNUSUAL SHORTNESS OF BREATH  *UNUSUAL BRUISING OR BLEEDING  TENDERNESS IN MOUTH AND THROAT WITH OR WITHOUT PRESENCE OF ULCERS  *URINARY PROBLEMS  *BOWEL PROBLEMS  UNUSUAL RASH Items with * indicate a potential emergency and should be followed up as soon as possible.  Feel free to call the clinic should you have any questions or concerns. The clinic phone number is [phone].  Please show the [facility] at check-in to the Emergency Department and triage nurse.

## 2018-10-07 NOTE — Telephone Encounter (Signed)
Scheduled appt per 02/20 los. ° °Printed calendar and avs. °

## 2018-10-07 NOTE — Progress Notes (Signed)
Hematology and Oncology Follow Up Visit  Alec Harris [id number] November 17, 1945 73 y.o. 10/07/2018 8:23 AM Patient, No Pcp [name], MD   Principle Diagnosis: 73 year old man with advanced prostate cancer with disease to the bone that is currently castration-resistant prostate cancer documented in 2013.    Prior Therapy:  He underwent a prostatectomy with a pathology that showed prostatic adenocarcinoma. Gleason score 4+4 equals 8, with the tumor involving the margins and the pathologic staging was pT4 N1 with 1 of 2 left pelvic lymph nodes involved.   He was started on adjuvant hormone therapy with Lupron. He developed a rising PSA and bone metastasis.   He was then treated with ketoconazole and prednisone. He developed a rising PSA with a doubling time of just over 6 months. Bone scan in September 2013 showed increased uptake in the right lower lumbar spine at L4 and L5 and abnormal uptake in the mid sternum which had progressed since the prior bone scan.  Zytiga 1000 mg daily started in October 2013.  Discontinued in October 2018 after progression of disease.  Taxotere chemotherapy at 75 mg per meter square started on 06/02/2017.  The dose was reduced to 60 mg/m starting with cycle 3.  He completed 10 cycles of therapy and April 2019.   Xtandi 160 mg daily started in August 2019.  Therapy discontinued in December 2019 after progression of disease.   Current therapy:   Jevtana chemotherapy started on August 04, 2018.  He is here for cycle 4 of therapy and receiving 20 mg/m.   Lupron 30 mg every 4 months with the next injection scheduled for April 2020.    Interim History: Alec Harris returns today for repeat evaluation.  Since the last visit, he reports no major changes in his health.  He has tolerated chemotherapy without any nausea, vomiting or infusion related complications.  He denies any diarrhea but did have occasional constipation.  He does report some mild fatigue  but overall has been able to perform activities of daily living without any decline.  He denies any dyspnea on exertion or palpitation.  His quality of life remains maintained.  His bone pain has improved since the start of chemotherapy.  Patient denied headaches, blurry vision, syncope or seizures.  Denies any fevers, chills or sweats.  Denied chest pain, palpitation, orthopnea or leg edema.  Denied cough, wheezing or hemoptysis.  Denied nausea, vomiting or abdominal pain.  Denies any constipation or diarrhea.  Denies any frequency urgency or hesitancy.  Denies any arthralgias or myalgias.  Denies any skin rashes or lesions.  Denies any bleeding or clotting tendency.  Denies any easy bruising.  Denies any hair or nail changes.  Denies any anxiety or depression.  Remaining review of system is negative.       Medications:   Reviewed today with any changes. Current Outpatient Medications  Medication Sig Dispense Refill  . amLODipine (NORVASC) 5 MG tablet TAKE 1 TABLET (5 MG TOTAL) BY MOUTH DAILY. 90 tablet 3  . aspirin EC 81 MG tablet Take 81 mg by mouth daily.    [name] atorvastatin (LIPITOR) 20 MG tablet Take 1 tablet (20 mg total) by mouth daily. 90 tablet 1  . Calcium Carb-Cholecalciferol (CALCIUM 500 +D) 500-400 MG-UNIT TABS Take 1 tablet by mouth daily.     . cilostazol (PLETAL) 100 MG tablet Take 1 tablet (100 mg total) by mouth 2 (two) times daily. 180 tablet 3  . lidocaine-prilocaine (EMLA) cream Apply 1 application topically  as needed. 30 g 0  . megestrol (MEGACE) 400 MG/10ML suspension Take 10 mLs (400 mg total) by mouth daily. 240 mL 0  . prochlorperazine (COMPAZINE) 10 MG tablet Take 1 tablet (10 mg total) by mouth every 6 (six) hours as needed for nausea or vomiting. 30 tablet 0   No current facility-administered medications for this visit.    His past medical history, family history and social history reviewed today without any noted changes.  Allergies: No Known  Allergies   Physical Exam: Blood pressure (!) 144/62, pulse 79, temperature 97.8 F (36.6 C), temperature source Oral, resp. rate 18, height 5\' 9"  (1.753 m), weight 175 lb 14.4 oz (79.8 kg), SpO2 98 %.    ECOG: 1   General appearance: Comfortable appearing without any discomfort Head: Normocephalic without any trauma Oropharynx: Mucous membranes are moist and pink without any thrush or ulcers. Eyes: Pupils are equal and round reactive to light. Lymph nodes: No cervical, supraclavicular, inguinal or axillary lymphadenopathy.   Heart:regular rate and rhythm.  S1 and S2 without leg edema. Lung: Clear without any rhonchi or wheezes.  No dullness to percussion. Abdomin: Soft, nontender, nondistended with good bowel sounds.  No hepatosplenomegaly. Musculoskeletal: No joint deformity or effusion.  Full range of motion noted. Neurological: No deficits noted on motor, sensory and deep tendon reflex exam. Skin: No petechial rash or dryness.  Appeared moist.  Psychiatric: Mood and affect appeared appropriate.         Lab Results: Lab Results  Component Value Date   WBC 10.7 (H) 10/07/2018   HGB 8.0 (L) 10/07/2018   HCT 26.0 (L) 10/07/2018   MCV 82.5 10/07/2018   PLT 279 10/07/2018     Chemistry      Component Value Date/Time   NA 138 09/16/2018 0842   NA 137 08/05/2017 0839   K 3.9 09/16/2018 0842   K 3.9 08/05/2017 0839   CL 107 09/16/2018 0842   CL 106 02/01/2013 1120   CO2 22 09/16/2018 0842   CO2 23 08/05/2017 0839   BUN 11 09/16/2018 0842   BUN 12.2 08/05/2017 0839   CREATININE 0.81 09/16/2018 0842   CREATININE 0.9 08/05/2017 0839      Component Value Date/Time   CALCIUM 8.8 (L) 09/16/2018 0842   CALCIUM 8.6 08/05/2017 0839   ALKPHOS 246 (H) 09/16/2018 0842   ALKPHOS 146 08/05/2017 0839   AST 7 (L) 09/16/2018 0842   AST 7 08/05/2017 0839   ALT <6 09/16/2018 0842   ALT <6 08/05/2017 0839   BILITOT 0.4 09/16/2018 0842   BILITOT 0.46 08/05/2017 0839         Results for LEXIE, MORINI (MRN [id number]) as of 10/07/2018 08:09  Ref. Range 08/26/2018 11:56 09/16/2018 08:42  Prostate Specific Ag, Serum Latest Ref Range: 0.0 - 4.0 ng/mL 33.5 (H) 46.1 (H)      Impression and Plan:   73 year old man with:  1.  Advanced prostate cancer with disease to the bone and is currently castration-resistant documented in 2013.   He has tolerated chemotherapy without any major complications with improvement in his clinical status and bone pain.  Risks and benefits of continuing Jevtana long-term was reviewed today.  His PSA did show slight increase over the last cycle but overall has showed decline.  After discussion today is agreeable to continue and will continue to monitor his PSA and clinical status.  I anticipate 10 cycles of therapy depending on his tolerance.     2. Androgen deprivation.  Next Lupron will be given in April 2020.  Long-term complication associated with this therapy including osteoporosis and hot flashes.  He is agreeable to continue.  3. Bone directed therapy.  I recommended calcium and vitamin D supplements.  Delton See has been deferred because of dental issues.  4. Anemia: Hemoglobin currently at 8.0 although he is asymptomatic.  Will defer transfusion for the time being.  5. Bone pain: Resolved at this time since the start of chemotherapy.  6. IV access: Port-A-Cath has been in use without any issues at this time.  7.  Prognosis and goals of care: Therapy remains palliative at this time with disease is incurable.  Aggressive therapy is required however given his excellent performance status.  8.  Anorexia: Improved at this time with Megace and his weight is stable.  9. Followup.  In 3 weeks for next cycle of therapy.  25 minutes was spent face-to-face with the patient today.  More than 50% was spent on reviewing his disease status, laboratory data review, addressing complications related to chemotherapy and answering questions  regarding future plan of care.Zola Button, MD 2/20/20208:23 AM

## 2018-10-08 ENCOUNTER — Telehealth: Payer: Self-pay

## 2018-10-08 LAB — PROSTATE-SPECIFIC AG, SERUM (LABCORP): Prostate Specific Ag, Serum: 38.8 ng/mL — ABNORMAL HIGH (ref 0.0–4.0)

## 2018-10-08 NOTE — Telephone Encounter (Signed)
Contacted patient and made aware of results. 

## 2018-10-08 NOTE — Telephone Encounter (Signed)
-----   Message from [name], MD sent at 10/08/2018  8:12 AM EST ----- Please let him know his PSA is declining.

## 2018-10-09 ENCOUNTER — Inpatient Hospital Stay: Payer: Medicare Other

## 2018-10-09 VITALS — BP 142/58 | HR 88 | Temp 98.3°F | Resp 16

## 2018-10-09 DIAGNOSIS — Z5111 Encounter for antineoplastic chemotherapy: Secondary | ICD-10-CM | POA: Diagnosis not present

## 2018-10-09 DIAGNOSIS — Z7689 Persons encountering health services in other specified circumstances: Secondary | ICD-10-CM | POA: Diagnosis not present

## 2018-10-09 DIAGNOSIS — Z79818 Long term (current) use of other agents affecting estrogen receptors and estrogen levels: Secondary | ICD-10-CM | POA: Diagnosis not present

## 2018-10-09 DIAGNOSIS — C7951 Secondary malignant neoplasm of bone: Secondary | ICD-10-CM | POA: Diagnosis not present

## 2018-10-09 DIAGNOSIS — Z9079 Acquired absence of other genital organ(s): Secondary | ICD-10-CM | POA: Diagnosis not present

## 2018-10-09 DIAGNOSIS — C61 Malignant neoplasm of prostate: Secondary | ICD-10-CM | POA: Diagnosis not present

## 2018-10-09 MED ORDER — PEGFILGRASTIM-CBQV 6 MG/0.6ML ~~LOC~~ SOSY
PREFILLED_SYRINGE | SUBCUTANEOUS | Status: AC
  Filled 2018-10-09: qty 0.6

## 2018-10-09 MED ORDER — PEGFILGRASTIM-CBQV 6 MG/0.6ML ~~LOC~~ SOSY
6.0000 mg | PREFILLED_SYRINGE | Freq: Once | SUBCUTANEOUS | Status: AC
  Administered 2018-10-09: 6 mg via SUBCUTANEOUS

## 2018-10-09 NOTE — Patient Instructions (Signed)
Pegfilgrastim injection  What is this medicine?  PEGFILGRASTIM (PEG fil gra stim) is a long-acting granulocyte colony-stimulating factor that stimulates the growth of neutrophils, a type of white blood cell important in the body's fight against infection. It is used to reduce the incidence of fever and infection in patients with certain types of cancer who are receiving chemotherapy that affects the bone marrow, and to increase survival after being exposed to high doses of radiation.  This medicine may be used for other purposes; ask your health care provider or pharmacist if you have questions.  COMMON BRAND NAME(S): Fulphila, Neulasta, UDENYCA  What should I tell my health care provider before I take this medicine?  They need to know if you have any of these conditions:  -kidney disease  -latex allergy  -ongoing radiation therapy  -sickle cell disease  -skin reactions to acrylic adhesives (On-Body Injector only)  -an unusual or allergic reaction to pegfilgrastim, filgrastim, other medicines, foods, dyes, or preservatives  -pregnant or trying to get pregnant  -breast-feeding  How should I use this medicine?  This medicine is for injection under the skin. If you get this medicine at home, you will be taught how to prepare and give the pre-filled syringe or how to use the On-body Injector. Refer to the patient Instructions for Use for detailed instructions. Use exactly as directed. Tell your healthcare provider immediately if you suspect that the On-body Injector may not have performed as intended or if you suspect the use of the On-body Injector resulted in a missed or partial dose.  It is important that you put your used needles and syringes in a special sharps container. Do not put them in a trash can. If you do not have a sharps container, call your pharmacist or healthcare provider to get one.  Talk to your pediatrician regarding the use of this medicine in children. While this drug may be prescribed for  selected conditions, precautions do apply.  Overdosage: If you think you have taken too much of this medicine contact a poison control center or emergency room at once.  NOTE: This medicine is only for you. Do not share this medicine with others.  What if I miss a dose?  It is important not to miss your dose. Call your doctor or health care professional if you miss your dose. If you miss a dose due to an On-body Injector failure or leakage, a new dose should be administered as soon as possible using a single prefilled syringe for manual use.  What may interact with this medicine?  Interactions have not been studied.  Give your health care provider a list of all the medicines, herbs, non-prescription drugs, or dietary supplements you use. Also tell them if you smoke, drink alcohol, or use illegal drugs. Some items may interact with your medicine.  This list may not describe all possible interactions. Give your health care provider a list of all the medicines, herbs, non-prescription drugs, or dietary supplements you use. Also tell them if you smoke, drink alcohol, or use illegal drugs. Some items may interact with your medicine.  What should I watch for while using this medicine?  You may need blood work done while you are taking this medicine.  If you are going to need a MRI, CT scan, or other procedure, tell your doctor that you are using this medicine (On-Body Injector only).  What side effects may I notice from receiving this medicine?  Side effects that you should report to   your doctor or health care professional as soon as possible:  -allergic reactions like skin rash, itching or hives, swelling of the face, lips, or tongue  -back pain  -dizziness  -fever  -pain, redness, or irritation at site where injected  -pinpoint red spots on the skin  -red or dark-brown urine  -shortness of breath or breathing problems  -stomach or side pain, or pain at the shoulder  -swelling  -tiredness  -trouble passing urine or  change in the amount of urine  Side effects that usually do not require medical attention (report to your doctor or health care professional if they continue or are bothersome):  -bone pain  -muscle pain  This list may not describe all possible side effects. Call your doctor for medical advice about side effects. You may report side effects to FDA at 1-800-FDA-1088.  Where should I keep my medicine?  Keep out of the reach of children.  If you are using this medicine at home, you will be instructed on how to store it. Throw away any unused medicine after the expiration date on the label.  NOTE: This sheet is a summary. It may not cover all possible information. If you have questions about this medicine, talk to your doctor, pharmacist, or health care provider.   2019 Elsevier/Gold Standard (2017-11-09 16:57:08)

## 2018-10-18 ENCOUNTER — Other Ambulatory Visit: Payer: Medicare Other

## 2018-10-25 ENCOUNTER — Other Ambulatory Visit: Payer: Self-pay | Admitting: Cardiovascular Disease

## 2018-10-28 ENCOUNTER — Inpatient Hospital Stay (HOSPITAL_BASED_OUTPATIENT_CLINIC_OR_DEPARTMENT_OTHER): Payer: Medicare Other | Admitting: Oncology

## 2018-10-28 ENCOUNTER — Other Ambulatory Visit: Payer: Self-pay

## 2018-10-28 ENCOUNTER — Inpatient Hospital Stay: Payer: Medicare Other

## 2018-10-28 ENCOUNTER — Telehealth: Payer: Self-pay | Admitting: Oncology

## 2018-10-28 ENCOUNTER — Inpatient Hospital Stay: Payer: Medicare Other | Attending: Oncology

## 2018-10-28 VITALS — BP 131/67 | HR 75 | Temp 97.9°F | Resp 17 | Ht 69.0 in | Wt 175.7 lb

## 2018-10-28 DIAGNOSIS — Z5111 Encounter for antineoplastic chemotherapy: Secondary | ICD-10-CM | POA: Insufficient documentation

## 2018-10-28 DIAGNOSIS — Z79899 Other long term (current) drug therapy: Secondary | ICD-10-CM | POA: Diagnosis not present

## 2018-10-28 DIAGNOSIS — C61 Malignant neoplasm of prostate: Secondary | ICD-10-CM

## 2018-10-28 DIAGNOSIS — C7951 Secondary malignant neoplasm of bone: Secondary | ICD-10-CM | POA: Diagnosis not present

## 2018-10-28 DIAGNOSIS — D649 Anemia, unspecified: Secondary | ICD-10-CM | POA: Insufficient documentation

## 2018-10-28 DIAGNOSIS — Z95828 Presence of other vascular implants and grafts: Secondary | ICD-10-CM

## 2018-10-28 DIAGNOSIS — Z7982 Long term (current) use of aspirin: Secondary | ICD-10-CM | POA: Insufficient documentation

## 2018-10-28 LAB — COMPREHENSIVE METABOLIC PANEL
ALT: 7 U/L (ref 0–44)
AST: 8 U/L — ABNORMAL LOW (ref 15–41)
Albumin: 3.2 g/dL — ABNORMAL LOW (ref 3.5–5.0)
Alkaline Phosphatase: 191 U/L — ABNORMAL HIGH (ref 38–126)
Anion gap: 9 (ref 5–15)
BUN: 13 mg/dL (ref 8–23)
CO2: 17 mmol/L — AB (ref 22–32)
Calcium: 8.4 mg/dL — ABNORMAL LOW (ref 8.9–10.3)
Chloride: 112 mmol/L — ABNORMAL HIGH (ref 98–111)
Creatinine, Ser: 0.83 mg/dL (ref 0.61–1.24)
GFR calc Af Amer: >60 mL/min (ref >60)
GFR calc non Af Amer: >60 mL/min (ref >60)
Glucose, Bld: 95 mg/dL (ref 70–99)
Potassium: 4.2 mmol/L (ref 3.5–5.1)
Sodium: 138 mmol/L (ref 135–145)
Total Bilirubin: 0.5 mg/dL (ref 0.3–1.2)
Total Protein: 6.6 g/dL (ref 6.5–8.1)

## 2018-10-28 LAB — CBC WITH DIFFERENTIAL/PLATELET
Abs Immature Granulocytes: 0.06 10*3/uL (ref 0.00–0.07)
Basophils Absolute: 0.1 10*3/uL (ref 0.0–0.1)
Basophils Relative: 0 %
Eosinophils Absolute: 0 10*3/uL (ref 0.0–0.5)
Eosinophils Relative: 0 %
HCT: 25.9 % — ABNORMAL LOW (ref 39.0–52.0)
Hemoglobin: 7.9 g/dL — ABNORMAL LOW (ref 13.0–17.0)
Immature Granulocytes: 1 %
LYMPHS ABS: 2.4 10*3/uL (ref 0.7–4.0)
LYMPHS PCT: 21 %
MCH: 25.4 pg — ABNORMAL LOW (ref 26.0–34.0)
MCHC: 30.5 g/dL (ref 30.0–36.0)
MCV: 83.3 fL (ref 80.0–100.0)
MONOS PCT: 12 %
Monocytes Absolute: 1.4 10*3/uL — ABNORMAL HIGH (ref 0.1–1.0)
Neutro Abs: 7.9 10*3/uL — ABNORMAL HIGH (ref 1.7–7.7)
Neutrophils Relative %: 66 %
Platelets: 299 10*3/uL (ref 150–400)
RBC: 3.11 MIL/uL — ABNORMAL LOW (ref 4.22–5.81)
RDW: 24.9 % — ABNORMAL HIGH (ref 11.5–15.5)
WBC: 11.8 10*3/uL — ABNORMAL HIGH (ref 4.0–10.5)
nRBC: 0 % (ref 0.0–0.2)

## 2018-10-28 LAB — SAMPLE TO BLOOD BANK

## 2018-10-28 MED ORDER — SODIUM CHLORIDE 0.9 % IV SOLN
20.0000 mg/m2 | Freq: Once | INTRAVENOUS | Status: AC
  Administered 2018-10-28: 40 mg via INTRAVENOUS
  Filled 2018-10-28: qty 4

## 2018-10-28 MED ORDER — SODIUM CHLORIDE 0.9% FLUSH
10.0000 mL | INTRAVENOUS | Status: DC | PRN
  Administered 2018-10-28: 10 mL via INTRAVENOUS
  Filled 2018-10-28: qty 10

## 2018-10-28 MED ORDER — DIPHENHYDRAMINE HCL 50 MG/ML IJ SOLN
INTRAMUSCULAR | Status: AC
  Filled 2018-10-28: qty 1

## 2018-10-28 MED ORDER — FAMOTIDINE IN NACL 20-0.9 MG/50ML-% IV SOLN: 20.0000 mg | Freq: Once | INTRAVENOUS | Status: DC

## 2018-10-28 MED ORDER — DEXAMETHASONE SODIUM PHOSPHATE 10 MG/ML IJ SOLN
INTRAMUSCULAR | Status: AC
  Filled 2018-10-28: qty 1

## 2018-10-28 MED ORDER — SODIUM CHLORIDE 0.9 % IV SOLN
Freq: Once | INTRAVENOUS | Status: AC
  Administered 2018-10-28: 12:00:00 via INTRAVENOUS
  Filled 2018-10-28: qty 250

## 2018-10-28 MED ORDER — HEPARIN SOD (PORK) LOCK FLUSH 100 UNIT/ML IV SOLN
500.0000 [IU] | Freq: Once | INTRAVENOUS | Status: AC | PRN
  Administered 2018-10-28: 500 [IU]
  Filled 2018-10-28: qty 5

## 2018-10-28 MED ORDER — DIPHENHYDRAMINE HCL 50 MG/ML IJ SOLN
25.0000 mg | Freq: Once | INTRAMUSCULAR | Status: AC
  Administered 2018-10-28: 25 mg via INTRAVENOUS

## 2018-10-28 MED ORDER — SODIUM CHLORIDE 0.9 % IV SOLN: 10.0000 mg | Freq: Once | INTRAVENOUS | Status: DC

## 2018-10-28 MED ORDER — SODIUM CHLORIDE 0.9% FLUSH
10.0000 mL | INTRAVENOUS | Status: DC | PRN
  Administered 2018-10-28: 10 mL
  Filled 2018-10-28: qty 10

## 2018-10-28 MED ORDER — DEXAMETHASONE SODIUM PHOSPHATE 10 MG/ML IJ SOLN
10.0000 mg | Freq: Once | INTRAMUSCULAR | Status: AC
  Administered 2018-10-28: 10 mg via INTRAVENOUS

## 2018-10-28 MED ORDER — SODIUM CHLORIDE 0.9 % IV SOLN
20.0000 mg | Freq: Once | INTRAVENOUS | Status: AC
  Administered 2018-10-28: 20 mg via INTRAVENOUS
  Filled 2018-10-28: qty 2

## 2018-10-28 NOTE — Progress Notes (Signed)
MD ok for Jevtana today w/ Hgb = 7.9 g/dL Kennith Center, Pharm.D., CPP 10/28/2018@11 :54 AM

## 2018-10-28 NOTE — Patient Instructions (Signed)
[facility] Discharge Instructions for Patients Receiving Chemotherapy  Today you received the following chemotherapy agents Jevtana.  To help prevent nausea and vomiting after your treatment, we encourage you to take your nausea medication as instructed.  If you develop nausea and vomiting that is not controlled by your nausea medication, call the clinic.   BELOW ARE SYMPTOMS THAT SHOULD BE REPORTED IMMEDIATELY:  *FEVER GREATER THAN 100.5 F  *CHILLS WITH OR WITHOUT FEVER  NAUSEA AND VOMITING THAT IS NOT CONTROLLED WITH YOUR NAUSEA MEDICATION  *UNUSUAL SHORTNESS OF BREATH  *UNUSUAL BRUISING OR BLEEDING  TENDERNESS IN MOUTH AND THROAT WITH OR WITHOUT PRESENCE OF ULCERS  *URINARY PROBLEMS  *BOWEL PROBLEMS  UNUSUAL RASH Items with * indicate a potential emergency and should be followed up as soon as possible.  Feel free to call the clinic should you have any questions or concerns. The clinic phone number is [phone].  Please show the [facility] at check-in to the Emergency Department and triage nurse.

## 2018-10-28 NOTE — Progress Notes (Signed)
Hematology and Oncology Follow Up Visit  Alec Harris [id number] 1946/01/13 73 y.o. 10/28/2018 10:23 AM Patient, No Pcp PerShadad, [name], MD   Principle Diagnosis: 69 year old man with castration-resistant prostate cancer with disease to the bone diagnosed in 2013.    Prior Therapy:  He underwent a prostatectomy with a pathology that showed prostatic adenocarcinoma. Gleason score 4+4 equals 8, with the tumor involving the margins and the pathologic staging was pT4 N1 with 1 of 2 left pelvic lymph nodes involved.   He was started on adjuvant hormone therapy with Lupron. He developed a rising PSA and bone metastasis.   He was then treated with ketoconazole and prednisone. He developed a rising PSA with a doubling time of just over 6 months. Bone scan in September 2013 showed increased uptake in the right lower lumbar spine at L4 and L5 and abnormal uptake in the mid sternum which had progressed since the prior bone scan.  Zytiga 1000 mg daily started in October 2013.  Discontinued in October 2018 after progression of disease.  Taxotere chemotherapy at 75 mg per meter square started on 06/02/2017.  The dose was reduced to 60 mg/m starting with cycle 3.  He completed 10 cycles of therapy and April 2019.   Xtandi 160 mg daily started in August 2019.  Therapy discontinued in December 2019 after progression of disease.   Current therapy:   Jevtana chemotherapy started on August 04, 2018.  He is here for cycle 5 of therapy and receiving 20 mg/m.   Lupron 30 mg every 4 months with the next injection scheduled for April 2020.    Interim History: [name] returns today for a repeat evaluation.  Since the last visit, he received the last cycle of chemotherapy without any recent complaints.  He denies any nausea, fatigue or infusion related issues.  His performance status and quality of life has improved since the start of chemotherapy.  He denies any bone pain or pathological fractures.   His quality of life and appetite is unchanged.  He did have one episode of loose bowel movements which has resolved at this time.  He denies any fevers or chills or arthralgias.  Denies any flu symptoms.   Patient denied headaches, blurry vision, syncope or seizures.  Denies any fevers, chills or sweats.  Denied chest pain, palpitation, orthopnea or leg edema.  Denied cough, wheezing or hemoptysis.  Denied nausea, vomiting or abdominal pain.  Denies any constipation or diarrhea.  Denies any frequency urgency or hesitancy.  Denies any arthralgias or myalgias.  Denies any skin rashes or lesions.  Denies any bleeding or clotting tendency.  Denies any easy bruising.  Denies any hair or nail changes.  Denies any anxiety or depression.  Remaining review of system is negative.         Medications:   Reviewed today with any changes. Current Outpatient Medications  Medication Sig Dispense Refill  . amLODipine (NORVASC) 5 MG tablet TAKE 1 TABLET (5 MG TOTAL) BY MOUTH DAILY. 90 tablet 3  . aspirin EC 81 MG tablet Take 81 mg by mouth daily.    [name] atorvastatin (LIPITOR) 20 MG tablet TAKE 1 TABLET BY MOUTH EVERY DAY 90 tablet 1  . Calcium Carb-Cholecalciferol (CALCIUM 500 +D) 500-400 MG-UNIT TABS Take 1 tablet by mouth daily.     . cilostazol (PLETAL) 100 MG tablet Take 1 tablet (100 mg total) by mouth 2 (two) times daily. 180 tablet 3  . lidocaine-prilocaine (EMLA) cream Apply 1 application  topically as needed. 30 g 0  . megestrol (MEGACE) 400 MG/10ML suspension Take 10 mLs (400 mg total) by mouth daily. 240 mL 0  . prochlorperazine (COMPAZINE) 10 MG tablet Take 1 tablet (10 mg total) by mouth every 6 (six) hours as needed for nausea or vomiting. 30 tablet 0   No current facility-administered medications for this visit.    Facility-Administered Medications Ordered in Other Visits  Medication Dose Route Frequency Provider Last Rate Last Dose  . sodium chloride flush (NS) 0.9 % injection 10 mL  10 mL  Intravenous PRN [name], MD   10 mL at 10/28/18 1010   His past medical history, family history and social history reviewed today without any noted changes.  Allergies: No Known Allergies   Physical Exam:  Blood pressure 131/67, pulse 75, temperature 97.9 F (36.6 C), temperature source Oral, resp. rate 17, height 5\' 9"  (1.753 m), weight 175 lb 11.2 oz (79.7 kg), SpO2 100 %.    ECOG: 1   General appearance: Alert, awake without any distress. Head: Atraumatic without abnormalities Oropharynx: Without any thrush or ulcers. Eyes: No scleral icterus. Lymph nodes: No lymphadenopathy noted in the cervical, supraclavicular, or axillary nodes Heart:regular rate and rhythm, without any murmurs or gallops.   Lung: Clear to auscultation without any rhonchi, wheezes or dullness to percussion. Abdomin: Soft, nontender without any shifting dullness or ascites. Musculoskeletal: No clubbing or cyanosis. Neurological: No motor or sensory deficits. Skin: No rashes or lesions.          Lab Results: Lab Results  Component Value Date   WBC 10.7 (H) 10/07/2018   HGB 8.0 (L) 10/07/2018   HCT 26.0 (L) 10/07/2018   MCV 82.5 10/07/2018   PLT 279 10/07/2018     Chemistry      Component Value Date/Time   NA 137 10/07/2018 0800   NA 137 08/05/2017 0839   K 3.9 10/07/2018 0800   K 3.9 08/05/2017 0839   CL 112 (H) 10/07/2018 0800   CL 106 02/01/2013 1120   CO2 16 (L) 10/07/2018 0800   CO2 23 08/05/2017 0839   BUN 16 10/07/2018 0800   BUN 12.2 08/05/2017 0839   CREATININE 0.85 10/07/2018 0800   CREATININE 0.9 08/05/2017 0839      Component Value Date/Time   CALCIUM 8.4 (L) 10/07/2018 0800   CALCIUM 8.6 08/05/2017 0839   ALKPHOS 222 (H) 10/07/2018 0800   ALKPHOS 146 08/05/2017 0839   AST 7 (L) 10/07/2018 0800   AST 7 08/05/2017 0839   ALT <6 10/07/2018 0800   ALT <6 08/05/2017 0839   BILITOT 0.5 10/07/2018 0800   BILITOT 0.46 08/05/2017 0839      Results for  ALEZANDER, DIMAANO (MRN [id number]) as of 10/28/2018 10:24  Ref. Range 08/26/2018 11:56 09/16/2018 08:42 10/07/2018 08:00  Prostate Specific Ag, Serum Latest Ref Range: 0.0 - 4.0 ng/mL 33.5 (H) 46.1 (H) 38.8 (H)              Impression and Plan:   73 year old man with:  1.  Castration-resistant prostate cancer with disease to the bone diagnosed since 2013.     He remains on chemotherapy at this time with excellent tolerance and reasonable clinical benefit on Jevtana.  Risks and benefits of continuing this therapy long-term was reviewed today.  Complications include nausea, fatigue and myelosuppression.  Diarrhea has also been documented but seems to be manageable.  His PSA is showing some reasonable response and clinical benefit.  He is  agreeable to continue at this time.     2. Androgen deprivation.  Lupron will be continued every 4 months next injection scheduled for April 2020.  3. Bone directed therapy.  He remains on calcium and vitamin D and Delton See is deferred because of dental issues.  4. Anemia: Hemoglobin remains stable at this time remains asymptomatic.  Will consider packed red cell transfusion if he becomes symptomatic.  5. Bone pain: No issues reported since the start of chemotherapy.  We will continue to monitor.  6. IV access: Port-A-Cath continues to be in use without any issues or complications.  7.  Prognosis and goals of care: Treatment remains palliative although his performance status is excellent and aggressive therapy is warranted.  8.  Anorexia: His weight remains stable without any intervention.  We will continue to monitor and advise nutritional supplements as needed.  9. Followup.  Will be in 3 weeks for evaluation prior to his next cycle of therapy.  25 minutes was spent face-to-face with the patient today.  More than 50% was spent on discussing his disease status, complications related to therapy and answering future plan of care questions.  Zola Button,  MD 3/12/202010:23 AM

## 2018-10-28 NOTE — Telephone Encounter (Signed)
Scheduled appt per 3/12 los. ° °Printed calendar and avs. ° °

## 2018-10-29 ENCOUNTER — Telehealth: Payer: Self-pay | Admitting: *Deleted

## 2018-10-29 LAB — PROSTATE-SPECIFIC AG, SERUM (LABCORP): Prostate Specific Ag, Serum: 29.7 ng/mL — ABNORMAL HIGH (ref 0.0–4.0)

## 2018-10-29 NOTE — Telephone Encounter (Signed)
-----   Message from [name], MD sent at 10/29/2018  8:04 AM EDT ----- Please let him know his PSA is down.

## 2018-10-29 NOTE — Telephone Encounter (Signed)
Notified of message below

## 2018-10-30 ENCOUNTER — Inpatient Hospital Stay: Payer: Medicare Other

## 2018-10-30 ENCOUNTER — Other Ambulatory Visit: Payer: Self-pay

## 2018-10-30 VITALS — BP 143/66 | HR 76 | Resp 18

## 2018-10-30 DIAGNOSIS — D649 Anemia, unspecified: Secondary | ICD-10-CM | POA: Diagnosis not present

## 2018-10-30 DIAGNOSIS — Z7982 Long term (current) use of aspirin: Secondary | ICD-10-CM | POA: Diagnosis not present

## 2018-10-30 DIAGNOSIS — C61 Malignant neoplasm of prostate: Secondary | ICD-10-CM

## 2018-10-30 DIAGNOSIS — Z79899 Other long term (current) drug therapy: Secondary | ICD-10-CM | POA: Diagnosis not present

## 2018-10-30 DIAGNOSIS — Z5111 Encounter for antineoplastic chemotherapy: Secondary | ICD-10-CM | POA: Diagnosis not present

## 2018-10-30 DIAGNOSIS — C7951 Secondary malignant neoplasm of bone: Secondary | ICD-10-CM | POA: Diagnosis not present

## 2018-10-30 MED ORDER — PEGFILGRASTIM-CBQV 6 MG/0.6ML ~~LOC~~ SOSY
6.0000 mg | PREFILLED_SYRINGE | Freq: Once | SUBCUTANEOUS | Status: AC
  Administered 2018-10-30: 6 mg via SUBCUTANEOUS

## 2018-10-30 MED ORDER — PEGFILGRASTIM-CBQV 6 MG/0.6ML ~~LOC~~ SOSY
PREFILLED_SYRINGE | SUBCUTANEOUS | Status: AC
  Filled 2018-10-30: qty 0.6

## 2018-10-30 NOTE — Patient Instructions (Signed)
Pegfilgrastim injection  What is this medicine?  PEGFILGRASTIM (PEG fil gra stim) is a long-acting granulocyte colony-stimulating factor that stimulates the growth of neutrophils, a type of white blood cell important in the body's fight against infection. It is used to reduce the incidence of fever and infection in patients with certain types of cancer who are receiving chemotherapy that affects the bone marrow, and to increase survival after being exposed to high doses of radiation.  This medicine may be used for other purposes; ask your health care provider or pharmacist if you have questions.  COMMON BRAND NAME(S): Fulphila, Neulasta, UDENYCA  What should I tell my health care provider before I take this medicine?  They need to know if you have any of these conditions:  -kidney disease  -latex allergy  -ongoing radiation therapy  -sickle cell disease  -skin reactions to acrylic adhesives (On-Body Injector only)  -an unusual or allergic reaction to pegfilgrastim, filgrastim, other medicines, foods, dyes, or preservatives  -pregnant or trying to get pregnant  -breast-feeding  How should I use this medicine?  This medicine is for injection under the skin. If you get this medicine at home, you will be taught how to prepare and give the pre-filled syringe or how to use the On-body Injector. Refer to the patient Instructions for Use for detailed instructions. Use exactly as directed. Tell your healthcare provider immediately if you suspect that the On-body Injector may not have performed as intended or if you suspect the use of the On-body Injector resulted in a missed or partial dose.  It is important that you put your used needles and syringes in a special sharps container. Do not put them in a trash can. If you do not have a sharps container, call your pharmacist or healthcare provider to get one.  Talk to your pediatrician regarding the use of this medicine in children. While this drug may be prescribed for  selected conditions, precautions do apply.  Overdosage: If you think you have taken too much of this medicine contact a poison control center or emergency room at once.  NOTE: This medicine is only for you. Do not share this medicine with others.  What if I miss a dose?  It is important not to miss your dose. Call your doctor or health care professional if you miss your dose. If you miss a dose due to an On-body Injector failure or leakage, a new dose should be administered as soon as possible using a single prefilled syringe for manual use.  What may interact with this medicine?  Interactions have not been studied.  Give your health care provider a list of all the medicines, herbs, non-prescription drugs, or dietary supplements you use. Also tell them if you smoke, drink alcohol, or use illegal drugs. Some items may interact with your medicine.  This list may not describe all possible interactions. Give your health care provider a list of all the medicines, herbs, non-prescription drugs, or dietary supplements you use. Also tell them if you smoke, drink alcohol, or use illegal drugs. Some items may interact with your medicine.  What should I watch for while using this medicine?  You may need blood work done while you are taking this medicine.  If you are going to need a MRI, CT scan, or other procedure, tell your doctor that you are using this medicine (On-Body Injector only).  What side effects may I notice from receiving this medicine?  Side effects that you should report to   your doctor or health care professional as soon as possible:  -allergic reactions like skin rash, itching or hives, swelling of the face, lips, or tongue  -back pain  -dizziness  -fever  -pain, redness, or irritation at site where injected  -pinpoint red spots on the skin  -red or dark-brown urine  -shortness of breath or breathing problems  -stomach or side pain, or pain at the shoulder  -swelling  -tiredness  -trouble passing urine or  change in the amount of urine  Side effects that usually do not require medical attention (report to your doctor or health care professional if they continue or are bothersome):  -bone pain  -muscle pain  This list may not describe all possible side effects. Call your doctor for medical advice about side effects. You may report side effects to FDA at 1-800-FDA-1088.  Where should I keep my medicine?  Keep out of the reach of children.  If you are using this medicine at home, you will be instructed on how to store it. Throw away any unused medicine after the expiration date on the label.  NOTE: This sheet is a summary. It may not cover all possible information. If you have questions about this medicine, talk to your doctor, pharmacist, or health care provider.   2019 Elsevier/Gold Standard (2017-11-09 16:57:08)

## 2018-11-18 ENCOUNTER — Inpatient Hospital Stay (HOSPITAL_BASED_OUTPATIENT_CLINIC_OR_DEPARTMENT_OTHER): Payer: Medicare Other | Admitting: Oncology

## 2018-11-18 ENCOUNTER — Telehealth: Payer: Self-pay | Admitting: Oncology

## 2018-11-18 ENCOUNTER — Inpatient Hospital Stay: Payer: Medicare Other | Attending: Oncology

## 2018-11-18 ENCOUNTER — Other Ambulatory Visit: Payer: Self-pay | Admitting: Oncology

## 2018-11-18 ENCOUNTER — Inpatient Hospital Stay: Payer: Medicare Other

## 2018-11-18 ENCOUNTER — Other Ambulatory Visit: Payer: Self-pay

## 2018-11-18 VITALS — BP 137/68 | HR 85 | Temp 97.8°F | Resp 18 | Ht 69.0 in | Wt 175.1 lb

## 2018-11-18 DIAGNOSIS — Z79899 Other long term (current) drug therapy: Secondary | ICD-10-CM | POA: Insufficient documentation

## 2018-11-18 DIAGNOSIS — D630 Anemia in neoplastic disease: Secondary | ICD-10-CM | POA: Diagnosis not present

## 2018-11-18 DIAGNOSIS — Z79818 Long term (current) use of other agents affecting estrogen receptors and estrogen levels: Secondary | ICD-10-CM | POA: Insufficient documentation

## 2018-11-18 DIAGNOSIS — C61 Malignant neoplasm of prostate: Secondary | ICD-10-CM

## 2018-11-18 DIAGNOSIS — Z9079 Acquired absence of other genital organ(s): Secondary | ICD-10-CM

## 2018-11-18 DIAGNOSIS — D6481 Anemia due to antineoplastic chemotherapy: Secondary | ICD-10-CM | POA: Diagnosis not present

## 2018-11-18 DIAGNOSIS — C7951 Secondary malignant neoplasm of bone: Secondary | ICD-10-CM | POA: Diagnosis not present

## 2018-11-18 DIAGNOSIS — Z7689 Persons encountering health services in other specified circumstances: Secondary | ICD-10-CM | POA: Diagnosis not present

## 2018-11-18 DIAGNOSIS — D649 Anemia, unspecified: Secondary | ICD-10-CM

## 2018-11-18 DIAGNOSIS — Z5111 Encounter for antineoplastic chemotherapy: Secondary | ICD-10-CM | POA: Insufficient documentation

## 2018-11-18 DIAGNOSIS — Z95828 Presence of other vascular implants and grafts: Secondary | ICD-10-CM

## 2018-11-18 DIAGNOSIS — Z7982 Long term (current) use of aspirin: Secondary | ICD-10-CM | POA: Insufficient documentation

## 2018-11-18 DIAGNOSIS — T451X5S Adverse effect of antineoplastic and immunosuppressive drugs, sequela: Secondary | ICD-10-CM | POA: Insufficient documentation

## 2018-11-18 LAB — CMP (CANCER CENTER ONLY)
ALT: 12 U/L (ref 0–44)
AST: 9 U/L — ABNORMAL LOW (ref 15–41)
Albumin: 3.4 g/dL — ABNORMAL LOW (ref 3.5–5.0)
Alkaline Phosphatase: 193 U/L — ABNORMAL HIGH (ref 38–126)
Anion gap: 8 (ref 5–15)
BUN: 17 mg/dL (ref 8–23)
CO2: 18 mmol/L — ABNORMAL LOW (ref 22–32)
Calcium: 8.6 mg/dL — ABNORMAL LOW (ref 8.9–10.3)
Chloride: 111 mmol/L (ref 98–111)
Creatinine: 0.91 mg/dL (ref 0.61–1.24)
GFR, Est AFR Am: >60 mL/min (ref >60)
GFR, Estimated: >60 mL/min (ref >60)
Glucose, Bld: 132 mg/dL — ABNORMAL HIGH (ref 70–99)
Potassium: 3.8 mmol/L (ref 3.5–5.1)
Sodium: 137 mmol/L (ref 135–145)
Total Bilirubin: 0.5 mg/dL (ref 0.3–1.2)
Total Protein: 6.7 g/dL (ref 6.5–8.1)

## 2018-11-18 LAB — CBC WITH DIFFERENTIAL (CANCER CENTER ONLY)
Abs Immature Granulocytes: 0.02 10*3/uL (ref 0.00–0.07)
Basophils Absolute: 0.1 10*3/uL (ref 0.0–0.1)
Basophils Relative: 1 %
Eosinophils Absolute: 0.1 10*3/uL (ref 0.0–0.5)
Eosinophils Relative: 1 %
HCT: 28.9 % — ABNORMAL LOW (ref 39.0–52.0)
Hemoglobin: 8.7 g/dL — ABNORMAL LOW (ref 13.0–17.0)
Immature Granulocytes: 0 %
Lymphocytes Relative: 28 %
Lymphs Abs: 2.7 10*3/uL (ref 0.7–4.0)
MCH: 25.4 pg — ABNORMAL LOW (ref 26.0–34.0)
MCHC: 30.1 g/dL (ref 30.0–36.0)
MCV: 84.3 fL (ref 80.0–100.0)
Monocytes Absolute: 0.9 10*3/uL (ref 0.1–1.0)
Monocytes Relative: 10 %
Neutro Abs: 5.9 10*3/uL (ref 1.7–7.7)
Neutrophils Relative %: 60 %
Platelet Count: 342 10*3/uL (ref 150–400)
RBC: 3.43 MIL/uL — ABNORMAL LOW (ref 4.22–5.81)
RDW: 23.6 % — ABNORMAL HIGH (ref 11.5–15.5)
WBC Count: 9.6 10*3/uL (ref 4.0–10.5)
nRBC: 0 % (ref 0.0–0.2)

## 2018-11-18 LAB — SAMPLE TO BLOOD BANK

## 2018-11-18 MED ORDER — DEXTROSE 5 % IV SOLN
20.0000 mg/m2 | Freq: Once | INTRAVENOUS | Status: DC
  Filled 2018-11-18: qty 4

## 2018-11-18 MED ORDER — SODIUM CHLORIDE 0.9% FLUSH
10.0000 mL | INTRAVENOUS | Status: DC | PRN
  Administered 2018-11-18: 08:00:00 10 mL via INTRAVENOUS
  Filled 2018-11-18: qty 10

## 2018-11-18 MED ORDER — DEXAMETHASONE SODIUM PHOSPHATE 10 MG/ML IJ SOLN
INTRAMUSCULAR | Status: AC
  Filled 2018-11-18: qty 1

## 2018-11-18 MED ORDER — SODIUM CHLORIDE 0.9 % IV SOLN
Freq: Once | INTRAVENOUS | Status: AC
  Administered 2018-11-18: 09:00:00 via INTRAVENOUS
  Filled 2018-11-18: qty 250

## 2018-11-18 MED ORDER — DIPHENHYDRAMINE HCL 50 MG/ML IJ SOLN
25.0000 mg | Freq: Once | INTRAMUSCULAR | Status: AC
  Administered 2018-11-18: 25 mg via INTRAVENOUS

## 2018-11-18 MED ORDER — HEPARIN SOD (PORK) LOCK FLUSH 100 UNIT/ML IV SOLN
500.0000 [IU] | Freq: Once | INTRAVENOUS | Status: AC | PRN
  Administered 2018-11-18: 500 [IU]
  Filled 2018-11-18: qty 5

## 2018-11-18 MED ORDER — SODIUM CHLORIDE 0.9 % IV SOLN
20.0000 mg/m2 | Freq: Once | INTRAVENOUS | Status: AC
  Administered 2018-11-18: 10:00:00 40 mg via INTRAVENOUS
  Filled 2018-11-18: qty 4

## 2018-11-18 MED ORDER — LEUPROLIDE ACETATE (4 MONTH) 30 MG IM KIT
30.0000 mg | PACK | Freq: Once | INTRAMUSCULAR | Status: AC
  Administered 2018-11-18: 11:00:00 30 mg via INTRAMUSCULAR
  Filled 2018-11-18: qty 30

## 2018-11-18 MED ORDER — SODIUM CHLORIDE 0.9 % IV SOLN
20.0000 mg | Freq: Once | INTRAVENOUS | Status: AC
  Administered 2018-11-18: 09:00:00 20 mg via INTRAVENOUS
  Filled 2018-11-18: qty 2

## 2018-11-18 MED ORDER — DEXAMETHASONE SODIUM PHOSPHATE 10 MG/ML IJ SOLN
10.0000 mg | Freq: Once | INTRAMUSCULAR | Status: AC
  Administered 2018-11-18: 10 mg via INTRAVENOUS

## 2018-11-18 MED ORDER — SODIUM CHLORIDE 0.9% FLUSH
10.0000 mL | INTRAVENOUS | Status: DC | PRN
  Administered 2018-11-18: 11:00:00 10 mL
  Filled 2018-11-18: qty 10

## 2018-11-18 MED ORDER — DIPHENHYDRAMINE HCL 50 MG/ML IJ SOLN
INTRAMUSCULAR | Status: AC
  Filled 2018-11-18: qty 1

## 2018-11-18 NOTE — Progress Notes (Signed)
Hematology and Oncology Follow Up Visit  Alec Harris [id number] 11-04-1945 73 y.o. 11/18/2018 8:28 AM Patient, No Pcp PerShadad, Alec Dad, MD   Principle Diagnosis: 47 year old man with advanced prostate cancer with disease to the bone documented in 2013.  He has castration-resistant status at this time.  Prior Therapy:  He underwent a prostatectomy with a pathology that showed prostatic adenocarcinoma. Gleason score 4+4 equals 8, with the tumor involving the margins and the pathologic staging was pT4 N1 with 1 of 2 left pelvic lymph nodes involved.   He was started on adjuvant hormone therapy with Lupron. He developed a rising PSA and bone metastasis.   He was then treated with ketoconazole and prednisone. He developed a rising PSA with a doubling time of just over 6 months. Bone scan in September 2013 showed increased uptake in the right lower lumbar spine at L4 and L5 and abnormal uptake in the mid sternum which had progressed since the prior bone scan.  Zytiga 1000 mg daily started in October 2013.  Discontinued in October 2018 after progression of disease.  Taxotere chemotherapy at 75 mg per meter square started on 06/02/2017.  The dose was reduced to 60 mg/m starting with cycle 3.  He completed 10 cycles of therapy and April 2019.   Xtandi 160 mg daily started in August 2019.  Therapy discontinued in December 2019 after progression of disease.   Current therapy:   Jevtana chemotherapy started on August 04, 2018 and receiving 20 mg per metered square.  He is here for cycle 6 of therapy.    Lupron 30 mg every 4 months with the next injection scheduled for April 2020.     Interim History: Alec Harris is here for a follow-up visit.  Since the last visit, he continues to tolerate chemotherapy without any major complaints.  He denies any excessive nausea, fatigue or tiredness.  He denies any infusion related complications.  Does report some mild diarrhea that is manageable.  Spelt  pain continues to improve and his quality of life has improved on treatment.    He denied any alteration mental status, neuropathy, confusion or dizziness.  Denies any headaches or lethargy.  Denies any night sweats, weight loss or changes in appetite.  Denied orthopnea, dyspnea on exertion or chest discomfort.  Denies shortness of breath, difficulty breathing hemoptysis or cough.  Denies any abdominal distention, nausea, early satiety or dyspepsia.  Denies any hematuria, frequency, dysuria or nocturia.  Denies any skin irritation, dryness or rash.  Denies any ecchymosis or petechiae.  Denies any lymphadenopathy or clotting.  Denies any heat or cold intolerance.  Denies any anxiety or depression.  Remaining review of system is negative.           Medications:   Reviewed today with any changes. Current Outpatient Medications  Medication Sig Dispense Refill  . amLODipine (NORVASC) 5 MG tablet TAKE 1 TABLET (5 MG TOTAL) BY MOUTH DAILY. 90 tablet 3  . aspirin EC 81 MG tablet Take 81 mg by mouth daily.    [name] atorvastatin (LIPITOR) 20 MG tablet TAKE 1 TABLET BY MOUTH EVERY DAY 90 tablet 1  . Calcium Carb-Cholecalciferol (CALCIUM 500 +D) 500-400 MG-UNIT TABS Take 1 tablet by mouth daily.     . cilostazol (PLETAL) 100 MG tablet Take 1 tablet (100 mg total) by mouth 2 (two) times daily. 180 tablet 3  . lidocaine-prilocaine (EMLA) cream Apply 1 application topically as needed. 30 g 0  . megestrol (MEGACE) 400 MG/10ML  suspension Take 10 mLs (400 mg total) by mouth daily. 240 mL 0  . prochlorperazine (COMPAZINE) 10 MG tablet Take 1 tablet (10 mg total) by mouth every 6 (six) hours as needed for nausea or vomiting. 30 tablet 0   No current facility-administered medications for this visit.    Facility-Administered Medications Ordered in Other Visits  Medication Dose Route Frequency Provider Last Rate Last Dose  . sodium chloride flush (NS) 0.9 % injection 10 mL  10 mL Intravenous PRN [name],  MD   10 mL at 11/18/18 9371   His past medical history, family history and social history reviewed today without any noted changes.  Allergies: No Known Allergies   Physical Exam:   Blood pressure 137/68, pulse 85, temperature 97.8 F (36.6 C), temperature source Oral, resp. rate 18, height 5\' 9"  (1.753 m), weight 175 lb 1.6 oz (79.4 kg), SpO2 93 %.    ECOG: 1   General appearance: Comfortable appearing without any discomfort Head: Normocephalic without any trauma Oropharynx: Mucous membranes are moist and pink without any thrush or ulcers. Eyes: Pupils are equal and round reactive to light. Lymph nodes: No cervical, supraclavicular, inguinal or axillary lymphadenopathy.   Heart:regular rate and rhythm.  S1 and S2 without leg edema. Lung: Clear without any rhonchi or wheezes.  No dullness to percussion. Abdomin: Soft, nontender, nondistended with good bowel sounds.  No hepatosplenomegaly. Musculoskeletal: No joint deformity or effusion.  Full range of motion noted. Neurological: No deficits noted on motor, sensory and deep tendon reflex exam. Skin: No petechial rash or dryness.  Appeared moist.            Lab Results: Lab Results  Component Value Date   WBC 11.8 (H) 10/28/2018   HGB 7.9 (L) 10/28/2018   HCT 25.9 (L) 10/28/2018   MCV 83.3 10/28/2018   PLT 299 10/28/2018     Chemistry      Component Value Date/Time   NA 138 10/28/2018 0951   NA 137 08/05/2017 0839   K 4.2 10/28/2018 0951   K 3.9 08/05/2017 0839   CL 112 (H) 10/28/2018 0951   CL 106 02/01/2013 1120   CO2 17 (L) 10/28/2018 0951   CO2 23 08/05/2017 0839   BUN 13 10/28/2018 0951   BUN 12.2 08/05/2017 0839   CREATININE 0.83 10/28/2018 0951   CREATININE 0.85 10/07/2018 0800   CREATININE 0.9 08/05/2017 0839      Component Value Date/Time   CALCIUM 8.4 (L) 10/28/2018 0951   CALCIUM 8.6 08/05/2017 0839   ALKPHOS 191 (H) 10/28/2018 0951   ALKPHOS 146 08/05/2017 0839   AST 8 (L) 10/28/2018  0951   AST 7 (L) 10/07/2018 0800   AST 7 08/05/2017 0839   ALT 7 10/28/2018 0951   ALT <6 10/07/2018 0800   ALT <6 08/05/2017 0839   BILITOT 0.5 10/28/2018 0951   BILITOT 0.5 10/07/2018 0800   BILITOT 0.46 08/05/2017 0839      Results for KAELEN, CAUGHLIN (MRN [id number]) as of 11/18/2018 08:15  Ref. Range 09/16/2018 08:42 10/07/2018 08:00 10/28/2018 10:22  Prostate Specific Ag, Serum Latest Ref Range: 0.0 - 4.0 ng/mL 46.1 (H) 38.8 (H) 29.7 (H)      Impression and Plan:   73 year old man with:  1.  Advanced prostate cancer with disease to the bone documented in 2013.  He has castration-resistant disease and status post therapy as outlined above.   He has tolerated Jevtana chemotherapy without any major complaints.  His PSA  continues to respond and he is improving clinically.  Risks and benefits of continuing this therapy was reviewed today.  Complication associated with this medication were reviewed which include nausea, fatigue, myelosuppression or neuropathy.  After discussion today is agreeable to continue.     2. Androgen deprivation.  Last Lupron given on 08/04/2018.  He will receive Lupron today and repeat in 4 months.  Risks and benefits of continuing this therapy long-term was discussed today.  These include osteoporosis, weight gain among others.  He is agreeable to continue.  3. Bone directed therapy.  I recommended continuing calcium and vitamin D supplements.  Xgeva not given because of dental issues at this time.  4. Anemia: Hemoglobin improved at this time and does not require any transfusion.  His anemia is related to chronic disease and malignancy.  5. Bone pain: Resolved after the start of chemotherapy.  He is no longer requiring any medication.  6. IV access: Port-A-Cath continues to be in use without any issues.  7.  Prognosis and goals of care: His disease is incurable but his performance status is excellent and aggressive therapy is warranted.  8.  Anorexia: No  issues reported with his appetite.  His weight is stable.  9. Followup.  In 3 weeks for the next cycle of chemotherapy.   25 minutes was spent face-to-face with the patient today.  More than 50% was dedicated to reviewing his disease status, laboratory data, addressing complications related to chemotherapy and answering questions regarding plan of care.  Zola Button, MD 4/2/20208:28 AM

## 2018-11-18 NOTE — Telephone Encounter (Signed)
Scheduled appt per 4/2 sch message. °

## 2018-11-18 NOTE — Patient Instructions (Signed)
[facility] Discharge Instructions for Patients Receiving Chemotherapy  Today you received the following chemotherapy agents: Jevtana  To help prevent nausea and vomiting after your treatment, we encourage you to take your nausea medication as directed.   If you develop nausea and vomiting that is not controlled by your nausea medication, call the clinic.   BELOW ARE SYMPTOMS THAT SHOULD BE REPORTED IMMEDIATELY:  *FEVER GREATER THAN 100.5 F  *CHILLS WITH OR WITHOUT FEVER  NAUSEA AND VOMITING THAT IS NOT CONTROLLED WITH YOUR NAUSEA MEDICATION  *UNUSUAL SHORTNESS OF BREATH  *UNUSUAL BRUISING OR BLEEDING  TENDERNESS IN MOUTH AND THROAT WITH OR WITHOUT PRESENCE OF ULCERS  *URINARY PROBLEMS  *BOWEL PROBLEMS  UNUSUAL RASH Items with * indicate a potential emergency and should be followed up as soon as possible.  Feel free to call the clinic should you have any questions or concerns. The clinic phone number is [phone].  Please show the CHEMO ALERT CARD at check-in to the Emergency Department and triage nurse.   

## 2018-11-19 ENCOUNTER — Telehealth: Payer: Self-pay

## 2018-11-19 LAB — PROSTATE-SPECIFIC AG, SERUM (LABCORP): Prostate Specific Ag, Serum: 25.9 ng/mL — ABNORMAL HIGH (ref 0.0–4.0)

## 2018-11-19 NOTE — Telephone Encounter (Signed)
-----   Message from [name], MD sent at 11/19/2018  8:15 AM EDT ----- Please let him know his PSA is down.

## 2018-11-19 NOTE — Telephone Encounter (Signed)
Contacted patient and made aware of PSA results. 

## 2018-11-20 ENCOUNTER — Other Ambulatory Visit: Payer: Self-pay

## 2018-11-20 ENCOUNTER — Inpatient Hospital Stay: Payer: Medicare Other

## 2018-11-20 VITALS — BP 153/68 | HR 76 | Temp 97.9°F | Resp 17

## 2018-11-20 DIAGNOSIS — Z7689 Persons encountering health services in other specified circumstances: Secondary | ICD-10-CM | POA: Diagnosis not present

## 2018-11-20 DIAGNOSIS — C7951 Secondary malignant neoplasm of bone: Secondary | ICD-10-CM | POA: Diagnosis not present

## 2018-11-20 DIAGNOSIS — C61 Malignant neoplasm of prostate: Secondary | ICD-10-CM

## 2018-11-20 DIAGNOSIS — Z79818 Long term (current) use of other agents affecting estrogen receptors and estrogen levels: Secondary | ICD-10-CM | POA: Diagnosis not present

## 2018-11-20 DIAGNOSIS — Z9079 Acquired absence of other genital organ(s): Secondary | ICD-10-CM | POA: Diagnosis not present

## 2018-11-20 DIAGNOSIS — Z5111 Encounter for antineoplastic chemotherapy: Secondary | ICD-10-CM | POA: Diagnosis not present

## 2018-11-20 MED ORDER — PEGFILGRASTIM-CBQV 6 MG/0.6ML ~~LOC~~ SOSY
PREFILLED_SYRINGE | SUBCUTANEOUS | Status: AC
  Filled 2018-11-20: qty 0.6

## 2018-11-20 MED ORDER — PEGFILGRASTIM-CBQV 6 MG/0.6ML ~~LOC~~ SOSY
6.0000 mg | PREFILLED_SYRINGE | Freq: Once | SUBCUTANEOUS | Status: AC
  Administered 2018-11-20: 6 mg via SUBCUTANEOUS

## 2018-11-20 NOTE — Patient Instructions (Signed)
Pegfilgrastim injection  What is this medicine?  PEGFILGRASTIM (PEG fil gra stim) is a long-acting granulocyte colony-stimulating factor that stimulates the growth of neutrophils, a type of white blood cell important in the body's fight against infection. It is used to reduce the incidence of fever and infection in patients with certain types of cancer who are receiving chemotherapy that affects the bone marrow, and to increase survival after being exposed to high doses of radiation.  This medicine may be used for other purposes; ask your health care provider or pharmacist if you have questions.  COMMON BRAND NAME(S): Fulphila, Neulasta, UDENYCA  What should I tell my health care provider before I take this medicine?  They need to know if you have any of these conditions:  -kidney disease  -latex allergy  -ongoing radiation therapy  -sickle cell disease  -skin reactions to acrylic adhesives (On-Body Injector only)  -an unusual or allergic reaction to pegfilgrastim, filgrastim, other medicines, foods, dyes, or preservatives  -pregnant or trying to get pregnant  -breast-feeding  How should I use this medicine?  This medicine is for injection under the skin. If you get this medicine at home, you will be taught how to prepare and give the pre-filled syringe or how to use the On-body Injector. Refer to the patient Instructions for Use for detailed instructions. Use exactly as directed. Tell your healthcare provider immediately if you suspect that the On-body Injector may not have performed as intended or if you suspect the use of the On-body Injector resulted in a missed or partial dose.  It is important that you put your used needles and syringes in a special sharps container. Do not put them in a trash can. If you do not have a sharps container, call your pharmacist or healthcare provider to get one.  Talk to your pediatrician regarding the use of this medicine in children. While this drug may be prescribed for  selected conditions, precautions do apply.  Overdosage: If you think you have taken too much of this medicine contact a poison control center or emergency room at once.  NOTE: This medicine is only for you. Do not share this medicine with others.  What if I miss a dose?  It is important not to miss your dose. Call your doctor or health care professional if you miss your dose. If you miss a dose due to an On-body Injector failure or leakage, a new dose should be administered as soon as possible using a single prefilled syringe for manual use.  What may interact with this medicine?  Interactions have not been studied.  Give your health care provider a list of all the medicines, herbs, non-prescription drugs, or dietary supplements you use. Also tell them if you smoke, drink alcohol, or use illegal drugs. Some items may interact with your medicine.  This list may not describe all possible interactions. Give your health care provider a list of all the medicines, herbs, non-prescription drugs, or dietary supplements you use. Also tell them if you smoke, drink alcohol, or use illegal drugs. Some items may interact with your medicine.  What should I watch for while using this medicine?  You may need blood work done while you are taking this medicine.  If you are going to need a MRI, CT scan, or other procedure, tell your doctor that you are using this medicine (On-Body Injector only).  What side effects may I notice from receiving this medicine?  Side effects that you should report to   your doctor or health care professional as soon as possible:  -allergic reactions like skin rash, itching or hives, swelling of the face, lips, or tongue  -back pain  -dizziness  -fever  -pain, redness, or irritation at site where injected  -pinpoint red spots on the skin  -red or dark-brown urine  -shortness of breath or breathing problems  -stomach or side pain, or pain at the shoulder  -swelling  -tiredness  -trouble passing urine or  change in the amount of urine  Side effects that usually do not require medical attention (report to your doctor or health care professional if they continue or are bothersome):  -bone pain  -muscle pain  This list may not describe all possible side effects. Call your doctor for medical advice about side effects. You may report side effects to FDA at 1-800-FDA-1088.  Where should I keep my medicine?  Keep out of the reach of children.  If you are using this medicine at home, you will be instructed on how to store it. Throw away any unused medicine after the expiration date on the label.  NOTE: This sheet is a summary. It may not cover all possible information. If you have questions about this medicine, talk to your doctor, pharmacist, or health care provider.   2019 Elsevier/Gold Standard (2017-11-09 16:57:08)

## 2018-12-02 ENCOUNTER — Telehealth: Payer: Self-pay

## 2018-12-06 ENCOUNTER — Telehealth: Payer: Self-pay | Admitting: *Deleted

## 2018-12-06 NOTE — Telephone Encounter (Signed)
Patient called to advise that he is having diarrhea.  He reports having 2 stools in a day.  Advised that if he increases to 4 or more stools a day with a watery consistency then he can add Imodium into his medication regiment to help of set diarrhea but that at this point 2 stools a day is ok.

## 2018-12-09 ENCOUNTER — Inpatient Hospital Stay: Payer: Medicare Other

## 2018-12-09 ENCOUNTER — Inpatient Hospital Stay (HOSPITAL_BASED_OUTPATIENT_CLINIC_OR_DEPARTMENT_OTHER): Payer: Medicare Other | Admitting: Oncology

## 2018-12-09 ENCOUNTER — Other Ambulatory Visit: Payer: Self-pay

## 2018-12-09 ENCOUNTER — Telehealth: Payer: Self-pay | Admitting: Oncology

## 2018-12-09 VITALS — Resp 18

## 2018-12-09 VITALS — BP 145/65 | HR 90 | Temp 98.7°F | Resp 8 | Ht 69.0 in | Wt 174.1 lb

## 2018-12-09 DIAGNOSIS — Z95828 Presence of other vascular implants and grafts: Secondary | ICD-10-CM

## 2018-12-09 DIAGNOSIS — D649 Anemia, unspecified: Secondary | ICD-10-CM

## 2018-12-09 DIAGNOSIS — C61 Malignant neoplasm of prostate: Secondary | ICD-10-CM

## 2018-12-09 DIAGNOSIS — Z79899 Other long term (current) drug therapy: Secondary | ICD-10-CM | POA: Diagnosis not present

## 2018-12-09 DIAGNOSIS — D6481 Anemia due to antineoplastic chemotherapy: Secondary | ICD-10-CM

## 2018-12-09 DIAGNOSIS — Z7689 Persons encountering health services in other specified circumstances: Secondary | ICD-10-CM | POA: Diagnosis not present

## 2018-12-09 DIAGNOSIS — T451X5S Adverse effect of antineoplastic and immunosuppressive drugs, sequela: Secondary | ICD-10-CM | POA: Diagnosis not present

## 2018-12-09 DIAGNOSIS — Z9079 Acquired absence of other genital organ(s): Secondary | ICD-10-CM

## 2018-12-09 DIAGNOSIS — Z7982 Long term (current) use of aspirin: Secondary | ICD-10-CM | POA: Diagnosis not present

## 2018-12-09 DIAGNOSIS — Z79818 Long term (current) use of other agents affecting estrogen receptors and estrogen levels: Secondary | ICD-10-CM | POA: Diagnosis not present

## 2018-12-09 DIAGNOSIS — R630 Anorexia: Secondary | ICD-10-CM | POA: Diagnosis not present

## 2018-12-09 DIAGNOSIS — C7951 Secondary malignant neoplasm of bone: Secondary | ICD-10-CM

## 2018-12-09 DIAGNOSIS — Z5111 Encounter for antineoplastic chemotherapy: Secondary | ICD-10-CM | POA: Diagnosis not present

## 2018-12-09 LAB — CBC WITH DIFFERENTIAL (CANCER CENTER ONLY)
Abs Immature Granulocytes: 0.04 10*3/uL (ref 0.00–0.07)
Basophils Absolute: 0 10*3/uL (ref 0.0–0.1)
Basophils Relative: 0 %
Eosinophils Absolute: 0 10*3/uL (ref 0.0–0.5)
Eosinophils Relative: 0 %
HCT: 27.6 % — ABNORMAL LOW (ref 39.0–52.0)
Hemoglobin: 8.5 g/dL — ABNORMAL LOW (ref 13.0–17.0)
Immature Granulocytes: 0 %
Lymphocytes Relative: 20 %
Lymphs Abs: 1.9 10*3/uL (ref 0.7–4.0)
MCH: 25.4 pg — ABNORMAL LOW (ref 26.0–34.0)
MCHC: 30.8 g/dL (ref 30.0–36.0)
MCV: 82.4 fL (ref 80.0–100.0)
Monocytes Absolute: 1.2 10*3/uL — ABNORMAL HIGH (ref 0.1–1.0)
Monocytes Relative: 13 %
Neutro Abs: 6.2 10*3/uL (ref 1.7–7.7)
Neutrophils Relative %: 67 %
Platelet Count: 335 10*3/uL (ref 150–400)
RBC: 3.35 MIL/uL — ABNORMAL LOW (ref 4.22–5.81)
RDW: 23.9 % — ABNORMAL HIGH (ref 11.5–15.5)
WBC Count: 9.4 10*3/uL (ref 4.0–10.5)
nRBC: 0 % (ref 0.0–0.2)

## 2018-12-09 LAB — CMP (CANCER CENTER ONLY)
ALT: 9 U/L (ref 0–44)
AST: 10 U/L — ABNORMAL LOW (ref 15–41)
Albumin: 3.1 g/dL — ABNORMAL LOW (ref 3.5–5.0)
Alkaline Phosphatase: 166 U/L — ABNORMAL HIGH (ref 38–126)
Anion gap: 9 (ref 5–15)
BUN: 13 mg/dL (ref 8–23)
CO2: 17 mmol/L — ABNORMAL LOW (ref 22–32)
Calcium: 8.3 mg/dL — ABNORMAL LOW (ref 8.9–10.3)
Chloride: 111 mmol/L (ref 98–111)
Creatinine: 0.88 mg/dL (ref 0.61–1.24)
GFR, Est AFR Am: >60 mL/min (ref >60)
GFR, Estimated: >60 mL/min (ref >60)
Glucose, Bld: 112 mg/dL — ABNORMAL HIGH (ref 70–99)
Potassium: 3.9 mmol/L (ref 3.5–5.1)
Sodium: 137 mmol/L (ref 135–145)
Total Bilirubin: 0.4 mg/dL (ref 0.3–1.2)
Total Protein: 6.6 g/dL (ref 6.5–8.1)

## 2018-12-09 LAB — SAMPLE TO BLOOD BANK

## 2018-12-09 MED ORDER — SODIUM CHLORIDE 0.9 % IV SOLN
20.0000 mg/m2 | Freq: Once | INTRAVENOUS | Status: AC
  Administered 2018-12-09: 12:00:00 40 mg via INTRAVENOUS
  Filled 2018-12-09: qty 4

## 2018-12-09 MED ORDER — FAMOTIDINE IN NACL 20-0.9 MG/50ML-% IV SOLN
20.0000 mg | Freq: Once | INTRAVENOUS | Status: AC
  Administered 2018-12-09: 11:00:00 20 mg via INTRAVENOUS

## 2018-12-09 MED ORDER — SODIUM CHLORIDE 0.9% FLUSH
10.0000 mL | INTRAVENOUS | Status: DC | PRN
  Administered 2018-12-09: 13:00:00 10 mL
  Filled 2018-12-09: qty 10

## 2018-12-09 MED ORDER — SODIUM CHLORIDE 0.9 % IV SOLN
Freq: Once | INTRAVENOUS | Status: AC
  Administered 2018-12-09: 11:00:00 via INTRAVENOUS
  Filled 2018-12-09: qty 250

## 2018-12-09 MED ORDER — DIPHENHYDRAMINE HCL 50 MG/ML IJ SOLN
INTRAMUSCULAR | Status: AC
  Filled 2018-12-09: qty 1

## 2018-12-09 MED ORDER — PEGFILGRASTIM 6 MG/0.6ML ~~LOC~~ PSKT
6.0000 mg | PREFILLED_SYRINGE | Freq: Once | SUBCUTANEOUS | Status: AC
  Administered 2018-12-09: 13:00:00 6 mg via SUBCUTANEOUS

## 2018-12-09 MED ORDER — DIPHENHYDRAMINE HCL 50 MG/ML IJ SOLN
25.0000 mg | Freq: Once | INTRAMUSCULAR | Status: AC
  Administered 2018-12-09: 11:00:00 25 mg via INTRAVENOUS

## 2018-12-09 MED ORDER — FAMOTIDINE IN NACL 20-0.9 MG/50ML-% IV SOLN
INTRAVENOUS | Status: AC
  Filled 2018-12-09: qty 50

## 2018-12-09 MED ORDER — DEXAMETHASONE SODIUM PHOSPHATE 10 MG/ML IJ SOLN
INTRAMUSCULAR | Status: AC
  Filled 2018-12-09: qty 1

## 2018-12-09 MED ORDER — SODIUM CHLORIDE 0.9% FLUSH
10.0000 mL | INTRAVENOUS | Status: DC | PRN
  Administered 2018-12-09: 10 mL via INTRAVENOUS
  Filled 2018-12-09: qty 10

## 2018-12-09 MED ORDER — HEPARIN SOD (PORK) LOCK FLUSH 100 UNIT/ML IV SOLN
500.0000 [IU] | Freq: Once | INTRAVENOUS | Status: AC | PRN
  Administered 2018-12-09: 13:00:00 500 [IU]
  Filled 2018-12-09: qty 5

## 2018-12-09 MED ORDER — PEGFILGRASTIM 6 MG/0.6ML ~~LOC~~ PSKT
PREFILLED_SYRINGE | SUBCUTANEOUS | Status: AC
  Filled 2018-12-09: qty 0.6

## 2018-12-09 MED ORDER — DEXAMETHASONE SODIUM PHOSPHATE 10 MG/ML IJ SOLN
10.0000 mg | Freq: Once | INTRAMUSCULAR | Status: AC
  Administered 2018-12-09: 10 mg via INTRAVENOUS

## 2018-12-09 NOTE — Telephone Encounter (Signed)
Scheduled appt per 4/23 sch message - added additional cycle per sch message - pt to get an updated schedule in treatment area.

## 2018-12-09 NOTE — Progress Notes (Signed)
..  Neulasta Onpro Recommendation Note  Patient was contacted on 12/09/18 in regards to switching G-CSF therapy to Neulasta Onpro (pegfilgrastim). Patient was educated on the purpose of this proposed change in therapy due to COVID-19 pandemic. Patient was educated about Neulasta Onpro on-body injector and patient will be provided with an educational video while in infusion on their next scheduled date.   Based on the following circumstances, this patient would qualify for need for Neulasta Onpro (pegfilgrastim) therapy:  [x]  G-CSF is clinically necessary and warranted for this patient based on risk of neutropenia per NCCN guidelines.   [x]  Patient would benefit from decreased exposure to healthcare facility due to COVID-19 pandemic.  [x]  Patient medical oncologist preference and recommends for patient to receive Neulasta Onpro (pegfilgrastim).   Final Recommendation:   [x]  Patient agrees to change in therapy. Change to Neulasta Onpro.  []  Patient does not agree to change in therapy. No change to Neulasta Onpro at this time.    Thank You,  Wynona Neat  12/09/2018 1:07 PM

## 2018-12-09 NOTE — Progress Notes (Signed)
Hematology and Oncology Follow Up Visit  Alec Harris [id number] February 12, 1946 73 y.o. 12/09/2018 9:57 AM Patient, No Pcp PerShadad, [name], MD   Principle Diagnosis: 59 year old man with castration-resistant prostate cancer with disease to the bone diagnosed in 2013.   Prior Therapy:  He underwent a prostatectomy with a pathology that showed prostatic adenocarcinoma. Gleason score 4+4 equals 8, with the tumor involving the margins and the pathologic staging was pT4 N1 with 1 of 2 left pelvic lymph nodes involved.   He was started on adjuvant hormone therapy with Lupron. He developed a rising PSA and bone metastasis.   He was then treated with ketoconazole and prednisone. He developed a rising PSA with a doubling time of just over 6 months. Bone scan in September 2013 showed increased uptake in the right lower lumbar spine at L4 and L5 and abnormal uptake in the mid sternum which had progressed since the prior bone scan.  Zytiga 1000 mg daily started in October 2013.  Discontinued in October 2018 after progression of disease.  Taxotere chemotherapy at 75 mg per meter square started on 06/02/2017.  The dose was reduced to 60 mg/m starting with cycle 3.  He completed 10 cycles of therapy and April 2019.   Xtandi 160 mg daily started in August 2019.  Therapy discontinued in December 2019 after progression of disease.   Current therapy:   Jevtana chemotherapy started on August 04, 2018 and receiving 20 mg per metered square.  He completed 6 cycles of therapy and here for evaluation for his next cycle of treatment.   Lupron 30 mg every 4 months with the next injection will be in August 2020.     Interim History: Alec Harris presents today for a repeat evaluation.  Since the last visit, he reports no major issues related to chemotherapy.  He denies nausea, vomiting or infusion related complications.  He did have episode of diarrhea which has resolved at this time.  Is eating better at this  time and overall lost 1 pound.  He denies any bone pain or pathological fractures.  He denies any recent hospitalizations or illnesses.  Patient denied headaches, blurry vision, syncope or seizures.  Denies any fevers, chills or sweats.  Denied chest pain, palpitation, orthopnea or leg edema.  Denied cough, wheezing or hemoptysis.  Denied nausea, vomiting or abdominal pain.  Denies any constipation.  Denies any frequency urgency or hesitancy.  Denies any arthralgias or myalgias.  Denies any skin rashes or lesions.  Denies any bleeding or clotting tendency.  Denies any easy bruising.  Denies any hair or nail changes.  Denies any anxiety or depression.  Remaining review of system is negative.             Medications:   Reviewed today with any changes. Current Outpatient Medications  Medication Sig Dispense Refill  . amLODipine (NORVASC) 5 MG tablet TAKE 1 TABLET (5 MG TOTAL) BY MOUTH DAILY. 90 tablet 3  . aspirin EC 81 MG tablet Take 81 mg by mouth daily.    [name] atorvastatin (LIPITOR) 20 MG tablet TAKE 1 TABLET BY MOUTH EVERY DAY 90 tablet 1  . Calcium Carb-Cholecalciferol (CALCIUM 500 +D) 500-400 MG-UNIT TABS Take 1 tablet by mouth daily.     . cilostazol (PLETAL) 100 MG tablet Take 1 tablet (100 mg total) by mouth 2 (two) times daily. 180 tablet 3  . lidocaine-prilocaine (EMLA) cream Apply 1 application topically as needed. 30 g 0  . megestrol (MEGACE) 400 MG/10ML  suspension Take 10 mLs (400 mg total) by mouth daily. 240 mL 0  . prochlorperazine (COMPAZINE) 10 MG tablet Take 1 tablet (10 mg total) by mouth every 6 (six) hours as needed for nausea or vomiting. 30 tablet 0   No current facility-administered medications for this visit.    Facility-Administered Medications Ordered in Other Visits  Medication Dose Route Frequency Provider Last Rate Last Dose  . sodium chloride flush (NS) 0.9 % injection 10 mL  10 mL Intravenous PRN [name], MD   10 mL at 12/09/18 3295   His past  medical history, family history and social history reviewed today without any noted changes.  Allergies: No Known Allergies   Physical Exam:    Blood pressure (!) 145/65, pulse 90, temperature 98.7 F (37.1 C), temperature source Oral, resp. rate (!) 8, height 5\' 9"  (1.753 m), weight 174 lb 1.6 oz (79 kg), SpO2 96 %.    ECOG: 1    General appearance: Alert, awake without any distress. Head: Atraumatic without abnormalities Oropharynx: Without any thrush or ulcers. Eyes: No scleral icterus. Lymph nodes: No lymphadenopathy noted in the cervical, supraclavicular, or axillary nodes Heart:regular rate and rhythm, without any murmurs or gallops.   Lung: Clear to auscultation without any rhonchi, wheezes or dullness to percussion. Abdomin: Soft, nontender without any shifting dullness or ascites. Musculoskeletal: No clubbing or cyanosis. Neurological: No motor or sensory deficits. Skin: No rashes or lesions.            Lab Results: Lab Results  Component Value Date   WBC 9.6 11/18/2018   HGB 8.7 (L) 11/18/2018   HCT 28.9 (L) 11/18/2018   MCV 84.3 11/18/2018   PLT 342 11/18/2018     Chemistry      Component Value Date/Time   NA 137 11/18/2018 0815   NA 137 08/05/2017 0839   K 3.8 11/18/2018 0815   K 3.9 08/05/2017 0839   CL 111 11/18/2018 0815   CL 106 02/01/2013 1120   CO2 18 (L) 11/18/2018 0815   CO2 23 08/05/2017 0839   BUN 17 11/18/2018 0815   BUN 12.2 08/05/2017 0839   CREATININE 0.91 11/18/2018 0815   CREATININE 0.9 08/05/2017 0839      Component Value Date/Time   CALCIUM 8.6 (L) 11/18/2018 0815   CALCIUM 8.6 08/05/2017 0839   ALKPHOS 193 (H) 11/18/2018 0815   ALKPHOS 146 08/05/2017 0839   AST 9 (L) 11/18/2018 0815   AST 7 08/05/2017 0839   ALT 12 11/18/2018 0815   ALT <6 08/05/2017 0839   BILITOT 0.5 11/18/2018 0815   BILITOT 0.46 08/05/2017 0839       Results for DAMARIO, GILLIE (MRN [id number]) as of 12/09/2018 08:41  Ref. Range 10/28/2018  10:22 11/18/2018 08:15  Prostate Specific Ag, Serum Latest Ref Range: 0.0 - 4.0 ng/mL 29.7 (H) 25.9 (H)      Impression and Plan:   73 year old man with:  1.  Castration-resistant prostate cancer with disease to the bone diagnosed in 2013.     He continues to receive Jevtana with reasonable tolerance and improvement in his overall clinical status and PSA.  His PSA continues to decline currently at 25.9.  Risks and benefits of continuing this treatment was discussed today.  Complications that include infusion related complications, worsening diarrhea and myelosuppression.  He is agreeable to continue at this time as long as he is clinically benefiting.     2. Androgen deprivation.  Long-term complication associated with this treatment  was reviewed.  Including weight gain and osteoporosis.  He is agreeable to continue with next injection scheduled in August 2020.  3. Bone directed therapy.  Continues to be on calcium and vitamin D supplements.  Delton See is deferred based on his preference.  4. Anemia: Related to chemotherapy and malignancy.  Hemoglobin remained stable.  5. Bone pain: No issues reported at this time since the start of chemotherapy.  6. IV access: Port-A-Cath remains in use without any issues.  7.  Prognosis and goals of care: Therapy remains palliative although aggressive measures are warranted at this time given his excellent performance status and reasonable response.  8.  Neutropenia prophylaxis: We will switch his growth factor support to him Onpro to limit his trips if possible.  9. Followup.  Will be in 3 weeks for the start of the next cycle of chemotherapy.   25 minutes was spent face-to-face with the patient today.  More than 50% was spent on reviewing his disease status, laboratory data, discussing treatment options and complications related to therapy.  Zola Button, MD 4/23/20209:57 AM

## 2018-12-10 ENCOUNTER — Other Ambulatory Visit: Payer: Self-pay | Admitting: Oncology

## 2018-12-10 ENCOUNTER — Telehealth: Payer: Self-pay

## 2018-12-10 LAB — PROSTATE-SPECIFIC AG, SERUM (LABCORP): Prostate Specific Ag, Serum: 20.9 ng/mL — ABNORMAL HIGH (ref 0.0–4.0)

## 2018-12-10 NOTE — Telephone Encounter (Signed)
-----   Message from [name], MD sent at 12/10/2018  8:55 AM EDT ----- Please let him know his PSA is down.

## 2018-12-10 NOTE — Telephone Encounter (Signed)
Contacted patient and made aware of PSA results. 

## 2018-12-11 ENCOUNTER — Ambulatory Visit: Payer: Medicare Other

## 2018-12-11 ENCOUNTER — Inpatient Hospital Stay: Payer: Medicare Other

## 2018-12-27 ENCOUNTER — Other Ambulatory Visit: Payer: Self-pay | Admitting: Oncology

## 2018-12-30 ENCOUNTER — Inpatient Hospital Stay: Payer: Medicare Other

## 2018-12-30 ENCOUNTER — Inpatient Hospital Stay: Payer: Medicare Other | Attending: Oncology

## 2018-12-30 ENCOUNTER — Other Ambulatory Visit: Payer: Self-pay

## 2018-12-30 ENCOUNTER — Inpatient Hospital Stay (HOSPITAL_BASED_OUTPATIENT_CLINIC_OR_DEPARTMENT_OTHER): Payer: Medicare Other | Admitting: Oncology

## 2018-12-30 VITALS — BP 120/59 | HR 86 | Temp 98.2°F | Resp 18 | Ht 69.0 in | Wt 176.0 lb

## 2018-12-30 DIAGNOSIS — Z5111 Encounter for antineoplastic chemotherapy: Secondary | ICD-10-CM | POA: Insufficient documentation

## 2018-12-30 DIAGNOSIS — Z79818 Long term (current) use of other agents affecting estrogen receptors and estrogen levels: Secondary | ICD-10-CM | POA: Insufficient documentation

## 2018-12-30 DIAGNOSIS — C7951 Secondary malignant neoplasm of bone: Secondary | ICD-10-CM

## 2018-12-30 DIAGNOSIS — Z7982 Long term (current) use of aspirin: Secondary | ICD-10-CM | POA: Diagnosis not present

## 2018-12-30 DIAGNOSIS — Z7689 Persons encountering health services in other specified circumstances: Secondary | ICD-10-CM | POA: Diagnosis not present

## 2018-12-30 DIAGNOSIS — C61 Malignant neoplasm of prostate: Secondary | ICD-10-CM

## 2018-12-30 DIAGNOSIS — Z79899 Other long term (current) drug therapy: Secondary | ICD-10-CM | POA: Diagnosis not present

## 2018-12-30 DIAGNOSIS — D649 Anemia, unspecified: Secondary | ICD-10-CM

## 2018-12-30 DIAGNOSIS — Z95828 Presence of other vascular implants and grafts: Secondary | ICD-10-CM

## 2018-12-30 DIAGNOSIS — Z9079 Acquired absence of other genital organ(s): Secondary | ICD-10-CM | POA: Diagnosis not present

## 2018-12-30 LAB — CMP (CANCER CENTER ONLY)
ALT: 15 U/L (ref 0–44)
AST: 12 U/L — ABNORMAL LOW (ref 15–41)
Albumin: 3.1 g/dL — ABNORMAL LOW (ref 3.5–5.0)
Alkaline Phosphatase: 140 U/L — ABNORMAL HIGH (ref 38–126)
Anion gap: 9 (ref 5–15)
BUN: 19 mg/dL (ref 8–23)
CO2: 17 mmol/L — ABNORMAL LOW (ref 22–32)
Calcium: 8.5 mg/dL — ABNORMAL LOW (ref 8.9–10.3)
Chloride: 111 mmol/L (ref 98–111)
Creatinine: 0.97 mg/dL (ref 0.61–1.24)
GFR, Est AFR Am: >60 mL/min (ref >60)
GFR, Estimated: >60 mL/min (ref >60)
Glucose, Bld: 138 mg/dL — ABNORMAL HIGH (ref 70–99)
Potassium: 3.7 mmol/L (ref 3.5–5.1)
Sodium: 137 mmol/L (ref 135–145)
Total Bilirubin: 0.4 mg/dL (ref 0.3–1.2)
Total Protein: 6.8 g/dL (ref 6.5–8.1)

## 2018-12-30 LAB — CBC WITH DIFFERENTIAL (CANCER CENTER ONLY)
Abs Immature Granulocytes: 0.04 10*3/uL (ref 0.00–0.07)
Basophils Absolute: 0.1 10*3/uL (ref 0.0–0.1)
Basophils Relative: 1 %
Eosinophils Absolute: 0 10*3/uL (ref 0.0–0.5)
Eosinophils Relative: 0 %
HCT: 28.9 % — ABNORMAL LOW (ref 39.0–52.0)
Hemoglobin: 8.8 g/dL — ABNORMAL LOW (ref 13.0–17.0)
Immature Granulocytes: 0 %
Lymphocytes Relative: 25 %
Lymphs Abs: 2.3 10*3/uL (ref 0.7–4.0)
MCH: 25.7 pg — ABNORMAL LOW (ref 26.0–34.0)
MCHC: 30.4 g/dL (ref 30.0–36.0)
MCV: 84.3 fL (ref 80.0–100.0)
Monocytes Absolute: 1 10*3/uL (ref 0.1–1.0)
Monocytes Relative: 11 %
Neutro Abs: 6 10*3/uL (ref 1.7–7.7)
Neutrophils Relative %: 63 %
Platelet Count: 272 10*3/uL (ref 150–400)
RBC: 3.43 MIL/uL — ABNORMAL LOW (ref 4.22–5.81)
RDW: 23.8 % — ABNORMAL HIGH (ref 11.5–15.5)
WBC Count: 9.4 10*3/uL (ref 4.0–10.5)
nRBC: 0 % (ref 0.0–0.2)

## 2018-12-30 LAB — SAMPLE TO BLOOD BANK

## 2018-12-30 MED ORDER — FAMOTIDINE IN NACL 20-0.9 MG/50ML-% IV SOLN
20.0000 mg | Freq: Once | INTRAVENOUS | Status: AC
  Administered 2018-12-30: 09:00:00 20 mg via INTRAVENOUS

## 2018-12-30 MED ORDER — SODIUM CHLORIDE 0.9 % IV SOLN
Freq: Once | INTRAVENOUS | Status: AC
  Administered 2018-12-30: 09:00:00 via INTRAVENOUS
  Filled 2018-12-30: qty 250

## 2018-12-30 MED ORDER — DIPHENHYDRAMINE HCL 50 MG/ML IJ SOLN
INTRAMUSCULAR | Status: AC
  Filled 2018-12-30: qty 1

## 2018-12-30 MED ORDER — DEXAMETHASONE SODIUM PHOSPHATE 10 MG/ML IJ SOLN
INTRAMUSCULAR | Status: AC
  Filled 2018-12-30: qty 1

## 2018-12-30 MED ORDER — PEGFILGRASTIM 6 MG/0.6ML ~~LOC~~ PSKT
6.0000 mg | PREFILLED_SYRINGE | Freq: Once | SUBCUTANEOUS | Status: AC
  Administered 2018-12-30: 11:00:00 6 mg via SUBCUTANEOUS

## 2018-12-30 MED ORDER — PEGFILGRASTIM 6 MG/0.6ML ~~LOC~~ PSKT
PREFILLED_SYRINGE | SUBCUTANEOUS | Status: AC
  Filled 2018-12-30: qty 0.6

## 2018-12-30 MED ORDER — SODIUM CHLORIDE 0.9% FLUSH
10.0000 mL | INTRAVENOUS | Status: DC | PRN
  Administered 2018-12-30: 11:00:00 10 mL
  Filled 2018-12-30: qty 10

## 2018-12-30 MED ORDER — SODIUM CHLORIDE 0.9 % IV SOLN
20.0000 mg/m2 | Freq: Once | INTRAVENOUS | Status: AC
  Administered 2018-12-30: 10:00:00 40 mg via INTRAVENOUS
  Filled 2018-12-30: qty 4

## 2018-12-30 MED ORDER — DEXAMETHASONE SODIUM PHOSPHATE 10 MG/ML IJ SOLN
10.0000 mg | Freq: Once | INTRAMUSCULAR | Status: AC
  Administered 2018-12-30: 10 mg via INTRAVENOUS

## 2018-12-30 MED ORDER — SODIUM CHLORIDE 0.9% FLUSH
10.0000 mL | INTRAVENOUS | Status: DC | PRN
  Administered 2018-12-30: 10 mL via INTRAVENOUS
  Filled 2018-12-30: qty 10

## 2018-12-30 MED ORDER — DIPHENHYDRAMINE HCL 50 MG/ML IJ SOLN
25.0000 mg | Freq: Once | INTRAMUSCULAR | Status: AC
  Administered 2018-12-30: 09:00:00 25 mg via INTRAVENOUS

## 2018-12-30 MED ORDER — HEPARIN SOD (PORK) LOCK FLUSH 100 UNIT/ML IV SOLN
500.0000 [IU] | Freq: Once | INTRAVENOUS | Status: AC | PRN
  Administered 2018-12-30: 11:00:00 500 [IU]
  Filled 2018-12-30: qty 5

## 2018-12-30 MED ORDER — FAMOTIDINE IN NACL 20-0.9 MG/50ML-% IV SOLN
INTRAVENOUS | Status: AC
  Filled 2018-12-30: qty 50

## 2018-12-30 NOTE — Progress Notes (Signed)
Hematology and Oncology Follow Up Visit  Alec Harris [id number] 12-Feb-1946 73 y.o. 12/30/2018 8:17 AM Patient, No Pcp PerShadad, Alec Dad, MD   Principle Diagnosis: 14 year old man with advanced prostate cancer with disease to the bone diagnosed in 2013.  He has castration-resistant disease at this time.  Prior Therapy:  He underwent a prostatectomy with a pathology that showed prostatic adenocarcinoma. Gleason score 4+4 equals 8, with the tumor involving the margins and the pathologic staging was pT4 N1 with 1 of 2 left pelvic lymph nodes involved.   He was started on adjuvant hormone therapy with Lupron. He developed a rising PSA and bone metastasis.   He was then treated with ketoconazole and prednisone. He developed a rising PSA with a doubling time of just over 6 months. Bone scan in September 2013 showed increased uptake in the right lower lumbar spine at L4 and L5 and abnormal uptake in the mid sternum which had progressed since the prior bone scan.  Zytiga 1000 mg daily started in October 2013.  Discontinued in October 2018 after progression of disease.  Taxotere chemotherapy at 75 mg per meter square started on 06/02/2017.  The dose was reduced to 60 mg/m starting with cycle 3.  He completed 10 cycles of therapy and April 2019.   Xtandi 160 mg daily started in August 2019.  Therapy discontinued in December 2019 after progression of disease.   Current therapy:   Jevtana chemotherapy started on August 04, 2018 and receiving 20 mg/m.  He is here for cycle 8.   Lupron 30 mg every 4 months with the next injection will be in August 2020.     Interim History: [name] returns today for a follow-up visit.  Since the last visit, he reports no major changes in his health.  He denies any new complications related to chemotherapy.  He denies any nausea, vomiting or worsening neuropathy.  He denies any excessive fatigue or tiredness.  Continues to drive himself without any decline.   His appetite remain excellent and gained 2 pounds.  He is no longer reporting any bone pain or requiring any pain medication.   He denied any alteration mental status, neuropathy, confusion or dizziness.  Denies any headaches or lethargy.  Denies any night sweats, weight loss or changes in appetite.  Denied orthopnea, dyspnea on exertion or chest discomfort.  Denies shortness of breath, difficulty breathing hemoptysis or cough.  Denies any abdominal distention, nausea, early satiety or dyspepsia.  Denies any hematuria, frequency, dysuria or nocturia.  Denies any skin irritation, dryness or rash.  Denies any ecchymosis or petechiae.  Denies any lymphadenopathy or clotting.  Denies any heat or cold intolerance.  Denies any anxiety or depression.  Remaining review of system is negative.               Medications:   Reviewed today with any changes. Current Outpatient Medications  Medication Sig Dispense Refill  . amLODipine (NORVASC) 5 MG tablet TAKE 1 TABLET (5 MG TOTAL) BY MOUTH DAILY. 90 tablet 3  . aspirin EC 81 MG tablet Take 81 mg by mouth daily.    [name] atorvastatin (LIPITOR) 20 MG tablet TAKE 1 TABLET BY MOUTH EVERY DAY 90 tablet 1  . Calcium Carb-Cholecalciferol (CALCIUM 500 +D) 500-400 MG-UNIT TABS Take 1 tablet by mouth daily.     . cilostazol (PLETAL) 100 MG tablet Take 1 tablet (100 mg total) by mouth 2 (two) times daily. 180 tablet 3  . lidocaine-prilocaine (EMLA) cream Apply  1 application topically as needed. 30 g 0  . megestrol (MEGACE) 40 MG/ML suspension TAKE 10 MLS (400 MG TOTAL) BY MOUTH DAILY. 240 mL 0  . prochlorperazine (COMPAZINE) 10 MG tablet Take 1 tablet (10 mg total) by mouth every 6 (six) hours as needed for nausea or vomiting. 30 tablet 0   No current facility-administered medications for this visit.    Facility-Administered Medications Ordered in Other Visits  Medication Dose Route Frequency Provider Last Rate Last Dose  . sodium chloride flush (NS) 0.9 %  injection 10 mL  10 mL Intravenous PRN [name], MD   10 mL at 12/30/18 0803   His past medical history, family history and social history reviewed today without any noted changes.  Allergies: No Known Allergies   Physical Exam:   Blood pressure (!) 120/59, pulse 86, temperature 98.2 F (36.8 C), temperature source Oral, resp. rate 18, height 5\' 9"  (1.753 m), weight 176 lb (79.8 kg), SpO2 99 %.      ECOG: 1   General appearance: Comfortable appearing without any discomfort Head: Normocephalic without any trauma Oropharynx: Mucous membranes are moist and pink without any thrush or ulcers. Eyes: Pupils are equal and round reactive to light. Lymph nodes: No cervical, supraclavicular, inguinal or axillary lymphadenopathy.   Heart:regular rate and rhythm.  S1 and S2 without leg edema. Lung: Clear without any rhonchi or wheezes.  No dullness to percussion. Abdomin: Soft, nontender, nondistended with good bowel sounds.  No hepatosplenomegaly. Musculoskeletal: No joint deformity or effusion.  Full range of motion noted. Neurological: No deficits noted on motor, sensory and deep tendon reflex exam. Skin: No petechial rash or dryness.  Appeared moist.        Lab Results: Lab Results  Component Value Date   WBC 9.4 12/09/2018   HGB 8.5 (L) 12/09/2018   HCT 27.6 (L) 12/09/2018   MCV 82.4 12/09/2018   PLT 335 12/09/2018     Chemistry      Component Value Date/Time   NA 137 12/09/2018 0927   NA 137 08/05/2017 0839   K 3.9 12/09/2018 0927   K 3.9 08/05/2017 0839   CL 111 12/09/2018 0927   CL 106 02/01/2013 1120   CO2 17 (L) 12/09/2018 0927   CO2 23 08/05/2017 0839   BUN 13 12/09/2018 0927   BUN 12.2 08/05/2017 0839   CREATININE 0.88 12/09/2018 0927   CREATININE 0.9 08/05/2017 0839      Component Value Date/Time   CALCIUM 8.3 (L) 12/09/2018 0927   CALCIUM 8.6 08/05/2017 0839   ALKPHOS 166 (H) 12/09/2018 0927   ALKPHOS 146 08/05/2017 0839   AST 10 (L)  12/09/2018 0927   AST 7 08/05/2017 0839   ALT 9 12/09/2018 0927   ALT <6 08/05/2017 0839   BILITOT 0.4 12/09/2018 0927   BILITOT 0.46 08/05/2017 0839       Results for Alec Harris, Alec Harris (MRN [id number]) as of 12/30/2018 08:20  Ref. Range 11/18/2018 08:15 12/09/2018 09:27  Prostate Specific Ag, Serum Latest Ref Range: 0.0 - 4.0 ng/mL 25.9 (H) 20.9 (H)       Impression and Plan:   73 year old man with:  1.  Advanced prostate cancer with disease to the bone that is currently castration-resistant since 2013.     He is currently on Jevtana without any major complications.  His PSA continues to respond with decline by more than 50%.  Risks and benefits of continuing this treatment was discussed today.  Long-term complications including nausea,  fatigue, neuropathy and myelosuppression.  After discussion today, he is agreeable to continue and the plan tentatively is to complete 10 cycles of therapy.     2. Androgen deprivation.  His next Lupron will be in August 2020.  Complication associated with long-term androgen deprivation including weight gain and osteoporosis were reiterated.  He is agreeable to continue.  3. Bone directed therapy.  I recommended calcium and vitamin D supplements. He declined [place].  4. Anemia: This is due to malignancy and chemotherapy.  His hemoglobin remained stable without any need for transfusion or growth factor support.  5. Bone pain: Very little pain and noted since the start of chemotherapy.  6. IV access: Port-A-Cath has been in use without any issues or complications.  7.  Prognosis and goals of care: His disease is incurable although aggressive therapy is warranted given his reasonable performance status.  8.  Neutropenia prophylaxis: He is at risk for neutropenic fever and will receive Neulasta onpro with each cycle.  9. Follow up:  In 3 weeks for the next cycle of chemotherapy.   25 minutes was spent face-to-face with the patient today.  More than  50% was dedicated to reviewing his disease status, laboratory data, options of therapy and dealing with complications related to treatment.  Zola Button, MD 5/14/20208:17 AM

## 2018-12-30 NOTE — Patient Instructions (Signed)

## 2018-12-31 ENCOUNTER — Telehealth: Payer: Self-pay

## 2018-12-31 ENCOUNTER — Telehealth: Payer: Self-pay | Admitting: Oncology

## 2018-12-31 LAB — PROSTATE-SPECIFIC AG, SERUM (LABCORP): Prostate Specific Ag, Serum: 19.9 ng/mL — ABNORMAL HIGH (ref 0.0–4.0)

## 2018-12-31 NOTE — Telephone Encounter (Signed)
Contacted patient and made aware of the PSA results.

## 2018-12-31 NOTE — Telephone Encounter (Signed)
-----   Message from [name], MD sent at 12/31/2018  8:03 AM EDT ----- Please let him know his PSA is down some.

## 2018-12-31 NOTE — Telephone Encounter (Signed)
Called regarding schedule °

## 2019-01-01 ENCOUNTER — Inpatient Hospital Stay: Payer: Medicare Other

## 2019-01-20 ENCOUNTER — Inpatient Hospital Stay: Payer: Medicare Other

## 2019-01-20 ENCOUNTER — Other Ambulatory Visit: Payer: Self-pay

## 2019-01-20 ENCOUNTER — Encounter: Payer: Self-pay | Admitting: Pharmacist

## 2019-01-20 ENCOUNTER — Inpatient Hospital Stay (HOSPITAL_BASED_OUTPATIENT_CLINIC_OR_DEPARTMENT_OTHER): Payer: Medicare Other | Admitting: Oncology

## 2019-01-20 ENCOUNTER — Telehealth: Payer: Self-pay | Admitting: Oncology

## 2019-01-20 ENCOUNTER — Inpatient Hospital Stay: Payer: Medicare Other | Attending: Oncology

## 2019-01-20 VITALS — BP 129/72 | HR 86 | Temp 98.5°F | Resp 18 | Ht 69.0 in | Wt 174.2 lb

## 2019-01-20 DIAGNOSIS — Z9079 Acquired absence of other genital organ(s): Secondary | ICD-10-CM | POA: Insufficient documentation

## 2019-01-20 DIAGNOSIS — Z95828 Presence of other vascular implants and grafts: Secondary | ICD-10-CM | POA: Insufficient documentation

## 2019-01-20 DIAGNOSIS — Z7982 Long term (current) use of aspirin: Secondary | ICD-10-CM | POA: Diagnosis not present

## 2019-01-20 DIAGNOSIS — C61 Malignant neoplasm of prostate: Secondary | ICD-10-CM

## 2019-01-20 DIAGNOSIS — C7951 Secondary malignant neoplasm of bone: Secondary | ICD-10-CM | POA: Insufficient documentation

## 2019-01-20 DIAGNOSIS — Z79899 Other long term (current) drug therapy: Secondary | ICD-10-CM | POA: Insufficient documentation

## 2019-01-20 DIAGNOSIS — R5383 Other fatigue: Secondary | ICD-10-CM | POA: Diagnosis not present

## 2019-01-20 DIAGNOSIS — Z79818 Long term (current) use of other agents affecting estrogen receptors and estrogen levels: Secondary | ICD-10-CM

## 2019-01-20 DIAGNOSIS — D630 Anemia in neoplastic disease: Secondary | ICD-10-CM | POA: Insufficient documentation

## 2019-01-20 DIAGNOSIS — Z5111 Encounter for antineoplastic chemotherapy: Secondary | ICD-10-CM | POA: Diagnosis not present

## 2019-01-20 DIAGNOSIS — Z7689 Persons encountering health services in other specified circumstances: Secondary | ICD-10-CM | POA: Diagnosis not present

## 2019-01-20 DIAGNOSIS — D649 Anemia, unspecified: Secondary | ICD-10-CM

## 2019-01-20 LAB — CBC WITH DIFFERENTIAL (CANCER CENTER ONLY)
Abs Immature Granulocytes: 0.02 10*3/uL (ref 0.00–0.07)
Basophils Absolute: 0.1 10*3/uL (ref 0.0–0.1)
Basophils Relative: 1 %
Eosinophils Absolute: 0 10*3/uL (ref 0.0–0.5)
Eosinophils Relative: 0 %
HCT: 29.6 % — ABNORMAL LOW (ref 39.0–52.0)
Hemoglobin: 9 g/dL — ABNORMAL LOW (ref 13.0–17.0)
Immature Granulocytes: 0 %
Lymphocytes Relative: 31 %
Lymphs Abs: 2.2 10*3/uL (ref 0.7–4.0)
MCH: 26.2 pg (ref 26.0–34.0)
MCHC: 30.4 g/dL (ref 30.0–36.0)
MCV: 86.3 fL (ref 80.0–100.0)
Monocytes Absolute: 0.9 10*3/uL (ref 0.1–1.0)
Monocytes Relative: 13 %
Neutro Abs: 3.9 10*3/uL (ref 1.7–7.7)
Neutrophils Relative %: 55 %
Platelet Count: 275 10*3/uL (ref 150–400)
RBC: 3.43 MIL/uL — ABNORMAL LOW (ref 4.22–5.81)
RDW: 23.4 % — ABNORMAL HIGH (ref 11.5–15.5)
WBC Count: 7.2 10*3/uL (ref 4.0–10.5)
nRBC: 0 % (ref 0.0–0.2)

## 2019-01-20 LAB — CMP (CANCER CENTER ONLY)
ALT: 8 U/L (ref 0–44)
AST: 9 U/L — ABNORMAL LOW (ref 15–41)
Albumin: 3.4 g/dL — ABNORMAL LOW (ref 3.5–5.0)
Alkaline Phosphatase: 122 U/L (ref 38–126)
Anion gap: 7 (ref 5–15)
BUN: 11 mg/dL (ref 8–23)
CO2: 20 mmol/L — ABNORMAL LOW (ref 22–32)
Calcium: 8.4 mg/dL — ABNORMAL LOW (ref 8.9–10.3)
Chloride: 110 mmol/L (ref 98–111)
Creatinine: 0.94 mg/dL (ref 0.61–1.24)
GFR, Est AFR Am: >60 mL/min (ref >60)
GFR, Estimated: >60 mL/min (ref >60)
Glucose, Bld: 127 mg/dL — ABNORMAL HIGH (ref 70–99)
Potassium: 4.1 mmol/L (ref 3.5–5.1)
Sodium: 137 mmol/L (ref 135–145)
Total Bilirubin: 0.5 mg/dL (ref 0.3–1.2)
Total Protein: 6.6 g/dL (ref 6.5–8.1)

## 2019-01-20 MED ORDER — DEXAMETHASONE SODIUM PHOSPHATE 10 MG/ML IJ SOLN
INTRAMUSCULAR | Status: AC
  Filled 2019-01-20: qty 1

## 2019-01-20 MED ORDER — DEXTROSE 5 % IV SOLN: 20.0000 mg/m2 | Freq: Once | INTRAVENOUS | Status: DC

## 2019-01-20 MED ORDER — DIPHENHYDRAMINE HCL 50 MG/ML IJ SOLN
25.0000 mg | Freq: Once | INTRAMUSCULAR | Status: AC
  Administered 2019-01-20: 25 mg via INTRAVENOUS

## 2019-01-20 MED ORDER — PEGFILGRASTIM 6 MG/0.6ML ~~LOC~~ PSKT
6.0000 mg | PREFILLED_SYRINGE | Freq: Once | SUBCUTANEOUS | Status: AC
  Administered 2019-01-20: 6 mg via SUBCUTANEOUS

## 2019-01-20 MED ORDER — DIPHENHYDRAMINE HCL 50 MG/ML IJ SOLN
INTRAMUSCULAR | Status: AC
  Filled 2019-01-20: qty 1

## 2019-01-20 MED ORDER — FAMOTIDINE IN NACL 20-0.9 MG/50ML-% IV SOLN
INTRAVENOUS | Status: AC
  Filled 2019-01-20: qty 50

## 2019-01-20 MED ORDER — SODIUM CHLORIDE 0.9% FLUSH
10.0000 mL | INTRAVENOUS | Status: DC | PRN
  Administered 2019-01-20: 10 mL
  Filled 2019-01-20: qty 10

## 2019-01-20 MED ORDER — SODIUM CHLORIDE 0.9% FLUSH
10.0000 mL | INTRAVENOUS | Status: DC | PRN
  Administered 2019-01-20: 10 mL via INTRAVENOUS
  Filled 2019-01-20: qty 10

## 2019-01-20 MED ORDER — HEPARIN SOD (PORK) LOCK FLUSH 100 UNIT/ML IV SOLN
500.0000 [IU] | Freq: Once | INTRAVENOUS | Status: AC | PRN
  Administered 2019-01-20: 500 [IU]
  Filled 2019-01-20: qty 5

## 2019-01-20 MED ORDER — PEGFILGRASTIM 6 MG/0.6ML ~~LOC~~ PSKT
PREFILLED_SYRINGE | SUBCUTANEOUS | Status: AC
  Filled 2019-01-20: qty 0.6

## 2019-01-20 MED ORDER — DEXAMETHASONE SODIUM PHOSPHATE 10 MG/ML IJ SOLN
10.0000 mg | Freq: Once | INTRAMUSCULAR | Status: AC
  Administered 2019-01-20: 10 mg via INTRAVENOUS

## 2019-01-20 MED ORDER — FAMOTIDINE IN NACL 20-0.9 MG/50ML-% IV SOLN
20.0000 mg | Freq: Once | INTRAVENOUS | Status: AC
  Administered 2019-01-20: 20 mg via INTRAVENOUS

## 2019-01-20 MED ORDER — SODIUM CHLORIDE 0.9 % IV SOLN
Freq: Once | INTRAVENOUS | Status: AC
  Administered 2019-01-20: 10:00:00 via INTRAVENOUS
  Filled 2019-01-20: qty 250

## 2019-01-20 MED ORDER — SODIUM CHLORIDE 0.9 % IV SOLN
20.0000 mg/m2 | Freq: Once | INTRAVENOUS | Status: AC
  Administered 2019-01-20: 40 mg via INTRAVENOUS
  Filled 2019-01-20: qty 4

## 2019-01-20 NOTE — Telephone Encounter (Signed)
Scheduled appt per sch msg. Called and left msg. Mailed printout °

## 2019-01-20 NOTE — Progress Notes (Signed)
Hematology and Oncology Follow Up Visit  Alec Harris [id number] [birth year] 73 y.o. 01/20/2019 9:27 AM Patient, No Pcp PerShadad, [name], MD   Principle Diagnosis: 73 year old man with castration-resistant prostate cancer with disease to the bone diagnosed 2013.   Prior Therapy:  He underwent a prostatectomy with a pathology that showed prostatic adenocarcinoma. Gleason score 4+4 equals 8, with the tumor involving the margins and the pathologic staging was pT4 N1 with 1 of 2 left pelvic lymph nodes involved.   He was started on adjuvant hormone therapy with Lupron. He developed a rising PSA and bone metastasis.   He was then treated with ketoconazole and prednisone. He developed a rising PSA with a doubling time of just over 6 months. Bone scan in September 2013 showed increased uptake in the right lower lumbar spine at L4 and L5 and abnormal uptake in the mid sternum which had progressed since the prior bone scan.  Zytiga 1000 mg daily started in October 2013.  Discontinued in October 2018 after progression of disease.  Taxotere chemotherapy at 75 mg per meter square started on 06/02/2017.  The dose was reduced to 60 mg/m starting with cycle 3.  He completed 10 cycles of therapy and April 2019.   Xtandi 160 mg daily started in August 2019.  Therapy discontinued in December 2019 after progression of disease.   Current therapy:   Jevtana chemotherapy started on August 04, 2018 and receiving 20 mg/m.  He is here for cycle 9.   Lupron 30 mg every 4 months with the next injection will be in August 2020.     Interim History: [name] is here for a repeat evaluation.  Since the last visit, he reports no major changes in his health.  He tolerated the last cycle of chemotherapy without any new complications.  He denies any nausea, vomiting or neuropathy.  He does report loose bowel habits at times but his bowel movements overall regular.  He does report some mild fatigue but is able to  attend activities of daily living.  His appetite remains the same although he has lost 2 pounds and related to dietary habits.  His quality of life remains maintained.   He denied headaches, blurry vision, syncope or seizures.  Denies any fevers, chills or sweats.  Denied chest pain, palpitation, orthopnea or leg edema.  Denied cough, wheezing or hemoptysis.  Denied nausea, vomiting or abdominal pain.  Denies any constipation.  Denies any frequency urgency or hesitancy.  Denies any arthralgias or myalgias.  Denies any skin rashes or lesions.  Denies any bleeding or clotting tendency.  Denies any easy bruising.  Denies any hair or nail changes.  Denies any anxiety or depression.  Remaining review of system is negative.      Medications:   Reviewed today with any changes. Current Outpatient Medications  Medication Sig Dispense Refill  . amLODipine (NORVASC) 5 MG tablet TAKE 1 TABLET (5 MG TOTAL) BY MOUTH DAILY. 90 tablet 3  . aspirin EC 81 MG tablet Take 81 mg by mouth daily.    [name] atorvastatin (LIPITOR) 20 MG tablet TAKE 1 TABLET BY MOUTH EVERY DAY 90 tablet 1  . Calcium Carb-Cholecalciferol (CALCIUM 500 +D) 500-400 MG-UNIT TABS Take 1 tablet by mouth daily.     . cilostazol (PLETAL) 100 MG tablet Take 1 tablet (100 mg total) by mouth 2 (two) times daily. 180 tablet 3  . lidocaine-prilocaine (EMLA) cream Apply 1 application topically as needed. 30 g 0  .  megestrol (MEGACE) 40 MG/ML suspension TAKE 10 MLS (400 MG TOTAL) BY MOUTH DAILY. 240 mL 0  . prochlorperazine (COMPAZINE) 10 MG tablet Take 1 tablet (10 mg total) by mouth every 6 (six) hours as needed for nausea or vomiting. 30 tablet 0   No current facility-administered medications for this visit.    Facility-Administered Medications Ordered in Other Visits  Medication Dose Route Frequency Provider Last Rate Last Dose  . sodium chloride flush (NS) 0.9 % injection 10 mL  10 mL Intravenous PRN [name], MD   10 mL at 01/20/19 0920    His past medical history, family history and social history reviewed today without any noted changes.  Allergies: No Known Allergies   Physical Exam:     Blood pressure 129/72, pulse 86, temperature 98.5 F (36.9 C), temperature source Oral, resp. rate 18, height 5\' 9"  (1.753 m), weight 174 lb 3.2 oz (79 kg), SpO2 98 %.     ECOG: 1    General appearance: Alert, awake without any distress. Head: Atraumatic without abnormalities Oropharynx: Without any thrush or ulcers. Eyes: No scleral icterus. Lymph nodes: No lymphadenopathy noted in the cervical, supraclavicular, or axillary nodes Heart:regular rate and rhythm, without any murmurs or gallops.   Lung: Clear to auscultation without any rhonchi, wheezes or dullness to percussion. Abdomin: Soft, nontender without any shifting dullness or ascites. Musculoskeletal: No clubbing or cyanosis. Neurological: No motor or sensory deficits. Skin: No rashes or lesions.       Lab Results: Lab Results  Component Value Date   WBC 9.4 12/30/2018   HGB 8.8 (L) 12/30/2018   HCT 28.9 (L) 12/30/2018   MCV 84.3 12/30/2018   PLT 272 12/30/2018     Chemistry      Component Value Date/Time   NA 137 12/30/2018 0805   NA 137 08/05/2017 0839   K 3.7 12/30/2018 0805   K 3.9 08/05/2017 0839   CL 111 12/30/2018 0805   CL 106 02/01/2013 1120   CO2 17 (L) 12/30/2018 0805   CO2 23 08/05/2017 0839   BUN 19 12/30/2018 0805   BUN 12.2 08/05/2017 0839   CREATININE 0.97 12/30/2018 0805   CREATININE 0.9 08/05/2017 0839      Component Value Date/Time   CALCIUM 8.5 (L) 12/30/2018 0805   CALCIUM 8.6 08/05/2017 0839   ALKPHOS 140 (H) 12/30/2018 0805   ALKPHOS 146 08/05/2017 0839   AST 12 (L) 12/30/2018 0805   AST 7 08/05/2017 0839   ALT 15 12/30/2018 0805   ALT <6 08/05/2017 0839   BILITOT 0.4 12/30/2018 0805   BILITOT 0.46 08/05/2017 0839      Results for Alec Harris, Alec Harris (MRN [id number]) as of 01/20/2019 09:28  Ref. Range 12/09/2018  09:27 12/30/2018 08:05  Prostate Specific Ag, Serum Latest Ref Range: 0.0 - 4.0 ng/mL 20.9 (H) 19.9 (H)         Impression and Plan:   73 year old man with:  1.  Castration-resistant prostate cancer with disease to the bone diagnosed in 2013.      He remains on Jevtana chemotherapy with reasonable PSA response.  He has also expressed clinical benefit with improvement in his bone pain.  The natural course of his disease as well as risks and benefits of continuing chemotherapy was discussed today and is agreeable to proceed.  The plan is to complete 10 cycles of therapy and assess his response after that.  Given the fact that his PSA is plateauing I will give him a  treatment break after cycle 10 of therapy.     2. Androgen deprivation.  He is currently on Lupron which she has tolerated very well.  He will receive his next injection in August 2020.  Long-term complications were reiterated which include weight gain and hot flashes.   3. Bone directed therapy.  I recommended continuing calcium and vitamin D supplements after declining Xgeva.  4. Anemia: Related to his malignancy as well as chemotherapy.  He has not required any transfusion at this time.  5. Bone pain: Resolved upon the start of chemotherapy.  6. IV access: Port-A-Cath continues to be accessed without any issues.  7.  Prognosis and goals of care: His disease is incurable although aggressive therapy is warranted given his excellent performance status.  8.  Neutropenia prophylaxis: I recommended continuing Neulasta onpro with each cycle.  He has high risk of developing neutropenic sepsis.  9. Follow up: We will be in 3 weeks for evaluation prior to his 10th cycle of therapy.  25 minutes was spent face-to-face with the patient today.  More than 50% was spent on reviewing his disease status, escalated therapy and coordinating his future plan of care.  Zola Button, MD 6/4/20209:27 AM

## 2019-01-20 NOTE — Patient Instructions (Signed)
[facility] Discharge Instructions for Patients Receiving Chemotherapy  Today you received the following chemotherapy agents: Jevtana  To help prevent nausea and vomiting after your treatment, we encourage you to take your nausea medication as directed.   If you develop nausea and vomiting that is not controlled by your nausea medication, call the clinic.   BELOW ARE SYMPTOMS THAT SHOULD BE REPORTED IMMEDIATELY:  *FEVER GREATER THAN 100.5 F  *CHILLS WITH OR WITHOUT FEVER  NAUSEA AND VOMITING THAT IS NOT CONTROLLED WITH YOUR NAUSEA MEDICATION  *UNUSUAL SHORTNESS OF BREATH  *UNUSUAL BRUISING OR BLEEDING  TENDERNESS IN MOUTH AND THROAT WITH OR WITHOUT PRESENCE OF ULCERS  *URINARY PROBLEMS  *BOWEL PROBLEMS  UNUSUAL RASH Items with * indicate a potential emergency and should be followed up as soon as possible.  Feel free to call the clinic should you have any questions or concerns. The clinic phone number is [phone].  Please show the CHEMO ALERT CARD at check-in to the Emergency Department and triage nurse.   

## 2019-01-21 ENCOUNTER — Telehealth: Payer: Self-pay

## 2019-01-21 LAB — SAMPLE TO BLOOD BANK

## 2019-01-21 LAB — PROSTATE-SPECIFIC AG, SERUM (LABCORP): Prostate Specific Ag, Serum: 21.8 ng/mL — ABNORMAL HIGH (ref 0.0–4.0)

## 2019-01-21 NOTE — Telephone Encounter (Signed)
-----   Message from [name], MD sent at 01/21/2019  8:07 AM EDT ----- Please let him know his PSA slightly up but overall stable.

## 2019-01-21 NOTE — Telephone Encounter (Signed)
Contacted patient and made aware of PSA results. 

## 2019-01-27 ENCOUNTER — Other Ambulatory Visit: Payer: Self-pay | Admitting: Pharmacist

## 2019-01-30 ENCOUNTER — Other Ambulatory Visit: Payer: Self-pay | Admitting: Cardiovascular Disease

## 2019-02-10 ENCOUNTER — Inpatient Hospital Stay: Payer: Medicare Other

## 2019-02-10 ENCOUNTER — Other Ambulatory Visit: Payer: Self-pay

## 2019-02-10 ENCOUNTER — Inpatient Hospital Stay (HOSPITAL_BASED_OUTPATIENT_CLINIC_OR_DEPARTMENT_OTHER): Payer: Medicare Other | Admitting: Oncology

## 2019-02-10 ENCOUNTER — Encounter: Payer: Self-pay | Admitting: Oncology

## 2019-02-10 VITALS — BP 123/60 | HR 89 | Temp 98.5°F | Resp 18 | Ht 69.0 in | Wt 173.9 lb

## 2019-02-10 DIAGNOSIS — Z79899 Other long term (current) drug therapy: Secondary | ICD-10-CM | POA: Diagnosis not present

## 2019-02-10 DIAGNOSIS — Z7982 Long term (current) use of aspirin: Secondary | ICD-10-CM

## 2019-02-10 DIAGNOSIS — C61 Malignant neoplasm of prostate: Secondary | ICD-10-CM

## 2019-02-10 DIAGNOSIS — C7951 Secondary malignant neoplasm of bone: Secondary | ICD-10-CM | POA: Diagnosis not present

## 2019-02-10 DIAGNOSIS — Z95828 Presence of other vascular implants and grafts: Secondary | ICD-10-CM

## 2019-02-10 DIAGNOSIS — R5383 Other fatigue: Secondary | ICD-10-CM | POA: Diagnosis not present

## 2019-02-10 DIAGNOSIS — Z79818 Long term (current) use of other agents affecting estrogen receptors and estrogen levels: Secondary | ICD-10-CM

## 2019-02-10 DIAGNOSIS — D649 Anemia, unspecified: Secondary | ICD-10-CM

## 2019-02-10 DIAGNOSIS — R630 Anorexia: Secondary | ICD-10-CM | POA: Diagnosis not present

## 2019-02-10 DIAGNOSIS — Z5111 Encounter for antineoplastic chemotherapy: Secondary | ICD-10-CM | POA: Diagnosis not present

## 2019-02-10 DIAGNOSIS — Z9079 Acquired absence of other genital organ(s): Secondary | ICD-10-CM

## 2019-02-10 DIAGNOSIS — Z7689 Persons encountering health services in other specified circumstances: Secondary | ICD-10-CM | POA: Diagnosis not present

## 2019-02-10 LAB — CBC WITH DIFFERENTIAL (CANCER CENTER ONLY)
Abs Immature Granulocytes: 0.03 10*3/uL (ref 0.00–0.07)
Basophils Absolute: 0.1 10*3/uL (ref 0.0–0.1)
Basophils Relative: 1 %
Eosinophils Absolute: 0 10*3/uL (ref 0.0–0.5)
Eosinophils Relative: 0 %
HCT: 30.5 % — ABNORMAL LOW (ref 39.0–52.0)
Hemoglobin: 9.4 g/dL — ABNORMAL LOW (ref 13.0–17.0)
Immature Granulocytes: 0 %
Lymphocytes Relative: 31 %
Lymphs Abs: 3.1 10*3/uL (ref 0.7–4.0)
MCH: 26.6 pg (ref 26.0–34.0)
MCHC: 30.8 g/dL (ref 30.0–36.0)
MCV: 86.4 fL (ref 80.0–100.0)
Monocytes Absolute: 1.1 10*3/uL — ABNORMAL HIGH (ref 0.1–1.0)
Monocytes Relative: 11 %
Neutro Abs: 5.5 10*3/uL (ref 1.7–7.7)
Neutrophils Relative %: 57 %
Platelet Count: 246 10*3/uL (ref 150–400)
RBC: 3.53 MIL/uL — ABNORMAL LOW (ref 4.22–5.81)
RDW: 23.6 % — ABNORMAL HIGH (ref 11.5–15.5)
WBC Count: 9.7 10*3/uL (ref 4.0–10.5)
nRBC: 0 % (ref 0.0–0.2)

## 2019-02-10 LAB — CMP (CANCER CENTER ONLY)
ALT: 9 U/L (ref 0–44)
AST: 8 U/L — ABNORMAL LOW (ref 15–41)
Albumin: 3.2 g/dL — ABNORMAL LOW (ref 3.5–5.0)
Alkaline Phosphatase: 109 U/L (ref 38–126)
Anion gap: 9 (ref 5–15)
BUN: 14 mg/dL (ref 8–23)
CO2: 18 mmol/L — ABNORMAL LOW (ref 22–32)
Calcium: 8.3 mg/dL — ABNORMAL LOW (ref 8.9–10.3)
Chloride: 111 mmol/L (ref 98–111)
Creatinine: 0.87 mg/dL (ref 0.61–1.24)
GFR, Est AFR Am: >60 mL/min (ref >60)
GFR, Estimated: >60 mL/min (ref >60)
Glucose, Bld: 120 mg/dL — ABNORMAL HIGH (ref 70–99)
Potassium: 3.8 mmol/L (ref 3.5–5.1)
Sodium: 138 mmol/L (ref 135–145)
Total Bilirubin: 0.4 mg/dL (ref 0.3–1.2)
Total Protein: 6.4 g/dL — ABNORMAL LOW (ref 6.5–8.1)

## 2019-02-10 LAB — SAMPLE TO BLOOD BANK

## 2019-02-10 MED ORDER — PEGFILGRASTIM 6 MG/0.6ML ~~LOC~~ PSKT
PREFILLED_SYRINGE | SUBCUTANEOUS | Status: AC
  Filled 2019-02-10: qty 0.6

## 2019-02-10 MED ORDER — FAMOTIDINE IN NACL 20-0.9 MG/50ML-% IV SOLN
INTRAVENOUS | Status: AC
  Filled 2019-02-10: qty 50

## 2019-02-10 MED ORDER — DIPHENHYDRAMINE HCL 50 MG/ML IJ SOLN
25.0000 mg | Freq: Once | INTRAMUSCULAR | Status: AC
  Administered 2019-02-10: 25 mg via INTRAVENOUS

## 2019-02-10 MED ORDER — DEXAMETHASONE SODIUM PHOSPHATE 10 MG/ML IJ SOLN
INTRAMUSCULAR | Status: AC
  Filled 2019-02-10: qty 1

## 2019-02-10 MED ORDER — SODIUM CHLORIDE 0.9% FLUSH
10.0000 mL | INTRAVENOUS | Status: DC | PRN
  Administered 2019-02-10: 10 mL
  Filled 2019-02-10: qty 10

## 2019-02-10 MED ORDER — PEGFILGRASTIM 6 MG/0.6ML ~~LOC~~ PSKT
6.0000 mg | PREFILLED_SYRINGE | Freq: Once | SUBCUTANEOUS | Status: AC
  Administered 2019-02-10: 6 mg via SUBCUTANEOUS

## 2019-02-10 MED ORDER — SODIUM CHLORIDE 0.9 % IV SOLN
Freq: Once | INTRAVENOUS | Status: AC
  Administered 2019-02-10: 10:00:00 via INTRAVENOUS
  Filled 2019-02-10: qty 250

## 2019-02-10 MED ORDER — HEPARIN SOD (PORK) LOCK FLUSH 100 UNIT/ML IV SOLN
500.0000 [IU] | Freq: Once | INTRAVENOUS | Status: AC | PRN
  Administered 2019-02-10: 12:00:00 500 [IU]
  Filled 2019-02-10: qty 5

## 2019-02-10 MED ORDER — DIPHENHYDRAMINE HCL 50 MG/ML IJ SOLN
INTRAMUSCULAR | Status: AC
  Filled 2019-02-10: qty 1

## 2019-02-10 MED ORDER — FAMOTIDINE IN NACL 20-0.9 MG/50ML-% IV SOLN
20.0000 mg | Freq: Once | INTRAVENOUS | Status: AC
  Administered 2019-02-10: 20 mg via INTRAVENOUS

## 2019-02-10 MED ORDER — SODIUM CHLORIDE 0.9% FLUSH
10.0000 mL | INTRAVENOUS | Status: DC | PRN
  Administered 2019-02-10: 08:00:00 10 mL via INTRAVENOUS
  Filled 2019-02-10: qty 10

## 2019-02-10 MED ORDER — SODIUM CHLORIDE 0.9 % IV SOLN
20.0000 mg/m2 | Freq: Once | INTRAVENOUS | Status: AC
  Administered 2019-02-10: 40 mg via INTRAVENOUS
  Filled 2019-02-10: qty 4

## 2019-02-10 MED ORDER — DEXAMETHASONE SODIUM PHOSPHATE 10 MG/ML IJ SOLN
10.0000 mg | Freq: Once | INTRAMUSCULAR | Status: AC
  Administered 2019-02-10: 10 mg via INTRAVENOUS

## 2019-02-10 NOTE — Progress Notes (Signed)
Hematology and Oncology Follow Up Visit  Alec Harris [id number] 1946-03-20 73 y.o. 02/10/2019 8:30 AM Patient, No Pcp PerShadad, [name], Harris   Principle Diagnosis: 51 year old man with advanced prostate cancer diagnosed in 2013.  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  Prior Therapy:  He underwent a prostatectomy with a pathology that showed prostatic adenocarcinoma. Gleason score 4+4 equals 8, with the tumor involving the margins and the pathologic staging was pT4 N1 with 1 of 2 left pelvic lymph nodes involved.   He was started on adjuvant hormone therapy with Lupron. He developed a rising PSA and bone metastasis.   He was then treated with ketoconazole and prednisone. He developed a rising PSA with a doubling time of just over 6 months. Bone scan in September 2013 showed increased uptake in the right lower lumbar spine at L4 and L5 and abnormal uptake in the mid sternum which had progressed since the prior bone scan.  Zytiga 1000 mg daily started in October 2013.  Discontinued in October 2018 after progression of disease.  Taxotere chemotherapy at 75 mg per meter square started on 06/02/2017.  The dose was reduced to 60 mg/m starting with cycle 3.  He completed 10 cycles of therapy and April 2019.   Xtandi 160 mg daily started in August 2019.  Therapy discontinued in December 2019 after progression of disease.   Current therapy:   Jevtana chemotherapy started on August 04, 2018 and receiving 20 mg/m.  He is here for cycle 10.   Lupron 30 mg every 4 months with the next injection will be in August 2020.     Interim History: Alec Harris returns today for a repeat evaluation.  Since the last visit, he tolerated the last cycle of chemotherapy without any complaints.  He denies any nausea, vomiting, neuropathy or worsening fatigue.  He does report loose bowel habits but no consistent diarrhea.  He denies any recent bone pain or pathological fractures.  Performance status  quality of life remains maintained.  Patient denied any alteration mental status, neuropathy, confusion or dizziness.  Denies any headaches or lethargy.  Denies any night sweats, weight loss or changes in appetite.  Denied orthopnea, dyspnea on exertion or chest discomfort.  Denies shortness of breath, difficulty breathing hemoptysis or cough.  Denies any abdominal distention, nausea, early satiety or dyspepsia.  Denies any hematuria, frequency, dysuria or nocturia.  Denies any skin irritation, dryness or rash.  Denies any ecchymosis or petechiae.  Denies any lymphadenopathy or clotting.  Denies any heat or cold intolerance.  Denies any anxiety or depression.  Remaining review of system is negative.      Medications:   Reviewed today with any changes. Current Outpatient Medications  Medication Sig Dispense Refill  . amLODipine (NORVASC) 5 MG tablet TAKE 1 TABLET (5 MG TOTAL) BY MOUTH DAILY. 90 tablet 0  . aspirin EC 81 MG tablet Take 81 mg by mouth daily.    [name] atorvastatin (LIPITOR) 20 MG tablet TAKE 1 TABLET BY MOUTH EVERY DAY 90 tablet 1  . Calcium Carb-Cholecalciferol (CALCIUM 500 +D) 500-400 MG-UNIT TABS Take 1 tablet by mouth daily.     . cilostazol (PLETAL) 100 MG tablet Take 1 tablet (100 mg total) by mouth 2 (two) times daily. 180 tablet 3  . lidocaine-prilocaine (EMLA) cream Apply 1 application topically as needed. 30 g 0  . megestrol (MEGACE) 40 MG/ML suspension TAKE 10 MLS (400 MG TOTAL) BY MOUTH DAILY. 240 mL 0  . prochlorperazine (COMPAZINE)  10 MG tablet Take 1 tablet (10 mg total) by mouth every 6 (six) hours as needed for nausea or vomiting. 30 tablet 0   No current facility-administered medications for this visit.    His past medical history, family history and social history reviewed today without any noted changes.  Allergies: No Known Allergies   Physical Exam:  Blood pressure 123/60, pulse 89, temperature 98.5 F (36.9 C), temperature source Oral, resp. rate 18,  height 5\' 9"  (1.753 m), weight 173 lb 14.4 oz (78.9 kg), SpO2 99 %.    ECOG: 1    General appearance: Comfortable appearing without any discomfort Head: Normocephalic without any trauma Oropharynx: Mucous membranes are moist and pink without any thrush or ulcers. Eyes: Pupils are equal and round reactive to light. Lymph nodes: No cervical, supraclavicular, inguinal or axillary lymphadenopathy.   Heart:regular rate and rhythm.  S1 and S2 without leg edema. Lung: Clear without any rhonchi or wheezes.  No dullness to percussion. Abdomin: Soft, nontender, nondistended with good bowel sounds.  No hepatosplenomegaly. Musculoskeletal: No joint deformity or effusion.  Full range of motion noted. Neurological: No deficits noted on motor, sensory and deep tendon reflex exam. Skin: No petechial rash or dryness.  Appeared moist.          Lab Results: Lab Results  Component Value Date   WBC 7.2 01/20/2019   HGB 9.0 (L) 01/20/2019   HCT 29.6 (L) 01/20/2019   MCV 86.3 01/20/2019   PLT 275 01/20/2019     Chemistry      Component Value Date/Time   NA 137 01/20/2019 0923   NA 137 08/05/2017 0839   K 4.1 01/20/2019 0923   K 3.9 08/05/2017 0839   CL 110 01/20/2019 0923   CL 106 02/01/2013 1120   CO2 20 (L) 01/20/2019 0923   CO2 23 08/05/2017 0839   BUN 11 01/20/2019 0923   BUN 12.2 08/05/2017 0839   CREATININE 0.94 01/20/2019 0923   CREATININE 0.9 08/05/2017 0839      Component Value Date/Time   CALCIUM 8.4 (L) 01/20/2019 0923   CALCIUM 8.6 08/05/2017 0839   ALKPHOS 122 01/20/2019 0923   ALKPHOS 146 08/05/2017 0839   AST 9 (L) 01/20/2019 0923   AST 7 08/05/2017 0839   ALT 8 01/20/2019 0923   ALT <6 08/05/2017 0839   BILITOT 0.5 01/20/2019 0923   BILITOT 0.46 08/05/2017 0839        Results for Alec Harris (MRN [id number]) as of 02/10/2019 08:31  Ref. Range 12/09/2018 09:27 12/30/2018 08:05 01/20/2019 09:23  Prostate Specific Ag, Serum Latest Ref Range: 0.0 - 4.0 ng/mL  20.9 (H) 19.9 (H) 21.8 (H)        Impression and Plan:   73 year old man with:  1.  Advanced prostate cancer with disease to the bone diagnosed 2013.  He has castration-resistant disease at this time.      He continues to tolerate Jevtana chemotherapy without any major complaints at this time.  His PSA has remained relatively stable currently at 21.8.  He is bone disease has been asymptomatic at this time.  Risks and benefits of continuing chemotherapy was discussed today.  Potential complications include nausea, fatigue and myelosuppression with neutropenia was reviewed.  At this time is agreeable to continuing anticipate probably discontinuation of chemotherapy after the next cycle of therapy.  Different salvage therapy may be needed in the future his PSA starts to rise.     2. Androgen deprivation.  He continues to be  on long-term androgen deprivation which she has tolerated.  Next Lupron will be August of this year.  Long-term complications including osteoporosis and weight gain.   3. Bone directed therapy.  He continues to take calcium and vitamin D supplements.  He declined [place].  4. Anemia: His hemoglobin continues to improve slowly.  No need for any transfusion or growth factor support.  His anemia is related to malignancy and chemotherapy.  5. Bone pain: No new issues reported at this time.  He is not requiring any pain medications to start chemotherapy.  6. IV access: Port-A-Cath remains in place and in use without any issues.  7.  Prognosis and goals of care: Therapy remains palliative although his performance status is reasonable to continue with aggressive measures.  8.  Neutropenia prophylaxis: Growth factor support will be needed after each cycle of chemotherapy.  He is at risk of developing neutropenia and sepsis.  9. Follow up: In 3 weeks for the next cycle of therapy.  25 minutes was spent face-to-face with the patient today.  More than 50% was dedicated to  reviewing his laboratory data, disease status update, alternative options and managing future plan of care. Zola Button, Harris 6/25/20208:30 AM

## 2019-02-10 NOTE — Patient Instructions (Signed)
[facility] Discharge Instructions for Patients Receiving Chemotherapy  Today you received the following chemotherapy agents: Jevtana  To help prevent nausea and vomiting after your treatment, we encourage you to take your nausea medication as directed.   If you develop nausea and vomiting that is not controlled by your nausea medication, call the clinic.   BELOW ARE SYMPTOMS THAT SHOULD BE REPORTED IMMEDIATELY:  *FEVER GREATER THAN 100.5 F  *CHILLS WITH OR WITHOUT FEVER  NAUSEA AND VOMITING THAT IS NOT CONTROLLED WITH YOUR NAUSEA MEDICATION  *UNUSUAL SHORTNESS OF BREATH  *UNUSUAL BRUISING OR BLEEDING  TENDERNESS IN MOUTH AND THROAT WITH OR WITHOUT PRESENCE OF ULCERS  *URINARY PROBLEMS  *BOWEL PROBLEMS  UNUSUAL RASH Items with * indicate a potential emergency and should be followed up as soon as possible.  Feel free to call the clinic should you have any questions or concerns. The clinic phone number is [phone].  Please show the CHEMO ALERT CARD at check-in to the Emergency Department and triage nurse.   

## 2019-02-11 ENCOUNTER — Telehealth: Payer: Self-pay

## 2019-02-11 ENCOUNTER — Telehealth: Payer: Self-pay | Admitting: Oncology

## 2019-02-11 LAB — PROSTATE-SPECIFIC AG, SERUM (LABCORP): Prostate Specific Ag, Serum: 20.9 ng/mL — ABNORMAL HIGH (ref 0.0–4.0)

## 2019-02-11 NOTE — Telephone Encounter (Signed)
-----   Message from [name], MD sent at 02/11/2019  9:20 AM EDT ----- Please let him know his PSA changed very little.

## 2019-02-11 NOTE — Telephone Encounter (Signed)
Called patient and let him know that per Dr. [name] Blew, PSA changed very little. Informed patient PSA is 20.9. Patient verbalized understanding.

## 2019-02-11 NOTE — Telephone Encounter (Signed)
NO 6/25 LOS

## 2019-03-08 ENCOUNTER — Inpatient Hospital Stay: Payer: Medicare Other | Attending: Oncology

## 2019-03-08 ENCOUNTER — Inpatient Hospital Stay: Payer: Medicare Other

## 2019-03-08 ENCOUNTER — Other Ambulatory Visit: Payer: Self-pay

## 2019-03-08 DIAGNOSIS — Z9221 Personal history of antineoplastic chemotherapy: Secondary | ICD-10-CM | POA: Insufficient documentation

## 2019-03-08 DIAGNOSIS — T451X5S Adverse effect of antineoplastic and immunosuppressive drugs, sequela: Secondary | ICD-10-CM | POA: Insufficient documentation

## 2019-03-08 DIAGNOSIS — C61 Malignant neoplasm of prostate: Secondary | ICD-10-CM

## 2019-03-08 DIAGNOSIS — C7951 Secondary malignant neoplasm of bone: Secondary | ICD-10-CM | POA: Diagnosis not present

## 2019-03-08 DIAGNOSIS — D6481 Anemia due to antineoplastic chemotherapy: Secondary | ICD-10-CM | POA: Insufficient documentation

## 2019-03-08 DIAGNOSIS — D630 Anemia in neoplastic disease: Secondary | ICD-10-CM | POA: Insufficient documentation

## 2019-03-08 DIAGNOSIS — Z79818 Long term (current) use of other agents affecting estrogen receptors and estrogen levels: Secondary | ICD-10-CM | POA: Insufficient documentation

## 2019-03-08 DIAGNOSIS — Z7982 Long term (current) use of aspirin: Secondary | ICD-10-CM | POA: Insufficient documentation

## 2019-03-08 DIAGNOSIS — Z79899 Other long term (current) drug therapy: Secondary | ICD-10-CM | POA: Diagnosis not present

## 2019-03-08 DIAGNOSIS — D649 Anemia, unspecified: Secondary | ICD-10-CM

## 2019-03-08 LAB — CBC WITH DIFFERENTIAL (CANCER CENTER ONLY)
Abs Immature Granulocytes: 0.02 10*3/uL (ref 0.00–0.07)
Basophils Absolute: 0.1 10*3/uL (ref 0.0–0.1)
Basophils Relative: 1 %
Eosinophils Absolute: 0 10*3/uL (ref 0.0–0.5)
Eosinophils Relative: 1 %
HCT: 32.5 % — ABNORMAL LOW (ref 39.0–52.0)
Hemoglobin: 10.1 g/dL — ABNORMAL LOW (ref 13.0–17.0)
Immature Granulocytes: 0 %
Lymphocytes Relative: 32 %
Lymphs Abs: 2.5 10*3/uL (ref 0.7–4.0)
MCH: 27.7 pg (ref 26.0–34.0)
MCHC: 31.1 g/dL (ref 30.0–36.0)
MCV: 89 fL (ref 80.0–100.0)
Monocytes Absolute: 1 10*3/uL (ref 0.1–1.0)
Monocytes Relative: 14 %
Neutro Abs: 4 10*3/uL (ref 1.7–7.7)
Neutrophils Relative %: 52 %
Platelet Count: 274 10*3/uL (ref 150–400)
RBC: 3.65 MIL/uL — ABNORMAL LOW (ref 4.22–5.81)
RDW: 23.3 % — ABNORMAL HIGH (ref 11.5–15.5)
WBC Count: 7.6 10*3/uL (ref 4.0–10.5)
nRBC: 0 % (ref 0.0–0.2)

## 2019-03-08 LAB — SAMPLE TO BLOOD BANK

## 2019-03-08 LAB — CMP (CANCER CENTER ONLY)
ALT: 11 U/L (ref 0–44)
AST: 9 U/L — ABNORMAL LOW (ref 15–41)
Albumin: 3.4 g/dL — ABNORMAL LOW (ref 3.5–5.0)
Alkaline Phosphatase: 96 U/L (ref 38–126)
Anion gap: 10 (ref 5–15)
BUN: 17 mg/dL (ref 8–23)
CO2: 17 mmol/L — ABNORMAL LOW (ref 22–32)
Calcium: 8.7 mg/dL — ABNORMAL LOW (ref 8.9–10.3)
Chloride: 113 mmol/L — ABNORMAL HIGH (ref 98–111)
Creatinine: 0.96 mg/dL (ref 0.61–1.24)
GFR, Est AFR Am: >60 mL/min (ref >60)
GFR, Estimated: >60 mL/min (ref >60)
Glucose, Bld: 124 mg/dL — ABNORMAL HIGH (ref 70–99)
Potassium: 4.2 mmol/L (ref 3.5–5.1)
Sodium: 140 mmol/L (ref 135–145)
Total Bilirubin: 0.5 mg/dL (ref 0.3–1.2)
Total Protein: 6.6 g/dL (ref 6.5–8.1)

## 2019-03-09 LAB — PROSTATE-SPECIFIC AG, SERUM (LABCORP): Prostate Specific Ag, Serum: 20.1 ng/mL — ABNORMAL HIGH (ref 0.0–4.0)

## 2019-03-10 ENCOUNTER — Telehealth: Payer: Self-pay | Admitting: Oncology

## 2019-03-10 ENCOUNTER — Other Ambulatory Visit: Payer: Self-pay

## 2019-03-10 ENCOUNTER — Inpatient Hospital Stay (HOSPITAL_BASED_OUTPATIENT_CLINIC_OR_DEPARTMENT_OTHER): Payer: Medicare Other | Admitting: Oncology

## 2019-03-10 VITALS — BP 140/60 | HR 86 | Temp 98.4°F | Resp 18 | Ht 69.0 in | Wt 173.9 lb

## 2019-03-10 DIAGNOSIS — D630 Anemia in neoplastic disease: Secondary | ICD-10-CM | POA: Diagnosis not present

## 2019-03-10 DIAGNOSIS — D6481 Anemia due to antineoplastic chemotherapy: Secondary | ICD-10-CM

## 2019-03-10 DIAGNOSIS — C61 Malignant neoplasm of prostate: Secondary | ICD-10-CM | POA: Diagnosis not present

## 2019-03-10 DIAGNOSIS — Z79899 Other long term (current) drug therapy: Secondary | ICD-10-CM

## 2019-03-10 DIAGNOSIS — Z7982 Long term (current) use of aspirin: Secondary | ICD-10-CM

## 2019-03-10 DIAGNOSIS — Z9221 Personal history of antineoplastic chemotherapy: Secondary | ICD-10-CM

## 2019-03-10 DIAGNOSIS — C7951 Secondary malignant neoplasm of bone: Secondary | ICD-10-CM

## 2019-03-10 DIAGNOSIS — Z79818 Long term (current) use of other agents affecting estrogen receptors and estrogen levels: Secondary | ICD-10-CM | POA: Diagnosis not present

## 2019-03-10 DIAGNOSIS — T451X5S Adverse effect of antineoplastic and immunosuppressive drugs, sequela: Secondary | ICD-10-CM

## 2019-03-10 NOTE — Telephone Encounter (Signed)
Gave avs and calendar ° °

## 2019-03-10 NOTE — Progress Notes (Signed)
Hematology and Oncology Follow Up Visit  Alec Harris [id number] January 22, 1946 73 y.o. 03/10/2019 2:48 PM Patient, No Pcp PerShadad, [name], MD   Principle Diagnosis: 42 year old man with castration-resistant prostate cancer with disease to the bone diagnosed in 2013. [name]  Prior Therapy:  He underwent a prostatectomy with a pathology that showed prostatic adenocarcinoma. Gleason score 4+4 equals 8, with the tumor involving the margins and the pathologic staging was pT4 N1 with 1 of 2 left pelvic lymph nodes involved.   He was started on adjuvant hormone therapy with Lupron. He developed a rising PSA and bone metastasis.   He was then treated with ketoconazole and prednisone. He developed a rising PSA with a doubling time of just over 6 months. Bone scan in September 2013 showed increased uptake in the right lower lumbar spine at L4 and L5 and abnormal uptake in the mid sternum which had progressed since the prior bone scan.  Zytiga 1000 mg daily started in October 2013.  Discontinued in October 2018 after progression of disease.  Taxotere chemotherapy at 75 mg per meter square started on 06/02/2017.  The dose was reduced to 60 mg/m starting with cycle 3.  He completed 10 cycles of therapy and April 2019.   Xtandi 160 mg daily started in August 2019.  Therapy discontinued in December 2019 after progression of disease.  Jevtana chemotherapy started on August 04, 2018 and receiving 20 mg/m.  He completed 6 cycles of therapy in June 2020.   Current therapy:   Lupron 30 mg every 4 months with the next injection will be in August 2020.     Interim History: Alec Harris is here for a follow-up visit.  Since last visit, he completed the last cycle of Jevtana without any residual issues.  He denies any worsening neuropathy, nausea or vomiting.  He denies any diarrhea or pain.  Is sternal pain has resolved with occasional tightness noted periodically.  He continues to be active and attends to  activities of daily living.  He denied headaches, blurry vision, syncope or seizures.  Denies any fevers, chills or sweats.  Denied chest pain, palpitation, orthopnea or leg edema.  Denied cough, wheezing or hemoptysis.  Denied nausea, vomiting or abdominal pain.  Denies any constipation or diarrhea.  Denies any frequency urgency or hesitancy.  Denies any arthralgias or myalgias.  Denies any skin rashes or lesions.  Denies any bleeding or clotting tendency.  Denies any easy bruising.  Denies any hair or nail changes.  Denies any anxiety or depression.  Remaining review of system is negative.         Medications:   Updated without changes. Current Outpatient Medications  Medication Sig Dispense Refill  . amLODipine (NORVASC) 5 MG tablet TAKE 1 TABLET (5 MG TOTAL) BY MOUTH DAILY. 90 tablet 0  . aspirin EC 81 MG tablet Take 81 mg by mouth daily.    [name] atorvastatin (LIPITOR) 20 MG tablet TAKE 1 TABLET BY MOUTH EVERY DAY 90 tablet 1  . Calcium Carb-Cholecalciferol (CALCIUM 500 +D) 500-400 MG-UNIT TABS Take 1 tablet by mouth daily.     . cilostazol (PLETAL) 100 MG tablet Take 1 tablet (100 mg total) by mouth 2 (two) times daily. 180 tablet 3  . lidocaine-prilocaine (EMLA) cream Apply 1 application topically as needed. 30 g 0  . megestrol (MEGACE) 40 MG/ML suspension TAKE 10 MLS (400 MG TOTAL) BY MOUTH DAILY. 240 mL 0  . prochlorperazine (COMPAZINE) 10 MG tablet Take 1 tablet (10  mg total) by mouth every 6 (six) hours as needed for nausea or vomiting. 30 tablet 0   No current facility-administered medications for this visit.    His past medical history, family history and social history discussed today and remain without any change.  Allergies: No Known Allergies   Physical Exam:  Blood pressure 140/60, pulse 86, temperature 98.4 F (36.9 C), temperature source Oral, resp. rate 18, height 5\' 9"  (1.753 m), weight 173 lb 14.4 oz (78.9 kg), SpO2 100 %.     ECOG: 1    General  appearance: Alert, awake without any distress. Head: Atraumatic without abnormalities Oropharynx: Without any thrush or ulcers. Eyes: No scleral icterus. Lymph nodes: No lymphadenopathy noted in the cervical, supraclavicular, or axillary nodes Heart:regular rate and rhythm, without any murmurs or gallops.   Lung: Clear to auscultation without any rhonchi, wheezes or dullness to percussion. Abdomin: Soft, nontender without any shifting dullness or ascites. Musculoskeletal: No clubbing or cyanosis. Neurological: No motor or sensory deficits. Skin: No rashes or lesions. Psychiatric: Mood and affect appeared normal.           Lab Results: Lab Results  Component Value Date   WBC 7.6 03/08/2019   HGB 10.1 (L) 03/08/2019   HCT 32.5 (L) 03/08/2019   MCV 89.0 03/08/2019   PLT 274 03/08/2019     Chemistry      Component Value Date/Time   NA 140 03/08/2019 0834   NA 137 08/05/2017 0839   K 4.2 03/08/2019 0834   K 3.9 08/05/2017 0839   CL 113 (H) 03/08/2019 0834   CL 106 02/01/2013 1120   CO2 17 (L) 03/08/2019 0834   CO2 23 08/05/2017 0839   BUN 17 03/08/2019 0834   BUN 12.2 08/05/2017 0839   CREATININE 0.96 03/08/2019 0834   CREATININE 0.9 08/05/2017 0839      Component Value Date/Time   CALCIUM 8.7 (L) 03/08/2019 0834   CALCIUM 8.6 08/05/2017 0839   ALKPHOS 96 03/08/2019 0834   ALKPHOS 146 08/05/2017 0839   AST 9 (L) 03/08/2019 0834   AST 7 08/05/2017 0839   ALT 11 03/08/2019 0834   ALT <6 08/05/2017 0839   BILITOT 0.5 03/08/2019 0834   BILITOT 0.46 08/05/2017 0839        Results for Alec Harris, Alec Harris (MRN [id number]) as of 03/10/2019 14:41  Ref. Range 01/20/2019 09:23 02/10/2019 08:11 03/08/2019 08:34  Prostate Specific Ag, Serum Latest Ref Range: 0.0 - 4.0 ng/mL 21.8 (H) 20.9 (H) 20.1 (H)         Impression and Plan:   73 year old man with:  1.  Castration-resistant prostate cancer with disease to the bone noted since 2013.  Advanced prostate cancer with  disease to the bone diagnosed 2013.  He has castration-resistant disease at this time.      He completed 10 cycles of Jevtana with PSA plateau around 20 indicating about 50% response by PSA criteria.  He has improved clinically as well with improvement in his bone pain overall.  The natural course of this disease and future treatment options were reviewed.  These would include retreatment with systemic chemotherapy versus off label therapy.  For the time being, he is agreeable to proceed with a treatment break and consideration to restart therapy if he develops symptomatic progression.     2. Androgen deprivation.  He continues to be on Lupron long-term which I recommended continuing indefinitely.  Next injection will be scheduled August 2020.   3. Bone directed therapy.  I recommended continuing calcium and vitamin D supplements.  He declined Xgeva at this time.  He would be at risk of developing skeletal related events which was reiterated to him at this time.  4. Anemia: Related to malignancy and chemotherapy.  His hemoglobin is improved at this time.  5. Bone pain: Resolved after the start of therapy at this time.  6. IV access: Port-A-Cath continues to be flushed periodically and remain in place.  7.  Prognosis and goals of care: His disease is incurable although aggressive measures are warranted given his reasonable performance status.  8. Follow up: in 6 weeks for repeat evaluation   25 minutes was spent face-to-face with the patient today.  More than 50% was spent on updating his disease status, treatment options, reviewing laboratory data and addressing complications related to therapy.   Zola Button, MD 7/23/20202:48 PM

## 2019-04-14 ENCOUNTER — Inpatient Hospital Stay: Payer: Medicare Other

## 2019-04-14 ENCOUNTER — Inpatient Hospital Stay: Payer: Medicare Other | Attending: Oncology

## 2019-04-14 ENCOUNTER — Other Ambulatory Visit: Payer: Self-pay

## 2019-04-14 DIAGNOSIS — Z9079 Acquired absence of other genital organ(s): Secondary | ICD-10-CM | POA: Insufficient documentation

## 2019-04-14 DIAGNOSIS — C7951 Secondary malignant neoplasm of bone: Secondary | ICD-10-CM | POA: Diagnosis not present

## 2019-04-14 DIAGNOSIS — Z79818 Long term (current) use of other agents affecting estrogen receptors and estrogen levels: Secondary | ICD-10-CM | POA: Diagnosis not present

## 2019-04-14 DIAGNOSIS — C61 Malignant neoplasm of prostate: Secondary | ICD-10-CM | POA: Diagnosis not present

## 2019-04-14 DIAGNOSIS — Z452 Encounter for adjustment and management of vascular access device: Secondary | ICD-10-CM | POA: Insufficient documentation

## 2019-04-14 DIAGNOSIS — Z95828 Presence of other vascular implants and grafts: Secondary | ICD-10-CM

## 2019-04-14 LAB — CMP (CANCER CENTER ONLY)
ALT: 6 U/L (ref 0–44)
AST: 9 U/L — ABNORMAL LOW (ref 15–41)
Albumin: 3.2 g/dL — ABNORMAL LOW (ref 3.5–5.0)
Alkaline Phosphatase: 119 U/L (ref 38–126)
Anion gap: 5 (ref 5–15)
BUN: 13 mg/dL (ref 8–23)
CO2: 25 mmol/L (ref 22–32)
Calcium: 8.5 mg/dL — ABNORMAL LOW (ref 8.9–10.3)
Chloride: 106 mmol/L (ref 98–111)
Creatinine: 1.09 mg/dL (ref 0.61–1.24)
GFR, Est AFR Am: >60 mL/min (ref >60)
GFR, Estimated: >60 mL/min (ref >60)
Glucose, Bld: 102 mg/dL — ABNORMAL HIGH (ref 70–99)
Potassium: 4.3 mmol/L (ref 3.5–5.1)
Sodium: 136 mmol/L (ref 135–145)
Total Bilirubin: 0.4 mg/dL (ref 0.3–1.2)
Total Protein: 6.6 g/dL (ref 6.5–8.1)

## 2019-04-14 LAB — CBC WITH DIFFERENTIAL (CANCER CENTER ONLY)
Abs Immature Granulocytes: 0.01 10*3/uL (ref 0.00–0.07)
Basophils Absolute: 0 10*3/uL (ref 0.0–0.1)
Basophils Relative: 1 %
Eosinophils Absolute: 0.1 10*3/uL (ref 0.0–0.5)
Eosinophils Relative: 2 %
HCT: 34.2 % — ABNORMAL LOW (ref 39.0–52.0)
Hemoglobin: 11 g/dL — ABNORMAL LOW (ref 13.0–17.0)
Immature Granulocytes: 0 %
Lymphocytes Relative: 42 %
Lymphs Abs: 2.3 10*3/uL (ref 0.7–4.0)
MCH: 29.1 pg (ref 26.0–34.0)
MCHC: 32.2 g/dL (ref 30.0–36.0)
MCV: 90.5 fL (ref 80.0–100.0)
Monocytes Absolute: 0.6 10*3/uL (ref 0.1–1.0)
Monocytes Relative: 10 %
Neutro Abs: 2.5 10*3/uL (ref 1.7–7.7)
Neutrophils Relative %: 45 %
Platelet Count: 219 10*3/uL (ref 150–400)
RBC: 3.78 MIL/uL — ABNORMAL LOW (ref 4.22–5.81)
RDW: 18.4 % — ABNORMAL HIGH (ref 11.5–15.5)
WBC Count: 5.5 10*3/uL (ref 4.0–10.5)
nRBC: 0 % (ref 0.0–0.2)

## 2019-04-14 MED ORDER — SODIUM CHLORIDE 0.9% FLUSH
10.0000 mL | INTRAVENOUS | Status: DC | PRN
  Administered 2019-04-14: 14:00:00 10 mL via INTRAVENOUS
  Filled 2019-04-14: qty 10

## 2019-04-14 MED ORDER — HEPARIN SOD (PORK) LOCK FLUSH 100 UNIT/ML IV SOLN
500.0000 [IU] | Freq: Once | INTRAVENOUS | Status: AC | PRN
  Administered 2019-04-14: 500 [IU] via INTRAVENOUS
  Filled 2019-04-14: qty 5

## 2019-04-14 NOTE — Patient Instructions (Signed)

## 2019-04-15 ENCOUNTER — Ambulatory Visit (INDEPENDENT_AMBULATORY_CARE_PROVIDER_SITE_OTHER): Payer: Medicare Other | Admitting: Cardiovascular Disease

## 2019-04-15 ENCOUNTER — Encounter: Payer: Self-pay | Admitting: Cardiovascular Disease

## 2019-04-15 VITALS — BP 116/54 | HR 72 | Temp 97.3°F | Ht 69.0 in | Wt 172.0 lb

## 2019-04-15 DIAGNOSIS — E782 Mixed hyperlipidemia: Secondary | ICD-10-CM | POA: Diagnosis not present

## 2019-04-15 DIAGNOSIS — I739 Peripheral vascular disease, unspecified: Secondary | ICD-10-CM | POA: Diagnosis not present

## 2019-04-15 DIAGNOSIS — Z008 Encounter for other general examination: Secondary | ICD-10-CM | POA: Diagnosis not present

## 2019-04-15 DIAGNOSIS — I6529 Occlusion and stenosis of unspecified carotid artery: Secondary | ICD-10-CM | POA: Diagnosis not present

## 2019-04-15 LAB — HEPATIC FUNCTION PANEL
ALT: 6 IU/L (ref 0–44)
AST: 8 IU/L (ref 0–40)
Albumin: 3.9 g/dL (ref 3.7–4.7)
Alkaline Phosphatase: 124 IU/L — ABNORMAL HIGH (ref 39–117)
Bilirubin Total: 0.4 mg/dL (ref 0.0–1.2)
Bilirubin, Direct: 0.13 mg/dL (ref 0.00–0.40)
Total Protein: 6.7 g/dL (ref 6.0–8.5)

## 2019-04-15 LAB — LIPID PANEL
Chol/HDL Ratio: 2.5 ratio (ref 0.0–5.0)
Cholesterol, Total: 137 mg/dL (ref 100–199)
HDL: 54 mg/dL (ref >39)
LDL Calculated: 71 mg/dL (ref 0–99)
Triglycerides: 58 mg/dL (ref 0–149)
VLDL Cholesterol Cal: 12 mg/dL (ref 5–40)

## 2019-04-15 LAB — PROSTATE-SPECIFIC AG, SERUM (LABCORP): Prostate Specific Ag, Serum: 39.5 ng/mL — ABNORMAL HIGH (ref 0.0–4.0)

## 2019-04-15 NOTE — Assessment & Plan Note (Signed)
History of hyperlipidemia on statin therapy.  Is been 2 years since his last lipid profile which we will repeat 

## 2019-04-15 NOTE — Patient Instructions (Addendum)
Medication Instructions:  Your physician recommends that you continue on your current medications as directed. Please refer to the Current Medication list given to you today.  If you need a refill on your cardiac medications before your next appointment, please call your pharmacy.   Lab work: Your physician recommends that you return for lab work today: lipid and liver panels  If you have labs (blood work) drawn today and your tests are completely normal, you will receive your results only by: [name] MyChart Message (if you have MyChart) OR . A paper copy in the mail If you have any lab test that is abnormal or we need to change your treatment, we will call you to review the results.  Testing/Procedures: Your physician has requested that you have a carotid duplex. This test is an ultrasound of the carotid arteries in your neck. It looks at blood flow through these arteries that supply the brain with blood. Allow one hour for this exam. There are no restrictions or special instructions. TO BE SCHEDULED  Your physician has requested that you have a lower or upper extremity arterial duplex. This test is an ultrasound of the arteries in the legs or arms. It looks at arterial blood flow in the legs and arms. Allow one hour for Lower and Upper Arterial scans. There are no restrictions or special instructions. TO BE SCHEDULED  Your physician has requested that you have an ankle brachial index (ABI). During this test an ultrasound and blood pressure cuff are used to evaluate the arteries that supply the arms and legs with blood. Allow thirty minutes for this exam. There are no restrictions or special instructions. TO BE SCHEDULED   Follow-Up: At [facility], you and your health needs are our priority.  As part of our continuing mission to provide you with exceptional heart care, we have created designated Provider Care Teams.  These Care Teams include your primary Cardiologist (physician) and Advanced  Practice Providers (APPs -  Physician Assistants and Nurse Practitioners) who all work together to provide you with the care you need, when you need it. You will need a follow up appointment in 12 months with Dr. Quay Burow.  Please call our office 2 months in advance to schedule this/each appointment.

## 2019-04-15 NOTE — Progress Notes (Signed)
04/15/2019 Alec Harris   06/25/1946  [id number]  Primary Physician Patient, No Pcp Per Primary Cardiologist: Lorretta Harp MD FACP, [place], [place], [place]  HPI:  Alec Harris is a 73 y.o.  mildly overweight married African American male father of 2, grandfather to 2 grandchildren who I last saw in the office  02/05/2018.[name] He was referred to me by Dr. Debara Pickett for PV evaluation. He has severe diffuse vascular disease involving his iliacs, SFAs and carotids. His claudication improved with Pletal. His other problems include hyperlipidemia. He had a negative Myoview Jan 14, 2012. He does continue to smoke. He stopped his Crestor on his own and did have an elevated lipid profile in the past with a total cholesterol of 255, LDL of 169 and HDL of 56. I angiogrammed him on March 02, 2012 revealing total SFAs bilaterally with 3-vessel runoff below the knee, high-grade calcified iliac disease, and bilateral carotid artery stenosis, 95% right and 80% left with a type 2 arch. He underwent elective right carotid endarterectomy by Dr. Annamarie Major on April 08, 2012.over the last 6 months he denies chest pain shortness of breath or claudication. The Pletal has been beneficial. Unfortunately he did not obtain his carotid artery Doppler studies as originally intended. He had followup carotid Dopplers performed in our office on 01/18/13 which revealed a widely patent right carotid endarterectomy site, and progression of disease on the left which had already been documented angiographically to be in the 80% range. He was neurologically asymptomatic. He ultimately underwent elective left internal carotid artery stenting by myself and Dr. Trula Slade 07/05/13. He denies chest pain or shortness of breath but does complain of lifestyle limiting claudication. Lower cineangiography performed 03/02/12 by myself revealed occluded SFAs bilaterally with high-grade calcified right external and left common iliac artery stenosis. His last  Dopplers performed 09/28/14 revealed ABIs in the midpoint 6 range bilaterally with occluded SFAs and high-grade iliacs bilaterally.   Since I saw the patient a year ago he is remained stable.  He denies chest pain, shortness of breath or claudication.  He is sheltering in place and socially distancing.  He continues to smoke 10 cigarettes a day.   Current Meds  Medication Sig   amLODipine (NORVASC) 5 MG tablet TAKE 1 TABLET (5 MG TOTAL) BY MOUTH DAILY.   aspirin EC 81 MG tablet Take 81 mg by mouth daily.   atorvastatin (LIPITOR) 20 MG tablet TAKE 1 TABLET BY MOUTH EVERY DAY   Calcium Carb-Cholecalciferol (CALCIUM 500 +D) 500-400 MG-UNIT TABS Take 1 tablet by mouth daily.    cilostazol (PLETAL) 100 MG tablet Take 1 tablet (100 mg total) by mouth 2 (two) times daily.   lidocaine-prilocaine (EMLA) cream Apply 1 application topically as needed.   megestrol (MEGACE) 40 MG/ML suspension TAKE 10 MLS (400 MG TOTAL) BY MOUTH DAILY.   prochlorperazine (COMPAZINE) 10 MG tablet Take 1 tablet (10 mg total) by mouth every 6 (six) hours as needed for nausea or vomiting.     No Known Allergies  Social History   Socioeconomic History   Marital status: Married    Spouse name: Not on file   Number of children: Not on file   Years of education: Not on file   Highest education level: Not on file  Occupational History   Not on file  Social Needs   Financial resource strain: Not on file   Food insecurity    Worry: Not on file    Inability: Not on file  Transportation needs    Medical: Not on file    Non-medical: Not on file  Tobacco Use   Smoking status: Light Tobacco Smoker    Years: 50.00    Types: Cigarettes   Smokeless tobacco: Never Used   Tobacco comment: pt states that he is going to quit completely; 1/2 pack per day  Substance and Sexual Activity   Alcohol use: Yes    Alcohol/week: 5.0 standard drinks    Types: 3 Cans of beer, 2 Shots of liquor per week   Drug  use: No   Sexual activity: Never    Birth control/protection: None    Comment: smokes 5 cigarettes per day....  Lifestyle   Physical activity    Days per week: Not on file    Minutes per session: Not on file   Stress: Not on file  Relationships   Social connections    Talks on phone: Not on file    Gets together: Not on file    Attends religious service: Not on file    Active member of club or organization: Not on file    Attends meetings of clubs or organizations: Not on file    Relationship status: Not on file   Intimate partner violence    Fear of current or ex partner: Not on file    Emotionally abused: Not on file    Physically abused: Not on file    Forced sexual activity: Not on file  Other Topics Concern   Not on file  Social History Narrative   Not on file     Review of Systems: General: negative for chills, fever, night sweats or weight changes.  Cardiovascular: negative for chest pain, dyspnea on exertion, edema, orthopnea, palpitations, paroxysmal nocturnal dyspnea or shortness of breath Dermatological: negative for rash Respiratory: negative for cough or wheezing Urologic: negative for hematuria Abdominal: negative for nausea, vomiting, diarrhea, bright red blood per rectum, melena, or hematemesis Neurologic: negative for visual changes, syncope, or dizziness All other systems reviewed and are otherwise negative except as noted above.    Blood pressure (!) 116/54, pulse 72, temperature (!) 97.3 F (36.3 C), height 5\' 9"  (1.753 m), weight 172 lb (78 kg).  General appearance: alert and no distress Neck: no adenopathy, no JVD, supple, symmetrical, trachea midline, thyroid not enlarged, symmetric, no tenderness/mass/nodules and Soft bilateral carotid bruits left louder than right Lungs: clear to auscultation bilaterally Heart: regular rate and rhythm, S1, S2 normal, no murmur, click, rub or gallop Extremities: extremities normal, atraumatic, no cyanosis  or edema Pulses: Absent pedal pulses Skin: Skin color, texture, turgor normal. No rashes or lesions Neurologic: Alert and oriented X 3, normal strength and tone. Normal symmetric reflexes. Normal coordination and gait  EKG sinus rhythm at 72 without ST or T wave changes.  I personally reviewed this EKG.  ASSESSMENT AND PLAN:   Occlusion and stenosis of carotid artery without mention of cerebral infarction History of carotid artery disease status post arch angiography by myself 03/02/2012 revealing 95% right and 80% left ICA stenosis.  He subsequently underwent uncomplicated right carotid endarterectomy by Dr. Trula Slade 04/08/2012.  His last Dopplers were 2 years ago.  PVD (peripheral vascular disease) with claudication History of peripheral arterial disease status post angiography by myself 03/02/2012 revealing bilateral iliac disease with occluded SFAs bilaterally and three-vessel runoff.  ABIs run in the 0.6 range.  He denies claudication.  It has been 4 years since his last lower extremity arterial Doppler studies which we  will repeat.  Hyperlipemia History of hyperlipidemia on statin therapy.  Is been 2 years since his last lipid profile which we will repeat.      Lorretta Harp MD FACP,FACC,FAHA, [facility] 04/15/2019 10:06 AM

## 2019-04-15 NOTE — Assessment & Plan Note (Signed)
History of carotid artery disease status post arch angiography by myself 03/02/2012 revealing 95% right and 80% left ICA stenosis.  He subsequently underwent uncomplicated right carotid endarterectomy by Dr. Trula Slade 04/08/2012.  His last Dopplers were 2 years ago.

## 2019-04-15 NOTE — Assessment & Plan Note (Signed)
History of peripheral arterial disease status post angiography by myself 03/02/2012 revealing bilateral iliac disease with occluded SFAs bilaterally and three-vessel runoff.  ABIs run in the 0.6 range.  He denies claudication.  It has been 4 years since his last lower extremity arterial Doppler studies which we will repeat.

## 2019-04-18 ENCOUNTER — Encounter: Payer: Self-pay | Admitting: *Deleted

## 2019-04-19 ENCOUNTER — Inpatient Hospital Stay: Payer: Medicare Other

## 2019-04-19 ENCOUNTER — Inpatient Hospital Stay: Payer: Medicare Other | Attending: Oncology | Admitting: Oncology

## 2019-04-19 ENCOUNTER — Other Ambulatory Visit: Payer: Self-pay

## 2019-04-19 VITALS — BP 131/64 | HR 86 | Temp 98.3°F | Resp 18 | Ht 69.0 in | Wt 175.1 lb

## 2019-04-19 DIAGNOSIS — D649 Anemia, unspecified: Secondary | ICD-10-CM | POA: Insufficient documentation

## 2019-04-19 DIAGNOSIS — Z95828 Presence of other vascular implants and grafts: Secondary | ICD-10-CM | POA: Insufficient documentation

## 2019-04-19 DIAGNOSIS — Z9221 Personal history of antineoplastic chemotherapy: Secondary | ICD-10-CM | POA: Insufficient documentation

## 2019-04-19 DIAGNOSIS — C7951 Secondary malignant neoplasm of bone: Secondary | ICD-10-CM | POA: Diagnosis not present

## 2019-04-19 DIAGNOSIS — Z79818 Long term (current) use of other agents affecting estrogen receptors and estrogen levels: Secondary | ICD-10-CM | POA: Diagnosis not present

## 2019-04-19 DIAGNOSIS — I6529 Occlusion and stenosis of unspecified carotid artery: Secondary | ICD-10-CM

## 2019-04-19 DIAGNOSIS — C61 Malignant neoplasm of prostate: Secondary | ICD-10-CM | POA: Diagnosis not present

## 2019-04-19 DIAGNOSIS — Z7982 Long term (current) use of aspirin: Secondary | ICD-10-CM | POA: Insufficient documentation

## 2019-04-19 MED ORDER — LEUPROLIDE ACETATE (4 MONTH) 30 MG ~~LOC~~ KIT
30.0000 mg | PACK | Freq: Once | SUBCUTANEOUS | Status: AC
  Administered 2019-04-19: 30 mg via SUBCUTANEOUS
  Filled 2019-04-19: qty 30

## 2019-04-19 MED ORDER — LEUPROLIDE ACETATE (4 MONTH) 30 MG IM KIT: 30.0000 mg | PACK | Freq: Once | INTRAMUSCULAR | Status: DC

## 2019-04-19 NOTE — Patient Instructions (Signed)

## 2019-04-19 NOTE — Progress Notes (Signed)
Hematology and Oncology Follow Up Visit  Alec Atkerson [id number] [birth year] 73 y.o. 04/19/2019 12:46 PM Patient, No Pcp PerShadad, [name], MD   Principle Diagnosis: 46 year old man with advanced prostate cancer to the bone that is currently castration-resistant diagnosed in 2013.  Prior Therapy:  He underwent a prostatectomy with a pathology that showed prostatic adenocarcinoma. Gleason score 4+4 equals 8, with the tumor involving the margins and the pathologic staging was pT4 N1 with 1 of 2 left pelvic lymph nodes involved.   He was started on adjuvant hormone therapy with Lupron. He developed a rising PSA and bone metastasis.   He was then treated with ketoconazole and prednisone. He developed a rising PSA with a doubling time of just over 6 months. Bone scan in September 2013 showed increased uptake in the right lower lumbar spine at L4 and L5 and abnormal uptake in the mid sternum which had progressed since the prior bone scan.  Zytiga 1000 mg daily started in October 2013.  Discontinued in October 2018 after progression of disease.  Taxotere chemotherapy at 75 mg per meter square started on 06/02/2017.  The dose was reduced to 60 mg/m starting with cycle 3.  He completed 10 cycles of therapy and April 2019.   Xtandi 160 mg daily started in August 2019.  Therapy discontinued in December 2019 after progression of disease.  Jevtana chemotherapy started on August 04, 2018 and receiving 20 mg/m.  He completed 6 cycles of therapy in June 2020.   Current therapy:   Lupron 30 mg every 4 months with the next injection will be on 04/19/2019.      Interim History: Alec Harris is here for return evaluation.  He reports no changes in his health since the last visit.  He continues to recover from chemotherapy without any recent complaints.  He denies any nausea, fatigue or neuropathy.  He denies any worsening bone pain or pathological fractures.  He does report some soreness in his chest wall  which is related to his metastatic bone disease.    He denied any alteration mental status, neuropathy, confusion or dizziness.  Denies any headaches or lethargy.  Denies any night sweats, weight loss or changes in appetite.  Denied orthopnea, dyspnea on exertion or chest discomfort.  Denies shortness of breath, difficulty breathing hemoptysis or cough.  Denies any abdominal distention, nausea, early satiety or dyspepsia.  Denies any hematuria, frequency, dysuria or nocturia.  Denies any skin irritation, dryness or rash.  Denies any ecchymosis or petechiae.  Denies any lymphadenopathy or clotting.  Denies any heat or cold intolerance.  Denies any anxiety or depression.  Remaining review of system is negative.             Medications: Reviewed without any changes. Current Outpatient Medications  Medication Sig Dispense Refill  . amLODipine (NORVASC) 5 MG tablet TAKE 1 TABLET (5 MG TOTAL) BY MOUTH DAILY. 90 tablet 0  . aspirin EC 81 MG tablet Take 81 mg by mouth daily.    [name] atorvastatin (LIPITOR) 20 MG tablet TAKE 1 TABLET BY MOUTH EVERY DAY 90 tablet 1  . Calcium Carb-Cholecalciferol (CALCIUM 500 +D) 500-400 MG-UNIT TABS Take 1 tablet by mouth daily.     . cilostazol (PLETAL) 100 MG tablet Take 1 tablet (100 mg total) by mouth 2 (two) times daily. 180 tablet 3  . lidocaine-prilocaine (EMLA) cream Apply 1 application topically as needed. 30 g 0  . megestrol (MEGACE) 40 MG/ML suspension TAKE 10 MLS (400 MG  TOTAL) BY MOUTH DAILY. 240 mL 0  . prochlorperazine (COMPAZINE) 10 MG tablet Take 1 tablet (10 mg total) by mouth every 6 (six) hours as needed for nausea or vomiting. 30 tablet 0   No current facility-administered medications for this visit.    His past medical history, family history and social history updated on review. Allergies: No Known Allergies   Physical Exam:   Blood pressure 131/64, pulse 86, temperature 98.3 F (36.8 C), temperature source Oral, resp. rate 18, height  5\' 9"  (1.753 m), weight 175 lb 1.6 oz (79.4 kg), SpO2 97 %.    ECOG: 1    General appearance: Comfortable appearing without any discomfort Head: Normocephalic without any trauma Oropharynx: Mucous membranes are moist and pink without any thrush or ulcers. Eyes: Pupils are equal and round reactive to light. Lymph nodes: No cervical, supraclavicular, inguinal or axillary lymphadenopathy.   Heart:regular rate and rhythm.  S1 and S2 without leg edema. Lung: Clear without any rhonchi or wheezes.  No dullness to percussion. Abdomin: Soft, nontender, nondistended with good bowel sounds.  No hepatosplenomegaly. Musculoskeletal: No joint deformity or effusion.  Full range of motion noted. Neurological: No deficits noted on motor, sensory and deep tendon reflex exam. Skin: No petechial rash or dryness.  Appeared moist.             Lab Results: Lab Results  Component Value Date   WBC 5.5 04/14/2019   HGB 11.0 (L) 04/14/2019   HCT 34.2 (L) 04/14/2019   MCV 90.5 04/14/2019   PLT 219 04/14/2019     Chemistry      Component Value Date/Time   NA 136 04/14/2019 1430   NA 137 08/05/2017 0839   K 4.3 04/14/2019 1430   K 3.9 08/05/2017 0839   CL 106 04/14/2019 1430   CL 106 02/01/2013 1120   CO2 25 04/14/2019 1430   CO2 23 08/05/2017 0839   BUN 13 04/14/2019 1430   BUN 12.2 08/05/2017 0839   CREATININE 1.09 04/14/2019 1430   CREATININE 0.9 08/05/2017 0839      Component Value Date/Time   CALCIUM 8.5 (L) 04/14/2019 1430   CALCIUM 8.6 08/05/2017 0839   ALKPHOS 124 (H) 04/15/2019 1032   ALKPHOS 146 08/05/2017 0839   AST 8 04/15/2019 1032   AST 9 (L) 04/14/2019 1430   AST 7 08/05/2017 0839   ALT 6 04/15/2019 1032   ALT 6 04/14/2019 1430   ALT <6 08/05/2017 0839   BILITOT 0.4 04/15/2019 1032   BILITOT 0.4 04/14/2019 1430   BILITOT 0.46 08/05/2017 0839       Results for Alec Harris, Alec Harris (MRN [id number]) as of 04/19/2019 12:47  Ref. Range 03/08/2019 08:34 04/14/2019 14:30   Prostate Specific Ag, Serum Latest Ref Range: 0.0 - 4.0 ng/mL 20.1 (H) 39.5 (H)           Impression and Plan:   73 year old man with:  1.  Advanced prostate cancer with disease to the bone diagnosed in 2013.  He has castration-resistant disease at this time.  The natural course of this disease was reviewed and future treatment options were reiterated.  He has plateaued on Jevtana chemotherapy with PSA around 20 and currently is up to 39.5.  Options of therapy would be to restart systemic chemotherapy versus consideration for a PARP inhibitor if he is harboring an appropriate mutation versus Xofigo especially if he has predominantly bone disease.  Risks and benefits of all these treatments were reviewed and I recommended repeat staging  work-up in the immediate future and make appropriate referrals for Xofigo if he has bone disease only.    2. Androgen deprivation.  Risks and benefits of continuing Lupron was discussed.  Complications including osteoporosis and hot flashes were reviewed.  He will receive it today and repeat in 4 months.   3. Bone directed therapy.  He is currently on calcium and vitamin D supplements as I recommended continuing for the time being.  4. Anemia: Hemoglobin is adequate at this time after stopping chemotherapy.  5. Bone pain: Minimal at this time with described as soreness.  We will continue to monitor moving forward.  6. IV access: Port-A-Cath remains in place and will be flushed periodically off treatment.  7.  Prognosis and goals of care: Therapy remains palliative although aggressive measures are warranted given his excellent performance status.  8. Follow up: in 6 weeks for repeat evaluation.  25 minutes was spent face-to-face with the patient today.  More than 50% was dedicated to reviewing the natural course of his disease, treatment options, complications of therapy and future plan of care.     Zola Button, MD 9/1/202012:46 PM

## 2019-04-20 ENCOUNTER — Telehealth: Payer: Self-pay | Admitting: Oncology

## 2019-04-20 NOTE — Telephone Encounter (Signed)
Called and spoke with patient. Mailed schedule per patients request

## 2019-04-22 ENCOUNTER — Other Ambulatory Visit: Payer: Self-pay | Admitting: Cardiovascular Disease

## 2019-04-22 DIAGNOSIS — I739 Peripheral vascular disease, unspecified: Secondary | ICD-10-CM

## 2019-04-27 ENCOUNTER — Encounter (HOSPITAL_COMMUNITY): Payer: Self-pay

## 2019-04-27 ENCOUNTER — Other Ambulatory Visit: Payer: Self-pay

## 2019-04-27 ENCOUNTER — Ambulatory Visit (HOSPITAL_COMMUNITY)
Admission: RE | Admit: 2019-04-27 | Discharge: 2019-04-27 | Disposition: A | Discharge status: Home / Self Care | Payer: Medicare Other | Source: Ambulatory Visit | Attending: Oncology | Admitting: Oncology

## 2019-04-27 DIAGNOSIS — C7951 Secondary malignant neoplasm of bone: Secondary | ICD-10-CM | POA: Diagnosis not present

## 2019-04-27 DIAGNOSIS — C61 Malignant neoplasm of prostate: Secondary | ICD-10-CM | POA: Insufficient documentation

## 2019-04-27 HISTORY — DX: Essential (primary) hypertension: I10

## 2019-04-27 MED ORDER — HEPARIN SOD (PORK) LOCK FLUSH 100 UNIT/ML IV SOLN
INTRAVENOUS | Status: AC
  Filled 2019-04-27: qty 5

## 2019-04-27 MED ORDER — HEPARIN SOD (PORK) LOCK FLUSH 100 UNIT/ML IV SOLN
500.0000 [IU] | Freq: Once | INTRAVENOUS | Status: AC
  Administered 2019-04-27: 500 [IU] via INTRAVENOUS

## 2019-04-27 MED ORDER — SODIUM CHLORIDE (PF) 0.9 % IJ SOLN
INTRAMUSCULAR | Status: AC
  Filled 2019-04-27: qty 50

## 2019-04-27 MED ORDER — IOHEXOL 300 MG/ML  SOLN
100.0000 mL | Freq: Once | INTRAMUSCULAR | Status: AC | PRN
  Administered 2019-04-27: 10:00:00 100 mL via INTRAVENOUS

## 2019-04-28 ENCOUNTER — Other Ambulatory Visit: Payer: Self-pay | Admitting: Cardiovascular Disease

## 2019-05-02 ENCOUNTER — Encounter (HOSPITAL_COMMUNITY): Payer: Medicare Other

## 2019-05-05 ENCOUNTER — Ambulatory Visit (HOSPITAL_COMMUNITY)
Admission: RE | Admit: 2019-05-05 | Discharge: 2019-05-05 | Disposition: A | Discharge status: Home / Self Care | Payer: Medicare Other | Source: Ambulatory Visit | Attending: Oncology | Admitting: Oncology

## 2019-05-05 ENCOUNTER — Other Ambulatory Visit: Payer: Self-pay

## 2019-05-05 ENCOUNTER — Encounter (HOSPITAL_COMMUNITY)
Admission: RE | Admit: 2019-05-05 | Discharge: 2019-05-05 | Disposition: A | Discharge status: Home / Self Care | Payer: Medicare Other | Source: Ambulatory Visit | Attending: Oncology | Admitting: Oncology

## 2019-05-05 DIAGNOSIS — C61 Malignant neoplasm of prostate: Secondary | ICD-10-CM | POA: Insufficient documentation

## 2019-05-05 MED ORDER — TECHNETIUM TC 99M MEDRONATE IV KIT
20.2000 | PACK | Freq: Once | INTRAVENOUS | Status: AC
  Administered 2019-05-05: 20.2 via INTRAVENOUS

## 2019-05-06 ENCOUNTER — Encounter (HOSPITAL_COMMUNITY): Payer: Medicare Other

## 2019-05-06 ENCOUNTER — Encounter (HOSPITAL_COMMUNITY): Admission: RE | Admit: 2019-05-06 | Payer: Medicare Other | Source: Ambulatory Visit

## 2019-05-08 ENCOUNTER — Other Ambulatory Visit: Payer: Self-pay | Admitting: Cardiovascular Disease

## 2019-05-18 ENCOUNTER — Other Ambulatory Visit: Payer: Self-pay | Admitting: Cardiovascular Disease

## 2019-05-31 ENCOUNTER — Inpatient Hospital Stay: Payer: Medicare Other

## 2019-05-31 ENCOUNTER — Other Ambulatory Visit: Payer: Self-pay

## 2019-05-31 ENCOUNTER — Inpatient Hospital Stay: Payer: Medicare Other | Attending: Oncology

## 2019-05-31 DIAGNOSIS — Z79899 Other long term (current) drug therapy: Secondary | ICD-10-CM | POA: Insufficient documentation

## 2019-05-31 DIAGNOSIS — Z7982 Long term (current) use of aspirin: Secondary | ICD-10-CM | POA: Diagnosis not present

## 2019-05-31 DIAGNOSIS — Z9079 Acquired absence of other genital organ(s): Secondary | ICD-10-CM | POA: Insufficient documentation

## 2019-05-31 DIAGNOSIS — C61 Malignant neoplasm of prostate: Secondary | ICD-10-CM

## 2019-05-31 DIAGNOSIS — Z9221 Personal history of antineoplastic chemotherapy: Secondary | ICD-10-CM | POA: Insufficient documentation

## 2019-05-31 DIAGNOSIS — Z79818 Long term (current) use of other agents affecting estrogen receptors and estrogen levels: Secondary | ICD-10-CM | POA: Insufficient documentation

## 2019-05-31 DIAGNOSIS — M79604 Pain in right leg: Secondary | ICD-10-CM | POA: Insufficient documentation

## 2019-05-31 DIAGNOSIS — C7951 Secondary malignant neoplasm of bone: Secondary | ICD-10-CM | POA: Diagnosis not present

## 2019-05-31 DIAGNOSIS — Z95828 Presence of other vascular implants and grafts: Secondary | ICD-10-CM | POA: Insufficient documentation

## 2019-05-31 DIAGNOSIS — I714 Abdominal aortic aneurysm, without rupture: Secondary | ICD-10-CM | POA: Insufficient documentation

## 2019-05-31 DIAGNOSIS — K8020 Calculus of gallbladder without cholecystitis without obstruction: Secondary | ICD-10-CM | POA: Insufficient documentation

## 2019-05-31 DIAGNOSIS — I700 Atherosclerosis of aorta: Secondary | ICD-10-CM | POA: Diagnosis not present

## 2019-05-31 DIAGNOSIS — Z452 Encounter for adjustment and management of vascular access device: Secondary | ICD-10-CM | POA: Insufficient documentation

## 2019-05-31 LAB — CBC WITH DIFFERENTIAL (CANCER CENTER ONLY)
Abs Immature Granulocytes: 0.02 10*3/uL (ref 0.00–0.07)
Basophils Absolute: 0 10*3/uL (ref 0.0–0.1)
Basophils Relative: 0 %
Eosinophils Absolute: 0.1 10*3/uL (ref 0.0–0.5)
Eosinophils Relative: 1 %
HCT: 35 % — ABNORMAL LOW (ref 39.0–52.0)
Hemoglobin: 11.8 g/dL — ABNORMAL LOW (ref 13.0–17.0)
Immature Granulocytes: 0 %
Lymphocytes Relative: 21 %
Lymphs Abs: 1.7 10*3/uL (ref 0.7–4.0)
MCH: 29.5 pg (ref 26.0–34.0)
MCHC: 33.7 g/dL (ref 30.0–36.0)
MCV: 87.5 fL (ref 80.0–100.0)
Monocytes Absolute: 0.7 10*3/uL (ref 0.1–1.0)
Monocytes Relative: 9 %
Neutro Abs: 5.5 10*3/uL (ref 1.7–7.7)
Neutrophils Relative %: 69 %
Platelet Count: 238 10*3/uL (ref 150–400)
RBC: 4 MIL/uL — ABNORMAL LOW (ref 4.22–5.81)
RDW: 16.3 % — ABNORMAL HIGH (ref 11.5–15.5)
WBC Count: 8 10*3/uL (ref 4.0–10.5)
nRBC: 0 % (ref 0.0–0.2)

## 2019-05-31 LAB — CMP (CANCER CENTER ONLY)
ALT: 11 U/L (ref 0–44)
AST: 15 U/L (ref 15–41)
Albumin: 3 g/dL — ABNORMAL LOW (ref 3.5–5.0)
Alkaline Phosphatase: 381 U/L — ABNORMAL HIGH (ref 38–126)
Anion gap: 10 (ref 5–15)
BUN: 16 mg/dL (ref 8–23)
CO2: 20 mmol/L — ABNORMAL LOW (ref 22–32)
Calcium: 9.1 mg/dL (ref 8.9–10.3)
Chloride: 109 mmol/L (ref 98–111)
Creatinine: 0.84 mg/dL (ref 0.61–1.24)
GFR, Est AFR Am: >60 mL/min (ref >60)
GFR, Estimated: >60 mL/min (ref >60)
Glucose, Bld: 119 mg/dL — ABNORMAL HIGH (ref 70–99)
Potassium: 4 mmol/L (ref 3.5–5.1)
Sodium: 139 mmol/L (ref 135–145)
Total Bilirubin: 0.4 mg/dL (ref 0.3–1.2)
Total Protein: 6.9 g/dL (ref 6.5–8.1)

## 2019-05-31 MED ORDER — HEPARIN SOD (PORK) LOCK FLUSH 100 UNIT/ML IV SOLN
500.0000 [IU] | Freq: Once | INTRAVENOUS | Status: AC | PRN
  Administered 2019-05-31: 10:00:00 500 [IU] via INTRAVENOUS
  Filled 2019-05-31: qty 5

## 2019-05-31 MED ORDER — SODIUM CHLORIDE 0.9% FLUSH
10.0000 mL | INTRAVENOUS | Status: DC | PRN
  Administered 2019-05-31: 10 mL via INTRAVENOUS
  Filled 2019-05-31: qty 10

## 2019-06-01 ENCOUNTER — Inpatient Hospital Stay (HOSPITAL_BASED_OUTPATIENT_CLINIC_OR_DEPARTMENT_OTHER): Payer: Medicare Other | Admitting: Oncology

## 2019-06-01 ENCOUNTER — Other Ambulatory Visit: Payer: Self-pay

## 2019-06-01 VITALS — BP 124/62 | HR 100 | Temp 98.5°F | Resp 18 | Ht 69.0 in | Wt 171.9 lb

## 2019-06-01 DIAGNOSIS — C61 Malignant neoplasm of prostate: Secondary | ICD-10-CM

## 2019-06-01 DIAGNOSIS — C7951 Secondary malignant neoplasm of bone: Secondary | ICD-10-CM | POA: Diagnosis not present

## 2019-06-01 DIAGNOSIS — I6529 Occlusion and stenosis of unspecified carotid artery: Secondary | ICD-10-CM | POA: Diagnosis not present

## 2019-06-01 DIAGNOSIS — Z452 Encounter for adjustment and management of vascular access device: Secondary | ICD-10-CM | POA: Diagnosis not present

## 2019-06-01 DIAGNOSIS — Z79818 Long term (current) use of other agents affecting estrogen receptors and estrogen levels: Secondary | ICD-10-CM | POA: Diagnosis not present

## 2019-06-01 DIAGNOSIS — Z9079 Acquired absence of other genital organ(s): Secondary | ICD-10-CM | POA: Diagnosis not present

## 2019-06-01 DIAGNOSIS — Z9221 Personal history of antineoplastic chemotherapy: Secondary | ICD-10-CM | POA: Diagnosis not present

## 2019-06-01 LAB — PROSTATE-SPECIFIC AG, SERUM (LABCORP): Prostate Specific Ag, Serum: 111 ng/mL — ABNORMAL HIGH (ref 0.0–4.0)

## 2019-06-01 MED ORDER — TRAMADOL HCL 50 MG PO TABS: 50.0000 mg | ORAL_TABLET | Freq: Four times a day (QID) | ORAL | 0 refills | Status: DC | PRN

## 2019-06-01 NOTE — Progress Notes (Signed)
Hematology and Oncology Follow Up Visit  Alec Harris [id number] 04-Oct-1945 73 y.o. 06/01/2019 9:56 AM Patient, No Pcp PerShadad, Alec Dad, MD   Principle Diagnosis: 64 year old man with castration-resistant prostate cancer with disease to the bone diagnosed in 2013.    Prior Therapy:  He underwent a prostatectomy with a pathology that showed prostatic adenocarcinoma. Gleason score 4+4 equals 8, with the tumor involving the margins and the pathologic staging was pT4 N1 with 1 of 2 left pelvic lymph nodes involved.   He was started on adjuvant hormone therapy with Lupron. He developed a rising PSA and bone metastasis.   He was then treated with ketoconazole and prednisone. He developed a rising PSA with a doubling time of just over 6 months. Bone scan in September 2013 showed increased uptake in the right lower lumbar spine at L4 and L5 and abnormal uptake in the mid sternum which had progressed since the prior bone scan.  Zytiga 1000 mg daily started in October 2013.  Discontinued in October 2018 after progression of disease.  Taxotere chemotherapy at 75 mg per meter square started on 06/02/2017.  The dose was reduced to 60 mg/m starting with cycle 3.  He completed 10 cycles of therapy and April 2019.   Xtandi 160 mg daily started in August 2019.  Therapy discontinued in December 2019 after progression of disease.  Jevtana chemotherapy started on August 04, 2018 and receiving 20 mg/m.  He completed 6 cycles of therapy in June 2020.   Current therapy:   Lupron 30 mg every 4 months.  Next injection of Eligard will be in January 2021.  Under consideration for different salvage therapy.    Interim History: Alec Harris is here for a follow-up visit.  Since the last visit, he reports no major changes in his health.  He is reporting increased right leg pain predominantly in the distal part close to his knee.  He is using a lot of Advil to control his pain sometimes up to 12 pills a day.   He denies any back pain or shoulder pain.  He denies any recent falls or syncope.  He does report inability to bear weight at times on that leg because of the pain.  He does ambulate with the help of a cane at times.  His appetite has decreased slightly but overall performance status and quality of life unchanged.   Patient denied headaches, blurry vision, syncope or seizures.  Denies any fevers, chills or sweats.  Denied chest pain, palpitation, orthopnea or leg edema.  Denied cough, wheezing or hemoptysis.  Denied nausea, vomiting or abdominal pain.  Denies any constipation or diarrhea.  Denies any frequency urgency or hesitancy.  Denies any arthralgias or myalgias.  Denies any skin rashes or lesions.  Denies any bleeding or clotting tendency.  Denies any easy bruising.  Denies any hair or nail changes.  Denies any anxiety or depression.  Remaining review of system is negative.              Medications: Without any changes on review. Current Outpatient Medications  Medication Sig Dispense Refill  . amLODipine (NORVASC) 5 MG tablet TAKE 1 TABLET (5 MG TOTAL) BY MOUTH DAILY. 90 tablet 2  . aspirin EC 81 MG tablet Take 81 mg by mouth daily.    [name] atorvastatin (LIPITOR) 20 MG tablet TAKE 1 TABLET BY MOUTH EVERY DAY 90 tablet 2  . Calcium Carb-Cholecalciferol (CALCIUM 500 +D) 500-400 MG-UNIT TABS Take 1 tablet by mouth daily.     [name]  cilostazol (PLETAL) 100 MG tablet TAKE 1 TABLET BY MOUTH TWICE A DAY 180 tablet 2  . lidocaine-prilocaine (EMLA) cream Apply 1 application topically as needed. 30 g 0  . megestrol (MEGACE) 40 MG/ML suspension TAKE 10 MLS (400 MG TOTAL) BY MOUTH DAILY. 240 mL 0  . prochlorperazine (COMPAZINE) 10 MG tablet Take 1 tablet (10 mg total) by mouth every 6 (six) hours as needed for nausea or vomiting. 30 tablet 0   No current facility-administered medications for this visit.    His past medical history, family history and social history reviewed today without any  changes.   Allergies: No Known Allergies   Physical Exam:   Blood pressure 124/62, pulse 100, temperature 98.5 F (36.9 C), temperature source Oral, resp. rate 18, height 5\' 9"  (1.753 m), weight 171 lb 14.4 oz (78 kg), SpO2 100 %.     ECOG: 1    General appearance: Alert, awake without any distress. Head: Atraumatic without abnormalities Oropharynx: Without any thrush or ulcers. Eyes: No scleral icterus. Lymph nodes: No lymphadenopathy noted in the cervical, supraclavicular, or axillary nodes Heart:regular rate and rhythm, without any murmurs or gallops.   Lung: Clear to auscultation without any rhonchi, wheezes or dullness to percussion. Abdomin: Soft, nontender without any shifting dullness or ascites. Musculoskeletal: No clubbing or cyanosis. Neurological: No motor or sensory deficits. Skin: No rashes or lesions. Psychiatric: Mood and affect appeared normal.             Lab Results: Lab Results  Component Value Date   WBC 8.0 05/31/2019   HGB 11.8 (L) 05/31/2019   HCT 35.0 (L) 05/31/2019   MCV 87.5 05/31/2019   PLT 238 05/31/2019     Chemistry      Component Value Date/Time   NA 139 05/31/2019 0955   NA 137 08/05/2017 0839   K 4.0 05/31/2019 0955   K 3.9 08/05/2017 0839   CL 109 05/31/2019 0955   CL 106 02/01/2013 1120   CO2 20 (L) 05/31/2019 0955   CO2 23 08/05/2017 0839   BUN 16 05/31/2019 0955   BUN 12.2 08/05/2017 0839   CREATININE 0.84 05/31/2019 0955   CREATININE 0.9 08/05/2017 0839      Component Value Date/Time   CALCIUM 9.1 05/31/2019 0955   CALCIUM 8.6 08/05/2017 0839   ALKPHOS 381 (H) 05/31/2019 0955   ALKPHOS 146 08/05/2017 0839   AST 15 05/31/2019 0955   AST 7 08/05/2017 0839   ALT 11 05/31/2019 0955   ALT <6 08/05/2017 0839   BILITOT 0.4 05/31/2019 0955   BILITOT 0.46 08/05/2017 0839          Results for [name], [name] (MRN [id number]) as of 06/01/2019 07:37  Ref. Range 04/14/2019 14:30 05/31/2019 09:55  Prostate  Specific Ag, Serum Latest Ref Range: 0.0 - 4.0 ng/mL 39.5 (H) 111.0 (H)   IMPRESSION: 1. Widespread patchy sclerotic osseous metastases throughout the visualized skeleton. 2. No lymphadenopathy or other findings of metastatic disease in the abdomen or pelvis. 3. Infrarenal 3.3 cm Abdominal Aortic Aneurysm (ICD10-I71.9). Recommend follow-up aortic ultrasound in 3 years. This recommendation follows ACR consensus guidelines: White Paper of the ACR Incidental Findings Committee II on Vascular Findings. J Am Coll Radiol 2013; 10:789-794. 4. Chronic findings include: Aortic Atherosclerosis (ICD10-I70.0). Cholelithiasis.   EXAM: NUCLEAR MEDICINE WHOLE BODY BONE SCAN  TECHNIQUE: Whole body anterior and posterior images were obtained approximately 3 hours after intravenous injection of radiopharmaceutical.  RADIOPHARMACEUTICALS:  20.2 mCi Technetium-59m MDP IV  COMPARISON:  CT 04/27/2019.  Bone scan 08/17/2018.  FINDINGS: Bilateral renal function excretion noted. Diffuse severe multifocal areas of increased activity are again noted. Increased radiopharmaceutical uptake is again noted over the right clavicle, sternum, multiple ribs bilaterally, thoracolumbar spine, pelvis, right humerus, left and right femur. Right upper posterior rib focus demonstrates increased activity from prior exam. Previously identified skull lesions have improved.  IMPRESSION: Diffuse severe multifocal areas of increased radiopharmaceutical uptake consistent metastatic disease again noted. A right upper posterior rib focus demonstrates increased activity from prior exam. Previously identified skull lesions appear to have improved.    Impression and Plan:   73 year old man with:  1.  Castration-resistant prostate cancer with disease to the bone noted since 2013.  He status post therapy outlined above and has recently progressed on systemic chemotherapy.  CT scan and bone scan obtained in September  2020 were reviewed today and discussed with the patient.  Continues to have disease in the bone only without any visceral metastasis.  Treatment options were discussed today which include to be treatment with systemic chemotherapy, Xofigo given his bone disease only, PARP inhibitor if he harbors specific mutation.  After discussion today, I will refer him for radiation oncology for evaluation for Xofigo.    2. Androgen deprivation.  His next Eligard will be scheduled for January 2021.  Long-term complications at this time includes hot flashes, weight gain and osteoporosis.  Is agreeable to continue.   3. Bone directed therapy.  I recommended continuing calcium and vitamin D supplements.  He has deferred Xgeva option at this time.  4. Anemia: Resolved at this time with hemoglobin improving off chemotherapy.  5. Bone pain: Has increased at this time predominantly in the right leg.  I will refer him for radiation evaluation for possible short course of palliative radiation therapy to the leg in addition to [place].  I will prescribe him Ultram for increased pain to decrease the use of Advil.  6. IV access: Port-A-Cath will be flushed periodically and remain in place for future use.  7.  Prognosis and goals of care: His disease is incurable and aggressive measures are warranted at this time.  His performance status remains adequate to do so.  8. Follow up: Will be in 6 weeks for repeat evaluation.  25 minutes was spent face-to-face with the patient today.  More than 50% was spent on reviewing imaging studies, treatment options, complication related therapy as well as future plan of care.     Zola Button, MD 10/14/20209:56 AM

## 2019-06-01 NOTE — Addendum Note (Signed)
Addended by: [name] on: 06/01/2019 10:23 AM   Modules accepted: Orders

## 2019-06-02 ENCOUNTER — Telehealth: Payer: Self-pay | Admitting: Oncology

## 2019-06-02 NOTE — Telephone Encounter (Signed)
Scheduled appt per 10/14 sch message.  Sent a staff message to get a calendar mailed out.

## 2019-06-08 ENCOUNTER — Encounter: Payer: Self-pay | Admitting: Radiation Oncology

## 2019-06-08 NOTE — Progress Notes (Signed)
Histology and Location of Primary Cancer: castration resistant prostate cancer  Sites of Visceral and Bony Metastatic Disease: widespread osseous mets  Location(s) of Symptomatic Metastases: increasing pain right distal leg closest to knee  Past/Anticipated chemotherapy by medical oncology, if any:  Prior Therapy:  He underwent a prostatectomy with a pathology that showed prostatic adenocarcinoma. Gleason score 4+4 equals 8, with the tumor involving the margins and the pathologic staging was pT4 N1 with 1 of 2 left pelvic lymph nodes involved.   He was started on adjuvant hormone therapy with Lupron. He developed a rising PSA and bone metastasis.   He was then treated with ketoconazole and prednisone. He developed a rising PSA with a doubling time of just over 6 months. Bone scan in September 2013 showed increased uptake in the right lower lumbar spine at L4 and L5 and abnormal uptake in the mid sternum which had progressed since the prior bone scan.  Zytiga 1000 mg daily started in October 2013.  Discontinued in October 2018 after progression of disease.  Taxotere chemotherapy at 75 mg per meter square started on 06/02/2017.  The dose was reduced to 60 mg/m starting with cycle 3.  He completed 10 cycles of therapy and April 2019.   Xtandi 160 mg daily started in August 2019.  Therapy discontinued in December 2019 after progression of disease.  Jevtana chemotherapy started on August 04, 2018 and receiving 20 mg/m.  He completed 6 cycles of therapy in June 2020.   Current therapy:   Lupron 30 mg every 4 months.  Next injection of Eligard will be in January 2021.  Under consideration for different salvage therapy.   Pain on a scale of 0-10 is: Denies back or shoulder pain. Reports increasing pain in right leg closest to knee. Taking up to 12 tablets of Advil per day to manage pain. Reports intermittent inability to ambulate due to pain.    If Spine Met(s), symptoms, if  any, include:  Bowel/Bladder retention or incontinence (please describe): Reports nocturia x 1-2. Denies dysuria or hematuria. Reports a steady stream without difficulty emptying his bladder. Denies urinary leakage or incontinence. Reports having a bowel movement yesterday but admits to struggling with constipation.   Numbness or weakness in extremities (please describe): Intermittent inability to bear weight on his right leg related to pain. Uses a cane to ambulate. States, "I never know when my leg is going to give out; I step and there is nothing there sometimes." Denies numbness in his legs are arms.   Current Decadron regimen, if applicable: denies  Ambulatory status? Walker? Wheelchair?: Ambulatory with Assistance of cane  SAFETY ISSUES:  Prior radiation? denies  Pacemaker/ICD? denies  Possible current pregnancy? no, male patient  Is the patient on methotrexate? denies  Current Complaints / other details:  73 year old male. Resides with wife and son.

## 2019-06-10 ENCOUNTER — Ambulatory Visit
Admission: RE | Admit: 2019-06-10 | Discharge: 2019-06-10 | Disposition: A | Discharge status: Home / Self Care | Payer: Medicare Other | Source: Ambulatory Visit | Attending: Radiation Oncology | Admitting: Radiation Oncology

## 2019-06-10 ENCOUNTER — Ambulatory Visit: Payer: Medicare Other

## 2019-06-10 ENCOUNTER — Other Ambulatory Visit: Payer: Self-pay

## 2019-06-10 ENCOUNTER — Encounter: Payer: Self-pay | Admitting: Radiation Oncology

## 2019-06-10 ENCOUNTER — Encounter: Payer: Self-pay | Admitting: Medical Oncology

## 2019-06-10 ENCOUNTER — Ambulatory Visit: Payer: Medicare Other | Admitting: Radiation Oncology

## 2019-06-10 VITALS — BP 142/75 | HR 82 | Temp 98.3°F | Resp 20 | Ht 69.0 in | Wt 171.9 lb

## 2019-06-10 DIAGNOSIS — C7952 Secondary malignant neoplasm of bone marrow: Secondary | ICD-10-CM | POA: Diagnosis not present

## 2019-06-10 DIAGNOSIS — C61 Malignant neoplasm of prostate: Secondary | ICD-10-CM

## 2019-06-10 DIAGNOSIS — C7951 Secondary malignant neoplasm of bone: Secondary | ICD-10-CM | POA: Insufficient documentation

## 2019-06-10 DIAGNOSIS — I10 Essential (primary) hypertension: Secondary | ICD-10-CM | POA: Diagnosis not present

## 2019-06-10 DIAGNOSIS — F17210 Nicotine dependence, cigarettes, uncomplicated: Secondary | ICD-10-CM | POA: Insufficient documentation

## 2019-06-10 DIAGNOSIS — Z803 Family history of malignant neoplasm of breast: Secondary | ICD-10-CM | POA: Insufficient documentation

## 2019-06-10 DIAGNOSIS — I739 Peripheral vascular disease, unspecified: Secondary | ICD-10-CM | POA: Diagnosis not present

## 2019-06-10 DIAGNOSIS — Z800 Family history of malignant neoplasm of digestive organs: Secondary | ICD-10-CM | POA: Insufficient documentation

## 2019-06-10 DIAGNOSIS — G893 Neoplasm related pain (acute) (chronic): Secondary | ICD-10-CM | POA: Diagnosis not present

## 2019-06-10 DIAGNOSIS — E785 Hyperlipidemia, unspecified: Secondary | ICD-10-CM | POA: Insufficient documentation

## 2019-06-10 DIAGNOSIS — Z79899 Other long term (current) drug therapy: Secondary | ICD-10-CM | POA: Diagnosis not present

## 2019-06-10 DIAGNOSIS — Z79818 Long term (current) use of other agents affecting estrogen receptors and estrogen levels: Secondary | ICD-10-CM | POA: Diagnosis not present

## 2019-06-10 DIAGNOSIS — Z192 Hormone resistant malignancy status: Secondary | ICD-10-CM | POA: Diagnosis not present

## 2019-06-10 DIAGNOSIS — Z7982 Long term (current) use of aspirin: Secondary | ICD-10-CM | POA: Diagnosis not present

## 2019-06-10 DIAGNOSIS — M79651 Pain in right thigh: Secondary | ICD-10-CM | POA: Insufficient documentation

## 2019-06-10 DIAGNOSIS — R9721 Rising PSA following treatment for malignant neoplasm of prostate: Secondary | ICD-10-CM | POA: Diagnosis not present

## 2019-06-10 NOTE — Progress Notes (Signed)
See progress note under physician encounter. 

## 2019-06-10 NOTE — Progress Notes (Signed)
Radiation Oncology         [phone]) [phone] ________________________________  Initial outpatient Consultation  Name: Mahin Dinsmoor MRN: [id number]  Date: 06/10/2019  DOB: 13-Mar-1946  ET:[id number], No Pcp Per  [name], MD   REFERRING PHYSICIAN: [name], MD  DIAGNOSIS: 73 y.o. gentleman with metastatic, castrate-resistant prostate cancer with diffuse bony disease and painful lesion in the right femur.    ICD-10-CM   1. Secondary malignant neoplasm of bone and bone marrow (HCC)  C79.51    C79.52   2. Malignant neoplasm of prostate metastatic to bone ([place])  C61    C79.51   3. Prostate cancer ([place])  C61     HISTORY OF PRESENT ILLNESS: Rami Chapp is a 73 y.o. male with a diagnosis of metastatic, castrate-resistant prostate cancer. He was initially diagnosed with prostate cancer with Dr. Lowella Bandy in 2003 and opted to undergo a radical retropubic prostatectomy with BPLND on 12/20/2001. Surgical pathology showed Gleason 4+4 disease with extracapsular extension to the right and left apex as well as the posterior lateral margins, bladder neck involvement, bilateral seminal vesicle involvement, and a positive lymph node (1/3). He was subsequently started on Lupron.  He unfortunately developed castration resistant disease with a rising PSA and was initially treated with prednisone and ketoconazole until 2013 when he developed progression with asymptomatic bone metastasis. He referred to Dr. [name] Blew and treated with Zytiga from 05/2012 to 05/2017.  This was discontinued due to disease progression and he was switched to Taxotere from 05/2017 to 11/2017 (completed 10 cycles) with PSA decreased to 2.2 in 11/2017.  He went on observation only for a short time but his PSA increased 6.3 in 03/2018 so he was started on Xtandi from 03/2018 to 07/2018.  Unfortunately his PSA continued to rise despite Xtandi so this was discontinued and he was switched to [place] in 07/2018 to 01/2019. He had a good response  to the Jevtana initially and completed 6 cycles on 01/2018.  He has been on a treatment break since that time and unfortunately, a follow-up PSA on 04/14/2019 was back up to 39.5.  Restaging CT A/P on 04/27/2019 showed widespread patchy sclerotic osseous metastases throughout the visualized skeleton but no lymphadenopathy or other findings of visceral metastatic disease.  His recent bone scan on 05/05/2019 again confirmed diffuse severe multifocal areas of increased uptake consistent with metastatic disease with increased activity within a right upper posterior rib but improved previously-identified skull lesions.  He has continued with complaints of severe pain in the right femur, progressive over the past 6 weeks without known injury.  He has not had any falls around the house and has been using a cane and/or rolling walker to assist him with his safe mobility.  He denies any erythema or swelling and reports that the pain is constant.  It is exacerbated with any weightbearing activities but is bothersome even when he is sitting or lying down.  He denies associated paresthesias or focal weakness in the lower extremities.  The pain occasionally wakes him from sleep at night.  He denies any back pain or bowel or bladder dysfunction.  He has been using Tylenol and Advil which are becoming less effective.  More recently, he has tried tramadol which has not been very effective in controlling his pain either.  He denies any other sites of focal, specific bone pain.  His PSA has also continued to climb, from 20 on 03/08/2019 to 39.5 on 04/14/19 and most recently, 111 on  05/31/2019.  The patient reviewed the imaging and PSA results with his oncologist and he has kindly been referred today for discussion of potential radiation treatment with Xofigo.   PREVIOUS RADIATION THERAPY: No  PAST MEDICAL HISTORY:  Past Medical History:  Diagnosis Date   Carotid artery disease ([place])    By doppler   Claudication ([place])  02/06/2012   ABI of 0.5 on both legs ,Rgt ext. iliac70 to 99%,rgt SFA occlude,lft ext.iliac common fem 70 to 99% ,Lft SFA occluded,mono flow popliteal   Hyperlipemia    Hypertension    PAD (peripheral artery disease) ([place]) 01/2012   severe diffuse his iliacs,SFA, carotids   Prostate cancer ([place])    Rectal bleeding 07/24/11   Diverticular Bleeding   Tobacco abuse       PAST SURGICAL HISTORY: Past Surgical History:  Procedure Laterality Date   CAROTID ANGIOGRAM  03/02/2012   bilateral carotid artery stenosis,95% right and 80% left with a type 2 arch   CAROTID ANGIOGRAM N/A 03/02/2012   Procedure: CAROTID ANGIOGRAM;  Surgeon: Lorretta Harp, MD;  Location: [facility] C[facility] LAB;  Service: Cardiovascular;  Laterality: N/A;   CAROTID ENDARTERECTOMY Right 04-08-12   CEA   CAROTID STENT INSERTION Left 07/05/2013   Procedure: CAROTID STENT INSERTION;  Surgeon: Serafina Mitchell, MD;  Location: [facility] CATH LAB;  Service: Cardiovascular;  Laterality: Left;   CERVICAL SPINE SURGERY  approx. 10 years ago   disc removed from neck   COLONOSCOPY  07/25/2011   Procedure: COLONOSCOPY;  Surgeon: Beryle Beams;  Location: [facility] ENDOSCOPY;  Service: Endoscopy;  Laterality: N/A;   DOPPLER ECHOCARDIOGRAPHY  03/24/2012   EF >70% LV normal   ENDARTERECTOMY  04/08/2012   Procedure: ENDARTERECTOMY CAROTID;  Surgeon: Serafina Mitchell, MD;  Location: [facility] OR;  Service: Vascular;  Laterality: Right;  Right carotid endarterectomy with vascuguard 1cm x 6cm bovine patch angioplasty.    IR FLUORO GUIDE PORT INSERTION RIGHT  06/05/2017   IR US GUIDE VASC ACCESS RIGHT  06/05/2017   KNEE CARTILAGE SURGERY     surgery for a torn ligament   Lexiscan Myoview  01/14/2012   small fixed basal to mid inferior bowel attenuation artifact. no reversible ischemia   LOWER EXTREMITY ANGIOGRAM N/A 03/02/2012   Procedure: LOWER EXTREMITY ANGIOGRAM;  Surgeon: Lorretta Harp, MD;  Location: [facility] CATH LAB;  Service: Cardiovascular;   Laterality: N/A;   [place] SURGERY  2003   for  prostate cancer   PV angiogram  03/02/2012   total SFAs bilaterally with 3- vessel runoff below the knee, high-grade calcified iliac diseaese    FAMILY HISTORY:  Family History  Problem Relation Age of Onset   Hypertension Mother    Heart attack Mother    Heart disease Mother        CHF/  After age 108   Heart attack Father    Heart disease Father        Irregular Heart beat, Heart Disease before age 47   Cancer Brother        throat   Breast cancer Neg Hx    Colon cancer Neg Hx    Prostate cancer Neg Hx     SOCIAL HISTORY:  Social History   Socioeconomic History   Marital status: Married    Spouse name: Not on file   Number of children: Not on file   Years of education: Not on file   Highest education level: Not on  file  Occupational History   Not on file  Social Needs   Financial resource strain: Not on file   Food insecurity    Worry: Not on file    Inability: Not on file   Transportation needs    Medical: Not on file    Non-medical: Not on file  Tobacco Use   Smoking status: Light Tobacco Smoker    Years: 50.00    Types: Cigarettes   Smokeless tobacco: Never Used   Tobacco comment: pt states that he is going to quit completely; 1/2 pack per day  Substance and Sexual Activity   Alcohol use: Yes    Alcohol/week: 5.0 standard drinks    Types: 3 Cans of beer, 2 Shots of liquor per week   Drug use: No   Sexual activity: Not Currently    Birth control/protection: None  Lifestyle   Physical activity    Days per week: Not on file    Minutes per session: Not on file   Stress: Not on file  Relationships   Social connections    Talks on phone: Not on file    Gets together: Not on file    Attends religious service: Not on file    Active member of club or organization: Not on file    Attends meetings of clubs or organizations: Not on file     Relationship status: Not on file   Intimate partner violence    Fear of current or ex partner: Not on file    Emotionally abused: Not on file    Physically abused: Not on file    Forced sexual activity: Not on file  Other Topics Concern   Not on file  Social History Narrative   Not on file    ALLERGIES: Patient has no known allergies.  MEDICATIONS:  Current Outpatient Medications  Medication Sig Dispense Refill   amLODipine (NORVASC) 5 MG tablet TAKE 1 TABLET (5 MG TOTAL) BY MOUTH DAILY. 90 tablet 2   aspirin EC 81 MG tablet Take 81 mg by mouth daily.     atorvastatin (LIPITOR) 20 MG tablet TAKE 1 TABLET BY MOUTH EVERY DAY 90 tablet 2   Calcium Carb-Cholecalciferol (CALCIUM 500 +D) 500-400 MG-UNIT TABS Take 1 tablet by mouth daily.      cilostazol (PLETAL) 100 MG tablet TAKE 1 TABLET BY MOUTH TWICE A DAY 180 tablet 2   lidocaine-prilocaine (EMLA) cream Apply 1 application topically as needed. 30 g 0   megestrol (MEGACE) 40 MG/ML suspension TAKE 10 MLS (400 MG TOTAL) BY MOUTH DAILY. 240 mL 0   traMADol (ULTRAM) 50 MG tablet Take 1 tablet (50 mg total) by mouth every 6 (six) hours as needed. 30 tablet 0   prochlorperazine (COMPAZINE) 10 MG tablet Take 1 tablet (10 mg total) by mouth every 6 (six) hours as needed for nausea or vomiting. (Patient not taking: Reported on 06/10/2019) 30 tablet 0   No current facility-administered medications for this encounter.     REVIEW OF SYSTEMS:  On review of systems, the patient reports that he is doing well overall. He denies any chest pain, shortness of breath, cough, fevers, chills, night sweats, unintended weight changes. He denies any bowel disturbances, and denies abdominal pain, nausea or vomiting. He reports increasing pain to his right femur, closest to the knee, and denies back or shoulder pain. He states it has been bothersome for the last 6 weeks and can interrupt his sleep. He notes feeling it most when he  is getting up from  sitting or laying down. He reports intermittent inability to bear weight on his right leg because of the pain. He also reports nocturia x1-2, a steady stream without difficulty emptying his bladder, and some constipation. He denies dysuria or hematuria, urinary leakage or incontinence. A complete review of systems is obtained and is otherwise negative.    PHYSICAL EXAM:  Wt Readings from Last 3 Encounters:  06/10/19 171 lb 14.4 oz (78 kg)  06/01/19 171 lb 14.4 oz (78 kg)  04/19/19 175 lb 1.6 oz (79.4 kg)   Temp Readings from Last 3 Encounters:  06/10/19 98.3 F (36.8 C) (Oral)  06/01/19 98.5 F (36.9 C) (Oral)  04/19/19 98.3 F (36.8 C) (Oral)   BP Readings from Last 3 Encounters:  06/10/19 (!) 142/75  06/01/19 124/62  04/19/19 131/64   Pulse Readings from Last 3 Encounters:  06/10/19 82  06/01/19 100  04/19/19 86   Pain Assessment Pain Score: 3  Pain Frequency: Constant Pain Loc: Leg/10  In general this is a well appearing African-American male in no acute distress. He's alert and oriented x4 and appropriate throughout the examination. Cardiopulmonary assessment is negative for acute distress and he exhibits normal effort.    KPS = 70  100 - Normal; no complaints; no evidence of disease. 90   - Able to carry on normal activity; minor signs or symptoms of disease. 80   - Normal activity with effort; some signs or symptoms of disease. 69   - Cares for self; unable to carry on normal activity or to do active work. 60   - Requires occasional assistance, but is able to care for most of his personal needs. 50   - Requires considerable assistance and frequent medical care. 4   - Disabled; requires special care and assistance. 62   - Severely disabled; hospital admission is indicated although death not imminent. 43   - Very sick; hospital admission necessary; active supportive treatment necessary. 10   - Moribund; fatal processes progressing rapidly. 0     - Dead  Karnofsky  DA, Abelmann Vaughn, Craver LS and [facility] [phone]) The use of the nitrogen mustards in the palliative treatment of carcinoma: with particular reference to bronchogenic carcinoma Cancer 1 634-56  LABORATORY DATA:  Lab Results  Component Value Date   WBC 8.0 05/31/2019   HGB 11.8 (L) 05/31/2019   HCT 35.0 (L) 05/31/2019   MCV 87.5 05/31/2019   PLT 238 05/31/2019   Lab Results  Component Value Date   NA 139 05/31/2019   K 4.0 05/31/2019   CL 109 05/31/2019   CO2 20 (L) 05/31/2019   Lab Results  Component Value Date   ALT 11 05/31/2019   AST 15 05/31/2019   ALKPHOS 381 (H) 05/31/2019   BILITOT 0.4 05/31/2019     RADIOGRAPHY: No results found.    IMPRESSION/PLAN: 1. 73 y.o. gentleman with metastatic, castrate-resistant prostate cancer with diffuse bony disease and painful lesion in the right femur.  Today, we talked to the patient about the findings and workup thus far. We discussed the natural history of metastatic prostate cancer and general treatment, highlighting the role of Xogifo infusions and palliative external beam radiation in the management. We focused on the details of logistics and delivery. The recommendation is to proceed with monthly infusions of Xofigo x6 for management of the diffuse bony metastases. We will monitor labs prior to each infusion to ensure it is safe to proceed with each  treatment. We reviewed the anticipated acute and late sequelae associated with Xofigo in this setting. The patient was encouraged to ask questions that were answered to his satisfaction. We also discussed the roll of external beam radiation to "spot treat" certain areas that are especially painful as needed.  At the end of our conversation, the patient elects to proceed with Xofigo infusions. He is also most interested in proceeding with a 2 week course of daily palliative EBRT to the painful bony lesion in the right femur. We will share this information with Dr. [name] Blew and move forward  with treatment planning accordingly.  He will undergo CT simulation/treatment planning following our visit today in anticipation of beginning his palliative external beam radiation to the right femur in the near future.  Xofigo infusions will be coordinated through Romie Jumper in our office and anticipate this treatment to begin in the near future as well.  He appears to have a good understanding of his disease and our recommendations which are of palliative intent and he is comfortable and in agreement with the stated plan.  He knows to call with any questions or concerns in the interim.    Nicholos Johns, PA-C    Tyler Pita, MD  [facility] Direct Dial: [phone]   Fax: [phone] [email or web address]   Skype   [email or web address]  This document serves as a record of services personally performed by Tyler Pita, MD and Freeman Caldron, PA-C. It was created on their behalf by Wilburn Mylar, a trained medical scribe. The creation of this record is based on the scribe's personal observations and the provider's statements to them. This document has been checked and approved by the attending provider.

## 2019-06-13 NOTE — Progress Notes (Signed)
  Radiation Oncology         [phone] ________________________________  Name: Alec Harris MRN: [id number]  Date: 06/10/2019  DOB: 06-09-1946   To whom it may concern:   Alec Harris is a patient with metastatic, castrate resistant prostate cancer with predominantly bone disease.  For details of his clinical history, please see the previously detailed consultation report that can be supplied with this letter.  I have recommended Radium 223 Dichloride treatment for monthly IV infusion over six months.  Based upon the randomized phase III Alsympca trial, we would expect an improvement in the patient's overall survival, improved pain control, decreased narcotic requirements, and decreased probability of skeletal related adverse events moving forward.  Given the data and this patient's situation, I feel that the delivery of Alec Harris is medical necessity.  Please call if any further information is required to aid in this patient's treatment.    Sincerely yours,  Sheral Apley. Tammi Klippel, M.D.   -----------------------------------------------------------------------------------------------------------  Ref:  Zack Seal, Diannia Ruder, et al., Alpha emitter radium-223 and survival in metastatic prostate cancer. [facility] Med. 2013 Jul 18;369(3):213-23

## 2019-06-13 NOTE — Progress Notes (Signed)
  Radiation Oncology         [phone] ________________________________  Name: Alec Harris MRN: [id number]  Date: 06/10/2019  DOB: 08/20/1945  Xofigo Treatment Planning Note:  Diagnosis:  Castration resistant prostate cancer with painful bone involvement  Narrative: Mr.Alec Harris is a patient who has been diagnosed with castration resistant prostate cancer with painful bone involvement.  His most recent blood counts show that he remains a good candidate to proceed with Ra-223.  The patient is going to receive Xofigo for his treatment.   Radiation Treatment Planning:  The prescribed radiation activity will be 50 kBq per kg per infusions. The plan is to offer a total of 6 IV administrations of this agent, assuming the blood counts are adequate prior to each administration, with each infusion done at 4 week intervals.  This will be done as an IV administration in the nuclear medicine department, with care to undertake all radiation protection precautions as recommended.   ________________________________  Sheral Apley. Tammi Klippel, M.D.

## 2019-06-14 NOTE — Progress Notes (Signed)
  Radiation Oncology         [phone] ________________________________  Name: Alec Harris MRN: [id number]  Date: 06/10/2019  DOB: 05-04-1946  SIMULATION AND TREATMENT PLANNING NOTE    ICD-10-CM   1. Malignant neoplasm of prostate metastatic to bone ([place])  C61    C79.51     DIAGNOSIS:  73 y.o. gentleman with metastatic, castrate-resistant prostate cancer with diffuse bony disease and painful lesion in the right femur.  NARRATIVE:  The patient was brought to the [facility].  Identity was confirmed.  All relevant records and images related to the planned course of therapy were reviewed.  The patient freely provided informed written consent to proceed with treatment after reviewing the details related to the planned course of therapy. The consent form was witnessed and verified by the simulation staff.  Then, the patient was set-up in a stable reproducible  supine position for radiation therapy.  CT images were obtained.  Surface markings were placed.  The CT images were loaded into the planning software.  Then the target and avoidance structures were contoured.  Treatment planning then occurred.  The radiation prescription was entered and confirmed.  Then, I designed and supervised the construction of a total of at least 3 medically necessary complex treatment devices including vacloc leg positioner, and 2 MLCs to shield critical structures.  I have requested : Isodose Plan.  PLAN:  The patient will receive 30 Gy in 10 fraction.  ________________________________  Sheral Apley Tammi Klippel, M.D.

## 2019-06-15 ENCOUNTER — Encounter (HOSPITAL_COMMUNITY): Payer: Medicare Other

## 2019-06-15 DIAGNOSIS — C7952 Secondary malignant neoplasm of bone marrow: Secondary | ICD-10-CM | POA: Diagnosis not present

## 2019-06-15 DIAGNOSIS — C61 Malignant neoplasm of prostate: Secondary | ICD-10-CM | POA: Diagnosis not present

## 2019-06-15 DIAGNOSIS — C7951 Secondary malignant neoplasm of bone: Secondary | ICD-10-CM | POA: Diagnosis not present

## 2019-06-16 ENCOUNTER — Ambulatory Visit
Admission: RE | Admit: 2019-06-16 | Discharge: 2019-06-16 | Disposition: A | Discharge status: Home / Self Care | Payer: Medicare Other | Source: Ambulatory Visit | Attending: Radiation Oncology | Admitting: Radiation Oncology

## 2019-06-16 DIAGNOSIS — C7952 Secondary malignant neoplasm of bone marrow: Secondary | ICD-10-CM | POA: Diagnosis not present

## 2019-06-16 DIAGNOSIS — C7951 Secondary malignant neoplasm of bone: Secondary | ICD-10-CM | POA: Diagnosis not present

## 2019-06-16 DIAGNOSIS — C61 Malignant neoplasm of prostate: Secondary | ICD-10-CM | POA: Diagnosis not present

## 2019-06-17 ENCOUNTER — Ambulatory Visit
Admission: RE | Admit: 2019-06-17 | Discharge: 2019-06-17 | Disposition: A | Discharge status: Home / Self Care | Payer: Medicare Other | Source: Ambulatory Visit | Attending: Radiation Oncology | Admitting: Radiation Oncology

## 2019-06-17 ENCOUNTER — Other Ambulatory Visit: Payer: Self-pay

## 2019-06-17 DIAGNOSIS — C7951 Secondary malignant neoplasm of bone: Secondary | ICD-10-CM | POA: Diagnosis not present

## 2019-06-17 DIAGNOSIS — C7952 Secondary malignant neoplasm of bone marrow: Secondary | ICD-10-CM | POA: Diagnosis not present

## 2019-06-17 DIAGNOSIS — C61 Malignant neoplasm of prostate: Secondary | ICD-10-CM | POA: Diagnosis not present

## 2019-06-20 ENCOUNTER — Other Ambulatory Visit: Payer: Self-pay

## 2019-06-20 ENCOUNTER — Ambulatory Visit
Admission: RE | Admit: 2019-06-20 | Discharge: 2019-06-20 | Disposition: A | Discharge status: Home / Self Care | Payer: Medicare Other | Source: Ambulatory Visit | Attending: Radiation Oncology | Admitting: Radiation Oncology

## 2019-06-20 DIAGNOSIS — C61 Malignant neoplasm of prostate: Secondary | ICD-10-CM | POA: Insufficient documentation

## 2019-06-20 DIAGNOSIS — C7951 Secondary malignant neoplasm of bone: Secondary | ICD-10-CM | POA: Insufficient documentation

## 2019-06-20 DIAGNOSIS — C7952 Secondary malignant neoplasm of bone marrow: Secondary | ICD-10-CM | POA: Insufficient documentation

## 2019-06-21 ENCOUNTER — Ambulatory Visit
Admission: RE | Admit: 2019-06-21 | Discharge: 2019-06-21 | Disposition: A | Discharge status: Home / Self Care | Payer: Medicare Other | Source: Ambulatory Visit | Attending: Radiation Oncology | Admitting: Radiation Oncology

## 2019-06-21 ENCOUNTER — Other Ambulatory Visit: Payer: Self-pay

## 2019-06-21 DIAGNOSIS — C7951 Secondary malignant neoplasm of bone: Secondary | ICD-10-CM | POA: Diagnosis not present

## 2019-06-21 DIAGNOSIS — C7952 Secondary malignant neoplasm of bone marrow: Secondary | ICD-10-CM | POA: Diagnosis not present

## 2019-06-21 DIAGNOSIS — C61 Malignant neoplasm of prostate: Secondary | ICD-10-CM | POA: Diagnosis not present

## 2019-06-22 ENCOUNTER — Ambulatory Visit
Admission: RE | Admit: 2019-06-22 | Discharge: 2019-06-22 | Disposition: A | Discharge status: Home / Self Care | Payer: Medicare Other | Source: Ambulatory Visit | Attending: Radiation Oncology | Admitting: Radiation Oncology

## 2019-06-22 ENCOUNTER — Other Ambulatory Visit: Payer: Self-pay

## 2019-06-22 DIAGNOSIS — C7951 Secondary malignant neoplasm of bone: Secondary | ICD-10-CM | POA: Diagnosis not present

## 2019-06-22 DIAGNOSIS — C61 Malignant neoplasm of prostate: Secondary | ICD-10-CM | POA: Diagnosis not present

## 2019-06-22 DIAGNOSIS — C7952 Secondary malignant neoplasm of bone marrow: Secondary | ICD-10-CM | POA: Diagnosis not present

## 2019-06-23 ENCOUNTER — Ambulatory Visit
Admission: RE | Admit: 2019-06-23 | Discharge: 2019-06-23 | Disposition: A | Discharge status: Home / Self Care | Payer: Medicare Other | Source: Ambulatory Visit | Attending: Radiation Oncology | Admitting: Radiation Oncology

## 2019-06-23 DIAGNOSIS — C7952 Secondary malignant neoplasm of bone marrow: Secondary | ICD-10-CM | POA: Diagnosis not present

## 2019-06-23 DIAGNOSIS — C7951 Secondary malignant neoplasm of bone: Secondary | ICD-10-CM | POA: Diagnosis not present

## 2019-06-23 DIAGNOSIS — C61 Malignant neoplasm of prostate: Secondary | ICD-10-CM | POA: Diagnosis not present

## 2019-06-24 ENCOUNTER — Other Ambulatory Visit: Payer: Self-pay

## 2019-06-24 ENCOUNTER — Ambulatory Visit
Admission: RE | Admit: 2019-06-24 | Discharge: 2019-06-24 | Disposition: A | Discharge status: Home / Self Care | Payer: Medicare Other | Source: Ambulatory Visit | Attending: Radiation Oncology | Admitting: Radiation Oncology

## 2019-06-24 DIAGNOSIS — C7951 Secondary malignant neoplasm of bone: Secondary | ICD-10-CM | POA: Diagnosis not present

## 2019-06-24 DIAGNOSIS — C61 Malignant neoplasm of prostate: Secondary | ICD-10-CM | POA: Diagnosis not present

## 2019-06-24 DIAGNOSIS — C7952 Secondary malignant neoplasm of bone marrow: Secondary | ICD-10-CM | POA: Diagnosis not present

## 2019-06-27 ENCOUNTER — Ambulatory Visit
Admission: RE | Admit: 2019-06-27 | Discharge: 2019-06-27 | Disposition: A | Discharge status: Home / Self Care | Payer: Medicare Other | Source: Ambulatory Visit | Attending: Radiation Oncology | Admitting: Radiation Oncology

## 2019-06-27 ENCOUNTER — Other Ambulatory Visit: Payer: Self-pay

## 2019-06-27 ENCOUNTER — Other Ambulatory Visit: Payer: Self-pay | Admitting: Oncology

## 2019-06-27 ENCOUNTER — Telehealth: Payer: Self-pay

## 2019-06-27 DIAGNOSIS — C61 Malignant neoplasm of prostate: Secondary | ICD-10-CM | POA: Diagnosis not present

## 2019-06-27 DIAGNOSIS — C7952 Secondary malignant neoplasm of bone marrow: Secondary | ICD-10-CM | POA: Diagnosis not present

## 2019-06-27 DIAGNOSIS — C7951 Secondary malignant neoplasm of bone: Secondary | ICD-10-CM | POA: Diagnosis not present

## 2019-06-27 MED ORDER — HYDROCODONE-ACETAMINOPHEN 5-325 MG PO TABS: 1.0000 | ORAL_TABLET | Freq: Four times a day (QID) | ORAL | 0 refills | Status: DC | PRN

## 2019-06-27 NOTE — Telephone Encounter (Signed)
-----   Message from [name], MD sent at 06/27/2019 10:52 AM EST ----- Regarding: RE: Patient question I will send hydrocodone. Thanks ----- Message ----- From: Teodoro Spray, RN Sent: 06/27/2019  10:47 AM EST To: [name], MD Subject: Patient question                               Patient called office requesting something stronger than tramadol for pain management. Patient states that he has still been taking Advil twice daily along with the tramadol to help alleviate the pain. Per the last office visit note on 06/01/19, tramadol was added to decrease use of Advil.   Please advise.

## 2019-06-27 NOTE — Telephone Encounter (Signed)
Please see message below.  Informed patient of new pain medication prescription. Patient verbalized understanding. Patient instructed to call office with any questions or concerns.

## 2019-06-28 ENCOUNTER — Ambulatory Visit
Admission: RE | Admit: 2019-06-28 | Discharge: 2019-06-28 | Disposition: A | Discharge status: Home / Self Care | Payer: Medicare Other | Source: Ambulatory Visit | Attending: Radiation Oncology | Admitting: Radiation Oncology

## 2019-06-28 ENCOUNTER — Other Ambulatory Visit: Payer: Self-pay

## 2019-06-28 DIAGNOSIS — C7951 Secondary malignant neoplasm of bone: Secondary | ICD-10-CM | POA: Diagnosis not present

## 2019-06-28 DIAGNOSIS — C7952 Secondary malignant neoplasm of bone marrow: Secondary | ICD-10-CM | POA: Diagnosis not present

## 2019-06-28 DIAGNOSIS — C61 Malignant neoplasm of prostate: Secondary | ICD-10-CM | POA: Diagnosis not present

## 2019-06-29 ENCOUNTER — Other Ambulatory Visit: Payer: Self-pay

## 2019-06-29 ENCOUNTER — Ambulatory Visit
Admission: RE | Admit: 2019-06-29 | Discharge: 2019-06-29 | Disposition: A | Discharge status: Home / Self Care | Payer: Medicare Other | Source: Ambulatory Visit | Attending: Radiation Oncology | Admitting: Radiation Oncology

## 2019-06-29 ENCOUNTER — Encounter: Payer: Self-pay | Admitting: Radiation Oncology

## 2019-06-29 DIAGNOSIS — C7952 Secondary malignant neoplasm of bone marrow: Secondary | ICD-10-CM | POA: Diagnosis not present

## 2019-06-29 DIAGNOSIS — C61 Malignant neoplasm of prostate: Secondary | ICD-10-CM | POA: Diagnosis not present

## 2019-06-29 DIAGNOSIS — C7951 Secondary malignant neoplasm of bone: Secondary | ICD-10-CM | POA: Diagnosis not present

## 2019-07-09 ENCOUNTER — Other Ambulatory Visit: Payer: Self-pay

## 2019-07-09 ENCOUNTER — Encounter (HOSPITAL_COMMUNITY): Payer: Self-pay | Admitting: *Deleted

## 2019-07-09 ENCOUNTER — Ambulatory Visit (HOSPITAL_COMMUNITY)
Admission: EM | Admit: 2019-07-09 | Discharge: 2019-07-09 | Disposition: A | Discharge status: Home / Self Care | Payer: Medicare Other | Attending: Family Medicine | Admitting: Family Medicine

## 2019-07-09 DIAGNOSIS — R109 Unspecified abdominal pain: Secondary | ICD-10-CM | POA: Insufficient documentation

## 2019-07-09 LAB — POCT URINALYSIS DIP (DEVICE)
Bilirubin Urine: NEGATIVE
Glucose, UA: NEGATIVE mg/dL
Hgb urine dipstick: NEGATIVE
Ketones, ur: NEGATIVE mg/dL
Leukocytes,Ua: NEGATIVE
Nitrite: NEGATIVE
Protein, ur: NEGATIVE mg/dL
Specific Gravity, Urine: 1.02 (ref 1.005–1.030)
Urobilinogen, UA: 0.2 mg/dL (ref 0.0–1.0)
pH: 6.5 (ref 5.0–8.0)

## 2019-07-09 LAB — COMPREHENSIVE METABOLIC PANEL
ALT: 25 U/L (ref 0–44)
AST: 17 U/L (ref 15–41)
Albumin: 3 g/dL — ABNORMAL LOW (ref 3.5–5.0)
Alkaline Phosphatase: 509 U/L — ABNORMAL HIGH (ref 38–126)
Anion gap: 5 (ref 5–15)
BUN: 18 mg/dL (ref 8–23)
CO2: 24 mmol/L (ref 22–32)
Calcium: 9.3 mg/dL (ref 8.9–10.3)
Chloride: 107 mmol/L (ref 98–111)
Creatinine, Ser: 1.15 mg/dL (ref 0.61–1.24)
GFR calc Af Amer: >60 mL/min (ref >60)
GFR calc non Af Amer: >60 mL/min (ref >60)
Glucose, Bld: 113 mg/dL — ABNORMAL HIGH (ref 70–99)
Potassium: 4.3 mmol/L (ref 3.5–5.1)
Sodium: 136 mmol/L (ref 135–145)
Total Bilirubin: 0.5 mg/dL (ref 0.3–1.2)
Total Protein: 6.9 g/dL (ref 6.5–8.1)

## 2019-07-09 LAB — CBC
HCT: 38.2 % — ABNORMAL LOW (ref 39.0–52.0)
Hemoglobin: 12.5 g/dL — ABNORMAL LOW (ref 13.0–17.0)
MCH: 29.1 pg (ref 26.0–34.0)
MCHC: 32.7 g/dL (ref 30.0–36.0)
MCV: 89 fL (ref 80.0–100.0)
Platelets: 289 10*3/uL (ref 150–400)
RBC: 4.29 MIL/uL (ref 4.22–5.81)
RDW: 18.3 % — ABNORMAL HIGH (ref 11.5–15.5)
WBC: 6.2 10*3/uL (ref 4.0–10.5)
nRBC: 0 % (ref 0.0–0.2)

## 2019-07-09 MED ORDER — HYDROCODONE-ACETAMINOPHEN 5-325 MG PO TABS: 1.0000 | ORAL_TABLET | Freq: Three times a day (TID) | ORAL | 0 refills | Status: DC | PRN

## 2019-07-09 NOTE — ED Provider Notes (Signed)
[place]    CSN: [id number] Arrival date & time: 07/09/19  1020      History   Chief Complaint Chief Complaint  Patient presents with  . Flank Pain    HPI Alec Harris is a 73 y.o. male.   He is presenting with 3-day history of right flank pain.  It is intermittent in nature.  It was worse at night where he is unable to sleep.  Denies a history of kidney stones.  He does have a history of kidney infection.  Denies any blood per urine.  Has been taking Advil for the pain.  Denies any trauma.  He does have history of prostate cancer.  No abdominal surgery.  He has had his prostate removed.  No new or different medications.  Pain seems to be localized to this area.  HPI  Past Medical History:  Diagnosis Date  . Carotid artery disease ([place])    By doppler  . Claudication ([place]) 02/06/2012   ABI of 0.5 on both legs ,Rgt ext. iliac70 to 99%,rgt SFA occlude,lft ext.iliac common fem 70 to 99% ,Lft SFA occluded,mono flow popliteal  . Hyperlipemia   . Hypertension   . PAD (peripheral artery disease) ([place]) 01/2012   severe diffuse his iliacs,SFA, carotids  . Prostate cancer ([place])   . Rectal bleeding 07/24/11   Diverticular Bleeding  . Tobacco abuse     Patient Active Problem List   Diagnosis Date Noted  . Malignant neoplasm of prostate metastatic to bone ([place]) 06/10/2019  . Encounter for antineoplastic chemotherapy 09/16/2017  . Port-A-Cath in place 07/15/2017  . Goals of care, counseling/discussion 05/21/2017  . Aftercare following surgery of the circulatory system, [place] 08/15/2013  . PVD (peripheral vascular disease) with claudication ([place]) 12/29/2012  . Carotid disease, bilateral ([place]) 12/29/2012  . Hyperlipemia 12/29/2012  . Prostate cancer ([place]) 07/07/2012  . Occlusion and stenosis of carotid artery without mention of cerebral infarction 03/29/2012    Past Surgical History:  Procedure Laterality Date  . CAROTID ANGIOGRAM  03/02/2012   bilateral carotid artery  stenosis,95% right and 80% left with a type 2 arch  . CAROTID ANGIOGRAM N/A 03/02/2012   Procedure: CAROTID ANGIOGRAM;  Surgeon: Lorretta Harp, MD;  Location[facility] CATH LAB;  Service: Cardiovascular;  Laterality: N/A;  . CAROTID ENDARTERECTOMY Right 04-08-12   CEA  . CAROTID STENT INSERTION Left 07/05/2013   Procedure: CAROTID STENT INSERTION;  Surgeon: Serafina Mitchell, MD;  Location: [facility] CATH LAB;  Service: Cardiovascular;  Laterality: Left;  . CERVICAL SPINE SURGERY  approx. 10 years ago   disc removed from neck  . COLONOSCOPY  07/25/2011   Procedure: COLONOSCOPY;  Surgeon: Beryle Beams;  Location: [facility] ENDOSCOPY;  Service: Endoscopy;  Laterality: N/A;  . DOPPLER ECHOCARDIOGRAPHY  03/24/2012   EF >70% LV normal  . ENDARTERECTOMY  04/08/2012   Procedure: ENDARTERECTOMY CAROTID;  Surgeon: Serafina Mitchell, MD;  Location: [place] OR;  Service: Vascular;  Laterality: Right;  Right carotid endarterectomy with vascuguard 1cm x 6cm bovine patch angioplasty.   . IR FLUORO GUIDE PORT INSERTION RIGHT  06/05/2017  . IR US GUIDE VASC ACCESS RIGHT  06/05/2017  . KNEE CARTILAGE SURGERY     surgery for a torn ligament  . Lexiscan Myoview  01/14/2012   small fixed basal to mid inferior bowel attenuation artifact. no reversible ischemia  . LOWER EXTREMITY ANGIOGRAM N/A 03/02/2012   Procedure: LOWER EXTREMITY ANGIOGRAM;  Surgeon: Lorretta Harp, MD;  Location: [facility] CATH  LAB;  Service: Cardiovascular;  Laterality: N/A;  . PENILE PROSTHESIS IMPLANT    . PROSTATE SURGERY  2003   for  prostate cancer  . PV angiogram  03/02/2012   total SFAs bilaterally with 3- vessel runoff below the knee, high-grade calcified iliac diseaese       Home Medications    Prior to Admission medications   Medication Sig Start Date End Date Taking? Authorizing Provider  amLODipine (NORVASC) 5 MG tablet TAKE 1 TABLET (5 MG TOTAL) BY MOUTH DAILY. 04/28/19  Yes Lorretta Harp, MD  aspirin EC 81 MG tablet Take 81 mg by mouth daily.    Yes [provider]  atorvastatin (LIPITOR) 20 MG tablet TAKE 1 TABLET BY MOUTH EVERY DAY 05/18/19  Yes Lorretta Harp, MD  Calcium Carb-Cholecalciferol (CALCIUM 500 +D) 500-400 MG-UNIT TABS Take 1 tablet by mouth daily.    Yes [provider]  cilostazol (PLETAL) 100 MG tablet TAKE 1 TABLET BY MOUTH TWICE A DAY 05/09/19  Yes Lorretta Harp, MD  megestrol (MEGACE) 40 MG/ML suspension TAKE 10 MLS (400 MG TOTAL) BY MOUTH DAILY. 12/27/18  Yes [name], MD  HYDROcodone-acetaminophen (NORCO/VICODIN) 5-325 MG tablet Take 1 tablet by mouth every 8 (eight) hours as needed for moderate pain. 07/09/19   Rosemarie Ax, MD  lidocaine-prilocaine (EMLA) cream Apply 1 application topically as needed. 05/21/17   [name], MD  prochlorperazine (COMPAZINE) 10 MG tablet Take 1 tablet (10 mg total) by mouth every 6 (six) hours as needed for nausea or vomiting. Patient not taking: Reported on 06/10/2019 05/21/17   [name], MD  traMADol (ULTRAM) 50 MG tablet Take 1 tablet (50 mg total) by mouth every 6 (six) hours as needed. 06/01/19   [name], MD    Family History Family History  Problem Relation Age of Onset  . Hypertension Mother   . Heart attack Mother   . Heart disease Mother        CHF/  After age 50  . Heart attack Father   . Heart disease Father        Irregular Heart beat, Heart Disease before age 37  . Cancer Brother        throat  . Breast cancer Neg Hx   . Colon cancer Neg Hx   . Prostate cancer Neg Hx     Social History Social History   Tobacco Use  . Smoking status: Current Every Day Smoker    Years: 50.00    Types: Cigarettes  . Smokeless tobacco: Never Used  . Tobacco comment: pt states that he is going to quit completely; 1/2 pack per day  Substance Use Topics  . Alcohol use: Not Currently  . Drug use: No     Allergies   Patient has no known allergies.   Review of Systems Review of Systems  Constitutional: Negative for  fever.  HENT: Negative for congestion.   Respiratory: Negative for cough.   Cardiovascular: Negative for chest pain.  Gastrointestinal: Negative for abdominal pain.  Genitourinary: Positive for flank pain.  Musculoskeletal: Positive for arthralgias and back pain.  Skin: Negative for color change.  Neurological: Negative for weakness.  Hematological: Negative for adenopathy.     Physical Exam Triage Vital Signs ED Triage Vitals  Enc Vitals Group     BP 07/09/19 1113 135/84     Pulse Rate 07/09/19 1113 (!) 106     Resp 07/09/19 1113 20  Temp 07/09/19 1113 97.9 F (36.6 C)     Temp Source 07/09/19 1113 Oral     SpO2 07/09/19 1113 100 %     Weight --      Height --      Head Circumference --      Peak Flow --      Pain Score 07/09/19 1114 4     Pain Loc --      Pain Edu? --      Excl. in [place]? --    No data found.  Updated Vital Signs BP 135/84   Pulse (!) 106   Temp 97.9 F (36.6 C) (Oral)   Resp 20   SpO2 100%   Visual Acuity Right Eye Distance:   Left Eye Distance:   Bilateral Distance:    Right Eye Near:   Left Eye Near:    Bilateral Near:     Physical Exam Gen: NAD, alert, cooperative with exam, well-appearing ENT: normal lips, normal nasal mucosa,  Eye: normal EOM, normal conjunctiva and lids CV:  no edema, +2 pedal pulses   Resp: no accessory muscle use, non-labored,  GI: no masses or tenderness, no hernia  Skin: no rashes, no areas of induration  Neuro: normal tone, normal sensation to touch Psych:  normal insight, alert and oriented MSK:  Right flank:  No midline thoracic spine tenderness  No step offs  Positive CVA tenderness.  NVI    UC Treatments / Results  Labs (all labs ordered are listed, but only abnormal results are displayed) Labs Reviewed  CBC - Abnormal; Notable for the following components:      Result Value   Hemoglobin 12.5 (*)    HCT 38.2 (*)    RDW 18.3 (*)    All other components within normal limits  COMPREHENSIVE  METABOLIC PANEL - Abnormal; Notable for the following components:   Glucose, Bld 113 (*)    Albumin 3.0 (*)    Alkaline Phosphatase 509 (*)    All other components within normal limits  POCT URINALYSIS DIP (DEVICE)    EKG   Radiology No results found.  Procedures Procedures (including critical care time)  Medications Ordered in UC Medications - No data to display  Initial Impression / Assessment and Plan / UC Course  I have reviewed the triage vital signs and the nursing notes.  Pertinent labs & imaging results that were available during my care of the patient were reviewed by me and considered in my medical decision making (see chart for details).     Mr. Bowersock is a 73 year old male that is presenting with right flank pain.  Possible for muscle spasm.  Less likely for nephrolithiasis as urinalysis did show hematuria.  He reports having history of a kidney infection this feels similar.  He looks well and does not appear to be infectious today.  We will obtain a CBC as well as a CMP.  Provided norco for pain. May need to have a CT done outpatient if symptoms are ongoing.  Counseled on supportive care.  Given indications return to follow-up.  Final Clinical Impressions(s) / UC Diagnoses   Final diagnoses:  Flank pain     Discharge Instructions     Please take the pain medication for severe pain.  Please avoid ibuprofen or advil  Please go to the emergency room if the pain worsens  Please follow up if the pain is ongoing.     ED Prescriptions    Medication Sig Dispense  Auth. Provider   HYDROcodone-acetaminophen (NORCO/VICODIN) 5-325 MG tablet Take 1 tablet by mouth every 8 (eight) hours as needed for moderate pain. 15 tablet Rosemarie Ax, MD     I have reviewed the PDMP during this encounter.   Rosemarie Ax, MD 07/09/19 [phone]

## 2019-07-09 NOTE — Discharge Instructions (Signed)
Please take the pain medication for severe pain.  Please avoid ibuprofen or advil  Please go to the emergency room if the pain worsens  Please follow up if the pain is ongoing.

## 2019-07-09 NOTE — ED Triage Notes (Signed)
C/O right flank pain since yesterday.  Denies any unusual activity or lifting.  Pain worse with breathing and movement, and slightly worse with palpation.  Pt states feels like when he had kidney infection.  Denies fevers.

## 2019-07-13 ENCOUNTER — Telehealth: Payer: Self-pay | Admitting: Family Medicine

## 2019-07-13 ENCOUNTER — Other Ambulatory Visit: Payer: Self-pay | Admitting: Oncology

## 2019-07-13 ENCOUNTER — Telehealth: Payer: Self-pay

## 2019-07-13 MED ORDER — HYDROCODONE-ACETAMINOPHEN 5-325 MG PO TABS: 1.0000 | ORAL_TABLET | Freq: Three times a day (TID) | ORAL | 0 refills | Status: DC | PRN

## 2019-07-13 NOTE — Telephone Encounter (Signed)
Contacted patient  And made him aware of the refill for the Norco that Dr. [name] Blew will send in and explained that a referral for radiation is also being placed. Patient stated that he would like radiation to start soon. Explained that the holiday can effect scheduling but he should hear something on Monday but he can call to follow up if concerned. Patient verbalized understanding and had no other questions or concerns.

## 2019-07-13 NOTE — Telephone Encounter (Signed)
-----   Message from [name], MD sent at 07/13/2019  3:54 PM EST ----- I will refill his Norco and refer to radiation for palliative radiation to chest wall. Thanks ----- Message ----- From: Scot Dock, RN Sent: 07/13/2019   3:39 PM EST To: [name], MD  He stated that he is having pain in his "rib area" mostly right-sided for about 2 weeks, worse with deep breath. He stated that the tramadol is not effective and rates his pain at an 8 with pain medications. He was seen in the ED on 11/21. Looks like they prescribed Norco #15 tabs which he did not mention.

## 2019-07-13 NOTE — Telephone Encounter (Signed)
Informed of results.   Rosemarie Ax, MD [facility] 07/13/2019, 12:12 PM

## 2019-07-18 NOTE — Progress Notes (Signed)
Histology and Location of Primary Cancer: malignant neoplasm of prostate metastatic to bone  Sites of Visceral and Bony Metastatic Disease: diffuse bony disease  Location(s) of Symptomatic Metastases: right chest wall and rib area  Past/Anticipated chemotherapy by medical oncology, if any:  Prior Therapy: He underwent a prostatectomy with a pathology that showed prostatic adenocarcinoma. Gleason score 4+4 equals 8, with the tumor involving the margins and the pathologic staging was pT4 N1 with 1 of 2 left pelvic lymph nodes involved.   He was started on adjuvant hormone therapy with Lupron. He developed a rising PSA and bone metastasis.   He was then treated with ketoconazole and prednisone. He developed a rising PSA with a doubling time of just over 6 months. Bone scan in September 2013 showed increased uptake in the right lower lumbar spine at L4 and L5 and abnormal uptake in the mid sternum which had progressed since the prior bone scan.  Zytiga 1000 mg daily started in October 2013. Discontinued in October 2018 after progression of disease.  Taxotere chemotherapy at 75 mg per meter square started on 06/02/2017. The dose was reduced to 60 mg/m starting with cycle 3. He completed 10 cycles of therapy and April 2019.   Xtandi 160 mg daily started in August 2019. Therapy discontinued in December 2019 after progression of disease.  Jevtana chemotherapy started on August 04, 2018 and receiving 20 mg/m. He completed 6 cycles of therapy in June 2020.  Current therapy:   Lupron 30 mg every 4 months.Next injection of Eligard will be in January 2021.   Pain on a scale of 0-10 is: Right sided rib pain x 2 weeks worse with deep breath and movement. Reports pain is intermittent but worse at night. Pain not managed with Tramadol. Patient given Norco in ED on 11/21. Script refilled by Dr. [name] Blew.    If Spine Met(s), symptoms, if any, include:  Bowel/Bladder retention or  incontinence (please describe): Reports nocturia x 1-2. Denies dysuria or hematuria. Reports a steady stream without difficulty emptying his bladder. Denies urinary leakage or incontinence. Reports having a bowel movement yesterday but admits to struggling with constipation.   Numbness or weakness in extremities (please describe): Intermittent inability to bear weight on his right leg related to pain. Uses a cane to ambulate. States, "I never know when my leg is going to give out; I step and there is nothing there sometimes." Denies numbness in his legs are arms.   Current Decadron regimen, if applicable: denies  Ambulatory status? Walker? Wheelchair?: Ambulatory with assistance of cane  SAFETY ISSUES:  Prior radiation? yes, right femur final tx on 06/29/2019  Pacemaker/ICD? no  Possible current pregnancy? no, male patient  Is the patient on methotrexate? no  Current Complaints / other details:  73 year old male. Married with one son.

## 2019-07-19 ENCOUNTER — Other Ambulatory Visit: Payer: Self-pay

## 2019-07-19 ENCOUNTER — Inpatient Hospital Stay: Payer: Medicare Other

## 2019-07-19 ENCOUNTER — Encounter: Payer: Self-pay | Admitting: Radiation Oncology

## 2019-07-19 ENCOUNTER — Ambulatory Visit
Admission: RE | Admit: 2019-07-19 | Discharge: 2019-07-19 | Disposition: A | Discharge status: Home / Self Care | Payer: Medicare Other | Source: Ambulatory Visit | Attending: Radiation Oncology | Admitting: Radiation Oncology

## 2019-07-19 ENCOUNTER — Inpatient Hospital Stay: Payer: Medicare Other | Attending: Oncology

## 2019-07-19 VITALS — Ht 69.0 in | Wt 171.0 lb

## 2019-07-19 DIAGNOSIS — D630 Anemia in neoplastic disease: Secondary | ICD-10-CM | POA: Diagnosis not present

## 2019-07-19 DIAGNOSIS — T451X5A Adverse effect of antineoplastic and immunosuppressive drugs, initial encounter: Secondary | ICD-10-CM | POA: Diagnosis not present

## 2019-07-19 DIAGNOSIS — C61 Malignant neoplasm of prostate: Secondary | ICD-10-CM | POA: Insufficient documentation

## 2019-07-19 DIAGNOSIS — Z923 Personal history of irradiation: Secondary | ICD-10-CM | POA: Diagnosis not present

## 2019-07-19 DIAGNOSIS — Z510 Encounter for antineoplastic radiation therapy: Secondary | ICD-10-CM | POA: Diagnosis not present

## 2019-07-19 DIAGNOSIS — G893 Neoplasm related pain (acute) (chronic): Secondary | ICD-10-CM | POA: Diagnosis not present

## 2019-07-19 DIAGNOSIS — D6481 Anemia due to antineoplastic chemotherapy: Secondary | ICD-10-CM | POA: Diagnosis not present

## 2019-07-19 DIAGNOSIS — C7951 Secondary malignant neoplasm of bone: Secondary | ICD-10-CM | POA: Diagnosis not present

## 2019-07-19 DIAGNOSIS — Z95828 Presence of other vascular implants and grafts: Secondary | ICD-10-CM

## 2019-07-19 DIAGNOSIS — Z79818 Long term (current) use of other agents affecting estrogen receptors and estrogen levels: Secondary | ICD-10-CM | POA: Diagnosis not present

## 2019-07-19 DIAGNOSIS — Z7982 Long term (current) use of aspirin: Secondary | ICD-10-CM | POA: Diagnosis not present

## 2019-07-19 DIAGNOSIS — Z9221 Personal history of antineoplastic chemotherapy: Secondary | ICD-10-CM | POA: Insufficient documentation

## 2019-07-19 DIAGNOSIS — R079 Chest pain, unspecified: Secondary | ICD-10-CM | POA: Insufficient documentation

## 2019-07-19 DIAGNOSIS — Z79899 Other long term (current) drug therapy: Secondary | ICD-10-CM | POA: Insufficient documentation

## 2019-07-19 DIAGNOSIS — Z452 Encounter for adjustment and management of vascular access device: Secondary | ICD-10-CM | POA: Insufficient documentation

## 2019-07-19 DIAGNOSIS — R634 Abnormal weight loss: Secondary | ICD-10-CM | POA: Insufficient documentation

## 2019-07-19 DIAGNOSIS — Z9079 Acquired absence of other genital organ(s): Secondary | ICD-10-CM | POA: Insufficient documentation

## 2019-07-19 DIAGNOSIS — C7952 Secondary malignant neoplasm of bone marrow: Secondary | ICD-10-CM

## 2019-07-19 DIAGNOSIS — Z192 Hormone resistant malignancy status: Secondary | ICD-10-CM | POA: Diagnosis not present

## 2019-07-19 LAB — CBC WITH DIFFERENTIAL (CANCER CENTER ONLY)
Abs Immature Granulocytes: 0.03 10*3/uL (ref 0.00–0.07)
Basophils Absolute: 0 10*3/uL (ref 0.0–0.1)
Basophils Relative: 1 %
Eosinophils Absolute: 0.1 10*3/uL (ref 0.0–0.5)
Eosinophils Relative: 1 %
HCT: 33.2 % — ABNORMAL LOW (ref 39.0–52.0)
Hemoglobin: 10.8 g/dL — ABNORMAL LOW (ref 13.0–17.0)
Immature Granulocytes: 0 %
Lymphocytes Relative: 21 %
Lymphs Abs: 1.4 10*3/uL (ref 0.7–4.0)
MCH: 29.3 pg (ref 26.0–34.0)
MCHC: 32.5 g/dL (ref 30.0–36.0)
MCV: 90.2 fL (ref 80.0–100.0)
Monocytes Absolute: 0.7 10*3/uL (ref 0.1–1.0)
Monocytes Relative: 11 %
Neutro Abs: 4.4 10*3/uL (ref 1.7–7.7)
Neutrophils Relative %: 66 %
Platelet Count: 294 10*3/uL (ref 150–400)
RBC: 3.68 MIL/uL — ABNORMAL LOW (ref 4.22–5.81)
RDW: 18.2 % — ABNORMAL HIGH (ref 11.5–15.5)
WBC Count: 6.7 10*3/uL (ref 4.0–10.5)
nRBC: 0 % (ref 0.0–0.2)

## 2019-07-19 LAB — CMP (CANCER CENTER ONLY)
ALT: 34 U/L (ref 0–44)
AST: 20 U/L (ref 15–41)
Albumin: 2.7 g/dL — ABNORMAL LOW (ref 3.5–5.0)
Alkaline Phosphatase: 566 U/L — ABNORMAL HIGH (ref 38–126)
Anion gap: 10 (ref 5–15)
BUN: 15 mg/dL (ref 8–23)
CO2: 19 mmol/L — ABNORMAL LOW (ref 22–32)
Calcium: 8.8 mg/dL — ABNORMAL LOW (ref 8.9–10.3)
Chloride: 105 mmol/L (ref 98–111)
Creatinine: 0.78 mg/dL (ref 0.61–1.24)
GFR, Est AFR Am: >60 mL/min (ref >60)
GFR, Estimated: >60 mL/min (ref >60)
Glucose, Bld: 114 mg/dL — ABNORMAL HIGH (ref 70–99)
Potassium: 4 mmol/L (ref 3.5–5.1)
Sodium: 134 mmol/L — ABNORMAL LOW (ref 135–145)
Total Bilirubin: 0.4 mg/dL (ref 0.3–1.2)
Total Protein: 6.8 g/dL (ref 6.5–8.1)

## 2019-07-19 MED ORDER — HEPARIN SOD (PORK) LOCK FLUSH 100 UNIT/ML IV SOLN
500.0000 [IU] | Freq: Once | INTRAVENOUS | Status: AC | PRN
  Administered 2019-07-19: 12:00:00 500 [IU] via INTRAVENOUS
  Filled 2019-07-19: qty 5

## 2019-07-19 MED ORDER — SODIUM CHLORIDE 0.9% FLUSH
10.0000 mL | INTRAVENOUS | Status: DC | PRN
  Administered 2019-07-19: 12:00:00 10 mL via INTRAVENOUS
  Filled 2019-07-19: qty 10

## 2019-07-19 NOTE — Progress Notes (Addendum)
Radiation Oncology         [phone] ________________________________  Outpatient Re-Consultation  Name: Alec Harris MRN: [id number]  Date: 07/19/2019  DOB: 05/18/1946  [id number], No Pcp Per  [name], MD   REFERRING PHYSICIAN: [name], MD  DIAGNOSIS: 73 y.o. gentleman with metastatic, castrate-resistant prostate cancer with diffuse bony disease and painful lesions in the right posterior ribs and sternum.    ICD-10-CM   1. Secondary malignant neoplasm of bone and bone marrow (HCC)  C79.51    C79.52     HISTORY OF PRESENT ILLNESS: Alec Harris is a 73 y.o. male with a diagnosis of metastatic, castrate-resistant prostate cancer. He was initially diagnosed with prostate cancer with Dr. Lowella Bandy in 2003 and opted to undergo a radical retropubic prostatectomy with BPLND on 12/20/2001. Surgical pathology showed Gleason 4+4 disease with extracapsular extension to the right and left apex as well as the posterior lateral margins, bladder neck involvement, bilateral seminal vesicle involvement, and a positive lymph node (1/3). He was subsequently started on Lupron.  He unfortunately developed castration resistant disease with a rising PSA and was initially treated with prednisone and ketoconazole until 2013 when he developed progression with asymptomatic bone metastasis. He referred to Dr. [name] Blew and treated with Zytiga from 05/2012 to 05/2017.  This was discontinued due to disease progression and he was switched to Taxotere from 05/2017 to 11/2017 (completed 10 cycles) with PSA decreased to 2.2 in 11/2017.  He went on observation only for a short time but his PSA increased 6.3 in 03/2018 so he was started on Xtandi from 03/2018 to 07/2018.  Unfortunately his PSA continued to rise despite Xtandi so this was discontinued and he was switched to [place] in 07/2018 to 01/2019. He had a good response to the Jevtana initially and completed 6 cycles on 01/2018.  He has been on a treatment break  since that time and unfortunately, a follow-up PSA on 04/14/2019 was back up to 39.5.  Restaging CT A/P on 04/27/2019 showed widespread patchy sclerotic osseous metastases throughout the visualized skeleton but no lymphadenopathy or other findings of visceral metastatic disease.  His recent bone scan on 05/05/2019 again confirmed diffuse severe multifocal areas of increased uptake consistent with metastatic disease with increased activity within a right upper posterior rib but improved previously-identified skull lesions.  His PSA has also continued to climb, from 20 on 03/08/2019 to 39.5 on 04/14/19 and most recently, 111 on 05/31/2019. At the time of his previous visit with [place] in 05/2019, he was complaining of severe pain in the right femur, progressive over the prior 6 weeks without known injury.  He has not had any falls around the house and has been using a cane and/or rolling walker to assist him with his safe mobility. He denied associated paresthesias or focal weakness in the lower extremities but reported that the pain would occasionally wake him from sleep at night.  He denied any back pain or bowel or bladder dysfunction and denied any other sites of focal, specific bone pain.  He elected to proceed with palliative radiotherapy to the right femur which was completed 06/29/19 and he reported significant improvement in his pain. He also agreed to proceed with Xofigo infusions for management of the diffuse bony disease and we are still awaiting insurance approval for this treatment so that we can move forward with scheduling this in the near future.  More recently, he presented to the ED on 07/09/19 with complaints of increasing bone  pain in the sternum and posterior right ribs, associated with known sites of metastatic disease.  He has been taking Vicodin for his pain which helps temporarily but is becoming less effective.  He has kindly been referred back today, at the request of Dr. [name] Blew, for  discussion of potential palliative radiation to the new sites of painful disease to help with pain management.  PREVIOUS RADIATION THERAPY: Yes: 06/16/2019 - 06/29/2019:  Right Femur / 30 Gy in 10 fractions  PAST MEDICAL HISTORY:  Past Medical History:  Diagnosis Date   Carotid artery disease ([place])    By doppler   Claudication ([place]) 02/06/2012   ABI of 0.5 on both legs ,Rgt ext. iliac70 to 99%,rgt SFA occlude,lft ext.iliac common fem 70 to 99% ,Lft SFA occluded,mono flow popliteal   Hyperlipemia    Hypertension    PAD (peripheral artery disease) ([place]) 01/2012   severe diffuse his iliacs,SFA, carotids   Prostate cancer ([place])    Rectal bleeding 07/24/11   Diverticular Bleeding   Tobacco abuse       PAST SURGICAL HISTORY: Past Surgical History:  Procedure Laterality Date   CAROTID ANGIOGRAM  03/02/2012   bilateral carotid artery stenosis,95% right and 80% left with a type 2 arch   CAROTID ANGIOGRAM N/A 03/02/2012   Procedure: CAROTID ANGIOGRAM;  Surgeon: Lorretta Harp, MD;  Location[facility] CATH LAB;  Service: Cardiovascular;  Laterality: N/A;   CAROTID ENDARTERECTOMY Right 04-08-12   CEA   CAROTID STENT INSERTION Left 07/05/2013   Procedure: CAROTID STENT INSERTION;  Surgeon: Serafina Mitchell, MD;  Location: [facility] CATH LAB;  Service: Cardiovascular;  Laterality: Left;   CERVICAL SPINE SURGERY  approx. 10 years ago   disc removed from neck   COLONOSCOPY  07/25/2011   Procedure: COLONOSCOPY;  Surgeon: Beryle Beams;  Location: [facility] ENDOSCOPY;  Service: Endoscopy;  Laterality: N/A;   DOPPLER ECHOCARDIOGRAPHY  03/24/2012   EF >70% LV normal   ENDARTERECTOMY  04/08/2012   Procedure: ENDARTERECTOMY CAROTID;  Surgeon: Serafina Mitchell, MD;  Location: [facility] OR;  Service: Vascular;  Laterality: Right;  Right carotid endarterectomy with vascuguard 1cm x 6cm bovine patch angioplasty.    IR FLUORO GUIDE PORT INSERTION RIGHT  06/05/2017   IR US GUIDE VASC ACCESS RIGHT  06/05/2017   KNEE  CARTILAGE SURGERY     surgery for a torn ligament   Lexiscan Myoview  01/14/2012   small fixed basal to mid inferior bowel attenuation artifact. no reversible ischemia   LOWER EXTREMITY ANGIOGRAM N/A 03/02/2012   Procedure: LOWER EXTREMITY ANGIOGRAM;  Surgeon: Lorretta Harp, MD;  Location: [facility] CATH LAB;  Service: Cardiovascular;  Laterality: N/A;   [place] SURGERY  2003   for  prostate cancer   PV angiogram  03/02/2012   total SFAs bilaterally with 3- vessel runoff below the knee, high-grade calcified iliac diseaese    FAMILY HISTORY:  Family History  Problem Relation Age of Onset   Hypertension Mother    Heart attack Mother    Heart disease Mother        CHF/  After age 35   Heart attack Father    Heart disease Father        Irregular Heart beat, Heart Disease before age 86   Cancer Brother        throat   Breast cancer Neg Hx    Colon cancer Neg Hx    Prostate cancer Neg Hx  SOCIAL HISTORY:  Social History   Socioeconomic History   Marital status: Married    Spouse name: Not on file   Number of children: Not on file   Years of education: Not on file   Highest education level: Not on file  Occupational History   Not on file  Social Needs   Financial resource strain: Not on file   Food insecurity    Worry: Not on file    Inability: Not on file   Transportation needs    Medical: Not on file    Non-medical: Not on file  Tobacco Use   Smoking status: Current Every Day Smoker    Years: 50.00    Types: Cigarettes   Smokeless tobacco: Never Used   Tobacco comment: pt states that he is going to quit completely; 1/2 pack per day  Substance and Sexual Activity   Alcohol use: Not Currently   Drug use: No   Sexual activity: Not Currently    Birth control/protection: None  Lifestyle   Physical activity    Days per week: Not on file    Minutes per session: Not on file   Stress: Not on file    Relationships   Social connections    Talks on phone: Not on file    Gets together: Not on file    Attends religious service: Not on file    Active member of club or organization: Not on file    Attends meetings of clubs or organizations: Not on file    Relationship status: Not on file   Intimate partner violence    Fear of current or ex partner: Not on file    Emotionally abused: Not on file    Physically abused: Not on file    Forced sexual activity: Not on file  Other Topics Concern   Not on file  Social History Narrative   Not on file    ALLERGIES: Patient has no known allergies.  MEDICATIONS:  Current Outpatient Medications  Medication Sig Dispense Refill   amLODipine (NORVASC) 5 MG tablet TAKE 1 TABLET (5 MG TOTAL) BY MOUTH DAILY. 90 tablet 2   aspirin EC 81 MG tablet Take 81 mg by mouth daily.     atorvastatin (LIPITOR) 20 MG tablet TAKE 1 TABLET BY MOUTH EVERY DAY 90 tablet 2   Calcium Carb-Cholecalciferol (CALCIUM 500 +D) 500-400 MG-UNIT TABS Take 1 tablet by mouth daily.      cilostazol (PLETAL) 100 MG tablet TAKE 1 TABLET BY MOUTH TWICE A DAY 180 tablet 2   HYDROcodone-acetaminophen (NORCO/VICODIN) 5-325 MG tablet Take 1 tablet by mouth every 8 (eight) hours as needed for moderate pain. 30 tablet 0   lidocaine-prilocaine (EMLA) cream Apply 1 application topically as needed. 30 g 0   megestrol (MEGACE) 40 MG/ML suspension TAKE 10 MLS (400 MG TOTAL) BY MOUTH DAILY. 240 mL 0   prochlorperazine (COMPAZINE) 10 MG tablet Take 1 tablet (10 mg total) by mouth every 6 (six) hours as needed for nausea or vomiting. (Patient not taking: Reported on 06/10/2019) 30 tablet 0   traMADol (ULTRAM) 50 MG tablet Take 1 tablet (50 mg total) by mouth every 6 (six) hours as needed. (Patient not taking: Reported on 07/19/2019) 30 tablet 0   No current facility-administered medications for this encounter.    Facility-Administered Medications Ordered in Other Encounters   Medication Dose Route Frequency Provider Last Rate Last Dose   sodium chloride flush (NS) 0.9 % injection 10 mL  10  mL Intravenous PRN [name], MD   10 mL at 07/19/19 1157    REVIEW OF SYSTEMS:  On review of systems, the patient reports that he is doing well overall. He denies any chest pain, shortness of breath, cough, fevers, chills, night sweats, unintended weight changes. He denies any bowel disturbances, and denies abdominal pain, nausea or vomiting. He reports increasing pain to his sternum and right posterior ribs, at the level of his axilla. He has not had recent fall or injury and denies paraesthesias or focal weakness in the upper extremities.  He denies any further pain in the right femur since completion of palliative radiotherapy in 06/2019.  He does report nocturia x1-2, a steady stream without difficulty emptying his bladder, and some constipation. He denies dysuria or hematuria, urinary leakage or incontinence. A complete review of systems is obtained and is otherwise negative.  PHYSICAL EXAM:  Wt Readings from Last 3 Encounters:  07/19/19 171 lb (77.6 kg)  06/10/19 171 lb 14.4 oz (78 kg)  06/01/19 171 lb 14.4 oz (78 kg)   Temp Readings from Last 3 Encounters:  07/09/19 97.9 F (36.6 C) (Oral)  06/10/19 98.3 F (36.8 C) (Oral)  06/01/19 98.5 F (36.9 C) (Oral)   BP Readings from Last 3 Encounters:  07/09/19 135/84  06/10/19 (!) 142/75  06/01/19 124/62   Pulse Readings from Last 3 Encounters:  07/09/19 (!) 106  06/10/19 82  06/01/19 100   Pain Assessment Pain Score: 0-No pain(intermittent chest pain and right posterior rib pain eased with advil)/10  In general this is a well appearing African-American male in no acute distress. He's alert and oriented x4 and appropriate throughout the examination. Cardiopulmonary assessment is negative for acute distress and he exhibits normal effort.   KPS = 70  100 - Normal; no complaints; no evidence of disease. 90    - Able to carry on normal activity; minor signs or symptoms of disease. 80   - Normal activity with effort; some signs or symptoms of disease. 69   - Cares for self; unable to carry on normal activity or to do active work. 60   - Requires occasional assistance, but is able to care for most of his personal needs. 50   - Requires considerable assistance and frequent medical care. 64   - Disabled; requires special care and assistance. 30   - Severely disabled; hospital admission is indicated although death not imminent. 61   - Very sick; hospital admission necessary; active supportive treatment necessary. 10   - Moribund; fatal processes progressing rapidly. 0     - Dead  Karnofsky DA, Abelmann Collier, Craver LS and [facility] [phone]) The use of the nitrogen mustards in the palliative treatment of carcinoma: with particular reference to bronchogenic carcinoma Cancer 1 634-56  LABORATORY DATA:  Lab Results  Component Value Date   WBC 6.7 07/19/2019   HGB 10.8 (L) 07/19/2019   HCT 33.2 (L) 07/19/2019   MCV 90.2 07/19/2019   PLT 294 07/19/2019   Lab Results  Component Value Date   NA 134 (L) 07/19/2019   K 4.0 07/19/2019   CL 105 07/19/2019   CO2 19 (L) 07/19/2019   Lab Results  Component Value Date   ALT 34 07/19/2019   AST 20 07/19/2019   ALKPHOS 566 (H) 07/19/2019   BILITOT 0.4 07/19/2019     RADIOGRAPHY: No results found.    IMPRESSION/PLAN: 1. 73 y.o. gentleman with metastatic, castrate-resistant prostate cancer with diffuse bony  disease and painful lesions in the right posterior ribs and sternum.  Today, we talked to the patient about the findings and workup thus far. We discussed the natural history of metastatic prostate cancer and general treatment, highlighting the role of palliative external beam radiation in the management of painful bony disease. We focused on the details and logistics of delivery. The recommendation is to proceed with a 2 week course of daily palliative  radiation to the painful sites of disease in the sternum and posterior right rib.  We will also follow up on insurance authorization for Trudi Ida so that we can coordinate monthly infusions of Xofigo x6 for management of the diffuse bony metastases to begin in the near future. We will monitor labs prior to each infusion to ensure it is safe to proceed with each treatment. We reviewed the anticipated acute and late sequelae associated with palliative radiation and Xofigo in this setting. The patient was encouraged to ask questions that were answered to his satisfaction.  At the end of our conversation, the patient elects to proceed with a 2 week course of daily palliative EBRT to the painful bony lesions in the right posterior rib and sternum. We will share this information with Dr. [name] Blew and move forward with treatment planning accordingly.  He will undergo CT simulation/treatment planning following our visit today in anticipation of beginning his palliative external beam radiation in the near future.  We will continue with coordination of Xofigo infusions , once approved with hi insurance with plans to begin in the near future as well.  He appears to have a good understanding of his disease and our recommendations which are of palliative intent and he is comfortable and in agreement with the stated plan.  He knows to call with any questions or concerns in the interim.    Nicholos Johns, PA-C    Tyler Pita, MD  [facility] Direct Dial: [phone]   Fax: [phone] [email or web address]   Skype   [email or web address]  This document serves as a record of services personally performed by Tyler Pita, MD and Freeman Caldron, PA-C. It was created on their behalf by Wilburn Mylar, a trained medical scribe. The creation of this record is based on the scribe's personal observations and the provider's statements to them. This document has been checked and approved by the attending provider.

## 2019-07-19 NOTE — Patient Instructions (Signed)

## 2019-07-19 NOTE — Progress Notes (Incomplete)
°  Radiation Oncology         [phone] ________________________________  Name: Alec Harris MRN: [id number]  Date: 06/29/2019  DOB: [birth year]  End of Treatment Note  Diagnosis:   73 y.o. gentleman with metastatic, castrate-resistant prostate cancer with diffuse bony disease and painful lesion in the right femur     Indication for treatment:  Palliative       Radiation treatment dates:   06/16/2019 - 06/29/2019  Site/dose:   Right Femur / 30 Gy in 10 fractions  Beams/energy:   Complex Isodose, photons / 6X, 10X  Narrative: The patient tolerated radiation treatment relatively well. He reported improvement in his right femur pain. By the end of treatment, he was able to stand and place weight on his right leg. He denied numbness or tingling in his extremities. He also reported diarrhea resolved with imodium as well as fatigue. He denied headache, dizziness, and nausea or vomiting.   Plan: The patient has completed radiation treatment. The patient will return to radiation oncology clinic for routine followup in one month. I advised him to call or return sooner if he has any questions or concerns related to his recovery or treatment. ________________________________  Sheral Apley. Tammi Klippel, M.D.   This document serves as a record of services personally performed by Tyler Pita, MD. It was created on his behalf by Wilburn Mylar, a trained medical scribe. The creation of this record is based on the scribe's personal observations and the provider's statements to them. This document has been checked and approved by the attending provider.

## 2019-07-19 NOTE — Progress Notes (Signed)
See progress note under physician encounter. 

## 2019-07-20 ENCOUNTER — Other Ambulatory Visit (HOSPITAL_COMMUNITY): Payer: Self-pay | Admitting: Radiation Oncology

## 2019-07-20 ENCOUNTER — Telehealth: Payer: Self-pay | Admitting: *Deleted

## 2019-07-20 DIAGNOSIS — C61 Malignant neoplasm of prostate: Secondary | ICD-10-CM

## 2019-07-20 DIAGNOSIS — C7951 Secondary malignant neoplasm of bone: Secondary | ICD-10-CM

## 2019-07-20 LAB — PROSTATE-SPECIFIC AG, SERUM (LABCORP): Prostate Specific Ag, Serum: 221 ng/mL — ABNORMAL HIGH (ref 0.0–4.0)

## 2019-07-20 NOTE — Telephone Encounter (Signed)
Called patient to inform of lab and weight appt. for 07-26-19 @ 1 pm and his Xofigo Inj. for 08-02-19 - arrival time- 12:45 pm @ [facility], spoke with patient and he is aware of these appts.

## 2019-07-21 ENCOUNTER — Inpatient Hospital Stay (HOSPITAL_BASED_OUTPATIENT_CLINIC_OR_DEPARTMENT_OTHER): Payer: Medicare Other | Admitting: Oncology

## 2019-07-21 ENCOUNTER — Other Ambulatory Visit: Payer: Self-pay

## 2019-07-21 ENCOUNTER — Other Ambulatory Visit: Payer: Self-pay | Admitting: Oncology

## 2019-07-21 VITALS — BP 134/69 | HR 109 | Temp 98.3°F | Resp 18 | Ht 69.0 in | Wt 165.5 lb

## 2019-07-21 DIAGNOSIS — I6529 Occlusion and stenosis of unspecified carotid artery: Secondary | ICD-10-CM | POA: Diagnosis not present

## 2019-07-21 DIAGNOSIS — C61 Malignant neoplasm of prostate: Secondary | ICD-10-CM

## 2019-07-21 NOTE — Progress Notes (Signed)
Hematology and Oncology Follow Up Visit  Alec Harris [id number] 27-Aug-1945 73 y.o. 07/21/2019 9:50 AM Patient, No Pcp Alec Saas, MD   Principle Diagnosis: 73 year old man with advanced prostate cancer with disease to the bone diagnosed in 2013.  He has castration-resistant at this time. =  Prior Therapy:  He underwent a prostatectomy with a pathology that showed prostatic adenocarcinoma. Gleason score 4+4 equals 8, with the tumor involving the margins and the pathologic staging was pT4 N1 with 1 of 2 left pelvic lymph nodes involved.   He was started on adjuvant hormone therapy with Lupron. He developed a rising PSA and bone metastasis.   He was then treated with ketoconazole and prednisone. He developed a rising PSA with a doubling time of just over 6 months. Bone scan in September 2013 showed increased uptake in the right lower lumbar spine at L4 and L5 and abnormal uptake in the mid sternum which had progressed since the prior bone scan.  Zytiga 1000 mg daily started in October 2013.  Discontinued in October 2018 after progression of disease.  Taxotere chemotherapy at 75 mg per meter square started on 06/02/2017.  The dose was reduced to 60 mg/m starting with cycle 3.  He completed 10 cycles of therapy and April 2019.   Xtandi 160 mg daily started in August 2019.  Therapy discontinued in December 2019 after progression of disease.  Jevtana chemotherapy started on August 04, 2018 and receiving 20 mg/m.  He completed 6 cycles of therapy in June 2020.   Current therapy:   Lupron 30 mg every 4 months.  Next injection of Eligard will be in January 2021.  Under consideration to start Ekron.  He is getting palliative radiation    Interim History: Alec Harris returns today for a follow-up.  Since the last visit, he reports some decline in his overall health with increased pain in his chest wall as well as thoracic area.  He is ambulating inside of his house but does require  some assistance at a doctor's office.  His appetite has also decreased and of lost some weight.  He did use Ultram which felt that he was an adequate for his pain.  He was prescribed hydrocodone which he tried for a couple days although stopped taking it because of side effects and muscle stiffness.  He is currently using Advil only.  He is scheduled to start radiation in the near future.  He denied any alteration mental status, neuropathy, confusion or dizziness.  Denies any headaches or lethargy.  Denies any night sweats, weight loss or changes in appetite.  Denied orthopnea, dyspnea on exertion or chest discomfort.  Denies shortness of breath, difficulty breathing hemoptysis or cough.  Denies any abdominal distention, nausea, early satiety or dyspepsia.  Denies any hematuria, frequency, dysuria or nocturia.  Denies any skin irritation, dryness or rash.  Denies any ecchymosis or petechiae.  Denies any lymphadenopathy or clotting.  Denies any heat or cold intolerance.  Denies any anxiety or depression.  Remaining review of system is negative.    Medications: Updated today on review. Current Outpatient Medications  Medication Sig Dispense Refill  . amLODipine (NORVASC) 5 MG tablet TAKE 1 TABLET (5 MG TOTAL) BY MOUTH DAILY. 90 tablet 2  . aspirin EC 81 MG tablet Take 81 mg by mouth daily.    [name] atorvastatin (LIPITOR) 20 MG tablet TAKE 1 TABLET BY MOUTH EVERY DAY 90 tablet 2  . Calcium Carb-Cholecalciferol (CALCIUM 500 +D) 500-400 MG-UNIT TABS  Take 1 tablet by mouth daily.     . cilostazol (PLETAL) 100 MG tablet TAKE 1 TABLET BY MOUTH TWICE A DAY 180 tablet 2  . HYDROcodone-acetaminophen (NORCO/VICODIN) 5-325 MG tablet Take 1 tablet by mouth every 8 (eight) hours as needed for moderate pain. 30 tablet 0  . lidocaine-prilocaine (EMLA) cream Apply 1 application topically as needed. 30 g 0  . megestrol (MEGACE) 40 MG/ML suspension TAKE 10 MLS (400 MG TOTAL) BY MOUTH DAILY. 240 mL 0  . prochlorperazine  (COMPAZINE) 10 MG tablet Take 1 tablet (10 mg total) by mouth every 6 (six) hours as needed for nausea or vomiting. (Patient not taking: Reported on 06/10/2019) 30 tablet 0  . traMADol (ULTRAM) 50 MG tablet Take 1 tablet (50 mg total) by mouth every 6 (six) hours as needed. (Patient not taking: Reported on 07/19/2019) 30 tablet 0   No current facility-administered medications for this visit.    His past medical history, family history and social history unchanged today on review.   Allergies: No Known Allergies   Physical Exam:    Blood pressure 134/69, pulse (!) 109, temperature 98.3 F (36.8 C), temperature source Temporal, resp. rate 18, height 5\' 9"  (1.753 m), weight 165 lb 8 oz (75.1 kg), SpO2 100 %.     ECOG: 1    General appearance: Comfortable appearing without any discomfort Head: Normocephalic without any trauma Oropharynx: Mucous membranes are moist and pink without any thrush or ulcers. Eyes: Pupils are equal and round reactive to light. Lymph nodes: No cervical, supraclavicular, inguinal or axillary lymphadenopathy.   Heart:regular rate and rhythm.  S1 and S2 without leg edema. Lung: Clear without any rhonchi or wheezes.  No dullness to percussion. Abdomin: Soft, nontender, nondistended with good bowel sounds.  No hepatosplenomegaly. Musculoskeletal: No joint deformity or effusion.  Full range of motion noted. Neurological: No deficits noted on motor, sensory and deep tendon reflex exam. Skin: No petechial rash or dryness.  Appeared moist.               Lab Results: Lab Results  Component Value Date   WBC 6.7 07/19/2019   HGB 10.8 (L) 07/19/2019   HCT 33.2 (L) 07/19/2019   MCV 90.2 07/19/2019   PLT 294 07/19/2019     Chemistry      Component Value Date/Time   NA 134 (L) 07/19/2019 1210   NA 137 08/05/2017 0839   K 4.0 07/19/2019 1210   K 3.9 08/05/2017 0839   CL 105 07/19/2019 1210   CL 106 02/01/2013 1120   CO2 19 (L) 07/19/2019 1210    CO2 23 08/05/2017 0839   BUN 15 07/19/2019 1210   BUN 12.2 08/05/2017 0839   CREATININE 0.78 07/19/2019 1210   CREATININE 0.9 08/05/2017 0839      Component Value Date/Time   CALCIUM 8.8 (L) 07/19/2019 1210   CALCIUM 8.6 08/05/2017 0839   ALKPHOS 566 (H) 07/19/2019 1210   ALKPHOS 146 08/05/2017 0839   AST 20 07/19/2019 1210   AST 7 08/05/2017 0839   ALT 34 07/19/2019 1210   ALT <6 08/05/2017 0839   BILITOT 0.4 07/19/2019 1210   BILITOT 0.46 08/05/2017 0839       Results for DATHAN, BANASZEWSKI (MRN [id number]) as of 07/21/2019 09:53  Ref. Range 05/31/2019 09:55 07/19/2019 12:10  Prostate Specific Ag, Serum Latest Ref Range: 0.0 - 4.0 ng/mL 111.0 (H) 221.0 (H)       Impression and Plan:   73 year old man with:  1.  Advanced prostate cancer with disease to the bone diagnosed in 2013.  He has castration-resistant disease.  The natural course of his disease was updated.  Treatment options were also reiterated.  He is currently under evaluation for Trudi Ida which would be reasonable given his diffuse bone disease that is symptomatic.  Alternative options would be to restart systemic chemotherapy as well as a PARP inhibitor if he harbors the appropriate mutation.  He also understands at some point he will run out of treatment options and supportive management and hospice will be next.   2. Androgen deprivation.  Long-term complication associated with androgen deprivation was reviewed today.  These include weight gain, hot flashes and osteoporosis.  He will receive next Eligard in January 2021.   3. Bone directed therapy.  He has deferred Xgeva option and currently on calcium and vitamin D supplements.  4. Anemia: Related to malignancy as well as chemotherapy.  Hemoglobin is adequate at this time.  5. Bone pain: Manageable at this time with only nonsteroidal anti-inflammatories.  Hydrocodone is available to him which she opted not to use.  6. IV access: Port-A-Cath remains in place  and will be flushed periodically.  7.  Prognosis and goals of care: Therapy remains palliative at this time he has incurable disease.  Aggressive measures remains warranted given his recent performance status.  8. Follow up: In January 2021 for repeat evaluation and Eligard injection.  25 minutes was spent face-to-face with the patient today.  More than 50% was dedicated to updating his disease status, treatment options as well as outlining complications related to current and future therapies.     Zola Button, MD 12/3/20209:50 AM

## 2019-07-22 DIAGNOSIS — Z510 Encounter for antineoplastic radiation therapy: Secondary | ICD-10-CM | POA: Diagnosis not present

## 2019-07-24 NOTE — Progress Notes (Signed)
  Radiation Oncology         [phone] ________________________________  Name: Alec Harris MRN: [id number]  Date: 07/19/2019  DOB: June 16, 1946  SIMULATION AND TREATMENT PLANNING NOTE    ICD-10-CM   1. Malignant neoplasm of prostate metastatic to bone ([place])  C61    C79.51     DIAGNOSIS:  73 yo man with painful right rib and sternal metastases from prostate cancer  NARRATIVE:  The patient was brought to the [facility].  Identity was confirmed.  All relevant records and images related to the planned course of therapy were reviewed.  The patient freely provided informed written consent to proceed with treatment after reviewing the details related to the planned course of therapy. The consent form was witnessed and verified by the simulation staff.  Then, the patient was set-up in a stable reproducible  supine position for radiation therapy.  CT images were obtained.  Surface markings were placed.  The CT images were loaded into the planning software.  Then the target and avoidance structures were contoured.  Treatment planning then occurred.  The radiation prescription was entered and confirmed.  Then, I designed and supervised the construction of a total of at least 3 medically necessary complex treatment devices including MLCs to shield heart, lungs and spinal cord.  I have requested : 3D Simulation  I have requested a DVH of the following structures: left lung, right lung, heart, spinal cord and target.  PLAN:  The patient will receive 30 Gy in 10 fractions.  ________________________________  Sheral Apley Tammi Klippel, M.D.

## 2019-07-25 ENCOUNTER — Ambulatory Visit
Admission: RE | Admit: 2019-07-25 | Discharge: 2019-07-25 | Disposition: A | Discharge status: Home / Self Care | Payer: Medicare Other | Source: Ambulatory Visit | Attending: Radiation Oncology | Admitting: Radiation Oncology

## 2019-07-25 ENCOUNTER — Other Ambulatory Visit: Payer: Self-pay

## 2019-07-25 ENCOUNTER — Telehealth: Payer: Self-pay | Admitting: *Deleted

## 2019-07-25 ENCOUNTER — Other Ambulatory Visit: Payer: Self-pay | Admitting: Radiation Oncology

## 2019-07-25 DIAGNOSIS — C7951 Secondary malignant neoplasm of bone: Secondary | ICD-10-CM

## 2019-07-25 DIAGNOSIS — C61 Malignant neoplasm of prostate: Secondary | ICD-10-CM

## 2019-07-25 DIAGNOSIS — Z510 Encounter for antineoplastic radiation therapy: Secondary | ICD-10-CM | POA: Diagnosis not present

## 2019-07-25 NOTE — Telephone Encounter (Signed)
CALLED PATIENT TO REMIND TO GET LAB AND WEIGHT ON 07/26/19, SPOKE WITH PATIENT AND HE IS AWARE OF THIS APPT.

## 2019-07-26 ENCOUNTER — Other Ambulatory Visit: Payer: Self-pay

## 2019-07-26 ENCOUNTER — Ambulatory Visit
Admission: RE | Admit: 2019-07-26 | Discharge: 2019-07-26 | Disposition: A | Discharge status: Home / Self Care | Payer: Medicare Other | Source: Ambulatory Visit | Attending: Radiation Oncology | Admitting: Radiation Oncology

## 2019-07-26 DIAGNOSIS — Z510 Encounter for antineoplastic radiation therapy: Secondary | ICD-10-CM | POA: Diagnosis not present

## 2019-07-26 DIAGNOSIS — C61 Malignant neoplasm of prostate: Secondary | ICD-10-CM

## 2019-07-26 DIAGNOSIS — C7952 Secondary malignant neoplasm of bone marrow: Secondary | ICD-10-CM

## 2019-07-26 LAB — CBC WITH DIFFERENTIAL (CANCER CENTER ONLY)
Abs Immature Granulocytes: 0.04 10*3/uL (ref 0.00–0.07)
Basophils Absolute: 0 10*3/uL (ref 0.0–0.1)
Basophils Relative: 0 %
Eosinophils Absolute: 0.1 10*3/uL (ref 0.0–0.5)
Eosinophils Relative: 1 %
HCT: 32.1 % — ABNORMAL LOW (ref 39.0–52.0)
Hemoglobin: 10.5 g/dL — ABNORMAL LOW (ref 13.0–17.0)
Immature Granulocytes: 1 %
Lymphocytes Relative: 15 %
Lymphs Abs: 1.1 10*3/uL (ref 0.7–4.0)
MCH: 29.8 pg (ref 26.0–34.0)
MCHC: 32.7 g/dL (ref 30.0–36.0)
MCV: 91.2 fL (ref 80.0–100.0)
Monocytes Absolute: 0.9 10*3/uL (ref 0.1–1.0)
Monocytes Relative: 12 %
Neutro Abs: 5.1 10*3/uL (ref 1.7–7.7)
Neutrophils Relative %: 71 %
Platelet Count: 262 10*3/uL (ref 150–400)
RBC: 3.52 MIL/uL — ABNORMAL LOW (ref 4.22–5.81)
RDW: 18.6 % — ABNORMAL HIGH (ref 11.5–15.5)
WBC Count: 7.3 10*3/uL (ref 4.0–10.5)
nRBC: 0 % (ref 0.0–0.2)

## 2019-07-27 ENCOUNTER — Other Ambulatory Visit: Payer: Self-pay

## 2019-07-27 ENCOUNTER — Ambulatory Visit
Admission: RE | Admit: 2019-07-27 | Discharge: 2019-07-27 | Disposition: A | Discharge status: Home / Self Care | Payer: Medicare Other | Source: Ambulatory Visit | Attending: Radiation Oncology | Admitting: Radiation Oncology

## 2019-07-27 DIAGNOSIS — Z510 Encounter for antineoplastic radiation therapy: Secondary | ICD-10-CM | POA: Diagnosis not present

## 2019-07-28 ENCOUNTER — Other Ambulatory Visit: Payer: Self-pay

## 2019-07-28 ENCOUNTER — Ambulatory Visit
Admission: RE | Admit: 2019-07-28 | Discharge: 2019-07-28 | Disposition: A | Discharge status: Home / Self Care | Payer: Medicare Other | Source: Ambulatory Visit | Attending: Radiation Oncology | Admitting: Radiation Oncology

## 2019-07-28 DIAGNOSIS — Z510 Encounter for antineoplastic radiation therapy: Secondary | ICD-10-CM | POA: Diagnosis not present

## 2019-07-29 ENCOUNTER — Ambulatory Visit
Admission: RE | Admit: 2019-07-29 | Discharge: 2019-07-29 | Disposition: A | Discharge status: Home / Self Care | Payer: Medicare Other | Source: Ambulatory Visit | Attending: Radiation Oncology | Admitting: Radiation Oncology

## 2019-07-29 ENCOUNTER — Other Ambulatory Visit: Payer: Self-pay

## 2019-07-29 DIAGNOSIS — Z510 Encounter for antineoplastic radiation therapy: Secondary | ICD-10-CM | POA: Diagnosis not present

## 2019-08-01 ENCOUNTER — Ambulatory Visit
Admission: RE | Admit: 2019-08-01 | Discharge: 2019-08-01 | Disposition: A | Discharge status: Home / Self Care | Payer: Medicare Other | Source: Ambulatory Visit | Attending: Radiation Oncology | Admitting: Radiation Oncology

## 2019-08-01 ENCOUNTER — Telehealth: Payer: Self-pay | Admitting: *Deleted

## 2019-08-01 ENCOUNTER — Other Ambulatory Visit: Payer: Self-pay

## 2019-08-01 DIAGNOSIS — Z510 Encounter for antineoplastic radiation therapy: Secondary | ICD-10-CM | POA: Diagnosis not present

## 2019-08-01 NOTE — Telephone Encounter (Signed)
CALLED PATIENT TO REMIND OF XOFIGO INJ. FOR 08-02-19 - ARRIVAL TIME- 12:45 PM FOR 1 PM INJ., SPOKE WITH PATIENT AND HE IS AWARE OF THIS INJECTION

## 2019-08-02 ENCOUNTER — Ambulatory Visit (HOSPITAL_COMMUNITY)
Admission: RE | Admit: 2019-08-02 | Discharge: 2019-08-02 | Disposition: A | Discharge status: Home / Self Care | Payer: Medicare Other | Source: Ambulatory Visit | Attending: Radiation Oncology | Admitting: Radiation Oncology

## 2019-08-02 ENCOUNTER — Other Ambulatory Visit: Payer: Self-pay

## 2019-08-02 ENCOUNTER — Ambulatory Visit (HOSPITAL_COMMUNITY): Payer: Medicare Other

## 2019-08-02 ENCOUNTER — Ambulatory Visit: Payer: Medicare Other

## 2019-08-02 DIAGNOSIS — C61 Malignant neoplasm of prostate: Secondary | ICD-10-CM | POA: Insufficient documentation

## 2019-08-02 DIAGNOSIS — C7951 Secondary malignant neoplasm of bone: Secondary | ICD-10-CM | POA: Diagnosis not present

## 2019-08-02 MED ORDER — RADIUM RA 223 DICHLORIDE 30 MCCI/ML IV SOLN
107.8200 | Freq: Once | INTRAVENOUS | Status: AC | PRN
  Administered 2019-08-02: 13:00:00 107.82 via INTRAVENOUS

## 2019-08-03 ENCOUNTER — Other Ambulatory Visit: Payer: Self-pay

## 2019-08-03 ENCOUNTER — Ambulatory Visit
Admission: RE | Admit: 2019-08-03 | Discharge: 2019-08-03 | Disposition: A | Discharge status: Home / Self Care | Payer: Medicare Other | Source: Ambulatory Visit | Attending: Radiation Oncology | Admitting: Radiation Oncology

## 2019-08-03 DIAGNOSIS — Z510 Encounter for antineoplastic radiation therapy: Secondary | ICD-10-CM | POA: Diagnosis not present

## 2019-08-04 ENCOUNTER — Ambulatory Visit
Admission: RE | Admit: 2019-08-04 | Discharge: 2019-08-04 | Disposition: A | Discharge status: Home / Self Care | Payer: Medicare Other | Source: Ambulatory Visit | Attending: Radiation Oncology | Admitting: Radiation Oncology

## 2019-08-04 ENCOUNTER — Ambulatory Visit: Payer: Medicare Other | Admitting: Urology

## 2019-08-04 DIAGNOSIS — Z510 Encounter for antineoplastic radiation therapy: Secondary | ICD-10-CM | POA: Diagnosis not present

## 2019-08-04 LAB — GUARDANT 360

## 2019-08-05 ENCOUNTER — Other Ambulatory Visit: Payer: Self-pay

## 2019-08-05 ENCOUNTER — Ambulatory Visit
Admission: RE | Admit: 2019-08-05 | Discharge: 2019-08-05 | Disposition: A | Discharge status: Home / Self Care | Payer: Medicare Other | Source: Ambulatory Visit | Attending: Radiation Oncology | Admitting: Radiation Oncology

## 2019-08-05 ENCOUNTER — Ambulatory Visit: Payer: Medicare Other

## 2019-08-05 DIAGNOSIS — Z510 Encounter for antineoplastic radiation therapy: Secondary | ICD-10-CM | POA: Diagnosis not present

## 2019-08-08 ENCOUNTER — Other Ambulatory Visit: Payer: Self-pay

## 2019-08-08 ENCOUNTER — Ambulatory Visit
Admission: RE | Admit: 2019-08-08 | Discharge: 2019-08-08 | Disposition: A | Discharge status: Home / Self Care | Payer: Medicare Other | Source: Ambulatory Visit | Attending: Radiation Oncology | Admitting: Radiation Oncology

## 2019-08-08 ENCOUNTER — Other Ambulatory Visit: Payer: Self-pay | Admitting: Oncology

## 2019-08-08 ENCOUNTER — Encounter: Payer: Self-pay | Admitting: Radiation Oncology

## 2019-08-08 DIAGNOSIS — Z510 Encounter for antineoplastic radiation therapy: Secondary | ICD-10-CM | POA: Diagnosis not present

## 2019-08-10 NOTE — Progress Notes (Signed)
  Radiation Oncology         [phone] ________________________________  Name: Alec Harris MRN: [id number]  Date: 08/08/2019  DOB: [birth year]  End of Treatment Note  Diagnosis:   73 yo man with painful right rib and sternal metastases from prostate cancer     Indication for treatment:  Palliation       Radiation treatment dates:   07/25/19-08/08/19  Site/dose:   A field arrangement including the right ribs and sternum was treated to 30 Gy in 10 fractions of 3 Gy  Beams/energy:   A 3D field set-up was employed with 6 MV X-rays  Narrative: The patient tolerated radiation treatment relatively well.   No acute toxicities were noted.  Plan: The patient has completed radiation treatment. The patient will return to radiation oncology clinic for routine followup in one month. I advised him to call or return sooner if he has any questions or concerns related to his recovery or treatment. ________________________________  Sheral Apley. Tammi Klippel, M.D.

## 2019-08-13 NOTE — Progress Notes (Signed)
  Radiation Oncology         [phone] ________________________________  Name: Enzi Barnhard MRN: [id number]  Date: 08/02/2019  DOB: Jan 09, 1946  Radium-223 Infusion Note  Diagnosis:  Castration resistant prostate cancer with painful bone involvement  Current Infusion:    1  Planned Infusions:  6  Narrative: Mr. Luthur Lindmark presented to nuclear medicine for treatment. His most recent blood counts were reviewed.  He remains a good candidate to proceed with Ra-223.  The patient was situated in an infusion suite with a contact barrier placed under his arm. Intravenous access was established, using sterile technique, and a normal saline infusion from a syringe was started.  Micro-dosimetry:  The prescribed radiation activity was assayed and confirmed to be within specified tolerance.  Special Treatment Procedure - Infusion:  The nuclear medicine technologist and I personally verified the dose activity to be delivered as specified in the written directive, and verified the patient identification via 2 separate methods.  The syringe containing the dose was attached to an intravenous access and the dose delivered over a minute. No complications were noted.  The total administered dose was 110.5 microcuries.   A saline flush of the line and the syringe that contained the isotope was then performed.  The residual radioactivity in the syringe was 2.68 microcuries, so the actual infused isotope activity was the difference.   Pressure was applied to the venipuncture site, and a compression bandage placed.   Radiation Safety personnel were present to perform the discharge survey, as detailed on their documentation.   After a short period of observation, the patient had his IV removed.  Impression:  The patient tolerated his infusion relatively well.  Plan:  The patient will return in one month for ongoing care.    ________________________________  Sheral Apley. Tammi Klippel, M.D.

## 2019-08-16 ENCOUNTER — Inpatient Hospital Stay (HOSPITAL_COMMUNITY): Admission: RE | Admit: 2019-08-16 | Payer: Medicare Other | Source: Ambulatory Visit

## 2019-08-22 ENCOUNTER — Other Ambulatory Visit (HOSPITAL_COMMUNITY): Payer: Self-pay | Admitting: Radiation Oncology

## 2019-08-22 ENCOUNTER — Telehealth: Payer: Self-pay | Admitting: *Deleted

## 2019-08-22 DIAGNOSIS — C61 Malignant neoplasm of prostate: Secondary | ICD-10-CM

## 2019-08-22 DIAGNOSIS — C7951 Secondary malignant neoplasm of bone: Secondary | ICD-10-CM

## 2019-08-22 NOTE — Telephone Encounter (Signed)
CALLED PATIENT TO INFORM OF LAB AND WEIGHT  ON 08-30-19 @ 12 PM @ [place]. ON 09-06-19 - ARRIVAL TIME- 11:15 AM @ [facility], SPOKE WITH PATIENT AND HE IS AWARE OF THESE APPTS.

## 2019-08-26 ENCOUNTER — Other Ambulatory Visit: Payer: Self-pay | Admitting: Radiation Oncology

## 2019-08-26 ENCOUNTER — Other Ambulatory Visit: Payer: Self-pay

## 2019-08-26 DIAGNOSIS — C7951 Secondary malignant neoplasm of bone: Secondary | ICD-10-CM

## 2019-08-26 DIAGNOSIS — C61 Malignant neoplasm of prostate: Secondary | ICD-10-CM

## 2019-08-29 ENCOUNTER — Inpatient Hospital Stay: Payer: Medicare Other | Attending: Oncology

## 2019-08-29 ENCOUNTER — Inpatient Hospital Stay: Payer: Medicare Other

## 2019-08-29 ENCOUNTER — Other Ambulatory Visit: Payer: Self-pay

## 2019-08-29 ENCOUNTER — Telehealth: Payer: Self-pay | Admitting: *Deleted

## 2019-08-29 DIAGNOSIS — Z79818 Long term (current) use of other agents affecting estrogen receptors and estrogen levels: Secondary | ICD-10-CM | POA: Insufficient documentation

## 2019-08-29 DIAGNOSIS — Z79899 Other long term (current) drug therapy: Secondary | ICD-10-CM | POA: Insufficient documentation

## 2019-08-29 DIAGNOSIS — C61 Malignant neoplasm of prostate: Secondary | ICD-10-CM | POA: Diagnosis not present

## 2019-08-29 DIAGNOSIS — C7951 Secondary malignant neoplasm of bone: Secondary | ICD-10-CM | POA: Insufficient documentation

## 2019-08-29 DIAGNOSIS — Z7982 Long term (current) use of aspirin: Secondary | ICD-10-CM | POA: Diagnosis not present

## 2019-08-29 DIAGNOSIS — Z9079 Acquired absence of other genital organ(s): Secondary | ICD-10-CM | POA: Insufficient documentation

## 2019-08-29 DIAGNOSIS — Z923 Personal history of irradiation: Secondary | ICD-10-CM | POA: Insufficient documentation

## 2019-08-29 DIAGNOSIS — C7952 Secondary malignant neoplasm of bone marrow: Secondary | ICD-10-CM

## 2019-08-29 DIAGNOSIS — Z452 Encounter for adjustment and management of vascular access device: Secondary | ICD-10-CM | POA: Insufficient documentation

## 2019-08-29 DIAGNOSIS — Z9221 Personal history of antineoplastic chemotherapy: Secondary | ICD-10-CM | POA: Diagnosis not present

## 2019-08-29 DIAGNOSIS — Z95828 Presence of other vascular implants and grafts: Secondary | ICD-10-CM

## 2019-08-29 LAB — CMP (CANCER CENTER ONLY)
ALT: 16 U/L (ref 0–44)
AST: 14 U/L — ABNORMAL LOW (ref 15–41)
Albumin: 2.9 g/dL — ABNORMAL LOW (ref 3.5–5.0)
Alkaline Phosphatase: 833 U/L — ABNORMAL HIGH (ref 38–126)
Anion gap: 9 (ref 5–15)
BUN: 12 mg/dL (ref 8–23)
CO2: 19 mmol/L — ABNORMAL LOW (ref 22–32)
Calcium: 8.1 mg/dL — ABNORMAL LOW (ref 8.9–10.3)
Chloride: 106 mmol/L (ref 98–111)
Creatinine: 0.77 mg/dL (ref 0.61–1.24)
GFR, Est AFR Am: >60 mL/min (ref >60)
GFR, Estimated: >60 mL/min (ref >60)
Glucose, Bld: 103 mg/dL — ABNORMAL HIGH (ref 70–99)
Potassium: 4.3 mmol/L (ref 3.5–5.1)
Sodium: 134 mmol/L — ABNORMAL LOW (ref 135–145)
Total Bilirubin: 0.5 mg/dL (ref 0.3–1.2)
Total Protein: 6.6 g/dL (ref 6.5–8.1)

## 2019-08-29 LAB — CBC WITH DIFFERENTIAL (CANCER CENTER ONLY)
Abs Immature Granulocytes: 0.02 10*3/uL (ref 0.00–0.07)
Basophils Absolute: 0 10*3/uL (ref 0.0–0.1)
Basophils Relative: 0 %
Eosinophils Absolute: 0.1 10*3/uL (ref 0.0–0.5)
Eosinophils Relative: 1 %
HCT: 32.6 % — ABNORMAL LOW (ref 39.0–52.0)
Hemoglobin: 10.7 g/dL — ABNORMAL LOW (ref 13.0–17.0)
Immature Granulocytes: 0 %
Lymphocytes Relative: 28 %
Lymphs Abs: 1.3 10*3/uL (ref 0.7–4.0)
MCH: 30.1 pg (ref 26.0–34.0)
MCHC: 32.8 g/dL (ref 30.0–36.0)
MCV: 91.6 fL (ref 80.0–100.0)
Monocytes Absolute: 0.6 10*3/uL (ref 0.1–1.0)
Monocytes Relative: 13 %
Neutro Abs: 2.5 10*3/uL (ref 1.7–7.7)
Neutrophils Relative %: 58 %
Platelet Count: 146 10*3/uL — ABNORMAL LOW (ref 150–400)
RBC: 3.56 MIL/uL — ABNORMAL LOW (ref 4.22–5.81)
RDW: 18.7 % — ABNORMAL HIGH (ref 11.5–15.5)
WBC Count: 4.5 10*3/uL (ref 4.0–10.5)
nRBC: 0 % (ref 0.0–0.2)

## 2019-08-29 MED ORDER — SODIUM CHLORIDE 0.9% FLUSH
10.0000 mL | INTRAVENOUS | Status: DC | PRN
  Administered 2019-08-29: 10 mL via INTRAVENOUS
  Filled 2019-08-29: qty 10

## 2019-08-29 MED ORDER — HEPARIN SOD (PORK) LOCK FLUSH 100 UNIT/ML IV SOLN
500.0000 [IU] | Freq: Once | INTRAVENOUS | Status: AC | PRN
  Administered 2019-08-29: 15:00:00 500 [IU] via INTRAVENOUS
  Filled 2019-08-29: qty 5

## 2019-08-29 NOTE — Telephone Encounter (Signed)
CALLED PATIENT TO REMIND OF LAB AND WEIGHT ON 08-30-19 @ 12 PM @ [place], SPOKE WITH PATIENT AND HE IS AWARE OF THIS APPT.

## 2019-08-30 ENCOUNTER — Other Ambulatory Visit: Payer: Self-pay

## 2019-08-30 ENCOUNTER — Telehealth: Payer: Self-pay | Admitting: Cardiovascular Disease

## 2019-08-30 ENCOUNTER — Ambulatory Visit
Admission: RE | Admit: 2019-08-30 | Discharge: 2019-08-30 | Disposition: A | Discharge status: Home / Self Care | Payer: Medicare Other | Source: Ambulatory Visit | Attending: Radiation Oncology | Admitting: Radiation Oncology

## 2019-08-30 DIAGNOSIS — C7951 Secondary malignant neoplasm of bone: Secondary | ICD-10-CM | POA: Insufficient documentation

## 2019-08-30 DIAGNOSIS — C7952 Secondary malignant neoplasm of bone marrow: Secondary | ICD-10-CM

## 2019-08-30 DIAGNOSIS — Z923 Personal history of irradiation: Secondary | ICD-10-CM | POA: Diagnosis not present

## 2019-08-30 DIAGNOSIS — C61 Malignant neoplasm of prostate: Secondary | ICD-10-CM | POA: Insufficient documentation

## 2019-08-30 LAB — CBC WITH DIFFERENTIAL (CANCER CENTER ONLY)
Abs Immature Granulocytes: 0.03 10*3/uL (ref 0.00–0.07)
Basophils Absolute: 0 10*3/uL (ref 0.0–0.1)
Basophils Relative: 0 %
Eosinophils Absolute: 0.1 10*3/uL (ref 0.0–0.5)
Eosinophils Relative: 1 %
HCT: 33.7 % — ABNORMAL LOW (ref 39.0–52.0)
Hemoglobin: 10.8 g/dL — ABNORMAL LOW (ref 13.0–17.0)
Immature Granulocytes: 1 %
Lymphocytes Relative: 36 %
Lymphs Abs: 1.5 10*3/uL (ref 0.7–4.0)
MCH: 30.3 pg (ref 26.0–34.0)
MCHC: 32 g/dL (ref 30.0–36.0)
MCV: 94.4 fL (ref 80.0–100.0)
Monocytes Absolute: 0.5 10*3/uL (ref 0.1–1.0)
Monocytes Relative: 12 %
Neutro Abs: 2.1 10*3/uL (ref 1.7–7.7)
Neutrophils Relative %: 50 %
Platelet Count: 143 10*3/uL — ABNORMAL LOW (ref 150–400)
RBC: 3.57 MIL/uL — ABNORMAL LOW (ref 4.22–5.81)
RDW: 18.6 % — ABNORMAL HIGH (ref 11.5–15.5)
WBC Count: 4.2 10*3/uL (ref 4.0–10.5)
nRBC: 0 % (ref 0.0–0.2)

## 2019-08-30 LAB — PROSTATE-SPECIFIC AG, SERUM (LABCORP): Prostate Specific Ag, Serum: 245 ng/mL — ABNORMAL HIGH (ref 0.0–4.0)

## 2019-08-30 NOTE — Telephone Encounter (Signed)
The patient stated that he was ready to have the pv angiogram completed with Dr. Gwenlyn Found. He is due to have a Carotid, ABI, LEA and Aorta/Iliac.

## 2019-08-30 NOTE — Telephone Encounter (Signed)
Patient returning Lisa's call °

## 2019-08-30 NOTE — Telephone Encounter (Signed)
Left a message for the patient to call back. He did not have his Carotid, ABI, Aorta/Iliac and LEA completed yet.

## 2019-08-30 NOTE — Telephone Encounter (Signed)
Patient is calling stating he had previously spoken wit Dr. Gwenlyn Found in regards to having a stent put in his leg, but never went through with having it done. He is now wanting to know if Dr. Gwenlyn Found can do still perform the procedure. Please advise.

## 2019-08-31 ENCOUNTER — Other Ambulatory Visit: Payer: Self-pay

## 2019-08-31 ENCOUNTER — Telehealth: Payer: Self-pay | Admitting: Oncology

## 2019-08-31 ENCOUNTER — Inpatient Hospital Stay (HOSPITAL_BASED_OUTPATIENT_CLINIC_OR_DEPARTMENT_OTHER): Payer: Medicare Other | Admitting: Oncology

## 2019-08-31 ENCOUNTER — Inpatient Hospital Stay: Payer: Medicare Other

## 2019-08-31 VITALS — BP 124/59 | HR 90 | Temp 98.3°F | Resp 17 | Ht 69.0 in | Wt 163.8 lb

## 2019-08-31 DIAGNOSIS — C7951 Secondary malignant neoplasm of bone: Secondary | ICD-10-CM | POA: Diagnosis not present

## 2019-08-31 DIAGNOSIS — C61 Malignant neoplasm of prostate: Secondary | ICD-10-CM | POA: Diagnosis not present

## 2019-08-31 DIAGNOSIS — Z95828 Presence of other vascular implants and grafts: Secondary | ICD-10-CM

## 2019-08-31 DIAGNOSIS — Z9079 Acquired absence of other genital organ(s): Secondary | ICD-10-CM | POA: Diagnosis not present

## 2019-08-31 DIAGNOSIS — Z79818 Long term (current) use of other agents affecting estrogen receptors and estrogen levels: Secondary | ICD-10-CM | POA: Diagnosis not present

## 2019-08-31 DIAGNOSIS — Z452 Encounter for adjustment and management of vascular access device: Secondary | ICD-10-CM | POA: Diagnosis not present

## 2019-08-31 DIAGNOSIS — Z9221 Personal history of antineoplastic chemotherapy: Secondary | ICD-10-CM | POA: Diagnosis not present

## 2019-08-31 MED ORDER — LEUPROLIDE ACETATE (4 MONTH) 30 MG ~~LOC~~ KIT
30.0000 mg | PACK | Freq: Once | SUBCUTANEOUS | Status: AC
  Administered 2019-08-31: 30 mg via SUBCUTANEOUS
  Filled 2019-08-31: qty 30

## 2019-08-31 NOTE — Telephone Encounter (Signed)
Needs ROV with me to discuss PV angio

## 2019-08-31 NOTE — Telephone Encounter (Signed)
Scheduled appt per 1/13 los.  Sent a message to HIM pool to get a calendar mailed out. 

## 2019-08-31 NOTE — Progress Notes (Signed)
Hematology and Oncology Follow Up Visit  Alec Schleeter [id number] Nov 11, 1945 74 y.o. 08/31/2019 2:50 PM Patient, No Pcp PerShadad, [name], MD   Principle Diagnosis: 59 year old man with castration-resistant prostate cancer with disease to the bone since 2013.  He presented with localized disease at the time of diagnosis with a Gleason score of 4+4 = 8.  Prior Therapy:  He underwent a prostatectomy with a pathology that showed prostatic adenocarcinoma. Gleason score 4+4 equals 8, with the tumor involving the margins and the pathologic staging was pT4 N1 with 1 of 2 left pelvic lymph nodes involved.   He was started on adjuvant hormone therapy with Lupron. He developed a rising PSA and bone metastasis.   He was then treated with ketoconazole and prednisone. He developed a rising PSA with a doubling time of just over 6 months. Bone scan in September 2013 showed increased uptake in the right lower lumbar spine at L4 and L5 and abnormal uptake in the mid sternum which had progressed since the prior bone scan.  Zytiga 1000 mg daily started in October 2013.  Discontinued in October 2018 after progression of disease.  Taxotere chemotherapy at 75 mg per meter square started on 06/02/2017.  The dose was reduced to 60 mg/m starting with cycle 3.  He completed 10 cycles of therapy and April 2019.   Xtandi 160 mg daily started in August 2019.  Therapy discontinued in December 2019 after progression of disease.  Jevtana chemotherapy started on August 04, 2018 and receiving 20 mg/m.  He completed 6 cycles of therapy in June 2020.  He is status post palliative radiation therapy to the ribs and sternum completed in December 2020.  Received 30 Gray in 10 fractions between December 7 and December 21.  Current therapy:   Lupron 30 mg every 4 months.  Next injection of Eligard will be in January 2021.  Trudi Ida on a monthly basis started in December 2020.    Interim History: Alec Harris returns today for  a repeat evaluation.  Since the last visit, he reports no major changes in his health.  He received Xofigo last month without any major complications at this time.  He denies any worsening bone pain or pathological fractures.  He denies any recent hospitalization or illnesses.  He does report claudication in his left leg which has limited his mobility.  He does take Advil at times because of like pain specially with activity.    Medications: Updated today on review. Current Outpatient Medications  Medication Sig Dispense Refill  . amLODipine (NORVASC) 5 MG tablet TAKE 1 TABLET (5 MG TOTAL) BY MOUTH DAILY. 90 tablet 2  . aspirin EC 81 MG tablet Take 81 mg by mouth daily.    [name] atorvastatin (LIPITOR) 20 MG tablet TAKE 1 TABLET BY MOUTH EVERY DAY 90 tablet 2  . Calcium Carb-Cholecalciferol (CALCIUM 500 +D) 500-400 MG-UNIT TABS Take 1 tablet by mouth daily.     . cilostazol (PLETAL) 100 MG tablet TAKE 1 TABLET BY MOUTH TWICE A DAY 180 tablet 2  . HYDROcodone-acetaminophen (NORCO/VICODIN) 5-325 MG tablet Take 1 tablet by mouth every 8 (eight) hours as needed for moderate pain. 30 tablet 0  . lidocaine-prilocaine (EMLA) cream Apply 1 application topically as needed. 30 g 0  . megestrol (MEGACE) 40 MG/ML suspension TAKE 10 MLS (400 MG TOTAL) BY MOUTH DAILY. 240 mL 0  . prochlorperazine (COMPAZINE) 10 MG tablet Take 1 tablet (10 mg total) by mouth every 6 (six) hours as  needed for nausea or vomiting. (Patient not taking: Reported on 06/10/2019) 30 tablet 0  . traMADol (ULTRAM) 50 MG tablet Take 1 tablet (50 mg total) by mouth every 6 (six) hours as needed. (Patient not taking: Reported on 07/19/2019) 30 tablet 0   No current facility-administered medications for this visit.      Allergies: No Known Allergies   Physical Exam:    Blood pressure (!) 124/59, pulse 90, temperature 98.3 F (36.8 C), temperature source Temporal, resp. rate 17, height 5\' 9"  (1.753 m), weight 163 lb 12.8 oz (74.3 kg),  SpO2 100 %.      ECOG: 1      General appearance: Alert, awake without any distress. Head: Atraumatic without abnormalities Oropharynx: Without any thrush or ulcers. Eyes: No scleral icterus. Lymph nodes: No lymphadenopathy noted in the cervical, supraclavicular, or axillary nodes Heart:regular rate and rhythm, without any murmurs or gallops.   Lung: Clear to auscultation without any rhonchi, wheezes or dullness to percussion. Abdomin: Soft, nontender without any shifting dullness or ascites. Musculoskeletal: No clubbing or cyanosis. Neurological: No motor or sensory deficits. Skin: No rashes or lesions.                Lab Results: Lab Results  Component Value Date   WBC 4.2 08/30/2019   HGB 10.8 (L) 08/30/2019   HCT 33.7 (L) 08/30/2019   MCV 94.4 08/30/2019   PLT 143 (L) 08/30/2019     Chemistry      Component Value Date/Time   NA 134 (L) 08/29/2019 1501   NA 137 08/05/2017 0839   K 4.3 08/29/2019 1501   K 3.9 08/05/2017 0839   CL 106 08/29/2019 1501   CL 106 02/01/2013 1120   CO2 19 (L) 08/29/2019 1501   CO2 23 08/05/2017 0839   BUN 12 08/29/2019 1501   BUN 12.2 08/05/2017 0839   CREATININE 0.77 08/29/2019 1501   CREATININE 0.9 08/05/2017 0839      Component Value Date/Time   CALCIUM 8.1 (L) 08/29/2019 1501   CALCIUM 8.6 08/05/2017 0839   ALKPHOS 833 (H) 08/29/2019 1501   ALKPHOS 146 08/05/2017 0839   AST 14 (L) 08/29/2019 1501   AST 7 08/05/2017 0839   ALT 16 08/29/2019 1501   ALT <6 08/05/2017 0839   BILITOT 0.5 08/29/2019 1501   BILITOT 0.46 08/05/2017 0839       Results for Alec Harris, Alec Harris (MRN [id number]) as of 08/31/2019 14:30  Ref. Range 07/19/2019 12:10 08/29/2019 15:01  Prostate Specific Ag, Serum Latest Ref Range: 0.0 - 4.0 ng/mL 221.0 (H) 245.0 (H)        Impression and Plan:   74 year old man with:  1.  Castration-resistant prostate cancer with disease to the bone noted since 2013.    He status post therapy as  outlined above and currently on Xofigo given his painful bony metastasis.  His PSA is showing a slight increase between December and January but overall his disease status appears to be stable.  Risks and benefits of continuing Trudi Ida was discussed today.  Alternative treatment options will be retreatment with systemic chemotherapy or possibly PARP inhibitors.  For the time being he is agreeable to continue with Xofigo to complete 6 months of therapy.  We will continue to monitor his outcome phosphatase as well as his PSA for the time being.   2. Androgen deprivation.  He will receive Eligard today and repeated in 4 months.  Complication occluding weight gain and osteoporosis were reviewed.  3. Bone directed therapy.  Currently on calcium and vitamin D supplements.  4. Anemia: Hemoglobin remains stable at this time does not require any transfusion or growth factor support.  His anemia is related to malignancy as well as previous chemotherapy exposure.  5. Bone pain: Very little noted at this time after radiation therapy and is to start [place].  He takes Advil at times.  6. IV access: Port-A-Cath will continue to be flushed periodically.  7.  Prognosis and goals of care: His disease is incurable although aggressive measures are warranted.  8. Follow up: In 2 months to repeat his evaluation and assess his status.  30 minutes was spent on this encounter.  Time was dedicated to review laboratory data, disease status update, discussing treatment options as well as addressing complications related to his cancer and cancer therapy.   Zola Button, MD 1/13/20212:50 PM

## 2019-09-02 ENCOUNTER — Other Ambulatory Visit: Payer: Self-pay | Admitting: Oncology

## 2019-09-05 ENCOUNTER — Telehealth: Payer: Self-pay | Admitting: *Deleted

## 2019-09-05 NOTE — Telephone Encounter (Signed)
CALLED PATIENT TO REMIND OF [place] INJECTION FOR 09-06-19 - ARRIVAL TIME- 11:15 AM @ [facility], SPOKE WITH PATIENT AND HE IS AWARE OF THIS INJECTION

## 2019-09-06 ENCOUNTER — Other Ambulatory Visit: Payer: Self-pay

## 2019-09-06 ENCOUNTER — Encounter (HOSPITAL_COMMUNITY)
Admission: RE | Admit: 2019-09-06 | Discharge: 2019-09-06 | Disposition: A | Discharge status: Home / Self Care | Payer: Medicare Other | Source: Ambulatory Visit | Attending: Radiation Oncology | Admitting: Radiation Oncology

## 2019-09-06 DIAGNOSIS — C61 Malignant neoplasm of prostate: Secondary | ICD-10-CM | POA: Diagnosis not present

## 2019-09-06 DIAGNOSIS — C7951 Secondary malignant neoplasm of bone: Secondary | ICD-10-CM

## 2019-09-06 MED ORDER — RADIUM RA 223 DICHLORIDE 30 MCCI/ML IV SOLN: 106.9000 | Freq: Once | INTRAVENOUS | Status: DC | PRN

## 2019-09-08 ENCOUNTER — Ambulatory Visit
Admission: RE | Admit: 2019-09-08 | Discharge: 2019-09-08 | Disposition: A | Discharge status: Home / Self Care | Payer: Medicare Other | Source: Ambulatory Visit | Attending: Urology | Admitting: Urology

## 2019-09-08 ENCOUNTER — Other Ambulatory Visit: Payer: Self-pay

## 2019-09-08 DIAGNOSIS — C7951 Secondary malignant neoplasm of bone: Secondary | ICD-10-CM

## 2019-09-08 DIAGNOSIS — C61 Malignant neoplasm of prostate: Secondary | ICD-10-CM

## 2019-09-08 NOTE — Addendum Note (Signed)
Encounter addended by: Freeman Caldron, PA-C on: 09/08/2019 2:14 PM  Actions taken: Level of Service modified

## 2019-09-08 NOTE — Addendum Note (Signed)
Encounter addended by: Freeman Caldron, PA-C on: 09/08/2019 10:06 AM  Actions taken: Clinical Note Signed

## 2019-09-08 NOTE — Progress Notes (Signed)
Radiation Oncology         [phone] ________________________________  Name: Alec Harris MRN: [id number]  Date: 09/08/2019  DOB: 02-29-1946  Post Treatment Note  CC: Patient, No Pcp Per  [name], MD  Diagnosis:   74 yo man with metastatic, castrate-resistant prostate cancer with diffuse bony disease and painful lesions in the right posterior ribs and sternum.  Interval Since Last Radiation:  4.5 weeks  07/25/19-08/08/19:   A field arrangement including the right ribs and sternum was treated to 30 Gy in 10 fractions of 3 Gy  06/16/2019 - 06/29/2019:   Right Femur / 30 Gy in 10 fractions  Narrative:  I spoke with the patient to conduct his routine scheduled 1 month follow up visit via telephone to spare the patient unnecessary potential exposure in the healthcare setting during the current COVID-19 pandemic.  The patient was notified in advance and gave permission to proceed with this visit format. He tolerated his recent palliative radiation treatment relatively well with no acute ill side effects noted.     In summary, he was initially diagnosed with prostate cancer with Dr. Lowella Bandy in 2003 and opted to undergo a radical retropubic prostatectomy with BPLND on 12/20/2001. Surgical pathology showed Gleason 4+4 disease with extracapsular extension to the right and left apex as well as the posterior lateral margins, bladder neck involvement, bilateral seminal vesicle involvement, and a positive lymph node (1/3). He was subsequently started on Lupron.  He unfortunately developed castration resistant disease with a rising PSA and was initially treated with prednisone and ketoconazole until 2013 when he developed progression with asymptomatic bone metastasis. He referred to Dr. [name] Blew and treated with Zytiga from 05/2012 to 05/2017. This was discontinued due to disease progression and he was switched to Taxotere from 05/2017 to 11/2017 (completed 10 cycles) with PSA decreased to 2.2 in  11/2017.  He went on observation only for a short time but his PSA increased 6.3 in 03/2018 so he was started on Xtandi from 03/2018 to 07/2018.  Unfortunately his PSA continued to rise despite Xtandi so this was discontinued and he was switched to [place] in 07/2018 to 01/2019. He had a good response to the Jevtana initially and completed 6 cycles on 01/2018.  He has been on a treatment break since that time and unfortunately, a follow-up PSA on 04/14/2019 was back up to 39.5.  Restaging CT A/P on 04/27/2019 showed widespread patchy sclerotic osseous metastases throughout the visualized skeleton but no lymphadenopathy or other findings of visceral metastatic disease.  A repeat bone scan on 05/05/2019 again confirmed diffuse severe multifocal areas of increased uptake consistent with metastatic disease with increased activity within a right upper posterior rib but improved previously-identified skull lesions.  His PSA has also continued to climb, from 20 on 03/08/2019 to 39.5 on 04/14/19 and up to 111 on 05/31/2019. At the time of his previous visit with [place] in 05/2019, he was complaining of severe pain in the right femur, progressive over the prior 6 weeks without known injury.  He had not had any falls around the house and had been using a cane and/or rolling walker to assist him with safe mobility. He denied associated paresthesias or focal weakness in the lower extremities but reported that the pain would occasionally wake him from sleep at night.  He denied any back pain or bowel or bladder dysfunction and denied any other sites of focal, specific bone pain.  He elected to proceed with palliative radiotherapy  to the right femur which was completed 06/29/19 and he reported significant improvement in his pain. He also agreed to proceed with Xofigo infusions for management of the diffuse bony disease but this was delayed in getting started due to a delay in insurance approval but was ultimately approved and started on  08/02/19.  More recently, he presented to the ED on 07/09/19 with complaints of increasing bone pain in the sternum and posterior right ribs, associated with known sites of metastatic disease.  He had been taking Vicodin for his pain which was becoming less effective. He was referred back to our clinic to discuss palliative radiotherapy to the new painful sites of disease and elected to proceed with palliative radiotherapy to the right ribs and sternum, completed 08/08/19.                           On review of systems, the patient states that he is doing very well overall.  He tolerated the recent course of radiotherapy very well and has had significant improvement in his pain since completing. At this point, he is only using Advil on occasion prn. He is also receiving Xofigo infusions monthly, and tolerating these well with only mild diarrhea and gas for 24 hours after infusions. He had infusion 2 of 6 on 09/06/19. His most recent PSA on 08/29/19 was slightly higher, at 245 as compared to 221 on 07/19/19 but is rising at a much slower rate than prior to XRT and Xofigo.  The current plan is to continue with Eligard every 4 months and complete the planned 6 or 6 Xofigo infusions.   ALLERGIES:  has No Known Allergies.  Meds: Current Outpatient Medications  Medication Sig Dispense Refill  . amLODipine (NORVASC) 5 MG tablet TAKE 1 TABLET (5 MG TOTAL) BY MOUTH DAILY. 90 tablet 2  . aspirin EC 81 MG tablet Take 81 mg by mouth daily.    [name] atorvastatin (LIPITOR) 20 MG tablet TAKE 1 TABLET BY MOUTH EVERY DAY 90 tablet 2  . Calcium Carb-Cholecalciferol (CALCIUM 500 +D) 500-400 MG-UNIT TABS Take 1 tablet by mouth daily.     . cilostazol (PLETAL) 100 MG tablet TAKE 1 TABLET BY MOUTH TWICE A DAY 180 tablet 2  . HYDROcodone-acetaminophen (NORCO/VICODIN) 5-325 MG tablet Take 1 tablet by mouth every 8 (eight) hours as needed for moderate pain. 30 tablet 0  . lidocaine-prilocaine (EMLA) cream Apply 1 application  topically as needed. 30 g 0  . megestrol (MEGACE) 40 MG/ML suspension TAKE 10 MLS (400 MG TOTAL) BY MOUTH DAILY. 240 mL 0  . prochlorperazine (COMPAZINE) 10 MG tablet Take 1 tablet (10 mg total) by mouth every 6 (six) hours as needed for nausea or vomiting. (Patient not taking: Reported on 06/10/2019) 30 tablet 0  . traMADol (ULTRAM) 50 MG tablet Take 1 tablet (50 mg total) by mouth every 6 (six) hours as needed. (Patient not taking: Reported on 07/19/2019) 30 tablet 0   No current facility-administered medications for this visit.   Facility-Administered Medications Ordered in Other Visits  Medication Dose Route Frequency Provider Last Rate Last Admin  . Radium Ra 223 Dichloride (XOFIGO) SOLN 106.9 micro curie  106.9 micro curie Intravenous Once PRN Tyler Pita, MD        Physical Findings:  vitals were not taken for this visit.   Karen Kays to assess due to telephone follow-up visit format.  Lab Findings: Lab Results  Component Value Date   WBC  4.2 08/30/2019   HGB 10.8 (L) 08/30/2019   HCT 33.7 (L) 08/30/2019   MCV 94.4 08/30/2019   PLT 143 (L) 08/30/2019     Radiographic Findings: NM XOFIGO INJECTION  Result Date: 09/06/2019  Trudi Ida was injected intravenously in Nuclear Medicine under the supervision of the attending radiologist    Impression/Plan: 21. 74 yo man with metastatic, castrate-resistant prostate cancer with diffuse bony disease and painful lesions in the right posterior ribs and sternum. He will continue to follow up with Dr. [name] Blew in medical oncology for continued management of his systemic disease. His most recent PSA on 08/29/19 was slightly higher, at 245 as compared to 221 on 07/19/19 but is rising at a much slower rate than prior to XRT and Xofigo.  The current plan is to continue with Eligard every 4 months and complete the planned 6 or 6 Xofigo infusions.  He had infusion 2 of 6 on 09/06/19 and continues to tolerate this well- next infusion in 09/2019.  His most  recent Eligard injection was received on 08/31/19 and his next scheduled follow up with Dr. [name] Blew is 10/20/19.  We discussed that we will continue to see him back on a monthly basis for his Xofigo infusions with labs 1 week prior but that he is welcome to call at anytime in the interim with any questions or concerns related to his previous radiotherapy.  He was comfortable with this plan and will follow up as needed.    Nicholos Johns, PA-C

## 2019-09-09 ENCOUNTER — Encounter: Payer: Self-pay | Admitting: Cardiovascular Disease

## 2019-09-09 ENCOUNTER — Ambulatory Visit (INDEPENDENT_AMBULATORY_CARE_PROVIDER_SITE_OTHER): Payer: Medicare Other | Admitting: Cardiovascular Disease

## 2019-09-09 ENCOUNTER — Other Ambulatory Visit: Payer: Self-pay

## 2019-09-09 VITALS — BP 142/71 | HR 88 | Temp 97.3°F | Ht 69.0 in | Wt 163.4 lb

## 2019-09-09 DIAGNOSIS — E782 Mixed hyperlipidemia: Secondary | ICD-10-CM | POA: Diagnosis not present

## 2019-09-09 DIAGNOSIS — I6529 Occlusion and stenosis of unspecified carotid artery: Secondary | ICD-10-CM

## 2019-09-09 DIAGNOSIS — R011 Cardiac murmur, unspecified: Secondary | ICD-10-CM

## 2019-09-09 DIAGNOSIS — R0989 Other specified symptoms and signs involving the circulatory and respiratory systems: Secondary | ICD-10-CM | POA: Diagnosis not present

## 2019-09-09 DIAGNOSIS — I739 Peripheral vascular disease, unspecified: Secondary | ICD-10-CM | POA: Diagnosis not present

## 2019-09-09 NOTE — Assessment & Plan Note (Signed)
[name] returns today for follow-up.  He has had right carotid endarterectomy performed by Dr. Trula Slade electively 04/08/2012.  He had left carotid endarterectomy performed 07/05/2013.  His last carotid Dopplers were over 2 years ago.  He is bilateral carotid bruits.  We will repeat carotid Doppler studies.

## 2019-09-09 NOTE — Assessment & Plan Note (Signed)
History of lifestyle limiting claudication with known occluded SFAs bilaterally by angiography I performed 02/21/2012.  He has high-grade iliac disease by duplex ultrasound.  He was getting clinical benefit with Pletal but this no longer is the case.  He has lifestyle limiting claudication and wishes to proceed with angiography.  We will get repeat lower extremity arterial Doppler studies to further evaluate.  I will see him back in 2 months at which time we can proceed with angiography.

## 2019-09-09 NOTE — Patient Instructions (Signed)
Medication Instructions:  Your physician recommends that you continue on your current medications as directed. Please refer to the Current Medication list given to you today.  If you need a refill on your cardiac medications before your next appointment, please call your pharmacy.   Lab work: NONE  Testing/Procedures: Your physician has requested that you have an echocardiogram. Echocardiography is a painless test that uses sound waves to create images of your heart. It provides your doctor with information about the size and shape of your heart and how well your heart's chambers and valves are working. This procedure takes approximately one hour. There are no restrictions for this procedure. [place] has requested that you have a carotid duplex. This test is an ultrasound of the carotid arteries in your neck. It looks at blood flow through these arteries that supply the brain with blood. Allow one hour for this exam. There are no restrictions or special instructions.  AND  Your physician has requested that you have a lower extremity arterial exercise duplex. During this test, exercise and ultrasound are used to evaluate arterial blood flow in the legs. Allow one hour for this exam. There are no restrictions or special instructions.  AND  Your physician has requested that you have a renal artery duplex. During this test, an ultrasound is used to evaluate blood flow to the kidneys. Allow one hour for this exam. Do not eat after midnight the day before and avoid carbonated beverages. Take your medications as you usually do.  AND  Your physician has requested that you have an ankle brachial index (ABI). During this test an ultrasound and blood pressure cuff are used to evaluate the arteries that supply the arms and legs with blood. Allow thirty minutes for this exam. There are no restrictions or special instructions.  Follow-Up: At [facility],  you and your health needs are our priority.  As part of our continuing mission to provide you with exceptional heart care, we have created designated Provider Care Teams.  These Care Teams include your primary Cardiologist (physician) and Advanced Practice Providers (APPs -  Physician Assistants and Nurse Practitioners) who all work together to provide you with the care you need, when you need it. You may see Dr. Gwenlyn Found or one of the following Advanced Practice Providers on your designated Care Team:    Kerin Ransom, PA-C  [place], [place]  Coletta Memos, [place]  Your physician wants you to follow-up in: 2 months with Dr. Gwenlyn Found to discuss possible angiography

## 2019-09-09 NOTE — Progress Notes (Signed)
09/09/2019 Alec Harris   [birth year]  [id number]  Primary Physician Patient, No Pcp Per Primary Cardiologist: Lorretta Harp MD FACP, [place], Alec Harris, [place]  HPI:  Alec Harris is a 74 y.o.  mildly overweight married African American male father of 2, grandfather to 2 grandchildren who I last saw in the office  04/15/2019.Alec Harris He was referred to me by Dr. Debara Pickett for PV evaluation. He has severe diffuse vascular disease involving his iliacs, SFAs and carotids. His claudication improved with Pletal. His other problems include hyperlipidemia. He had a negative Myoview Jan 14, 2012. He does continue to smoke. He stopped his Crestor on his own and did have an elevated lipid profile in the past with a total cholesterol of 255, LDL of 169 and HDL of 56. I angiogrammed him on March 02, 2012 revealing total SFAs bilaterally with 3-vessel runoff below the knee, high-grade calcified iliac disease, and bilateral carotid artery stenosis, 95% right and 80% left with a type 2 arch. He underwent elective right carotid endarterectomy by Dr. Annamarie Major on April 08, 2012.over the last 6 months he denies chest pain shortness of breath or claudication. The Pletal has been beneficial. Unfortunately he did not obtain his carotid artery Doppler studies as originally intended. He had followup carotid Dopplers performed in our office on 01/18/13 which revealed a widely patent right carotid endarterectomy site, and progression of disease on the left which had already been documented angiographically to be in the 80% range. He was neurologically asymptomatic. He ultimately underwent elective left internal carotid artery stenting by myself and Dr. Trula Slade 07/05/13. He denies chest pain or shortness of breath but does complain of lifestyle limiting claudication. Lower cineangiography performed 03/02/12 by myself revealed occluded SFAs bilaterally with high-grade calcified right external and left common iliac artery stenosis. His last  Dopplers performed 09/28/14 revealed ABIs in the midpoint 6 range bilaterally with occluded SFAs and high-grade iliacs bilaterally.   Since I saw him in the office 5 months ago continues to do well.  He has now experienced lifestyle limiting claudication which in the past has been improved with Pletal for this no longer seems to be the case.  He does continue to smoke a pack of cigarettes a week.  We will recheck lower extremity arterial Doppler studies now see him back in 2 months for follow-up.   Current Meds  Medication Sig   amLODipine (NORVASC) 5 MG tablet TAKE 1 TABLET (5 MG TOTAL) BY MOUTH DAILY.   aspirin EC 81 MG tablet Take 81 mg by mouth daily.   atorvastatin (LIPITOR) 20 MG tablet TAKE 1 TABLET BY MOUTH EVERY DAY   Calcium Carb-Cholecalciferol (CALCIUM 500 +D) 500-400 MG-UNIT TABS Take 1 tablet by mouth daily.    cilostazol (PLETAL) 100 MG tablet TAKE 1 TABLET BY MOUTH TWICE A DAY   HYDROcodone-acetaminophen (NORCO/VICODIN) 5-325 MG tablet Take 1 tablet by mouth every 8 (eight) hours as needed for moderate pain.   lidocaine-prilocaine (EMLA) cream Apply 1 application topically as needed.   megestrol (MEGACE) 40 MG/ML suspension TAKE 10 MLS (400 MG TOTAL) BY MOUTH DAILY.   traMADol (ULTRAM) 50 MG tablet Take 1 tablet (50 mg total) by mouth every 6 (six) hours as needed.   [DISCONTINUED] prochlorperazine (COMPAZINE) 10 MG tablet Take 1 tablet (10 mg total) by mouth every 6 (six) hours as needed for nausea or vomiting.     No Known Allergies  Social History   Socioeconomic History   Marital status: Married  Spouse name: Not on file   Number of children: Not on file   Years of education: Not on file   Highest education level: Not on file  Occupational History   Not on file  Tobacco Use   Smoking status: Current Every Day Smoker    Years: 50.00    Types: Cigarettes   Smokeless tobacco: Never Used   Tobacco comment: pt states that he is going to quit  completely; 1/2 pack per day  Substance and Sexual Activity   Alcohol use: Not Currently   Drug use: No   Sexual activity: Not Currently    Birth control/protection: None  Other Topics Concern   Not on file  Social History Narrative   Not on file   Social Determinants of Health   Financial Resource Strain:    Difficulty of Paying Living Expenses: Not on file  Food Insecurity:    Worried About Running Out of Food in the Last Year: Not on file   Ran Out of Food in the Last Year: Not on file  Transportation Needs:    Lack of Transportation (Medical): Not on file   Lack of Transportation (Non-Medical): Not on file  Physical Activity:    Days of Exercise per Week: Not on file   Minutes of Exercise per Session: Not on file  Stress:    Feeling of Stress : Not on file  Social Connections:    Frequency of Communication with Friends and Family: Not on file   Frequency of Social Gatherings with Friends and Family: Not on file   Attends Religious Services: Not on file   Active Member of Clubs or Organizations: Not on file   Attends Archivist Meetings: Not on file   Marital Status: Not on file  Intimate Partner Violence:    Fear of Current or Ex-Partner: Not on file   Emotionally Abused: Not on file   Physically Abused: Not on file   Sexually Abused: Not on file     Review of Systems: General: negative for chills, fever, night sweats or weight changes.  Cardiovascular: negative for chest pain, dyspnea on exertion, edema, orthopnea, palpitations, paroxysmal nocturnal dyspnea or shortness of breath Dermatological: negative for rash Respiratory: negative for cough or wheezing Urologic: negative for hematuria Abdominal: negative for nausea, vomiting, diarrhea, bright red blood per rectum, melena, or hematemesis Neurologic: negative for visual changes, syncope, or dizziness All other systems reviewed and are otherwise negative except as noted  above.    Blood pressure (!) 142/71, pulse 88, temperature (!) 97.3 F (36.3 C), height 5\' 9"  (1.753 m), weight 163 lb 6.4 oz (74.1 kg), SpO2 97 %.  General appearance: alert and no distress Neck: no adenopathy, no JVD, supple, symmetrical, trachea midline, thyroid not enlarged, symmetric, no tenderness/mass/nodules and Bilateral carotid bruits Lungs: clear to auscultation bilaterally Heart: Soft outflow tract murmur Extremities: extremities normal, atraumatic, no cyanosis or edema Pulses: Diminished pedal pulses Skin: Skin color, texture, turgor normal. No rashes or lesions Neurologic: Alert and oriented X 3, normal strength and tone. Normal symmetric reflexes. Normal coordination and gait  EKG not performed today  ASSESSMENT AND PLAN:   Occlusion and stenosis of carotid artery without mention of cerebral infarction Mr. Linton Rump returns today for follow-up.  He has had right carotid endarterectomy performed by Dr. Trula Slade electively 04/08/2012.  He had left carotid endarterectomy performed 07/05/2013.  His last carotid Dopplers were over 2 years ago.  He is bilateral carotid bruits.  We will repeat  carotid Doppler studies.  PVD (peripheral vascular disease) with claudication History of lifestyle limiting claudication with known occluded SFAs bilaterally by angiography I performed 02/21/2012.  He has high-grade iliac disease by duplex ultrasound.  He was getting clinical benefit with Pletal but this no longer is the case.  He has lifestyle limiting claudication and wishes to proceed with angiography.  We will get repeat lower extremity arterial Doppler studies to further evaluate.  I will see him back in 2 months at which time we can proceed with angiography.  Hyperlipemia History of hyperlipidemia on statin therapy with lipid profile performed 04/07/2019 revealing total cholesterol 137, LDL of 71 and HDL 54.      Lorretta Harp MD [facility], [facility] 09/09/2019 1:56 PM

## 2019-09-09 NOTE — Assessment & Plan Note (Signed)
History of hyperlipidemia on statin therapy with lipid profile performed 04/07/2019 revealing total cholesterol 137, LDL of 71 and HDL 54.

## 2019-09-09 NOTE — Progress Notes (Signed)
  Radiation Oncology         (336) [phone] ________________________________  Name: Alec Harris MRN: [id number]  Date: 09/06/2019  DOB: 06/12/1946  Radium-223 Infusion Note  Diagnosis:  Castration resistant prostate cancer with painful bone involvement  Current Infusion:    2  Planned Infusions:  6  Narrative: [name] presented to nuclear medicine for treatment. His most recent blood counts were reviewed.  He remains a good candidate to proceed with Ra-223.  The patient was situated in an infusion suite with a contact barrier placed under his arm. Intravenous access was established, using sterile technique, and a normal saline infusion from a syringe was started.  Micro-dosimetry:  The prescribed radiation activity was assayed and confirmed to be within specified tolerance.  Special Treatment Procedure - Infusion:  The nuclear medicine technologist and I personally verified the dose activity to be delivered as specified in the written directive, and verified the patient identification via 2 separate methods.  The syringe containing the dose was attached to an intravenous access and the dose delivered over a minute. No complications were noted.  The total administered dose was 111.5 microcuries.   A saline flush of the line and the syringe that contained the isotope was then performed.  The residual radioactivity in the syringe was 4.6 microcuries, so the actual infused isotope activity was 106.9 microcuries.   Pressure was applied to the venipuncture site, and a compression bandage placed.   Radiation Safety personnel were present to perform the discharge survey, as detailed on their documentation.   After a short period of observation, the patient had his IV removed.  Impression:  The patient tolerated his infusion relatively well.  Plan:  The patient will return in one month for ongoing care.    ________________________________  Sheral Apley. Tammi Klippel, M.D.   This document serves as  a record of services personally performed by Tyler Pita, MD. It was created on his behalf by Wilburn Mylar, a trained medical scribe. The creation of this record is based on the scribe's personal observations and the provider's statements to them. This document has been checked and approved by the attending provider.

## 2019-09-16 ENCOUNTER — Other Ambulatory Visit (HOSPITAL_COMMUNITY): Payer: Self-pay | Admitting: Radiation Oncology

## 2019-09-16 ENCOUNTER — Telehealth: Payer: Self-pay | Admitting: *Deleted

## 2019-09-16 DIAGNOSIS — C61 Malignant neoplasm of prostate: Secondary | ICD-10-CM

## 2019-09-16 NOTE — Telephone Encounter (Signed)
CALLED PATIENT TO INFORM OF LAB AND WEIGHT ON 09-27-19 @ 12 PM @ [facility]. ON 10-04-19 - ARRIVAL TIME- 12:45 PM @ [facility], SPOKE WITH PATIENT AND HE IS AWARE OF THESE APPTS.

## 2019-09-20 ENCOUNTER — Other Ambulatory Visit: Payer: Self-pay

## 2019-09-20 ENCOUNTER — Ambulatory Visit (HOSPITAL_COMMUNITY): Payer: Medicare Other | Attending: Cardiovascular Disease

## 2019-09-20 DIAGNOSIS — E782 Mixed hyperlipidemia: Secondary | ICD-10-CM | POA: Insufficient documentation

## 2019-09-20 DIAGNOSIS — I739 Peripheral vascular disease, unspecified: Secondary | ICD-10-CM | POA: Diagnosis not present

## 2019-09-20 DIAGNOSIS — R0989 Other specified symptoms and signs involving the circulatory and respiratory systems: Secondary | ICD-10-CM | POA: Insufficient documentation

## 2019-09-20 DIAGNOSIS — I6529 Occlusion and stenosis of unspecified carotid artery: Secondary | ICD-10-CM | POA: Insufficient documentation

## 2019-09-20 DIAGNOSIS — R011 Cardiac murmur, unspecified: Secondary | ICD-10-CM | POA: Insufficient documentation

## 2019-09-21 NOTE — Telephone Encounter (Signed)
Called pt to let him know that Dr. Gwenlyn Found can no proceed with doing PV procedures. He needs aortic/iliac dopplers and to come back to the office prior to scheduling procedures. Tried calling both numbers, the 272# is not in service and the 965# does not have VM set up yet. Will try again later.

## 2019-09-26 ENCOUNTER — Telehealth: Payer: Self-pay | Admitting: *Deleted

## 2019-09-26 ENCOUNTER — Other Ambulatory Visit: Payer: Self-pay | Admitting: Radiation Oncology

## 2019-09-26 DIAGNOSIS — C7951 Secondary malignant neoplasm of bone: Secondary | ICD-10-CM

## 2019-09-26 NOTE — Telephone Encounter (Signed)
CALLED PATIENT TO REMIND OF LAB AND WEIGHT FOR 09-27-19 , SPOKE WITH PATIENT AND HE IS AWARE OF THIS APPT.

## 2019-09-27 ENCOUNTER — Other Ambulatory Visit: Payer: Self-pay

## 2019-09-27 ENCOUNTER — Ambulatory Visit
Admission: RE | Admit: 2019-09-27 | Discharge: 2019-09-27 | Disposition: A | Discharge status: Home / Self Care | Payer: Medicare Other | Source: Ambulatory Visit | Attending: Radiation Oncology | Admitting: Radiation Oncology

## 2019-09-27 DIAGNOSIS — Z79818 Long term (current) use of other agents affecting estrogen receptors and estrogen levels: Secondary | ICD-10-CM | POA: Insufficient documentation

## 2019-09-27 DIAGNOSIS — Z923 Personal history of irradiation: Secondary | ICD-10-CM | POA: Insufficient documentation

## 2019-09-27 DIAGNOSIS — C61 Malignant neoplasm of prostate: Secondary | ICD-10-CM | POA: Diagnosis not present

## 2019-09-27 DIAGNOSIS — C7951 Secondary malignant neoplasm of bone: Secondary | ICD-10-CM | POA: Insufficient documentation

## 2019-09-27 LAB — CBC WITH DIFFERENTIAL (CANCER CENTER ONLY)
Abs Immature Granulocytes: 0.04 10*3/uL (ref 0.00–0.07)
Basophils Absolute: 0 10*3/uL (ref 0.0–0.1)
Basophils Relative: 0 %
Eosinophils Absolute: 0.1 10*3/uL (ref 0.0–0.5)
Eosinophils Relative: 1 %
HCT: 35.6 % — ABNORMAL LOW (ref 39.0–52.0)
Hemoglobin: 11.4 g/dL — ABNORMAL LOW (ref 13.0–17.0)
Immature Granulocytes: 1 %
Lymphocytes Relative: 32 %
Lymphs Abs: 1.6 10*3/uL (ref 0.7–4.0)
MCH: 31 pg (ref 26.0–34.0)
MCHC: 32 g/dL (ref 30.0–36.0)
MCV: 96.7 fL (ref 80.0–100.0)
Monocytes Absolute: 0.7 10*3/uL (ref 0.1–1.0)
Monocytes Relative: 13 %
Neutro Abs: 2.6 10*3/uL (ref 1.7–7.7)
Neutrophils Relative %: 53 %
Platelet Count: 127 10*3/uL — ABNORMAL LOW (ref 150–400)
RBC: 3.68 MIL/uL — ABNORMAL LOW (ref 4.22–5.81)
RDW: 18.4 % — ABNORMAL HIGH (ref 11.5–15.5)
WBC Count: 5 10*3/uL (ref 4.0–10.5)
nRBC: 0 % (ref 0.0–0.2)

## 2019-09-28 ENCOUNTER — Other Ambulatory Visit: Payer: Self-pay | Admitting: Oncology

## 2019-10-02 ENCOUNTER — Ambulatory Visit: Payer: Medicare Other

## 2019-10-03 ENCOUNTER — Telehealth: Payer: Self-pay | Admitting: *Deleted

## 2019-10-03 NOTE — Telephone Encounter (Signed)
CALLED PATIENT TO REMIND OF XOFIGO INJ. FOR 10-04-19 - ARRIVAL TIME- 12:45 PM @ [facility], SPOKE WITH PATIENT AND HE IS AWARE OF THIS INJ.

## 2019-10-04 ENCOUNTER — Encounter (HOSPITAL_COMMUNITY): Admission: RE | Admit: 2019-10-04 | Payer: Medicare Other | Source: Ambulatory Visit

## 2019-10-05 ENCOUNTER — Telehealth: Payer: Self-pay | Admitting: *Deleted

## 2019-10-05 NOTE — Telephone Encounter (Signed)
CALLED PATIENT TO INFORM OF XOFIGO INJ. BEING MOVED TO 10-11-19 - ARRIVAL TIME- 11:15 AM @ [facility], SPOKE WITH PATIENT AND HE IS AWARE OF THIS APPT.

## 2019-10-10 ENCOUNTER — Telehealth: Payer: Self-pay | Admitting: *Deleted

## 2019-10-10 NOTE — Telephone Encounter (Signed)
CALLED PATIENT TO REMIND OF XOFIGO INJ. FOR 10-11-19 - ARRIVAL TIME- 11:15 AM @ [facility], SPOKE WITH PATIENT AND HE IS AWARE OF THIS INJECTION

## 2019-10-11 ENCOUNTER — Other Ambulatory Visit: Payer: Self-pay

## 2019-10-11 ENCOUNTER — Ambulatory Visit (HOSPITAL_COMMUNITY)
Admission: RE | Admit: 2019-10-11 | Discharge: 2019-10-11 | Disposition: A | Discharge status: Home / Self Care | Payer: Medicare Other | Source: Ambulatory Visit | Attending: Radiation Oncology | Admitting: Radiation Oncology

## 2019-10-11 DIAGNOSIS — C61 Malignant neoplasm of prostate: Secondary | ICD-10-CM | POA: Insufficient documentation

## 2019-10-11 DIAGNOSIS — C7951 Secondary malignant neoplasm of bone: Secondary | ICD-10-CM | POA: Insufficient documentation

## 2019-10-11 MED ORDER — RADIUM RA 223 DICHLORIDE 30 MCCI/ML IV SOLN
104.3000 | Freq: Once | INTRAVENOUS | Status: AC | PRN
  Administered 2019-10-11: 104.3 via INTRAVENOUS

## 2019-10-11 NOTE — Progress Notes (Signed)
  Radiation Oncology         [phone] ________________________________  Name: Alec Harris MRN: [id number]  Date: 10/11/2019  DOB: 10-21-1945  Radium-223 Infusion Note  Diagnosis:  Castration resistant prostate cancer with painful bone involvement  Current Infusion:    3  Planned Infusions:  6  Narrative: [name] presented to nuclear medicine for treatment. His most recent blood counts were reviewed.  He remains a good candidate to proceed with Ra-223.  The patient was situated in an infusion suite with a contact barrier placed under his arm. Intravenous access was established, using sterile technique, and a normal saline infusion from a syringe was started.  Micro-dosimetry:  The prescribed radiation activity was assayed and confirmed to be within specified tolerance.  Special Treatment Procedure - Infusion:  The nuclear medicine technologist and I personally verified the dose activity to be delivered as specified in the written directive, and verified the patient identification via 2 separate methods.  The syringe containing the dose was attached to an intravenous access and the dose delivered over a minute. No complications were noted.  The total administered dose was 108.2 microcuries.   A saline flush of the line and the syringe that contained the isotope was then performed.  The residual radioactivity in the syringe was 3.9 microcuries, so the actual infused isotope activity was 103.8 microcuries.   Pressure was applied to the venipuncture site, and a compression bandage placed.   Radiation Safety personnel were present to perform the discharge survey, as detailed on their documentation.   After a short period of observation, the patient had his IV removed.  Impression:  The patient tolerated his infusion relatively well.  Plan:  The patient will return in one month for ongoing care.    ________________________________  Sheral Apley. Tammi Klippel, M.D.  This document serves as a  record of services personally performed by Tyler Pita, MD. It was created on his behalf by Wilburn Mylar, a trained medical scribe. The creation of this record is based on the scribe's personal observations and the provider's statements to them. This document has been checked and approved by the attending provider.

## 2019-10-18 ENCOUNTER — Inpatient Hospital Stay: Payer: Medicare Other | Attending: Oncology

## 2019-10-18 ENCOUNTER — Other Ambulatory Visit: Payer: Self-pay

## 2019-10-18 ENCOUNTER — Inpatient Hospital Stay: Payer: Medicare Other

## 2019-10-18 DIAGNOSIS — I739 Peripheral vascular disease, unspecified: Secondary | ICD-10-CM | POA: Insufficient documentation

## 2019-10-18 DIAGNOSIS — C7951 Secondary malignant neoplasm of bone: Secondary | ICD-10-CM | POA: Diagnosis not present

## 2019-10-18 DIAGNOSIS — Z95828 Presence of other vascular implants and grafts: Secondary | ICD-10-CM

## 2019-10-18 DIAGNOSIS — C61 Malignant neoplasm of prostate: Secondary | ICD-10-CM

## 2019-10-18 DIAGNOSIS — G8929 Other chronic pain: Secondary | ICD-10-CM | POA: Diagnosis not present

## 2019-10-18 DIAGNOSIS — Z923 Personal history of irradiation: Secondary | ICD-10-CM | POA: Diagnosis not present

## 2019-10-18 DIAGNOSIS — D6959 Other secondary thrombocytopenia: Secondary | ICD-10-CM | POA: Diagnosis not present

## 2019-10-18 DIAGNOSIS — Z79818 Long term (current) use of other agents affecting estrogen receptors and estrogen levels: Secondary | ICD-10-CM | POA: Insufficient documentation

## 2019-10-18 DIAGNOSIS — Z9079 Acquired absence of other genital organ(s): Secondary | ICD-10-CM | POA: Diagnosis not present

## 2019-10-18 DIAGNOSIS — Z7982 Long term (current) use of aspirin: Secondary | ICD-10-CM | POA: Insufficient documentation

## 2019-10-18 DIAGNOSIS — Z79899 Other long term (current) drug therapy: Secondary | ICD-10-CM | POA: Diagnosis not present

## 2019-10-18 DIAGNOSIS — Z9221 Personal history of antineoplastic chemotherapy: Secondary | ICD-10-CM | POA: Insufficient documentation

## 2019-10-18 LAB — CBC WITH DIFFERENTIAL (CANCER CENTER ONLY)
Abs Immature Granulocytes: 0.03 10*3/uL (ref 0.00–0.07)
Basophils Absolute: 0 10*3/uL (ref 0.0–0.1)
Basophils Relative: 0 %
Eosinophils Absolute: 0.1 10*3/uL (ref 0.0–0.5)
Eosinophils Relative: 1 %
HCT: 34.7 % — ABNORMAL LOW (ref 39.0–52.0)
Hemoglobin: 11.2 g/dL — ABNORMAL LOW (ref 13.0–17.0)
Immature Granulocytes: 1 %
Lymphocytes Relative: 21 %
Lymphs Abs: 1 10*3/uL (ref 0.7–4.0)
MCH: 31.1 pg (ref 26.0–34.0)
MCHC: 32.3 g/dL (ref 30.0–36.0)
MCV: 96.4 fL (ref 80.0–100.0)
Monocytes Absolute: 0.5 10*3/uL (ref 0.1–1.0)
Monocytes Relative: 10 %
Neutro Abs: 3.2 10*3/uL (ref 1.7–7.7)
Neutrophils Relative %: 67 %
Platelet Count: 68 10*3/uL — ABNORMAL LOW (ref 150–400)
RBC: 3.6 MIL/uL — ABNORMAL LOW (ref 4.22–5.81)
RDW: 17.6 % — ABNORMAL HIGH (ref 11.5–15.5)
WBC Count: 4.8 10*3/uL (ref 4.0–10.5)
nRBC: 0 % (ref 0.0–0.2)

## 2019-10-18 LAB — CMP (CANCER CENTER ONLY)
ALT: 9 U/L (ref 0–44)
AST: 11 U/L — ABNORMAL LOW (ref 15–41)
Albumin: 3.2 g/dL — ABNORMAL LOW (ref 3.5–5.0)
Alkaline Phosphatase: 1265 U/L — ABNORMAL HIGH (ref 38–126)
Anion gap: 10 (ref 5–15)
BUN: 16 mg/dL (ref 8–23)
CO2: 20 mmol/L — ABNORMAL LOW (ref 22–32)
Calcium: 8.3 mg/dL — ABNORMAL LOW (ref 8.9–10.3)
Chloride: 111 mmol/L (ref 98–111)
Creatinine: 0.82 mg/dL (ref 0.61–1.24)
GFR, Est AFR Am: >60 mL/min (ref >60)
GFR, Estimated: >60 mL/min (ref >60)
Glucose, Bld: 113 mg/dL — ABNORMAL HIGH (ref 70–99)
Potassium: 3.8 mmol/L (ref 3.5–5.1)
Sodium: 141 mmol/L (ref 135–145)
Total Bilirubin: 0.6 mg/dL (ref 0.3–1.2)
Total Protein: 6.6 g/dL (ref 6.5–8.1)

## 2019-10-18 MED ORDER — SODIUM CHLORIDE 0.9% FLUSH
10.0000 mL | INTRAVENOUS | Status: DC | PRN
  Administered 2019-10-18: 10 mL via INTRAVENOUS
  Filled 2019-10-18: qty 10

## 2019-10-18 MED ORDER — HEPARIN SOD (PORK) LOCK FLUSH 100 UNIT/ML IV SOLN
500.0000 [IU] | Freq: Once | INTRAVENOUS | Status: AC | PRN
  Administered 2019-10-18: 500 [IU] via INTRAVENOUS
  Filled 2019-10-18: qty 5

## 2019-10-18 NOTE — Patient Instructions (Signed)

## 2019-10-19 LAB — PROSTATE-SPECIFIC AG, SERUM (LABCORP): Prostate Specific Ag, Serum: 292 ng/mL — ABNORMAL HIGH (ref 0.0–4.0)

## 2019-10-20 ENCOUNTER — Other Ambulatory Visit: Payer: Self-pay

## 2019-10-20 ENCOUNTER — Inpatient Hospital Stay (HOSPITAL_BASED_OUTPATIENT_CLINIC_OR_DEPARTMENT_OTHER): Payer: Medicare Other | Admitting: Oncology

## 2019-10-20 VITALS — BP 126/70 | HR 101 | Temp 98.7°F | Resp 20 | Ht 69.0 in | Wt 168.3 lb

## 2019-10-20 DIAGNOSIS — Z9221 Personal history of antineoplastic chemotherapy: Secondary | ICD-10-CM | POA: Diagnosis not present

## 2019-10-20 DIAGNOSIS — I6529 Occlusion and stenosis of unspecified carotid artery: Secondary | ICD-10-CM | POA: Diagnosis not present

## 2019-10-20 DIAGNOSIS — C7951 Secondary malignant neoplasm of bone: Secondary | ICD-10-CM | POA: Diagnosis not present

## 2019-10-20 DIAGNOSIS — C61 Malignant neoplasm of prostate: Secondary | ICD-10-CM | POA: Diagnosis not present

## 2019-10-20 DIAGNOSIS — Z923 Personal history of irradiation: Secondary | ICD-10-CM | POA: Diagnosis not present

## 2019-10-20 DIAGNOSIS — Z79818 Long term (current) use of other agents affecting estrogen receptors and estrogen levels: Secondary | ICD-10-CM | POA: Diagnosis not present

## 2019-10-20 DIAGNOSIS — Z9079 Acquired absence of other genital organ(s): Secondary | ICD-10-CM | POA: Diagnosis not present

## 2019-10-20 NOTE — Progress Notes (Signed)
Hematology and Oncology Follow Up Visit  Alec Aurigemma [id number] June 18, 1946 74 y.o. 10/20/2019 1:01 PM Patient, No Pcp PerShadad, [name], MD   Principle Diagnosis: 20 year old man with advanced prostate cancer with disease to the bone since 2013.  He presented with Gleason score of 4+4 = 8 and localized disease initially.  Prior Therapy:  He underwent a prostatectomy with a pathology that showed prostatic adenocarcinoma. Gleason score 4+4 equals 8, with the tumor involving the margins and the pathologic staging was pT4 N1 with 1 of 2 left pelvic lymph nodes involved.   He was started on adjuvant hormone therapy with Lupron. He developed a rising PSA and bone metastasis.   He was then treated with ketoconazole and prednisone. He developed a rising PSA with a doubling time of just over 6 months. Bone scan in September 2013 showed increased uptake in the right lower lumbar spine at L4 and L5 and abnormal uptake in the mid sternum which had progressed since the prior bone scan.  Zytiga 1000 mg daily started in October 2013.  Discontinued in October 2018 after progression of disease.  Taxotere chemotherapy at 75 mg per meter square started on 06/02/2017.  The dose was reduced to 60 mg/m starting with cycle 3.  He completed 10 cycles of therapy and April 2019.   Xtandi 160 mg daily started in August 2019.  Therapy discontinued in December 2019 after progression of disease.  Jevtana chemotherapy started on August 04, 2018 and receiving 20 mg/m.  He completed 6 cycles of therapy in June 2020.  He is status post palliative radiation therapy to the ribs and sternum completed in December 2020.  Received 30 Gray in 10 fractions between December 7 and December 21.  Current therapy:   Lupron 30 mg every 4 months.  He received Eligard on January 13 and will be repeated in May.  Xofigo monthly started in December 2020.  He completed 3 months of therapy.    Interim History: Alec Harris is here for  a follow-up visit.  Since the last visit, he received last treatment of Xofigo without any major complications.  He does have a chronic pain related to his left lower extremity due to vascular causes rather than bone pain.  He only takes Advil for the pain.  He denies any chest pain sternal pain, hip pain or any other bone pain.   Medications: Unchanged on review. Current Outpatient Medications  Medication Sig Dispense Refill  . amLODipine (NORVASC) 5 MG tablet TAKE 1 TABLET (5 MG TOTAL) BY MOUTH DAILY. 90 tablet 2  . aspirin EC 81 MG tablet Take 81 mg by mouth daily.    [name] atorvastatin (LIPITOR) 20 MG tablet TAKE 1 TABLET BY MOUTH EVERY DAY 90 tablet 2  . Calcium Carb-Cholecalciferol (CALCIUM 500 +D) 500-400 MG-UNIT TABS Take 1 tablet by mouth daily.     . cilostazol (PLETAL) 100 MG tablet TAKE 1 TABLET BY MOUTH TWICE A DAY 180 tablet 2  . HYDROcodone-acetaminophen (NORCO/VICODIN) 5-325 MG tablet Take 1 tablet by mouth every 8 (eight) hours as needed for moderate pain. 30 tablet 0  . lidocaine-prilocaine (EMLA) cream Apply 1 application topically as needed. 30 g 0  . megestrol (MEGACE) 40 MG/ML suspension TAKE 10 MLS (400 MG TOTAL) BY MOUTH DAILY. 240 mL 0  . traMADol (ULTRAM) 50 MG tablet Take 1 tablet (50 mg total) by mouth every 6 (six) hours as needed. 30 tablet 0   No current facility-administered medications for this visit.  Allergies: No Known Allergies   Physical Exam:     Blood pressure 126/70, pulse (!) 101, temperature 98.7 F (37.1 C), temperature source Temporal, resp. rate 20, height 5\' 9"  (1.753 m), weight 168 lb 4.8 oz (76.3 kg), SpO2 100 %.      ECOG: 1     General appearance: Comfortable appearing without any discomfort Head: Normocephalic without any trauma Oropharynx: Mucous membranes are moist and pink without any thrush or ulcers. Eyes: Pupils are equal and round reactive to light. Lymph nodes: No cervical, supraclavicular, inguinal or axillary  lymphadenopathy.   Heart:regular rate and rhythm.  S1 and S2 without leg edema. Lung: Clear without any rhonchi or wheezes.  No dullness to percussion. Abdomin: Soft, nontender, nondistended with good bowel sounds.  No hepatosplenomegaly. Musculoskeletal: No joint deformity or effusion.  Full range of motion noted. Neurological: No deficits noted on motor, sensory and deep tendon reflex exam. Skin: No petechial rash or dryness.  Appeared moist.                  Lab Results: Lab Results  Component Value Date   WBC 4.8 10/18/2019   HGB 11.2 (L) 10/18/2019   HCT 34.7 (L) 10/18/2019   MCV 96.4 10/18/2019   PLT 68 (L) 10/18/2019     Chemistry      Component Value Date/Time   NA 141 10/18/2019 1432   NA 137 08/05/2017 0839   K 3.8 10/18/2019 1432   K 3.9 08/05/2017 0839   CL 111 10/18/2019 1432   CL 106 02/01/2013 1120   CO2 20 (L) 10/18/2019 1432   CO2 23 08/05/2017 0839   BUN 16 10/18/2019 1432   BUN 12.2 08/05/2017 0839   CREATININE 0.82 10/18/2019 1432   CREATININE 0.9 08/05/2017 0839      Component Value Date/Time   CALCIUM 8.3 (L) 10/18/2019 1432   CALCIUM 8.6 08/05/2017 0839   ALKPHOS 1,265 (H) 10/18/2019 1432   ALKPHOS 146 08/05/2017 0839   AST 11 (L) 10/18/2019 1432   AST 7 08/05/2017 0839   ALT 9 10/18/2019 1432   ALT <6 08/05/2017 0839   BILITOT 0.6 10/18/2019 1432   BILITOT 0.46 08/05/2017 0839       Results for Alec Harris, Alec Harris (MRN [id number]) as of 10/20/2019 13:03  Ref. Range 08/29/2019 15:01 10/18/2019 14:32  Prostate Specific Ag, Serum Latest Ref Range: 0.0 - 4.0 ng/mL 245.0 (H) 292.0 (H)         Impression and Plan:   74 year old man with:  1.  Advanced prostate cancer with disease to the bone since 2013.  He has castration-resistant at this time.  He continues to tolerate the Xofigo without any major complications.  The natural course of this disease was reviewed and treatment options were also discussed.  His PSA is showing slight  increase as well as slight increase in his alkaline phosphatase.  Although it is possible responses may be occurring at a later date upon completing 6 months of Xofigo.  Guardant 360 analysis in December 2020 did not show any actionable mutation at that time.  He is agreeable to continue at this time.   2. Androgen deprivation.  Next Eligard will be given in May 2021.  He is agreeable to it.   3. Bone directed therapy.  I recommended calcium and vitamin D supplementation to continue.  He has deferred the option of Xgeva.  4.  Thrombocytopenia: Related to malignancy and likely radiation therapy.  No active bleeding noted at  this time  5. Bone pain: Very little noted at this time.  6. IV access: Port-A-Cath remains in place and will be flushed periodically.  7.  Prognosis and goals of care: Therapy remains palliative although aggressive measures are warranted for the time being.  8.  Peripheral vascular disease: He is followed by Dr. Gwenlyn Found.  He is scheduled to have angiography and possible intervention in his left lower extremity.  8. Follow up: He will return in May 2021 for repeat evaluation and Eligard injection.  30 minutes were dedicated to this visit.  The time was spent on reviewing laboratory data, disease status update as well as addressing complications related to future plan of care.   Zola Button, MD 3/4/20211:01 PM

## 2019-10-21 ENCOUNTER — Telehealth: Payer: Self-pay | Admitting: Oncology

## 2019-10-21 NOTE — Telephone Encounter (Signed)
Scheduled appt per 3/4 los.  Spoke with pt and per his request to mail out a calendar.  Sent a message to HIM pool to get a calendar mailed out.

## 2019-10-24 ENCOUNTER — Other Ambulatory Visit (HOSPITAL_COMMUNITY): Payer: Self-pay | Admitting: Radiation Oncology

## 2019-10-24 ENCOUNTER — Telehealth: Payer: Self-pay | Admitting: *Deleted

## 2019-10-24 DIAGNOSIS — C61 Malignant neoplasm of prostate: Secondary | ICD-10-CM

## 2019-10-24 DIAGNOSIS — C7951 Secondary malignant neoplasm of bone: Secondary | ICD-10-CM

## 2019-10-24 NOTE — Telephone Encounter (Signed)
Called patient to inform of lab and weight appt. on 11-04-19 @ 12 pm @ [place] and his Xofigo Inj. on 11-11-19 - arrival time- 11:45 am @ [facility], spoke with patient and he is aware of these appts.

## 2019-10-25 ENCOUNTER — Other Ambulatory Visit (HOSPITAL_COMMUNITY): Payer: Self-pay | Admitting: Cardiovascular Disease

## 2019-10-25 DIAGNOSIS — I739 Peripheral vascular disease, unspecified: Secondary | ICD-10-CM

## 2019-10-31 ENCOUNTER — Telehealth: Payer: Self-pay | Admitting: Radiation Oncology

## 2019-10-31 ENCOUNTER — Other Ambulatory Visit: Payer: Self-pay | Admitting: Oncology

## 2019-10-31 MED ORDER — HYDROCODONE-ACETAMINOPHEN 5-325 MG PO TABS: 1.0000 | ORAL_TABLET | Freq: Three times a day (TID) | ORAL | 0 refills | Status: DC | PRN

## 2019-10-31 NOTE — Telephone Encounter (Signed)
Received voicemail message from patient's wife, Alec Harris, requesting a return call. Phoned her back promptly and she explains "the medication Dr. [name] Blew sent in doesn't help." Explained my understanding from my conversation with her husband earlier that it was Tramadol he stopped taking because it didn't work." Then, wife handed phone to patient who explained hydrocodone-acetaminophen doesn't work either however, patient denies taking any. Explained Dr. [name] Blew manages his pain medication thus a call would need to be made to his office reference this matter. Provided patient's wife with Dr. Hazeline Junker desk nurses' number and Alec Harris read it back correctly.

## 2019-10-31 NOTE — Telephone Encounter (Signed)
Phoned patient back as directed by Dr. Tammi Klippel. Explained to the patient that he has an area of concern at L2 and L3 which is likely causing his back pain. Went onto explain these areas may be amendable to radiation therapy. Explained simulation staff will be in contact with him tomorrow to set up a time for CT simulation. Additionally, I explained to the patient that Dr. [name] Blew has sent in a refill of his hydrocodone-acetaminophen to take prn to manage his pain. Patient verbalized understanding and expressed appreciation for the call back.

## 2019-10-31 NOTE — Telephone Encounter (Signed)
I will send a refill for hydrocodone to use as needed

## 2019-10-31 NOTE — Telephone Encounter (Signed)
Received voicemail message from [place], patient's wife, requesting a return call. Phoned back promptly and spoke directly with the patient. Patient known to have castration resistant prostate cancer with painful bone involvement.  Patient reports new onset low back pain x 2 weeks. Patient reports the pain is constant but denies its progressive. Reports intermittent sharp pain that takes his breath away with certain movements. Patient denies changes in bowel or bladder. Patient denies numbness of lower extremities. Patient reports when he is laying still in his bed he is pain free. Patient confirms he has tramadol but reports not taking this "because it doesn't help." This RN inquired if the patient still has hydrocodone-acetaminophen 5/325 prescribed by Dr. [name] Blew November of 2020. Patient confirms he still has this medication but denies taking it recently to manage new pain. Patient confirms he has several hydrocodone-acetaminophen pills left. Patient denies any recent falls.   Reports blurry vision in both eyes that began over the weekend. Denies any other neurologic symptoms. Patient reports increasing generalized weakness. Patient denies difficulty eating or drinking. Also, patient expresses concern about bruising easily. Platelet count 13 days ago noted to be 68 down from 127 one month prior. Explained to the patient his thrombocytopenia is likely the cause of his bruising. Patient verbalized understanding.   Patient has received 3 xofigo injections thus far with the fourth scheduled for 11/11/2019. Last imaging done 05/05/2019 a bone scan. Patient scheduled to see [name] again on 12/29/2019.   Patient understands this RN will relay this information to the providers and phone him back with direction. Patient verbalized understanding and expressed appreciation for the call.

## 2019-11-01 ENCOUNTER — Other Ambulatory Visit: Payer: Self-pay | Admitting: Radiation Oncology

## 2019-11-01 ENCOUNTER — Ambulatory Visit: Payer: Medicare Other | Admitting: Radiation Oncology

## 2019-11-01 ENCOUNTER — Other Ambulatory Visit: Payer: Self-pay | Admitting: Oncology

## 2019-11-01 ENCOUNTER — Telehealth: Payer: Self-pay

## 2019-11-01 DIAGNOSIS — C7952 Secondary malignant neoplasm of bone marrow: Secondary | ICD-10-CM

## 2019-11-01 DIAGNOSIS — C7951 Secondary malignant neoplasm of bone: Secondary | ICD-10-CM

## 2019-11-01 MED ORDER — OXYCODONE HCL 5 MG PO TABS: 5.0000 mg | ORAL_TABLET | ORAL | 0 refills | Status: DC | PRN

## 2019-11-01 NOTE — Telephone Encounter (Signed)
-----   Message from [name], MD sent at 11/01/2019  7:37 AM EDT ----- Regarding: RE: Pain medication request Oxycodone prescription was sent for him in case hydrocodone is not effective.  Thanks ----- Message ----- From: Teodoro Spray, RN Sent: 10/31/2019   2:38 PM EDT To: [name], MD Subject: Pain medication request                        Patient called office stating his current pain meds are not working and complains of 10/10 back pain that "comes and goes".  A refill for hydrocodone-acetominophen 5-325 was sent in today. Patient states he has picked up the refill but refuses to take them, stating they have not worked in the past.  Patient states he is currently taking Advil for the pain, but is requesting something else.    Please advise.  Thank you.

## 2019-11-01 NOTE — Telephone Encounter (Signed)
I just spoke with Alec Harris, in [place] SIM and she is going to call the patient to offer a SIM appointment this afternoon, in anticipation of beginning his treatments later this week.  Nicholos Johns, MMS, PA-C [place] [facility] [place]: [phone]  Fax: [phone]

## 2019-11-01 NOTE — Telephone Encounter (Signed)
See message below.  Patient informed about new prescription sent to his pharmacy. Patient instructed to let office know if his pain does not improve or becomes worse. Patient verbalized understanding.

## 2019-11-02 ENCOUNTER — Encounter: Payer: Self-pay | Admitting: Radiation Oncology

## 2019-11-03 ENCOUNTER — Ambulatory Visit: Payer: Medicare Other

## 2019-11-03 ENCOUNTER — Telehealth: Payer: Self-pay | Admitting: *Deleted

## 2019-11-03 NOTE — Telephone Encounter (Signed)
CALLED PATIENT TO REMIND OF LAB AND WEIGHT FOR 11-04-19 @ 12 PM @ [place], SPOKE WITH PATIENT AND HE IS AWARE OF THIS APPT.

## 2019-11-04 ENCOUNTER — Ambulatory Visit
Admission: RE | Admit: 2019-11-04 | Discharge: 2019-11-04 | Disposition: A | Discharge status: Home / Self Care | Payer: Medicare Other | Source: Ambulatory Visit | Attending: Radiation Oncology | Admitting: Radiation Oncology

## 2019-11-04 ENCOUNTER — Other Ambulatory Visit: Payer: Self-pay

## 2019-11-04 ENCOUNTER — Ambulatory Visit: Payer: Medicare Other

## 2019-11-04 DIAGNOSIS — C61 Malignant neoplasm of prostate: Secondary | ICD-10-CM | POA: Insufficient documentation

## 2019-11-04 DIAGNOSIS — C7951 Secondary malignant neoplasm of bone: Secondary | ICD-10-CM

## 2019-11-04 DIAGNOSIS — C7952 Secondary malignant neoplasm of bone marrow: Secondary | ICD-10-CM | POA: Diagnosis not present

## 2019-11-04 DIAGNOSIS — Z923 Personal history of irradiation: Secondary | ICD-10-CM | POA: Insufficient documentation

## 2019-11-04 DIAGNOSIS — Z79818 Long term (current) use of other agents affecting estrogen receptors and estrogen levels: Secondary | ICD-10-CM | POA: Diagnosis not present

## 2019-11-04 LAB — CBC WITH DIFFERENTIAL (CANCER CENTER ONLY)
Abs Immature Granulocytes: 0.05 10*3/uL (ref 0.00–0.07)
Basophils Absolute: 0 10*3/uL (ref 0.0–0.1)
Basophils Relative: 0 %
Eosinophils Absolute: 0 10*3/uL (ref 0.0–0.5)
Eosinophils Relative: 1 %
HCT: 32.5 % — ABNORMAL LOW (ref 39.0–52.0)
Hemoglobin: 10.7 g/dL — ABNORMAL LOW (ref 13.0–17.0)
Immature Granulocytes: 1 %
Lymphocytes Relative: 21 %
Lymphs Abs: 1.1 10*3/uL (ref 0.7–4.0)
MCH: 32.3 pg (ref 26.0–34.0)
MCHC: 32.9 g/dL (ref 30.0–36.0)
MCV: 98.2 fL (ref 80.0–100.0)
Monocytes Absolute: 0.8 10*3/uL (ref 0.1–1.0)
Monocytes Relative: 14 %
Neutro Abs: 3.5 10*3/uL (ref 1.7–7.7)
Neutrophils Relative %: 63 %
Platelet Count: 60 10*3/uL — ABNORMAL LOW (ref 150–400)
RBC: 3.31 MIL/uL — ABNORMAL LOW (ref 4.22–5.81)
RDW: 17.3 % — ABNORMAL HIGH (ref 11.5–15.5)
WBC Count: 5.5 10*3/uL (ref 4.0–10.5)
nRBC: 0 % (ref 0.0–0.2)

## 2019-11-07 ENCOUNTER — Ambulatory Visit: Payer: Medicare Other

## 2019-11-08 ENCOUNTER — Telehealth: Payer: Self-pay | Admitting: Radiation Oncology

## 2019-11-08 ENCOUNTER — Ambulatory Visit (HOSPITAL_COMMUNITY)
Admission: RE | Admit: 2019-11-08 | Payer: Medicare Other | Source: Ambulatory Visit | Attending: Cardiovascular Disease | Admitting: Cardiovascular Disease

## 2019-11-08 ENCOUNTER — Ambulatory Visit: Payer: Medicare Other | Admitting: Radiation Oncology

## 2019-11-08 ENCOUNTER — Encounter (HOSPITAL_COMMUNITY): Payer: Medicare Other

## 2019-11-08 ENCOUNTER — Ambulatory Visit: Payer: Medicare Other | Admitting: Cardiovascular Disease

## 2019-11-08 ENCOUNTER — Ambulatory Visit (HOSPITAL_COMMUNITY): Payer: Medicare Other

## 2019-11-08 ENCOUNTER — Ambulatory Visit: Payer: Medicare Other

## 2019-11-08 NOTE — Telephone Encounter (Signed)
I called the patient to see if he could come tomorrow for simulation at 2pm instead of 2:30pm. He plans to come at that time.

## 2019-11-09 ENCOUNTER — Ambulatory Visit
Admission: RE | Admit: 2019-11-09 | Discharge: 2019-11-09 | Disposition: A | Discharge status: Home / Self Care | Payer: Medicare Other | Source: Ambulatory Visit | Attending: Radiation Oncology | Admitting: Radiation Oncology

## 2019-11-09 ENCOUNTER — Ambulatory Visit: Payer: Medicare Other

## 2019-11-09 ENCOUNTER — Other Ambulatory Visit: Payer: Self-pay

## 2019-11-09 DIAGNOSIS — C7951 Secondary malignant neoplasm of bone: Secondary | ICD-10-CM | POA: Diagnosis not present

## 2019-11-09 DIAGNOSIS — C61 Malignant neoplasm of prostate: Secondary | ICD-10-CM

## 2019-11-09 DIAGNOSIS — Z79818 Long term (current) use of other agents affecting estrogen receptors and estrogen levels: Secondary | ICD-10-CM | POA: Diagnosis not present

## 2019-11-09 DIAGNOSIS — C7952 Secondary malignant neoplasm of bone marrow: Secondary | ICD-10-CM | POA: Diagnosis not present

## 2019-11-09 DIAGNOSIS — Z923 Personal history of irradiation: Secondary | ICD-10-CM | POA: Diagnosis not present

## 2019-11-09 NOTE — Progress Notes (Signed)
  Radiation Oncology         [phone] ________________________________  Name: Alec Harris MRN: [id number]  Date: 11/09/2019  DOB: 09-12-1945  SIMULATION AND TREATMENT PLANNING NOTE    ICD-10-CM   1. Malignant neoplasm of prostate metastatic to bone ([place])  C61    C79.51     DIAGNOSIS:  74 y.o. patient with L3 lumbar metastasis  NARRATIVE:  The patient was brought to the [place].  Identity was confirmed.  All relevant records and images related to the planned course of therapy were reviewed.  The patient freely provided informed written consent to proceed with treatment after reviewing the details related to the planned course of therapy. The consent form was witnessed and verified by the simulation staff.  Then, the patient was set-up in a stable reproducible  supine position for radiation therapy.  CT images were obtained.  Surface markings were placed.  The CT images were loaded into the planning software.  Then the target and avoidance structures were contoured including kidneys.  Treatment planning then occurred.  The radiation prescription was entered and confirmed.  Then, I designed and supervised the construction of a total of 3 medically necessary complex treatment devices with VacLoc positioner and 2 MLCs to shield kidneys.  I have requested : 3D Simulation  I have requested a DVH of the following structures: Left Kidney, Right Kidney and target.  PLAN:  The patient will receive 30 Gy in 10 fractions.  ________________________________  Sheral Apley Tammi Klippel, M.D.

## 2019-11-10 ENCOUNTER — Other Ambulatory Visit: Payer: Self-pay

## 2019-11-10 ENCOUNTER — Ambulatory Visit
Admission: RE | Admit: 2019-11-10 | Discharge: 2019-11-10 | Disposition: A | Discharge status: Home / Self Care | Payer: Medicare Other | Source: Ambulatory Visit | Attending: Radiation Oncology | Admitting: Radiation Oncology

## 2019-11-10 ENCOUNTER — Ambulatory Visit: Payer: Medicare Other

## 2019-11-10 ENCOUNTER — Telehealth: Payer: Self-pay | Admitting: *Deleted

## 2019-11-10 DIAGNOSIS — C61 Malignant neoplasm of prostate: Secondary | ICD-10-CM | POA: Diagnosis not present

## 2019-11-10 DIAGNOSIS — C7952 Secondary malignant neoplasm of bone marrow: Secondary | ICD-10-CM | POA: Diagnosis not present

## 2019-11-10 DIAGNOSIS — Z79818 Long term (current) use of other agents affecting estrogen receptors and estrogen levels: Secondary | ICD-10-CM | POA: Diagnosis not present

## 2019-11-10 DIAGNOSIS — C7951 Secondary malignant neoplasm of bone: Secondary | ICD-10-CM | POA: Diagnosis not present

## 2019-11-10 DIAGNOSIS — Z923 Personal history of irradiation: Secondary | ICD-10-CM | POA: Diagnosis not present

## 2019-11-10 NOTE — Telephone Encounter (Signed)
CALLED PATIENT TO REMIND OF XOFIGO INJ. FOR 11-11-19 - ARRIVAL TIME- 11:45 AM @ [facility], SPOKE WITH PATIENT AND HE IS AWARE OF THIS APPT.

## 2019-11-11 ENCOUNTER — Ambulatory Visit
Admission: RE | Admit: 2019-11-11 | Discharge: 2019-11-11 | Disposition: A | Discharge status: Home / Self Care | Payer: Medicare Other | Source: Ambulatory Visit | Attending: Radiation Oncology | Admitting: Radiation Oncology

## 2019-11-11 ENCOUNTER — Other Ambulatory Visit: Payer: Self-pay

## 2019-11-11 ENCOUNTER — Ambulatory Visit: Payer: Medicare Other

## 2019-11-11 ENCOUNTER — Encounter (HOSPITAL_COMMUNITY)
Admission: RE | Admit: 2019-11-11 | Discharge: 2019-11-11 | Disposition: A | Discharge status: Home / Self Care | Payer: Medicare Other | Source: Ambulatory Visit | Attending: Radiation Oncology | Admitting: Radiation Oncology

## 2019-11-11 DIAGNOSIS — C7951 Secondary malignant neoplasm of bone: Secondary | ICD-10-CM | POA: Diagnosis not present

## 2019-11-11 DIAGNOSIS — C61 Malignant neoplasm of prostate: Secondary | ICD-10-CM | POA: Insufficient documentation

## 2019-11-11 MED ORDER — RADIUM RA 223 DICHLORIDE 30 MCCI/ML IV SOLN
103.0000 | Freq: Once | INTRAVENOUS | Status: AC
  Administered 2019-11-11: 103 via INTRAVENOUS

## 2019-11-14 ENCOUNTER — Ambulatory Visit: Payer: Medicare Other

## 2019-11-14 ENCOUNTER — Ambulatory Visit
Admission: RE | Admit: 2019-11-14 | Discharge: 2019-11-14 | Disposition: A | Discharge status: Home / Self Care | Payer: Medicare Other | Source: Ambulatory Visit | Attending: Radiation Oncology | Admitting: Radiation Oncology

## 2019-11-14 ENCOUNTER — Other Ambulatory Visit: Payer: Self-pay

## 2019-11-14 DIAGNOSIS — Z923 Personal history of irradiation: Secondary | ICD-10-CM | POA: Diagnosis not present

## 2019-11-14 DIAGNOSIS — Z79818 Long term (current) use of other agents affecting estrogen receptors and estrogen levels: Secondary | ICD-10-CM | POA: Diagnosis not present

## 2019-11-14 DIAGNOSIS — C7951 Secondary malignant neoplasm of bone: Secondary | ICD-10-CM | POA: Diagnosis not present

## 2019-11-14 DIAGNOSIS — C7952 Secondary malignant neoplasm of bone marrow: Secondary | ICD-10-CM | POA: Diagnosis not present

## 2019-11-14 DIAGNOSIS — C61 Malignant neoplasm of prostate: Secondary | ICD-10-CM | POA: Diagnosis not present

## 2019-11-15 ENCOUNTER — Ambulatory Visit
Admission: RE | Admit: 2019-11-15 | Discharge: 2019-11-15 | Disposition: A | Discharge status: Home / Self Care | Payer: Medicare Other | Source: Ambulatory Visit | Attending: Radiation Oncology | Admitting: Radiation Oncology

## 2019-11-15 ENCOUNTER — Ambulatory Visit: Payer: Medicare Other

## 2019-11-15 DIAGNOSIS — C7952 Secondary malignant neoplasm of bone marrow: Secondary | ICD-10-CM | POA: Diagnosis not present

## 2019-11-15 DIAGNOSIS — C61 Malignant neoplasm of prostate: Secondary | ICD-10-CM | POA: Diagnosis not present

## 2019-11-15 DIAGNOSIS — C7951 Secondary malignant neoplasm of bone: Secondary | ICD-10-CM | POA: Diagnosis not present

## 2019-11-15 DIAGNOSIS — Z79818 Long term (current) use of other agents affecting estrogen receptors and estrogen levels: Secondary | ICD-10-CM | POA: Diagnosis not present

## 2019-11-15 DIAGNOSIS — Z923 Personal history of irradiation: Secondary | ICD-10-CM | POA: Diagnosis not present

## 2019-11-16 ENCOUNTER — Ambulatory Visit: Payer: Medicare Other

## 2019-11-16 ENCOUNTER — Ambulatory Visit
Admission: RE | Admit: 2019-11-16 | Discharge: 2019-11-16 | Disposition: A | Discharge status: Home / Self Care | Payer: Medicare Other | Source: Ambulatory Visit | Attending: Radiation Oncology | Admitting: Radiation Oncology

## 2019-11-16 ENCOUNTER — Other Ambulatory Visit: Payer: Self-pay

## 2019-11-16 DIAGNOSIS — C7952 Secondary malignant neoplasm of bone marrow: Secondary | ICD-10-CM | POA: Diagnosis not present

## 2019-11-16 DIAGNOSIS — Z923 Personal history of irradiation: Secondary | ICD-10-CM | POA: Diagnosis not present

## 2019-11-16 DIAGNOSIS — Z79818 Long term (current) use of other agents affecting estrogen receptors and estrogen levels: Secondary | ICD-10-CM | POA: Diagnosis not present

## 2019-11-16 DIAGNOSIS — C61 Malignant neoplasm of prostate: Secondary | ICD-10-CM | POA: Diagnosis not present

## 2019-11-16 DIAGNOSIS — C7951 Secondary malignant neoplasm of bone: Secondary | ICD-10-CM | POA: Diagnosis not present

## 2019-11-17 ENCOUNTER — Ambulatory Visit
Admission: RE | Admit: 2019-11-17 | Discharge: 2019-11-17 | Disposition: A | Discharge status: Home / Self Care | Payer: Medicare Other | Source: Ambulatory Visit | Attending: Radiation Oncology | Admitting: Radiation Oncology

## 2019-11-17 ENCOUNTER — Other Ambulatory Visit: Payer: Self-pay

## 2019-11-17 DIAGNOSIS — M898X9 Other specified disorders of bone, unspecified site: Secondary | ICD-10-CM | POA: Insufficient documentation

## 2019-11-17 DIAGNOSIS — C61 Malignant neoplasm of prostate: Secondary | ICD-10-CM | POA: Diagnosis not present

## 2019-11-17 DIAGNOSIS — C7951 Secondary malignant neoplasm of bone: Secondary | ICD-10-CM | POA: Diagnosis not present

## 2019-11-17 DIAGNOSIS — C7952 Secondary malignant neoplasm of bone marrow: Secondary | ICD-10-CM | POA: Insufficient documentation

## 2019-11-17 DIAGNOSIS — Z79818 Long term (current) use of other agents affecting estrogen receptors and estrogen levels: Secondary | ICD-10-CM | POA: Diagnosis not present

## 2019-11-17 DIAGNOSIS — G893 Neoplasm related pain (acute) (chronic): Secondary | ICD-10-CM | POA: Insufficient documentation

## 2019-11-17 DIAGNOSIS — Z923 Personal history of irradiation: Secondary | ICD-10-CM | POA: Insufficient documentation

## 2019-11-18 ENCOUNTER — Other Ambulatory Visit: Payer: Self-pay

## 2019-11-18 ENCOUNTER — Ambulatory Visit
Admission: RE | Admit: 2019-11-18 | Discharge: 2019-11-18 | Disposition: A | Discharge status: Home / Self Care | Payer: Medicare Other | Source: Ambulatory Visit | Attending: Radiation Oncology | Admitting: Radiation Oncology

## 2019-11-18 DIAGNOSIS — C61 Malignant neoplasm of prostate: Secondary | ICD-10-CM | POA: Diagnosis not present

## 2019-11-18 DIAGNOSIS — Z79818 Long term (current) use of other agents affecting estrogen receptors and estrogen levels: Secondary | ICD-10-CM | POA: Diagnosis not present

## 2019-11-18 DIAGNOSIS — C7952 Secondary malignant neoplasm of bone marrow: Secondary | ICD-10-CM | POA: Diagnosis not present

## 2019-11-18 DIAGNOSIS — G893 Neoplasm related pain (acute) (chronic): Secondary | ICD-10-CM | POA: Diagnosis not present

## 2019-11-18 DIAGNOSIS — C7951 Secondary malignant neoplasm of bone: Secondary | ICD-10-CM | POA: Diagnosis not present

## 2019-11-18 DIAGNOSIS — M898X9 Other specified disorders of bone, unspecified site: Secondary | ICD-10-CM | POA: Diagnosis not present

## 2019-11-20 ENCOUNTER — Other Ambulatory Visit: Payer: Self-pay | Admitting: Oncology

## 2019-11-21 ENCOUNTER — Other Ambulatory Visit: Payer: Self-pay

## 2019-11-21 ENCOUNTER — Ambulatory Visit
Admission: RE | Admit: 2019-11-21 | Discharge: 2019-11-21 | Disposition: A | Discharge status: Home / Self Care | Payer: Medicare Other | Source: Ambulatory Visit | Attending: Radiation Oncology | Admitting: Radiation Oncology

## 2019-11-21 DIAGNOSIS — Z79818 Long term (current) use of other agents affecting estrogen receptors and estrogen levels: Secondary | ICD-10-CM | POA: Diagnosis not present

## 2019-11-21 DIAGNOSIS — G893 Neoplasm related pain (acute) (chronic): Secondary | ICD-10-CM | POA: Diagnosis not present

## 2019-11-21 DIAGNOSIS — C61 Malignant neoplasm of prostate: Secondary | ICD-10-CM | POA: Diagnosis not present

## 2019-11-21 DIAGNOSIS — C7951 Secondary malignant neoplasm of bone: Secondary | ICD-10-CM | POA: Diagnosis not present

## 2019-11-21 DIAGNOSIS — M898X9 Other specified disorders of bone, unspecified site: Secondary | ICD-10-CM | POA: Diagnosis not present

## 2019-11-21 DIAGNOSIS — C7952 Secondary malignant neoplasm of bone marrow: Secondary | ICD-10-CM | POA: Diagnosis not present

## 2019-11-22 ENCOUNTER — Other Ambulatory Visit: Payer: Self-pay

## 2019-11-22 ENCOUNTER — Ambulatory Visit
Admission: RE | Admit: 2019-11-22 | Discharge: 2019-11-22 | Disposition: A | Discharge status: Home / Self Care | Payer: Medicare Other | Source: Ambulatory Visit | Attending: Radiation Oncology | Admitting: Radiation Oncology

## 2019-11-22 DIAGNOSIS — G893 Neoplasm related pain (acute) (chronic): Secondary | ICD-10-CM | POA: Diagnosis not present

## 2019-11-22 DIAGNOSIS — M898X9 Other specified disorders of bone, unspecified site: Secondary | ICD-10-CM | POA: Diagnosis not present

## 2019-11-22 DIAGNOSIS — C61 Malignant neoplasm of prostate: Secondary | ICD-10-CM | POA: Diagnosis not present

## 2019-11-22 DIAGNOSIS — C7952 Secondary malignant neoplasm of bone marrow: Secondary | ICD-10-CM | POA: Diagnosis not present

## 2019-11-22 DIAGNOSIS — Z79818 Long term (current) use of other agents affecting estrogen receptors and estrogen levels: Secondary | ICD-10-CM | POA: Diagnosis not present

## 2019-11-22 DIAGNOSIS — C7951 Secondary malignant neoplasm of bone: Secondary | ICD-10-CM | POA: Diagnosis not present

## 2019-11-23 ENCOUNTER — Ambulatory Visit: Payer: Medicare Other

## 2019-11-23 ENCOUNTER — Other Ambulatory Visit (HOSPITAL_COMMUNITY): Payer: Self-pay | Admitting: Radiation Oncology

## 2019-11-23 ENCOUNTER — Telehealth: Payer: Self-pay | Admitting: *Deleted

## 2019-11-23 DIAGNOSIS — C61 Malignant neoplasm of prostate: Secondary | ICD-10-CM

## 2019-11-23 DIAGNOSIS — C7951 Secondary malignant neoplasm of bone: Secondary | ICD-10-CM

## 2019-11-23 NOTE — Telephone Encounter (Signed)
Called patient to inform of lab and weight appt. for 12-02-19 and his Trudi Ida for 12-09-19, lvm for a return call

## 2019-11-24 ENCOUNTER — Ambulatory Visit
Admission: RE | Admit: 2019-11-24 | Discharge: 2019-11-24 | Disposition: A | Discharge status: Home / Self Care | Payer: Medicare Other | Source: Ambulatory Visit | Attending: Radiation Oncology | Admitting: Radiation Oncology

## 2019-11-24 ENCOUNTER — Encounter: Payer: Self-pay | Admitting: Urology

## 2019-11-24 ENCOUNTER — Other Ambulatory Visit: Payer: Self-pay

## 2019-11-24 DIAGNOSIS — Z79818 Long term (current) use of other agents affecting estrogen receptors and estrogen levels: Secondary | ICD-10-CM | POA: Diagnosis not present

## 2019-11-24 DIAGNOSIS — C7952 Secondary malignant neoplasm of bone marrow: Secondary | ICD-10-CM | POA: Diagnosis not present

## 2019-11-24 DIAGNOSIS — C7951 Secondary malignant neoplasm of bone: Secondary | ICD-10-CM | POA: Diagnosis not present

## 2019-11-24 DIAGNOSIS — G893 Neoplasm related pain (acute) (chronic): Secondary | ICD-10-CM | POA: Diagnosis not present

## 2019-11-24 DIAGNOSIS — M898X9 Other specified disorders of bone, unspecified site: Secondary | ICD-10-CM | POA: Diagnosis not present

## 2019-11-24 DIAGNOSIS — C61 Malignant neoplasm of prostate: Secondary | ICD-10-CM | POA: Diagnosis not present

## 2019-11-29 ENCOUNTER — Other Ambulatory Visit: Payer: Self-pay | Admitting: Radiation Oncology

## 2019-11-29 DIAGNOSIS — C61 Malignant neoplasm of prostate: Secondary | ICD-10-CM

## 2019-11-29 DIAGNOSIS — G893 Neoplasm related pain (acute) (chronic): Secondary | ICD-10-CM

## 2019-11-29 DIAGNOSIS — C7951 Secondary malignant neoplasm of bone: Secondary | ICD-10-CM

## 2019-11-30 NOTE — Progress Notes (Signed)
  Radiation Oncology         [phone] ________________________________  Name: Casyn Torrence MRN: [id number]  Date: 11/11/2019  DOB: 1946/08/07  Radium-223 Infusion Note  Diagnosis:  Castration resistant prostate cancer with painful bone involvement  Current Infusion:    4  Planned Infusions:  6  Narrative: Mr. Adarsh Veldman presented to nuclear medicine for treatment. His most recent blood counts were reviewed.  He remains a good candidate to proceed with Ra-223.  The patient was situated in an infusion suite with a contact barrier placed under his arm. Intravenous access was established, using sterile technique, and a normal saline infusion from a syringe was started.  Micro-dosimetry:  The prescribed radiation activity was assayed and confirmed to be within specified tolerance.  Special Treatment Procedure - Infusion:  The nuclear medicine technologist and I personally verified the dose activity to be delivered as specified in the written directive, and verified the patient identification via 2 separate methods.  The syringe containing the dose was attached to an intravenous access and the dose delivered over a minute. No complications were noted.  The total administered dose was 106.3 microcuries.   A saline flush of the line and the syringe that contained the isotope was then performed.  The residual radioactivity in the syringe was 3.3 microcuries, so the actual infused isotope activity was 103.0 microcuries.   Pressure was applied to the venipuncture site, and a compression bandage placed.   Radiation Safety personnel were present to perform the discharge survey, as detailed on their documentation.   After a short period of observation, the patient had his IV removed.  Impression:  The patient tolerated his infusion relatively well.  Plan:  The patient will return in one month for ongoing care.    ________________________________  Sheral Apley. Tammi Klippel, M.D.

## 2019-12-01 ENCOUNTER — Telehealth: Payer: Self-pay | Admitting: *Deleted

## 2019-12-01 NOTE — Telephone Encounter (Signed)
Called patient to remind of lab and weight appt. for 12-02-19 @ 12 pm @ [place], spoke with patient and he is aware of this appt.

## 2019-12-02 ENCOUNTER — Ambulatory Visit
Admission: RE | Admit: 2019-12-02 | Discharge: 2019-12-02 | Disposition: A | Discharge status: Home / Self Care | Payer: Medicare Other | Source: Ambulatory Visit | Attending: Oncology | Admitting: Oncology

## 2019-12-02 ENCOUNTER — Other Ambulatory Visit: Payer: Self-pay

## 2019-12-02 DIAGNOSIS — G893 Neoplasm related pain (acute) (chronic): Secondary | ICD-10-CM

## 2019-12-02 DIAGNOSIS — C7951 Secondary malignant neoplasm of bone: Secondary | ICD-10-CM

## 2019-12-02 DIAGNOSIS — C7952 Secondary malignant neoplasm of bone marrow: Secondary | ICD-10-CM | POA: Diagnosis not present

## 2019-12-02 DIAGNOSIS — M898X9 Other specified disorders of bone, unspecified site: Secondary | ICD-10-CM | POA: Diagnosis not present

## 2019-12-02 DIAGNOSIS — C61 Malignant neoplasm of prostate: Secondary | ICD-10-CM | POA: Diagnosis not present

## 2019-12-02 DIAGNOSIS — Z79818 Long term (current) use of other agents affecting estrogen receptors and estrogen levels: Secondary | ICD-10-CM | POA: Diagnosis not present

## 2019-12-02 LAB — CBC WITH DIFFERENTIAL (CANCER CENTER ONLY)
Abs Immature Granulocytes: 0.09 10*3/uL — ABNORMAL HIGH (ref 0.00–0.07)
Basophils Absolute: 0 10*3/uL (ref 0.0–0.1)
Basophils Relative: 0 %
Eosinophils Absolute: 0.1 10*3/uL (ref 0.0–0.5)
Eosinophils Relative: 1 %
HCT: 31.4 % — ABNORMAL LOW (ref 39.0–52.0)
Hemoglobin: 10 g/dL — ABNORMAL LOW (ref 13.0–17.0)
Immature Granulocytes: 2 %
Lymphocytes Relative: 13 %
Lymphs Abs: 0.6 10*3/uL — ABNORMAL LOW (ref 0.7–4.0)
MCH: 31.4 pg (ref 26.0–34.0)
MCHC: 31.8 g/dL (ref 30.0–36.0)
MCV: 98.7 fL (ref 80.0–100.0)
Monocytes Absolute: 0.7 10*3/uL (ref 0.1–1.0)
Monocytes Relative: 15 %
Neutro Abs: 3.3 10*3/uL (ref 1.7–7.7)
Neutrophils Relative %: 69 %
Platelet Count: 72 10*3/uL — ABNORMAL LOW (ref 150–400)
RBC: 3.18 MIL/uL — ABNORMAL LOW (ref 4.22–5.81)
RDW: 18.1 % — ABNORMAL HIGH (ref 11.5–15.5)
WBC Count: 4.7 10*3/uL (ref 4.0–10.5)
nRBC: 0.4 % — ABNORMAL HIGH (ref 0.0–0.2)

## 2019-12-08 ENCOUNTER — Telehealth: Payer: Self-pay | Admitting: *Deleted

## 2019-12-08 NOTE — Telephone Encounter (Signed)
CALLED PATIENT TO REMIND OF XOFIGO INJ. FOR 12-09-19- ARRIVAL TIME- 11:45 AM @ [facility], SPOKE WITH PATIENT AND HE IS AWARE OF THIS APPT.

## 2019-12-09 ENCOUNTER — Ambulatory Visit (HOSPITAL_COMMUNITY)
Admission: RE | Admit: 2019-12-09 | Discharge: 2019-12-09 | Disposition: A | Discharge status: Home / Self Care | Payer: Medicare Other | Source: Ambulatory Visit | Attending: Radiation Oncology | Admitting: Radiation Oncology

## 2019-12-09 ENCOUNTER — Other Ambulatory Visit: Payer: Self-pay

## 2019-12-09 DIAGNOSIS — C7951 Secondary malignant neoplasm of bone: Secondary | ICD-10-CM | POA: Insufficient documentation

## 2019-12-09 DIAGNOSIS — C61 Malignant neoplasm of prostate: Secondary | ICD-10-CM | POA: Diagnosis not present

## 2019-12-09 MED ORDER — RADIUM RA 223 DICHLORIDE 30 MCCI/ML IV SOLN
102.3400 | Freq: Once | INTRAVENOUS | Status: AC | PRN
  Administered 2019-12-09: 102.34 via INTRAVENOUS

## 2019-12-13 ENCOUNTER — Telehealth: Payer: Self-pay | Admitting: *Deleted

## 2019-12-13 ENCOUNTER — Other Ambulatory Visit (HOSPITAL_COMMUNITY): Payer: Self-pay | Admitting: Radiation Oncology

## 2019-12-13 DIAGNOSIS — C61 Malignant neoplasm of prostate: Secondary | ICD-10-CM

## 2019-12-13 NOTE — Telephone Encounter (Signed)
CALLED PATIENT TO INFORM OF LAB AND WEIGHT APPT. ON 12-30-19 @ 12 PM @ [place]. ON 01-06-20 - ARRIVAL TIME- 11:45 AM @ [facility], SPOKE WITH PATIENT AND HE IS AWARE OF THESE APPTS.

## 2019-12-14 ENCOUNTER — Telehealth: Payer: Self-pay

## 2019-12-14 ENCOUNTER — Other Ambulatory Visit: Payer: Self-pay | Admitting: Emergency Medicine

## 2019-12-14 ENCOUNTER — Inpatient Hospital Stay: Payer: Medicare Other | Attending: Oncology | Admitting: Medical

## 2019-12-14 ENCOUNTER — Other Ambulatory Visit: Payer: Self-pay

## 2019-12-14 ENCOUNTER — Inpatient Hospital Stay: Payer: Medicare Other

## 2019-12-14 VITALS — BP 123/55 | HR 98 | Temp 99.1°F | Resp 16 | Ht 69.0 in

## 2019-12-14 DIAGNOSIS — I10 Essential (primary) hypertension: Secondary | ICD-10-CM | POA: Insufficient documentation

## 2019-12-14 DIAGNOSIS — C7951 Secondary malignant neoplasm of bone: Secondary | ICD-10-CM

## 2019-12-14 DIAGNOSIS — Z95828 Presence of other vascular implants and grafts: Secondary | ICD-10-CM | POA: Diagnosis not present

## 2019-12-14 DIAGNOSIS — G4709 Other insomnia: Secondary | ICD-10-CM | POA: Diagnosis not present

## 2019-12-14 DIAGNOSIS — C61 Malignant neoplasm of prostate: Secondary | ICD-10-CM

## 2019-12-14 DIAGNOSIS — G4700 Insomnia, unspecified: Secondary | ICD-10-CM | POA: Diagnosis not present

## 2019-12-14 DIAGNOSIS — E860 Dehydration: Secondary | ICD-10-CM

## 2019-12-14 DIAGNOSIS — Z9221 Personal history of antineoplastic chemotherapy: Secondary | ICD-10-CM | POA: Diagnosis not present

## 2019-12-14 DIAGNOSIS — R630 Anorexia: Secondary | ICD-10-CM

## 2019-12-14 DIAGNOSIS — I739 Peripheral vascular disease, unspecified: Secondary | ICD-10-CM | POA: Insufficient documentation

## 2019-12-14 DIAGNOSIS — Z79818 Long term (current) use of other agents affecting estrogen receptors and estrogen levels: Secondary | ICD-10-CM | POA: Insufficient documentation

## 2019-12-14 DIAGNOSIS — E785 Hyperlipidemia, unspecified: Secondary | ICD-10-CM | POA: Diagnosis not present

## 2019-12-14 DIAGNOSIS — Z79899 Other long term (current) drug therapy: Secondary | ICD-10-CM | POA: Diagnosis not present

## 2019-12-14 DIAGNOSIS — F17210 Nicotine dependence, cigarettes, uncomplicated: Secondary | ICD-10-CM | POA: Insufficient documentation

## 2019-12-14 DIAGNOSIS — M549 Dorsalgia, unspecified: Secondary | ICD-10-CM | POA: Diagnosis not present

## 2019-12-14 LAB — CMP (CANCER CENTER ONLY)
ALT: 15 U/L (ref 0–44)
AST: 19 U/L (ref 15–41)
Albumin: 3.1 g/dL — ABNORMAL LOW (ref 3.5–5.0)
Alkaline Phosphatase: 965 U/L — ABNORMAL HIGH (ref 38–126)
Anion gap: 10 (ref 5–15)
BUN: 13 mg/dL (ref 8–23)
CO2: 19 mmol/L — ABNORMAL LOW (ref 22–32)
Calcium: 8.4 mg/dL — ABNORMAL LOW (ref 8.9–10.3)
Chloride: 103 mmol/L (ref 98–111)
Creatinine: 0.7 mg/dL (ref 0.61–1.24)
GFR, Est AFR Am: >60 mL/min (ref >60)
GFR, Estimated: >60 mL/min (ref >60)
Glucose, Bld: 100 mg/dL — ABNORMAL HIGH (ref 70–99)
Potassium: 4.3 mmol/L (ref 3.5–5.1)
Sodium: 132 mmol/L — ABNORMAL LOW (ref 135–145)
Total Bilirubin: 1.5 mg/dL — ABNORMAL HIGH (ref 0.3–1.2)
Total Protein: 6.2 g/dL — ABNORMAL LOW (ref 6.5–8.1)

## 2019-12-14 LAB — SAMPLE TO BLOOD BANK

## 2019-12-14 LAB — CBC WITH DIFFERENTIAL (CANCER CENTER ONLY)
Abs Immature Granulocytes: 0.07 10*3/uL (ref 0.00–0.07)
Basophils Absolute: 0 10*3/uL (ref 0.0–0.1)
Basophils Relative: 0 %
Eosinophils Absolute: 0 10*3/uL (ref 0.0–0.5)
Eosinophils Relative: 1 %
HCT: 27.3 % — ABNORMAL LOW (ref 39.0–52.0)
Hemoglobin: 9 g/dL — ABNORMAL LOW (ref 13.0–17.0)
Immature Granulocytes: 2 %
Lymphocytes Relative: 11 %
Lymphs Abs: 0.5 10*3/uL — ABNORMAL LOW (ref 0.7–4.0)
MCH: 32.8 pg (ref 26.0–34.0)
MCHC: 33 g/dL (ref 30.0–36.0)
MCV: 99.6 fL (ref 80.0–100.0)
Monocytes Absolute: 0.5 10*3/uL (ref 0.1–1.0)
Monocytes Relative: 11 %
Neutro Abs: 3.5 10*3/uL (ref 1.7–7.7)
Neutrophils Relative %: 75 %
Platelet Count: 51 10*3/uL — ABNORMAL LOW (ref 150–400)
RBC: 2.74 MIL/uL — ABNORMAL LOW (ref 4.22–5.81)
RDW: 19.3 % — ABNORMAL HIGH (ref 11.5–15.5)
WBC Count: 4.7 10*3/uL (ref 4.0–10.5)
nRBC: 0.9 % — ABNORMAL HIGH (ref 0.0–0.2)

## 2019-12-14 LAB — MAGNESIUM: Magnesium: 1.9 mg/dL (ref 1.7–2.4)

## 2019-12-14 MED ORDER — SODIUM CHLORIDE 0.9% FLUSH
10.0000 mL | INTRAVENOUS | Status: DC | PRN
  Administered 2019-12-14: 10 mL via INTRAVENOUS
  Filled 2019-12-14: qty 10

## 2019-12-14 MED ORDER — SODIUM CHLORIDE 0.9 % IV SOLN
Freq: Once | INTRAVENOUS | Status: AC
  Filled 2019-12-14: qty 250

## 2019-12-14 MED ORDER — MIRTAZAPINE 7.5 MG PO TABS: 7.5000 mg | ORAL_TABLET | Freq: Every day | ORAL | 5 refills | Status: DC

## 2019-12-14 MED ORDER — SODIUM CHLORIDE 0.9 % IV SOLN
10.0000 mg | Freq: Once | INTRAVENOUS | Status: AC
  Administered 2019-12-14: 13:00:00 10 mg via INTRAVENOUS
  Filled 2019-12-14: qty 10

## 2019-12-14 MED ORDER — HEPARIN SOD (PORK) LOCK FLUSH 100 UNIT/ML IV SOLN
500.0000 [IU] | Freq: Once | INTRAVENOUS | Status: AC | PRN
  Administered 2019-12-14: 15:00:00 500 [IU] via INTRAVENOUS
  Filled 2019-12-14: qty 5

## 2019-12-14 NOTE — Telephone Encounter (Signed)
Per Dr [name] Blew sent schedule message to get patient scheduled patient for LAB @11 :30 and [facility] (symptom management) @12  today 12/14/19. already approved by Learta Codding and Lucianne Lei. and patient is aware of appointments.

## 2019-12-14 NOTE — Patient Instructions (Signed)

## 2019-12-14 NOTE — Progress Notes (Signed)
[facility] Progress Note   Alec Zettler RN:[id number] [birth year] 74 y.o.  Alec Harris is managed by Dr. Zola Button  Actively treated with chemotherapy/immunotherapy/hormonal therapy: yes  Current therapy: Lupron, Eligard, and Xofigo. Status post palliative radiation therapy to the ribs and sternum between December 7 and December 21.  Next scheduled appointment with provider: 12/29/2019  Assessment: Plan:    Malignant neoplasm of prostate metastatic to bone Alec Harris)  Prostate cancer (Alec Harris)  Dehydration - Plan: 0.9 %  sodium chloride infusion  Anorexia - Plan: dexamethasone (DECADRON) 10 mg in sodium chloride 0.9 % 50 mL IVPB, mirtazapine (REMERON) 7.5 MG tablet  Port-A-Cath in place - Plan: heparin lock flush 100 unit/mL, sodium chloride flush (NS) 0.9 % injection 10 mL  Other insomnia - Plan: mirtazapine (REMERON) 7.5 MG tablet   Prostate cancer with bone metastasis: Alec Harris is followed by Dr. [name] Harris and continues to be treated with Lupron, Eligard, and Xofigo. He is status post palliative radiation therapy to the ribs and sternum between December 7 and December 21. He is scheduled to be seen next on 12/29/2019.  Dehydration: The patient was given 1 L of normal saline IV today.  Anorexia: The patient was given Decadron 10 mg IV x1 today.  He was able to eat a small ham sandwich today.  He was given a prescription for Remeron 7.5 mg p.o. nightly.  Insomnia: I am hopeful that the patient's use of Remeron 7.5 mg p.o. nightly may be beneficial for his insomnia.  Please see After Visit Summary for patient specific instructions.  Future Appointments  Date Time Provider [place]  12/27/2019 12:00 PM [facility] 3 [facility] None  12/27/2019 12:15 PM [facility] [place] FLUSH [facility] None  12/28/2019  2:00 PM Bruning, Ashlyn, PA-C [facility] None  12/29/2019  3:00 PM Alec Portela, MD [facility] None  12/29/2019  3:45 PM [facility] [place] FLUSH [facility]  None  12/30/2019 12:00 PM [facility] LAB [facility] None  01/06/2020 12:00 PM [facility] [place]    No orders of the defined types were placed in this encounter.      Subjective:   Patient ID:  Alec Harris is a 74 y.o. (DOB [birth year]) male.  Chief Complaint:  Chief Complaint  Patient presents with  . Fatigue  . Anorexia    HPI Alec Harris  is a 74 y.o. male with a diagnosis of a metastatic prostate cancer with bone metastasis who is followed by Dr. [name] Harris and continues to be treated with Lupron, Eligard, and Xofigo. He is status post palliative radiation therapy to the ribs and sternum between December 7 and December 21. He was last seen by Dr. [name] Harris on 10/20/2019. Alec Harris called our office earlier today reporting that Alec Harris was eating and drinking little. A CBC, chemistry panel and megnesium level were collected today.  The patient presents with his wife today.  He presents to the clinic with his wife.  She reports that he has been fatigued over the last several weeks.  He is having back pain.  His appetite has been low over the last several weeks.  He has difficulty sleeping and naps during the day.  He sleeps on the couch every night.  She reports that he has not had a bowel movement for "2 weeks."  Medications: I have reviewed the patient's current medications.  Allergies: No Known Allergies  Past Medical History:  Diagnosis Date  . Carotid artery disease ([place])    By doppler  .  Claudication ([place]) 02/06/2012   ABI of 0.5 on both legs ,Rgt ext. iliac70 to 99%,rgt SFA occlude,lft ext.iliac common fem 70 to 99% ,Lft SFA occluded,mono flow popliteal  . Hyperlipemia   . Hypertension   . PAD (peripheral artery disease) ([place]) 01/2012   severe diffuse his iliacs,SFA, carotids  . Prostate cancer ([place])   . Rectal bleeding 07/24/11   Diverticular Bleeding  . Tobacco abuse     Past Surgical History:  Procedure Laterality Date  . CAROTID ANGIOGRAM  03/02/2012    bilateral carotid artery stenosis,95% right and 80% left with a type 2 arch  . CAROTID ANGIOGRAM N/A 03/02/2012   Procedure: CAROTID ANGIOGRAM;  Surgeon: Lorretta Harp, MD;  Location[facility] C[facility] LAB;  Service: Cardiovascular;  Laterality: N/A;  . CAROTID ENDARTERECTOMY Right 04-08-12   CEA  . CAROTID STENT INSERTION Left 07/05/2013   Procedure: CAROTID STENT INSERTION;  Surgeon: Serafina Mitchell, MD;  Location: [facility] CATH LAB;  Service: Cardiovascular;  Laterality: Left;  . CERVICAL SPINE SURGERY  approx. 10 years ago   disc removed from neck  . COLONOSCOPY  07/25/2011   Procedure: COLONOSCOPY;  Surgeon: Beryle Beams;  Location: [facility] ENDOSCOPY;  Service: Endoscopy;  Laterality: N/A;  . DOPPLER ECHOCARDIOGRAPHY  03/24/2012   EF >70% LV normal  . ENDARTERECTOMY  04/08/2012   Procedure: ENDARTERECTOMY CAROTID;  Surgeon: Serafina Mitchell, MD;  Location: [facility] OR;  Service: Vascular;  Laterality: Right;  Right carotid endarterectomy with vascuguard 1cm x 6cm bovine patch angioplasty.   . IR FLUORO GUIDE PORT INSERTION RIGHT  06/05/2017  . IR US GUIDE VASC ACCESS RIGHT  06/05/2017  . KNEE CARTILAGE SURGERY     surgery for a torn ligament  . Lexiscan Myoview  01/14/2012   small fixed basal to mid inferior bowel attenuation artifact. no reversible ischemia  . LOWER EXTREMITY ANGIOGRAM N/A 03/02/2012   Procedure: LOWER EXTREMITY ANGIOGRAM;  Surgeon: Lorretta Harp, MD;  Location: [facility] CATH LAB;  Service: Cardiovascular;  Laterality: N/A;  . PENILE PROSTHESIS IMPLANT    . PROSTATE SURGERY  2003   for  prostate cancer  . PV angiogram  03/02/2012   total SFAs bilaterally with 3- vessel runoff below the knee, high-grade calcified iliac diseaese    Family History  Problem Relation Age of Onset  . Hypertension Mother   . Heart attack Mother   . Heart disease Mother        CHF/  After age 39  . Heart attack Father   . Heart disease Father        Irregular Heart beat, Heart Disease before age 7  . Cancer  Brother        throat  . Breast cancer Neg Hx   . Colon cancer Neg Hx   . Prostate cancer Neg Hx     Social History   Socioeconomic History  . Marital status: Married    Spouse name: Not on file  . Number of children: Not on file  . Years of education: Not on file  . Highest education level: Not on file  Occupational History  . Not on file  Tobacco Use  . Smoking status: Current Every Day Smoker    Years: 50.00    Types: Cigarettes  . Smokeless tobacco: Never Used  . Tobacco comment: pt states that he is going to quit completely; 1/2 pack per day  Substance and Sexual Activity  . Alcohol use: Not Currently  . Drug use: No  .  Sexual activity: Not Currently    Birth control/protection: None  Other Topics Concern  . Not on file  Social History Narrative  . Not on file   Social Determinants of Harris   Financial Resource Strain:   . Difficulty of Paying Living Expenses:   Food Insecurity:   . Worried About Charity fundraiser in the Last Year:   . Arboriculturist in the Last Year:   Transportation Needs:   . Film/video editor (Medical):   [name] Lack of Transportation (Non-Medical):   Physical Activity:   . Days of Exercise per Week:   . Minutes of Exercise per Session:   Stress:   . Feeling of Stress :   Social Connections:   . Frequency of Communication with Friends and Family:   . Frequency of Social Gatherings with Friends and Family:   . Attends Religious Services:   . Active Member of Clubs or Organizations:   . Attends Archivist Meetings:   [name] Marital Status:   Intimate Partner Violence:   . Fear of Current or Ex-Partner:   . Emotionally Abused:   [name] Physically Abused:   . Sexually Abused:     Past Medical History, Surgical history, Social history, and Family history were reviewed and updated as appropriate.   Please see review of systems for further details on the patient's review from today.   Review of Systems:  Review of Systems   Constitutional: Positive for activity change, appetite change and fatigue. Negative for chills, diaphoresis and fever.  HENT: Negative for trouble swallowing.   Respiratory: Negative for cough, chest tightness and shortness of breath.   Cardiovascular: Negative for chest pain and palpitations.  Gastrointestinal: Positive for constipation. Negative for abdominal pain, diarrhea, nausea and vomiting.  Musculoskeletal: Positive for back pain.  Neurological: Negative for dizziness and headaches.  Psychiatric/Behavioral: Positive for sleep disturbance.    Objective:   Physical Exam:  BP (!) 123/55 Comment: Pa Biance Moncrief aware of all VS  Pulse 98   Temp 99.1 F (37.3 C) (Temporal)   Resp 16   Ht 5\' 9"  (1.753 m)   SpO2 100%   BMI 24.85 kg/m  ECOG: 1  Physical Exam Constitutional:      General: He is not in acute distress.    Appearance: He is not diaphoretic.  HENT:     Head: Normocephalic and atraumatic.  Cardiovascular:     Rate and Rhythm: Normal rate and regular rhythm.     Heart sounds: Normal heart sounds. No murmur. No friction rub. No gallop.   Pulmonary:     Effort: Pulmonary effort is normal. No respiratory distress.     Breath sounds: Normal breath sounds. No wheezing or rales.  Abdominal:     General: Abdomen is flat. Bowel sounds are normal. There is no distension.     Palpations: Abdomen is soft.     Tenderness: There is no abdominal tenderness. There is no guarding or rebound.  Skin:    General: Skin is warm and dry.     Findings: No erythema or rash.  Neurological:     Mental Status: He is alert.     Gait: Gait abnormal (The patient is ambulating with the use of a wheelchair.).     Lab Review:     Component Value Date/Time   NA 132 (L) 12/14/2019 1159   NA 137 08/05/2017 0839   K 4.3 12/14/2019 1159   K 3.9 08/05/2017 [id number]  CL 103 12/14/2019 1159   CL 106 02/01/2013 1120   CO2 19 (L) 12/14/2019 1159   CO2 23 08/05/2017 0839   GLUCOSE 100 (H) 12/14/2019  1159   GLUCOSE 159 (H) 08/05/2017 0839   GLUCOSE 130 (H) 02/01/2013 1120   BUN 13 12/14/2019 1159   BUN 12.2 08/05/2017 0839   CREATININE 0.70 12/14/2019 1159   CREATININE 0.9 08/05/2017 0839   CALCIUM 8.4 (L) 12/14/2019 1159   CALCIUM 8.6 08/05/2017 0839   PROT 6.2 (L) 12/14/2019 1159   PROT 6.7 04/15/2019 1032   PROT 6.6 08/05/2017 0839   ALBUMIN 3.1 (L) 12/14/2019 1159   ALBUMIN 3.9 04/15/2019 1032   ALBUMIN 3.2 (L) 08/05/2017 0839   AST 19 12/14/2019 1159   AST 7 08/05/2017 0839   ALT 15 12/14/2019 1159   ALT <6 08/05/2017 0839   ALKPHOS 965 (H) 12/14/2019 1159   ALKPHOS 146 08/05/2017 0839   BILITOT 1.5 (H) 12/14/2019 1159   BILITOT 0.46 08/05/2017 0839   GFRNONAA >60 12/14/2019 1159   GFRAA >60 12/14/2019 1159       Component Value Date/Time   WBC 4.7 12/14/2019 1159   WBC 6.2 07/09/2019 1142   RBC 2.74 (L) 12/14/2019 1159   HGB 9.0 (L) 12/14/2019 1159   HGB 11.4 (L) 08/05/2017 0839   HCT 27.3 (L) 12/14/2019 1159   HCT 35.1 (L) 08/05/2017 0839   PLT 51 (L) 12/14/2019 1159   PLT 264 08/05/2017 0839   MCV 99.6 12/14/2019 1159   MCV 91.4 08/05/2017 0839   MCH 32.8 12/14/2019 1159   MCHC 33.0 12/14/2019 1159   RDW 19.3 (H) 12/14/2019 1159   RDW 17.9 (H) 08/05/2017 0839   LYMPHSABS 0.5 (L) 12/14/2019 1159   LYMPHSABS 1.6 08/05/2017 0839   MONOABS 0.5 12/14/2019 1159   MONOABS 1.3 (H) 08/05/2017 0839   EOSABS 0.0 12/14/2019 1159   EOSABS 0.0 08/05/2017 0839   BASOSABS 0.0 12/14/2019 1159   BASOSABS 0.1 08/05/2017 0839   -------------------------------  Imaging from last 24 hours (if applicable):  Radiology interpretation: NM XOFIGO INJECTION  Result Date: 12/09/2019  Trudi Ida was injected intravenously in Nuclear Medicine under the supervision of the attending radiologist

## 2019-12-14 NOTE — Telephone Encounter (Signed)
Wife called earlier about concern of patient not eating or drinking much. Patient being seen in [facility] today.

## 2019-12-14 NOTE — Progress Notes (Signed)
Pt received 1L IVF NS and IVPB decadron, tolerated well.  VSS.  Pt able to eat & drink during infusion w/out any issues, declined to use restroom.  Pt/wife aware to f/u as needed with any questions/concerns.

## 2019-12-17 DIAGNOSIS — R531 Weakness: Secondary | ICD-10-CM

## 2019-12-17 HISTORY — DX: Weakness: R53.1

## 2019-12-27 ENCOUNTER — Ambulatory Visit: Payer: Medicare Other

## 2019-12-27 ENCOUNTER — Telehealth: Payer: Self-pay

## 2019-12-27 ENCOUNTER — Encounter (HOSPITAL_COMMUNITY): Payer: Self-pay | Admitting: Emergency Medicine

## 2019-12-27 ENCOUNTER — Other Ambulatory Visit: Payer: Self-pay | Admitting: Urology

## 2019-12-27 ENCOUNTER — Inpatient Hospital Stay: Payer: Medicare Other | Attending: Oncology

## 2019-12-27 ENCOUNTER — Inpatient Hospital Stay: Payer: Medicare Other

## 2019-12-27 ENCOUNTER — Other Ambulatory Visit: Payer: Self-pay | Admitting: Radiation Therapy

## 2019-12-27 ENCOUNTER — Inpatient Hospital Stay (HOSPITAL_COMMUNITY)
Admission: EM | Admit: 2019-12-27 | Discharge: 2020-01-03 | DRG: 543 | Disposition: A | Discharge status: Hospice | Payer: Medicare Other | Attending: Internal Medicine | Admitting: Internal Medicine

## 2019-12-27 ENCOUNTER — Other Ambulatory Visit: Payer: Self-pay

## 2019-12-27 ENCOUNTER — Other Ambulatory Visit: Payer: Self-pay | Admitting: Radiation Oncology

## 2019-12-27 ENCOUNTER — Encounter: Payer: Self-pay | Admitting: Urology

## 2019-12-27 ENCOUNTER — Emergency Department (HOSPITAL_COMMUNITY): Payer: Medicare Other

## 2019-12-27 ENCOUNTER — Ambulatory Visit
Admission: RE | Admit: 2019-12-27 | Discharge: 2019-12-27 | Disposition: A | Discharge status: Home / Self Care | Payer: Medicare Other | Source: Ambulatory Visit | Attending: Radiation Oncology | Admitting: Radiation Oncology

## 2019-12-27 VITALS — BP 105/61 | HR 115 | Temp 98.7°F | Resp 18 | Ht 69.0 in

## 2019-12-27 DIAGNOSIS — C61 Malignant neoplasm of prostate: Secondary | ICD-10-CM

## 2019-12-27 DIAGNOSIS — Z66 Do not resuscitate: Secondary | ICD-10-CM | POA: Diagnosis present

## 2019-12-27 DIAGNOSIS — Z20822 Contact with and (suspected) exposure to covid-19: Secondary | ICD-10-CM | POA: Diagnosis present

## 2019-12-27 DIAGNOSIS — K5900 Constipation, unspecified: Secondary | ICD-10-CM | POA: Diagnosis not present

## 2019-12-27 DIAGNOSIS — I70223 Atherosclerosis of native arteries of extremities with rest pain, bilateral legs: Secondary | ICD-10-CM | POA: Diagnosis present

## 2019-12-27 DIAGNOSIS — E440 Moderate protein-calorie malnutrition: Secondary | ICD-10-CM | POA: Diagnosis present

## 2019-12-27 DIAGNOSIS — L98429 Non-pressure chronic ulcer of back with unspecified severity: Secondary | ICD-10-CM | POA: Diagnosis present

## 2019-12-27 DIAGNOSIS — E785 Hyperlipidemia, unspecified: Secondary | ICD-10-CM | POA: Diagnosis present

## 2019-12-27 DIAGNOSIS — I6523 Occlusion and stenosis of bilateral carotid arteries: Secondary | ICD-10-CM | POA: Diagnosis present

## 2019-12-27 DIAGNOSIS — C7949 Secondary malignant neoplasm of other parts of nervous system: Secondary | ICD-10-CM

## 2019-12-27 DIAGNOSIS — F17210 Nicotine dependence, cigarettes, uncomplicated: Secondary | ICD-10-CM | POA: Diagnosis present

## 2019-12-27 DIAGNOSIS — C7951 Secondary malignant neoplasm of bone: Secondary | ICD-10-CM | POA: Insufficient documentation

## 2019-12-27 DIAGNOSIS — I10 Essential (primary) hypertension: Secondary | ICD-10-CM | POA: Diagnosis present

## 2019-12-27 DIAGNOSIS — G893 Neoplasm related pain (acute) (chronic): Secondary | ICD-10-CM | POA: Insufficient documentation

## 2019-12-27 DIAGNOSIS — Z8546 Personal history of malignant neoplasm of prostate: Secondary | ICD-10-CM

## 2019-12-27 DIAGNOSIS — D61818 Other pancytopenia: Secondary | ICD-10-CM | POA: Diagnosis present

## 2019-12-27 DIAGNOSIS — M2550 Pain in unspecified joint: Secondary | ICD-10-CM | POA: Diagnosis not present

## 2019-12-27 DIAGNOSIS — M4804 Spinal stenosis, thoracic region: Secondary | ICD-10-CM | POA: Diagnosis present

## 2019-12-27 DIAGNOSIS — Z7189 Other specified counseling: Secondary | ICD-10-CM | POA: Diagnosis not present

## 2019-12-27 DIAGNOSIS — Z03818 Encounter for observation for suspected exposure to other biological agents ruled out: Secondary | ICD-10-CM | POA: Diagnosis not present

## 2019-12-27 DIAGNOSIS — Z6823 Body mass index (BMI) 23.0-23.9, adult: Secondary | ICD-10-CM

## 2019-12-27 DIAGNOSIS — G9529 Other cord compression: Secondary | ICD-10-CM | POA: Diagnosis present

## 2019-12-27 DIAGNOSIS — I739 Peripheral vascular disease, unspecified: Secondary | ICD-10-CM | POA: Diagnosis present

## 2019-12-27 DIAGNOSIS — Z7401 Bed confinement status: Secondary | ICD-10-CM

## 2019-12-27 DIAGNOSIS — M898X9 Other specified disorders of bone, unspecified site: Secondary | ICD-10-CM | POA: Insufficient documentation

## 2019-12-27 DIAGNOSIS — L8990 Pressure ulcer of unspecified site, unspecified stage: Secondary | ICD-10-CM | POA: Diagnosis present

## 2019-12-27 DIAGNOSIS — Z515 Encounter for palliative care: Secondary | ICD-10-CM | POA: Diagnosis not present

## 2019-12-27 DIAGNOSIS — C7952 Secondary malignant neoplasm of bone marrow: Secondary | ICD-10-CM | POA: Insufficient documentation

## 2019-12-27 DIAGNOSIS — Z9221 Personal history of antineoplastic chemotherapy: Secondary | ICD-10-CM | POA: Diagnosis not present

## 2019-12-27 DIAGNOSIS — E559 Vitamin D deficiency, unspecified: Secondary | ICD-10-CM | POA: Diagnosis present

## 2019-12-27 DIAGNOSIS — L89159 Pressure ulcer of sacral region, unspecified stage: Secondary | ICD-10-CM | POA: Diagnosis not present

## 2019-12-27 DIAGNOSIS — Z7982 Long term (current) use of aspirin: Secondary | ICD-10-CM

## 2019-12-27 DIAGNOSIS — Z9079 Acquired absence of other genital organ(s): Secondary | ICD-10-CM

## 2019-12-27 DIAGNOSIS — Z923 Personal history of irradiation: Secondary | ICD-10-CM | POA: Diagnosis not present

## 2019-12-27 DIAGNOSIS — R52 Pain, unspecified: Secondary | ICD-10-CM

## 2019-12-27 DIAGNOSIS — R29898 Other symptoms and signs involving the musculoskeletal system: Secondary | ICD-10-CM

## 2019-12-27 DIAGNOSIS — D497 Neoplasm of unspecified behavior of endocrine glands and other parts of nervous system: Secondary | ICD-10-CM | POA: Diagnosis not present

## 2019-12-27 DIAGNOSIS — Z8249 Family history of ischemic heart disease and other diseases of the circulatory system: Secondary | ICD-10-CM

## 2019-12-27 DIAGNOSIS — Z79818 Long term (current) use of other agents affecting estrogen receptors and estrogen levels: Secondary | ICD-10-CM | POA: Insufficient documentation

## 2019-12-27 DIAGNOSIS — Z808 Family history of malignant neoplasm of other organs or systems: Secondary | ICD-10-CM

## 2019-12-27 DIAGNOSIS — I6529 Occlusion and stenosis of unspecified carotid artery: Secondary | ICD-10-CM | POA: Diagnosis present

## 2019-12-27 DIAGNOSIS — R410 Disorientation, unspecified: Secondary | ICD-10-CM | POA: Diagnosis not present

## 2019-12-27 DIAGNOSIS — I708 Atherosclerosis of other arteries: Secondary | ICD-10-CM | POA: Diagnosis present

## 2019-12-27 DIAGNOSIS — R531 Weakness: Secondary | ICD-10-CM | POA: Diagnosis not present

## 2019-12-27 LAB — CBC WITH DIFFERENTIAL/PLATELET
Abs Immature Granulocytes: 0.06 10*3/uL (ref 0.00–0.07)
Basophils Absolute: 0 10*3/uL (ref 0.0–0.1)
Basophils Relative: 0 %
Eosinophils Absolute: 0 10*3/uL (ref 0.0–0.5)
Eosinophils Relative: 0 %
HCT: 24.7 % — ABNORMAL LOW (ref 39.0–52.0)
Hemoglobin: 8 g/dL — ABNORMAL LOW (ref 13.0–17.0)
Immature Granulocytes: 2 %
Lymphocytes Relative: 10 %
Lymphs Abs: 0.4 10*3/uL — ABNORMAL LOW (ref 0.7–4.0)
MCH: 33.6 pg (ref 26.0–34.0)
MCHC: 32.4 g/dL (ref 30.0–36.0)
MCV: 103.8 fL — ABNORMAL HIGH (ref 80.0–100.0)
Monocytes Absolute: 0.4 10*3/uL (ref 0.1–1.0)
Monocytes Relative: 12 %
Neutro Abs: 2.8 10*3/uL (ref 1.7–7.7)
Neutrophils Relative %: 76 %
Platelets: 28 10*3/uL — CL (ref 150–400)
RBC: 2.38 MIL/uL — ABNORMAL LOW (ref 4.22–5.81)
RDW: 20 % — ABNORMAL HIGH (ref 11.5–15.5)
WBC: 3.7 10*3/uL — ABNORMAL LOW (ref 4.0–10.5)
nRBC: 1.9 % — ABNORMAL HIGH (ref 0.0–0.2)

## 2019-12-27 LAB — COMPREHENSIVE METABOLIC PANEL
ALT: 14 U/L (ref 0–44)
AST: 17 U/L (ref 15–41)
Albumin: 2.7 g/dL — ABNORMAL LOW (ref 3.5–5.0)
Alkaline Phosphatase: 605 U/L — ABNORMAL HIGH (ref 38–126)
Anion gap: 11 (ref 5–15)
BUN: 13 mg/dL (ref 8–23)
CO2: 21 mmol/L — ABNORMAL LOW (ref 22–32)
Calcium: 8 mg/dL — ABNORMAL LOW (ref 8.9–10.3)
Chloride: 102 mmol/L (ref 98–111)
Creatinine, Ser: 0.64 mg/dL (ref 0.61–1.24)
GFR calc Af Amer: >60 mL/min (ref >60)
GFR calc non Af Amer: >60 mL/min (ref >60)
Glucose, Bld: 111 mg/dL — ABNORMAL HIGH (ref 70–99)
Potassium: 3.9 mmol/L (ref 3.5–5.1)
Sodium: 134 mmol/L — ABNORMAL LOW (ref 135–145)
Total Bilirubin: 1.7 mg/dL — ABNORMAL HIGH (ref 0.3–1.2)
Total Protein: 5.5 g/dL — ABNORMAL LOW (ref 6.5–8.1)

## 2019-12-27 LAB — SARS CORONAVIRUS 2 BY RT PCR (HOSPITAL ORDER, PERFORMED IN ~~LOC~~ HOSPITAL LAB): SARS Coronavirus 2: NEGATIVE

## 2019-12-27 MED ORDER — HYDROCODONE-ACETAMINOPHEN 5-325 MG PO TABS
1.0000 | ORAL_TABLET | Freq: Three times a day (TID) | ORAL | Status: DC | PRN
  Administered 2019-12-30: 1 via ORAL
  Filled 2019-12-27: qty 1

## 2019-12-27 MED ORDER — SIMETHICONE 40 MG/0.6ML PO SUSP
40.0000 mg | Freq: Once | ORAL | Status: AC
  Administered 2019-12-27: 40 mg via ORAL
  Filled 2019-12-27: qty 0.6

## 2019-12-27 MED ORDER — MEGESTROL ACETATE 400 MG/10ML PO SUSP
400.0000 mg | Freq: Every day | ORAL | Status: DC
  Administered 2019-12-28 – 2020-01-03 (×6): 400 mg via ORAL
  Filled 2019-12-27 (×7): qty 10

## 2019-12-27 MED ORDER — DEXAMETHASONE SODIUM PHOSPHATE 4 MG/ML IJ SOLN
4.0000 mg | Freq: Four times a day (QID) | INTRAMUSCULAR | Status: DC
  Administered 2019-12-28 – 2020-01-03 (×27): 4 mg via INTRAVENOUS
  Filled 2019-12-27 (×27): qty 1

## 2019-12-27 MED ORDER — ATORVASTATIN CALCIUM 20 MG PO TABS
20.0000 mg | ORAL_TABLET | Freq: Every day | ORAL | Status: DC
  Administered 2019-12-28 – 2020-01-03 (×7): 20 mg via ORAL
  Filled 2019-12-27 (×2): qty 1
  Filled 2019-12-27: qty 2
  Filled 2019-12-27 (×4): qty 1

## 2019-12-27 MED ORDER — ASPIRIN EC 81 MG PO TBEC: 81.0000 mg | DELAYED_RELEASE_TABLET | Freq: Every day | ORAL | Status: DC

## 2019-12-27 MED ORDER — AMLODIPINE BESYLATE 5 MG PO TABS
5.0000 mg | ORAL_TABLET | Freq: Every day | ORAL | Status: DC
  Administered 2019-12-28 – 2020-01-02 (×6): 5 mg via ORAL
  Filled 2019-12-27 (×6): qty 1

## 2019-12-27 MED ORDER — CILOSTAZOL 100 MG PO TABS
100.0000 mg | ORAL_TABLET | Freq: Two times a day (BID) | ORAL | Status: DC
  Administered 2019-12-28 – 2019-12-29 (×4): 100 mg via ORAL
  Filled 2019-12-27 (×5): qty 1

## 2019-12-27 MED ORDER — GADOBUTROL 1 MMOL/ML IV SOLN
6.0000 mL | Freq: Once | INTRAVENOUS | Status: AC | PRN
  Administered 2019-12-27: 7.5 mL via INTRAVENOUS

## 2019-12-27 MED ORDER — MIRTAZAPINE 15 MG PO TABS
7.5000 mg | ORAL_TABLET | Freq: Every day | ORAL | Status: DC
  Administered 2019-12-28 – 2020-01-02 (×7): 7.5 mg via ORAL
  Filled 2019-12-27 (×7): qty 1

## 2019-12-27 NOTE — ED Notes (Signed)
Pt to MRI

## 2019-12-27 NOTE — Telephone Encounter (Signed)
Patient is coming in for a visit with approval of PA/MD for a visit due to change in condition

## 2019-12-27 NOTE — ED Notes (Signed)
Pt is refusing to wear VS monitors at this time.

## 2019-12-27 NOTE — H&P (Signed)
History and Physical    Tywain Sulkowski [id number] DOB: 1945/10/14 DOA: 12/27/2019  PCP: Patient, No Pcp Per  Patient coming from: Home  I have personally briefly reviewed patient's old medical records in [place]  Chief Complaint: bilateral lower extremity weakness  HPI: Dreydan Sotto is a 74 y.o. male with medical history significant for prostate cancer metastasis to bone on palliative radiation, PVD, carotid stenosis and hyperlipidemia who presents at the recommendation of his radiation oncologist for concerns of bilateral lower extremity weakness.   Patient overall is a poor historian does not family at bedside.  He reports that for the past month he has been unable to move his legs.  He normally ambulates with a walker but has been more bedbound since he has been unable to move due to weakness of his bilateral lower extremity.  He requires the assistance of his wife and son.  He denies any bowel or bladder incontinence.  Reports intact sensation.  ED Course:  He was afebrile, mildly tachycardic normotensive on room air. Lab work shows worsening pancytopenia.  MRI of total spine showed widespread metastatic disease throughout the cervical, thoracic and lumbar spine with epidural tumor surrounding the cord at T7-T9 causing cord deformity and mild spinal stenosis.  Neurosurgery has evaluated patient and recommended that he be transferred to [facility].  They could potentially do a multilevel thoracic laminectomy would like to discuss with rad-onc first.  Will start scheduled steroid for now.  Review of Systems:  Constitutional: No Weight Change, No Fever ENT/Mouth: No sore throat, No Rhinorrhea Eyes: No Eye Pain, No Vision Changes Cardiovascular: No Chest Pain, no SOB Respiratory: No Cough, No Sputum  Gastrointestinal: No Nausea, No Vomiting, No Diarrhea, No Constipation, No Pain Genitourinary: no Urinary Incontinence,  Musculoskeletal: No Arthralgias, No Myalgias Skin: No  Skin Lesions, No Pruritus, Neuro: + Weakness, No Numbness Psych: No Anxiety/Panic, No Depression, no decrease appetite Heme/Lymph: No Bruising, No Bleeding  Past Medical History:  Diagnosis Date  . Carotid artery disease ([place])    By doppler  . Claudication ([place]) 02/06/2012   ABI of 0.5 on both legs ,Rgt ext. iliac70 to 99%,rgt SFA occlude,lft ext.iliac common fem 70 to 99% ,Lft SFA occluded,mono flow popliteal  . Hyperlipemia   . Hypertension   . PAD (peripheral artery disease) ([place]) 01/2012   severe diffuse his iliacs,SFA, carotids  . Prostate cancer ([place])   . Rectal bleeding 07/24/11   Diverticular Bleeding  . Tobacco abuse     Past Surgical History:  Procedure Laterality Date  . CAROTID ANGIOGRAM  03/02/2012   bilateral carotid artery stenosis,95% right and 80% left with a type 2 arch  . CAROTID ANGIOGRAM N/A 03/02/2012   Procedure: CAROTID ANGIOGRAM;  Surgeon: Lorretta Harp, MD;  Location: [facility] CATH LAB;  Service: Cardiovascular;  Laterality: N/A;  . CAROTID ENDARTERECTOMY Right 04-08-12   CEA  . CAROTID STENT INSERTION Left 07/05/2013   Procedure: CAROTID STENT INSERTION;  Surgeon: Serafina Mitchell, MD;  Location: [facility] CATH LAB;  Service: Cardiovascular;  Laterality: Left;  . CERVICAL SPINE SURGERY  approx. 10 years ago   disc removed from neck  . COLONOSCOPY  07/25/2011   Procedure: COLONOSCOPY;  Surgeon: Beryle Beams;  Location: [facility] ENDOSCOPY;  Service: Endoscopy;  Laterality: N/A;  . DOPPLER ECHOCARDIOGRAPHY  03/24/2012   EF >70% LV normal  . ENDARTERECTOMY  04/08/2012   Procedure: ENDARTERECTOMY CAROTID;  Surgeon: Serafina Mitchell, MD;  Location: [place];  Service: Vascular;  Laterality: Right;  Right carotid endarterectomy with vascuguard 1cm x 6cm bovine patch angioplasty.   . IR FLUORO GUIDE PORT INSERTION RIGHT  06/05/2017  . IR US GUIDE VASC ACCESS RIGHT  06/05/2017  . KNEE CARTILAGE SURGERY     surgery for a torn ligament  . Lexiscan Myoview  01/14/2012   small fixed  basal to mid inferior bowel attenuation artifact. no reversible ischemia  . LOWER EXTREMITY ANGIOGRAM N/A 03/02/2012   Procedure: LOWER EXTREMITY ANGIOGRAM;  Surgeon: Lorretta Harp, MD;  Location: [facility] CATH LAB;  Service: Cardiovascular;  Laterality: N/A;  . PENILE PROSTHESIS IMPLANT    . PROSTATE SURGERY  2003   for  prostate cancer  . PV angiogram  03/02/2012   total SFAs bilaterally with 3- vessel runoff below the knee, high-grade calcified iliac diseaese     reports that he has been smoking cigarettes. He has smoked for the past 50.00 years. He has never used smokeless tobacco. He reports previous alcohol use. He reports that he does not use drugs.  No Known Allergies  Family History  Problem Relation Age of Onset  . Hypertension Mother   . Heart attack Mother   . Heart disease Mother        CHF/  After age 44  . Heart attack Father   . Heart disease Father        Irregular Heart beat, Heart Disease before age 25  . Cancer Brother        throat  . Breast cancer Neg Hx   . Colon cancer Neg Hx   . Prostate cancer Neg Hx      Prior to Admission medications   Medication Sig Start Date End Date Taking? Authorizing Provider  amLODipine (NORVASC) 5 MG tablet TAKE 1 TABLET (5 MG TOTAL) BY MOUTH DAILY. 04/28/19  Yes Lorretta Harp, MD  aspirin EC 81 MG tablet Take 81 mg by mouth daily.   Yes [provider]  atorvastatin (LIPITOR) 20 MG tablet TAKE 1 TABLET BY MOUTH EVERY DAY Patient taking differently: Take 20 mg by mouth daily.  05/18/19  Yes Lorretta Harp, MD  cilostazol (PLETAL) 100 MG tablet TAKE 1 TABLET BY MOUTH TWICE A DAY Patient taking differently: Take 100 mg by mouth 2 (two) times daily.  05/09/19  Yes Lorretta Harp, MD  HYDROcodone-acetaminophen (NORCO/VICODIN) 5-325 MG tablet Take 1 tablet by mouth every 8 (eight) hours as needed for moderate pain. 10/31/19  Yes [name], MD  lidocaine-prilocaine (EMLA) cream Apply 1 application topically as  needed. 05/21/17  Yes [name], MD  megestrol (MEGACE) 40 MG/ML suspension TAKE 10 MLS (400 MG TOTAL) BY MOUTH DAILY. Patient taking differently: Take 400 mg by mouth daily.  11/21/19  Yes [name], MD  mirtazapine (REMERON) 7.5 MG tablet Take 1 tablet (7.5 mg total) by mouth at bedtime. 12/14/19  Yes Tanner, Lyndon Code., PA-C  oxyCODONE (OXY IR/ROXICODONE) 5 MG immediate release tablet Take 1 tablet (5 mg total) by mouth every 4 (four) hours as needed for severe pain. Patient not taking: Reported on 12/27/2019 11/01/19   [name], MD  traMADol (ULTRAM) 50 MG tablet Take 1 tablet (50 mg total) by mouth every 6 (six) hours as needed. Patient not taking: Reported on 12/27/2019 06/01/19   [name], MD    Physical Exam: Vitals:   12/27/19 1700 12/27/19 1730 12/27/19 1800 12/27/19 2009  BP: 137/69 (!) 151/73 139/73 126/69  Pulse: [name])  102  100 (!) 102  Resp: 13  18 18   Temp:      TempSrc:      SpO2: 93%  96% 99%  Weight:      Height:        Constitutional: NAD, calm, comfortable, thin elderly male laying asleep in bed Vitals:   12/27/19 1700 12/27/19 1730 12/27/19 1800 12/27/19 2009  BP: 137/69 (!) 151/73 139/73 126/69  Pulse: (!) 102  100 (!) 102  Resp: 13  18 18   Temp:      TempSrc:      SpO2: 93%  96% 99%  Weight:      Height:       Eyes: PERRL, lids and conjunctivae normal ENMT: Mucous membranes are moist.  Neck: normal, supple Respiratory: clear to auscultation bilaterally, no wheezing, no crackles. Normal respiratory effort on room air.   Cardiovascular: Regular rate and rhythm, no murmurs / rubs / gallops. No extremity edema. 2+ pedal pulses.  Abdomen: no tenderness, no masses palpated.  Bowel sounds positive.  Musculoskeletal: no clubbing / cyanosis. No joint deformity upper and lower extremities.  Skin: no rashes, lesions, ulcers. No induration Neurologic: CN 2-12 grossly intact. Sensation intact.  Patient only able to do minimal active flexion of  bilateral knees.  Strength of 2 out of 5 of the bilateral lower extremity.  Difficulty eliciting patellar reflexes. Psychiatric: Normal judgment and insight. Alert and oriented x 3. Normal mood.     Labs on Admission: I have personally reviewed following labs and imaging studies  CBC: Recent Labs  Lab 12/27/19 1326  WBC 3.7*  NEUTROABS 2.8  HGB 8.0*  HCT 24.7*  MCV 103.8*  PLT 28*   Basic Metabolic Panel: Recent Labs  Lab 12/27/19 1326  NA 134*  K 3.9  CL 102  CO2 21*  GLUCOSE 111*  BUN 13  CREATININE 0.64  CALCIUM 8.0*   GFR: Estimated Creatinine Clearance: 78.6 mL/min (by C-G formula based on SCr of 0.64 mg/dL). Liver Function Tests: Recent Labs  Lab 12/27/19 1326  AST 17  ALT 14  ALKPHOS 605*  BILITOT 1.7*  PROT 5.5*  ALBUMIN 2.7*   No results for input(s): LIPASE, AMYLASE in the last 168 hours. No results for input(s): AMMONIA in the last 168 hours. Coagulation Profile: No results for input(s): INR, PROTIME in the last 168 hours. Cardiac Enzymes: No results for input(s): CKTOTAL, CKMB, CKMBINDEX, TROPONINI in the last 168 hours. BNP (last 3 results) No results for input(s): PROBNP in the last 8760 hours. HbA1C: No results for input(s): HGBA1C in the last 72 hours. CBG: No results for input(s): GLUCAP in the last 168 hours. Lipid Profile: No results for input(s): CHOL, HDL, LDLCALC, TRIG, CHOLHDL, LDLDIRECT in the last 72 hours. Thyroid Function Tests: No results for input(s): TSH, T4TOTAL, FREET4, T3FREE, THYROIDAB in the last 72 hours. Anemia Panel: No results for input(s): VITAMINB12, FOLATE, FERRITIN, TIBC, IRON, RETICCTPCT in the last 72 hours. Urine analysis:    Component Value Date/Time   COLORURINE YELLOW 04/02/2012 0832   APPEARANCEUR CLEAR 04/02/2012 0832   LABSPEC 1.020 07/09/2019 1123   PHURINE 6.5 07/09/2019 1123   GLUCOSEU NEGATIVE 07/09/2019 1123   HGBUR NEGATIVE 07/09/2019 [place] 07/09/2019 [place] 07/09/2019 1123   PROTEINUR NEGATIVE 07/09/2019 1123   UROBILINOGEN 0.2 07/09/2019 1123   NITRITE NEGATIVE 07/09/2019 1123   LEUKOCYTESUR NEGATIVE 07/09/2019 1123    Radiological Exams on Admission: MR TOTAL SPINE METS  SCREENING  Result Date: 12/27/2019 CLINICAL DATA:  Myelopathy. Metastatic prostate cancer. Increased weakness and numbness of legs. EXAM: MRI TOTAL SPINE WITHOUT AND WITH CONTRAST TECHNIQUE: Multisequence MR imaging of the spine from the cervical spine to the sacrum was performed prior to and following IV contrast administration for evaluation of spinal metastatic disease. CONTRAST:  7.92mL GADAVIST GADOBUTROL 1 MMOL/ML IV SOLN COMPARISON:  None. FINDINGS: MRI CERVICAL SPINE FINDINGS Alignment: Normal Vertebrae: ACDF with anterior plate at 075-GRM. Metastatic disease C5, C6, and C7 vertebral bodies extending into the posterior elements. Negative for fracture Cord: Hyperintensity in the cord at C3-4 compatible with chronic myelomalacia. This may be related to prior spinal stenosis. Spinal canal is adequate in size at this time. Posterior Fossa, vertebral arteries, paraspinal tissues: Negative Disc levels: Disc degeneration and spurring  C5-6 and C6-7.  No cord compression. Image quality degraded by motion throughout all of the study. MRI THORACIC SPINE FINDINGS Alignment:  Normal Vertebrae: Widespread metastatic disease throughout the vertebral bodies and posterior elements of the entire thoracic spine. There is enhancing epidural tumor in the canal at T7 to T9 causing cord deformity and spinal stenosis. No frank cord compression. Limited anatomic detail due to motion. Cord: Normal cord signal. Epidural tumor surrounding the cord T7 through T9 Paraspinal and other soft tissues: No paraspinous mass. Bibasilar atelectasis. Disc levels: Limited evaluation of the disc spaces. There is mild disc degeneration in the thoracic spine without disc protrusion identified. MRI LUMBAR SPINE  FINDINGS Segmentation: L5 is sacralized. There are 4 non-rib-bearing lumbar segments. Alignment:  Normal Vertebrae: Widespread metastatic disease throughout the vertebral bodies and posterior elements throughout the lumbar spine extending into the thoracic spine. There is a mild fracture of L1 which appears chronic. Conus medullaris: Extends to the L1-2 level and appears normal. Paraspinal and other soft tissues: Negative Disc levels: Mild disc degeneration at multiple levels. No disc protrusion or spinal stenosis. No epidural tumor in the lumbar canal. IMPRESSION: This study is degraded by significant motion Widespread metastatic disease throughout the cervical, thoracic, and lumbar spine. Epidural tumor in the canal surrounding the cord at T7 through T9 causing cord deformity and mild spinal stenosis. Chronic myelomalacia in the cervical spinal cord at C3-4 at the site of prior ACDF. Electronically Signed   By: Franchot Gallo M.D.   On: 12/27/2019 19:27      Assessment/Plan  BLE weakness with metastatic bone disease and new epidural tumor around cord of T7-T9 Neurology has evaluated recommend [facility] to [facility].  Patient could potentially go for multilevel thoracic laminectomy tomorrow but neurosurgeon would like to discuss with rad-onc about potential radiation and so treatment first. Continue Decadron 4 mg every 6 hours as recommended Frequent neuro checks  Hypertension Continue amlodipine  Hyperlipidemia Continue statin  PVD Continue aspirin and Cilostazol  DVT prophylaxis:SCDs Code Status: DNR Family Communication: Plan discussed with patient at bedside  disposition Plan: Home with at least 2 midnight stays  Consults called:  Admission status: inpatient  Status is: Inpatient  Remains inpatient appropriate because:Inpatient level of care appropriate due to severity of illness   Dispo: The patient is from: Home              Anticipated d/c is to: Home               Anticipated d/c date is: 3 days              Patient currently is not medically stable to d/c.  Orene Desanctis DO [facility]   If 7PM-7AM, please contact night-coverage [email or web address]   12/27/2019, 11:00 PM

## 2019-12-27 NOTE — ED Triage Notes (Signed)
Patient sent from cancer center today with complaints of weakness and bilateral leg numbness for several months. Patient reports he was supposed to get MRI at cancer center but could not find available time there, so provider recommended being sent here to get MRI sooner.

## 2019-12-27 NOTE — ED Notes (Signed)
ED TO INPATIENT HANDOFF REPORT  ED Nurse Name and Phone #: Clarise Cruz [phone]  S Name/Age/Gender Alec Harris 74 y.o. male Room/Bed: WA25/WA25  Code Status   Code Status: DNR  Home/SNF/Other Home Patient oriented to: self, place, time and situation Is this baseline? Yes   Triage Complete: Triage complete  Chief Complaint Lower extremity weakness [R29.898]  Triage Note Patient sent from cancer center today with complaints of weakness and bilateral leg numbness for several months. Patient reports he was supposed to get MRI at cancer center but could not find available time there, so provider recommended being sent here to get MRI sooner.     Allergies No Known Allergies  Level of Care/Admitting Diagnosis ED Disposition    ED Disposition Condition [facility] Area: [place] [100100]  Level of Care: Med-Surg [16]  May admit patient to [facility] or [facility] if equivalent level of care is available:: No  Covid Evaluation: Asymptomatic Screening Protocol (No Symptoms)  Diagnosis: Lower extremity weakness [id number]  Admitting Physician: Orene Desanctis [id number]  Attending Physician: Orene Desanctis [id number]  Estimated length of stay: past midnight tomorrow  Certification:: I certify this patient will need inpatient services for at least 2 midnights       B Medical/Surgery History Past Medical History:  Diagnosis Date  . Carotid artery disease ([place])    By doppler  . Claudication ([place]) 02/06/2012   ABI of 0.5 on both legs ,Rgt ext. iliac70 to 99%,rgt SFA occlude,lft ext.iliac common fem 70 to 99% ,Lft SFA occluded,mono flow popliteal  . Hyperlipemia   . Hypertension   . PAD (peripheral artery disease) ([place]) 01/2012   severe diffuse his iliacs,SFA, carotids  . Prostate cancer ([place])   . Rectal bleeding 07/24/11   Diverticular Bleeding  . Tobacco abuse    Past Surgical History:  Procedure Laterality Date  . CAROTID ANGIOGRAM  03/02/2012    bilateral carotid artery stenosis,95% right and 80% left with a type 2 arch  . CAROTID ANGIOGRAM N/A 03/02/2012   Procedure: CAROTID ANGIOGRAM;  Surgeon: Lorretta Harp, MD;  Location: [facility] CATH LAB;  Service: Cardiovascular;  Laterality: N/A;  . CAROTID ENDARTERECTOMY Right 04-08-12   CEA  . CAROTID STENT INSERTION Left 07/05/2013   Procedure: CAROTID STENT INSERTION;  Surgeon: Serafina Mitchell, MD;  Location: [facility] CATH LAB;  Service: Cardiovascular;  Laterality: Left;  . CERVICAL SPINE SURGERY  approx. 10 years ago   disc removed from neck  . COLONOSCOPY  07/25/2011   Procedure: COLONOSCOPY;  Surgeon: Beryle Beams;  Location: [facility] ENDOSCOPY;  Service: Endoscopy;  Laterality: N/A;  . DOPPLER ECHOCARDIOGRAPHY  03/24/2012   EF >70% LV normal  . ENDARTERECTOMY  04/08/2012   Procedure: ENDARTERECTOMY CAROTID;  Surgeon: Serafina Mitchell, MD;  Location: [facility] OR;  Service: Vascular;  Laterality: Right;  Right carotid endarterectomy with vascuguard 1cm x 6cm bovine patch angioplasty.   . IR FLUORO GUIDE PORT INSERTION RIGHT  06/05/2017  . IR US GUIDE VASC ACCESS RIGHT  06/05/2017  . KNEE CARTILAGE SURGERY     surgery for a torn ligament  . Lexiscan Myoview  01/14/2012   small fixed basal to mid inferior bowel attenuation artifact. no reversible ischemia  . LOWER EXTREMITY ANGIOGRAM N/A 03/02/2012   Procedure: LOWER EXTREMITY ANGIOGRAM;  Surgeon: Lorretta Harp, MD;  Location: [facility] CATH LAB;  Service: Cardiovascular;  Laterality: N/A;  . PENILE PROSTHESIS IMPLANT    . PROSTATE SURGERY  2003   for  prostate cancer  . PV angiogram  03/02/2012   total SFAs bilaterally with 3- vessel runoff below the knee, high-grade calcified iliac diseaese     A IV Location/Drains/Wounds Patient Lines/Drains/Airways Status   Active Line/Drains/Airways    Name:   Placement date:   Placement time:   Site:   Days:   Implanted Port 06/05/17 Right Chest   06/05/17    1159    Chest   935   Peripheral IV 12/27/19 Right  Forearm   12/27/19    1755    Forearm   less than 1   Incision 04/08/12 Neck Right   04/08/12    1039     2819          Intake/Output Last 24 hours No intake or output data in the 24 hours ending 12/27/19 2344  Labs/Imaging Results for orders placed or performed during the hospital encounter of 12/27/19 (from the past 48 hour(s))  Comprehensive metabolic panel     Status: Abnormal   Collection Time: 12/27/19  1:26 PM  Result Value Ref Range   Sodium 134 (L) 135 - 145 mmol/L   Potassium 3.9 3.5 - 5.1 mmol/L   Chloride 102 98 - 111 mmol/L   CO2 21 (L) 22 - 32 mmol/L   Glucose, Bld 111 (H) 70 - 99 mg/dL    Comment: Glucose reference range applies only to samples taken after fasting for at least 8 hours.   BUN 13 8 - 23 mg/dL   Creatinine, Ser 0.64 0.61 - 1.24 mg/dL   Calcium 8.0 (L) 8.9 - 10.3 mg/dL   Total Protein 5.5 (L) 6.5 - 8.1 g/dL   Albumin 2.7 (L) 3.5 - 5.0 g/dL   AST 17 15 - 41 U/L   ALT 14 0 - 44 U/L   Alkaline Phosphatase 605 (H) 38 - 126 U/L   Total Bilirubin 1.7 (H) 0.3 - 1.2 mg/dL   GFR calc non Af Amer >60 >60 mL/min   GFR calc Af Amer >60 >60 mL/min   Anion gap 11 5 - 15    Comment: Performed at [facility], [place] [address] [place], [place] [address]  CBC with Differential     Status: Abnormal   Collection Time: 12/27/19  1:26 PM  Result Value Ref Range   WBC 3.7 (L) 4.0 - 10.5 K/uL   RBC 2.38 (L) 4.22 - 5.81 MIL/uL   Hemoglobin 8.0 (L) 13.0 - 17.0 g/dL   HCT 24.7 (L) 39.0 - 52.0 %   MCV 103.8 (H) 80.0 - 100.0 fL   MCH 33.6 26.0 - 34.0 pg   MCHC 32.4 30.0 - 36.0 g/dL   RDW 20.0 (H) 11.5 - 15.5 %   Platelets 28 (LL) 150 - 400 K/uL    Comment: Immature Platelet Fraction may be clinically indicated, consider ordering this additional test [id number] THIS CRITICAL RESULT HAS VERIFIED AND BEEN CALLED TO [facility]. RN BY KATHLEEN COHEN ON 05 11 2021 AT 1539, AND HAS BEEN READ BACK. CRITICAL RESULT VERIFIED    nRBC 1.9 (H) 0.0 - 0.2 %    Neutrophils Relative % 76 %   Neutro Abs 2.8 1.7 - 7.7 K/uL   Lymphocytes Relative 10 %   Lymphs Abs 0.4 (L) 0.7 - 4.0 K/uL   Monocytes Relative 12 %   Monocytes Absolute 0.4 0.1 - 1.0 K/uL   Eosinophils Relative 0 %   Eosinophils Absolute 0.0 0.0 - 0.5 K/uL  Basophils Relative 0 %   Basophils Absolute 0.0 0.0 - 0.1 K/uL   Immature Granulocytes 2 %   Abs Immature Granulocytes 0.06 0.00 - 0.07 K/uL    Comment: Performed at [facility], [place] [address], [place], [place] [address]  SARS Coronavirus 2 by RT PCR (hospital order, performed in [facility] hospital lab) Nasopharyngeal Nasopharyngeal Swab     Status: None   Collection Time: 12/27/19  7:36 PM   Specimen: Nasopharyngeal Swab  Result Value Ref Range   SARS Coronavirus 2 NEGATIVE NEGATIVE    Comment: (NOTE) SARS-CoV-2 target nucleic acids are NOT DETECTED. The SARS-CoV-2 RNA is generally detectable in upper and lower respiratory specimens during the acute phase of infection. The lowest concentration of SARS-CoV-2 viral copies this assay can detect is 250 copies / mL. A negative result does not preclude SARS-CoV-2 infection and should not be used as the sole basis for treatment or other patient management decisions.  A negative result may occur with improper specimen collection / handling, submission of specimen other than nasopharyngeal swab, presence of viral mutation(s) within the areas targeted by this assay, and inadequate number of viral copies (<250 copies / mL). A negative result must be combined with clinical observations, patient history, and epidemiological information. Fact Sheet for Patients:   StrictlyIdeas.no Fact Sheet for Healthcare Providers: BankingDealers.co.za This test is not yet approved or cleared  by the [place] FDA and has been authorized for detection and/or diagnosis of SARS-CoV-2 by FDA under an Emergency Use Authorization  (EUA).  This EUA will remain in effect (meaning this test can be used) for the duration of the COVID-19 declaration under Section 564(b)(1) of the Act, 21 U.S.C. section 360bbb-3(b)(1), unless the authorization is terminated or revoked sooner. Performed at [facility], [place] [address], [place], [place] [address]    MR TOTAL SPINE METS SCREENING  Result Date: 12/27/2019 CLINICAL DATA:  Myelopathy. Metastatic prostate cancer. Increased weakness and numbness of legs. EXAM: MRI TOTAL SPINE WITHOUT AND WITH CONTRAST TECHNIQUE: Multisequence MR imaging of the spine from the cervical spine to the sacrum was performed prior to and following IV contrast administration for evaluation of spinal metastatic disease. CONTRAST:  7.59mL GADAVIST GADOBUTROL 1 MMOL/ML IV SOLN COMPARISON:  None. FINDINGS: MRI CERVICAL SPINE FINDINGS Alignment: Normal Vertebrae: ACDF with anterior plate at 075-GRM. Metastatic disease C5, C6, and C7 vertebral bodies extending into the posterior elements. Negative for fracture Cord: Hyperintensity in the cord at C3-4 compatible with chronic myelomalacia. This may be related to prior spinal stenosis. Spinal canal is adequate in size at this time. Posterior Fossa, vertebral arteries, paraspinal tissues: Negative Disc levels: Disc degeneration and spurring  C5-6 and C6-7.  No cord compression. Image quality degraded by motion throughout all of the study. MRI THORACIC SPINE FINDINGS Alignment:  Normal Vertebrae: Widespread metastatic disease throughout the vertebral bodies and posterior elements of the entire thoracic spine. There is enhancing epidural tumor in the canal at T7 to T9 causing cord deformity and spinal stenosis. No frank cord compression. Limited anatomic detail due to motion. Cord: Normal cord signal. Epidural tumor surrounding the cord T7 through T9 Paraspinal and other soft tissues: No paraspinous mass. Bibasilar atelectasis. Disc levels: Limited evaluation of the  disc spaces. There is mild disc degeneration in the thoracic spine without disc protrusion identified. MRI LUMBAR SPINE FINDINGS Segmentation: L5 is sacralized. There are 4 non-rib-bearing lumbar segments. Alignment:  Normal Vertebrae: Widespread metastatic disease throughout the vertebral bodies and  posterior elements throughout the lumbar spine extending into the thoracic spine. There is a mild fracture of L1 which appears chronic. Conus medullaris: Extends to the L1-2 level and appears normal. Paraspinal and other soft tissues: Negative Disc levels: Mild disc degeneration at multiple levels. No disc protrusion or spinal stenosis. No epidural tumor in the lumbar canal. IMPRESSION: This study is degraded by significant motion Widespread metastatic disease throughout the cervical, thoracic, and lumbar spine. Epidural tumor in the canal surrounding the cord at T7 through T9 causing cord deformity and mild spinal stenosis. Chronic myelomalacia in the cervical spinal cord at C3-4 at the site of prior ACDF. Electronically Signed   By: Franchot Gallo M.D.   On: 12/27/2019 19:27    Pending Labs Unresulted Labs (From admission, onward)    Start     Ordered   12/28/19 [id number]  Basic metabolic panel  Tomorrow morning,   R     12/27/19 2259   12/28/19 0500  CBC  Tomorrow morning,   R     12/27/19 2259          Vitals/Pain Today's Vitals   12/27/19 1730 12/27/19 1800 12/27/19 2009 12/27/19 2320  BP: (!) 151/73 139/73 126/69 122/69  Pulse:  100 (!) 102 (!) 102  Resp:  18 18 18   Temp:      TempSrc:      SpO2:  96% 99% 97%  Weight:      Height:      PainSc:        Isolation Precautions No active isolations  Medications Medications  dexamethasone (DECADRON) injection 4 mg (has no administration in time range)  aspirin EC tablet 81 mg (has no administration in time range)  HYDROcodone-acetaminophen (NORCO/VICODIN) 5-325 MG per tablet 1 tablet (has no administration in time range)  megestrol  (MEGACE) 40 MG/ML suspension 400 mg (has no administration in time range)  amLODipine (NORVASC) tablet 5 mg (has no administration in time range)  atorvastatin (LIPITOR) tablet 20 mg (has no administration in time range)  mirtazapine (REMERON) tablet 7.5 mg (has no administration in time range)  cilostazol (PLETAL) tablet 100 mg (has no administration in time range)  simethicone (MYLICON) 40 [id number] suspension 40 mg (has no administration in time range)  gadobutrol (GADAVIST) 1 MMOL/ML injection 6 mL (7.5 mLs Intravenous Contrast Given 12/27/19 1907)    Mobility non-ambulatory Moderate fall risk   Focused Assessments    R Recommendations: See Admitting Provider Note  Report given to:   Additional Notes:

## 2019-12-27 NOTE — Consult Note (Addendum)
.   Chief Complaint   Chief Complaint  Patient presents with  . Weakness  . Numbness    HPI   Consult requested by: Dr Melina Copa, [facility] Reason for consult: Thoracic spine tumor  HPI: Ayomikun Kinkel is a 74 y.o. male with history of known metastatic prostate cancer to bone, PVD, HDL, carotid artery stenosis who presented to ED at recommendation of rad-onc for worsening BLE for an MRI total spine. MRI T spine showed an epidural tumor encasing the spinal cord from T7-T9. A NSY consultation was requested. Patient's wife is present with him today and assists with history. Over the past 4 weeks or so, patient has noticed graudal, progressive weakness in BLE, particular with his hips, requiring him to ambulate with a rolling walker. Last night, the weakness became so severe that he has been unable to ambulate since. He is able to stand, but with a significant amount of assistance from his wife. He has not fallen. No bowel/bladder dysfunction. Denies pain. Takes 81mg  ASA daily.   Patient Active Problem List   Diagnosis Date Noted  . Malignant neoplasm of prostate metastatic to bone ([place]) 06/10/2019  . Encounter for antineoplastic chemotherapy 09/16/2017  . Port-A-Cath in place 07/15/2017  . Goals of care, counseling/discussion 05/21/2017  . Aftercare following surgery of the circulatory system, [place] 08/15/2013  . PVD (peripheral vascular disease) with claudication ([place]) 12/29/2012  . Carotid disease, bilateral ([place]) 12/29/2012  . Hyperlipemia 12/29/2012  . Prostate cancer ([place]) 07/07/2012  . Occlusion and stenosis of carotid artery without mention of cerebral infarction 03/29/2012    PMH: Past Medical History:  Diagnosis Date  . Carotid artery disease ([place])    By doppler  . Claudication ([place]) 02/06/2012   ABI of 0.5 on both legs ,Rgt ext. iliac70 to 99%,rgt SFA occlude,lft ext.iliac common fem 70 to 99% ,Lft SFA occluded,mono flow popliteal  . Hyperlipemia   . Hypertension   . PAD  (peripheral artery disease) ([place]) 01/2012   severe diffuse his iliacs,SFA, carotids  . Prostate cancer ([place])   . Rectal bleeding 07/24/11   Diverticular Bleeding  . Tobacco abuse     PSH: Past Surgical History:  Procedure Laterality Date  . CAROTID ANGIOGRAM  03/02/2012   bilateral carotid artery stenosis,95% right and 80% left with a type 2 arch  . CAROTID ANGIOGRAM N/A 03/02/2012   Procedure: CAROTID ANGIOGRAM;  Surgeon: Lorretta Harp, MD;  Location[facility] C[facility] LAB;  Service: Cardiovascular;  Laterality: N/A;  . CAROTID ENDARTERECTOMY Right 04-08-12   CEA  . CAROTID STENT INSERTION Left 07/05/2013   Procedure: CAROTID STENT INSERTION;  Surgeon: Serafina Mitchell, MD;  Location: [facility] CATH LAB;  Service: Cardiovascular;  Laterality: Left;  . CERVICAL SPINE SURGERY  approx. 10 years ago   disc removed from neck  . COLONOSCOPY  07/25/2011   Procedure: COLONOSCOPY;  Surgeon: Beryle Beams;  Location: [facility] ENDOSCOPY;  Service: Endoscopy;  Laterality: N/A;  . DOPPLER ECHOCARDIOGRAPHY  03/24/2012   EF >70% LV normal  . ENDARTERECTOMY  04/08/2012   Procedure: ENDARTERECTOMY CAROTID;  Surgeon: Serafina Mitchell, MD;  Location: [place] OR;  Service: Vascular;  Laterality: Right;  Right carotid endarterectomy with vascuguard 1cm x 6cm bovine patch angioplasty.   . IR FLUORO GUIDE PORT INSERTION RIGHT  06/05/2017  . IR US GUIDE VASC ACCESS RIGHT  06/05/2017  . KNEE CARTILAGE SURGERY     surgery for a torn ligament  . Lexiscan Myoview  01/14/2012   small fixed  basal to mid inferior bowel attenuation artifact. no reversible ischemia  . LOWER EXTREMITY ANGIOGRAM N/A 03/02/2012   Procedure: LOWER EXTREMITY ANGIOGRAM;  Surgeon: Lorretta Harp, MD;  Location: [facility] CATH LAB;  Service: Cardiovascular;  Laterality: N/A;  . PENILE PROSTHESIS IMPLANT    . PROSTATE SURGERY  2003   for  prostate cancer  . PV angiogram  03/02/2012   total SFAs bilaterally with 3- vessel runoff below the knee, high-grade calcified iliac  diseaese    (Not in a hospital admission)   SH: Social History   Tobacco Use  . Smoking status: Current Every Day Smoker    Years: 50.00    Types: Cigarettes  . Smokeless tobacco: Never Used  . Tobacco comment: pt states that he is going to quit completely; 1/2 pack per day  Substance Use Topics  . Alcohol use: Not Currently  . Drug use: No    MEDS: Prior to Admission medications   Medication Sig Start Date End Date Taking? Authorizing Provider  amLODipine (NORVASC) 5 MG tablet TAKE 1 TABLET (5 MG TOTAL) BY MOUTH DAILY. 04/28/19  Yes Lorretta Harp, MD  aspirin EC 81 MG tablet Take 81 mg by mouth daily.   Yes [provider]  atorvastatin (LIPITOR) 20 MG tablet TAKE 1 TABLET BY MOUTH EVERY DAY Patient taking differently: Take 20 mg by mouth daily.  05/18/19  Yes Lorretta Harp, MD  cilostazol (PLETAL) 100 MG tablet TAKE 1 TABLET BY MOUTH TWICE A DAY Patient taking differently: Take 100 mg by mouth 2 (two) times daily.  05/09/19  Yes Lorretta Harp, MD  HYDROcodone-acetaminophen (NORCO/VICODIN) 5-325 MG tablet Take 1 tablet by mouth every 8 (eight) hours as needed for moderate pain. 10/31/19  Yes [name], MD  lidocaine-prilocaine (EMLA) cream Apply 1 application topically as needed. 05/21/17  Yes [name], MD  megestrol (MEGACE) 40 MG/ML suspension TAKE 10 MLS (400 MG TOTAL) BY MOUTH DAILY. Patient taking differently: Take 400 mg by mouth daily.  11/21/19  Yes [name], MD  mirtazapine (REMERON) 7.5 MG tablet Take 1 tablet (7.5 mg total) by mouth at bedtime. 12/14/19  Yes Tanner, Lyndon Code., PA-C  oxyCODONE (OXY IR/ROXICODONE) 5 MG immediate release tablet Take 1 tablet (5 mg total) by mouth every 4 (four) hours as needed for severe pain. Patient not taking: Reported on 12/27/2019 11/01/19   [name], MD  traMADol (ULTRAM) 50 MG tablet Take 1 tablet (50 mg total) by mouth every 6 (six) hours as needed. Patient not taking: Reported on 12/27/2019  06/01/19   [name], MD    ALLERGY: No Known Allergies  Social History   Tobacco Use  . Smoking status: Current Every Day Smoker    Years: 50.00    Types: Cigarettes  . Smokeless tobacco: Never Used  . Tobacco comment: pt states that he is going to quit completely; 1/2 pack per day  Substance Use Topics  . Alcohol use: Not Currently     Family History  Problem Relation Age of Onset  . Hypertension Mother   . Heart attack Mother   . Heart disease Mother        CHF/  After age 75  . Heart attack Father   . Heart disease Father        Irregular Heart beat, Heart Disease before age 55  . Cancer Brother        throat  . Breast cancer Neg Hx   .  Colon cancer Neg Hx   . Prostate cancer Neg Hx      ROS   Review of Systems  Constitutional: Negative.   HENT: Negative.   Eyes: Negative.  Negative for blurred vision, double vision and photophobia.  Respiratory: Negative.   Cardiovascular: Negative.   Gastrointestinal: Negative for nausea and vomiting.  Genitourinary: Negative.   Musculoskeletal: Negative.  Negative for back pain, falls, joint pain, myalgias and neck pain.  Skin: Negative.   Neurological: Positive for tingling (BLE) and weakness (BLE). Negative for dizziness, tremors, sensory change, speech change and headaches.    Exam   Vitals:   12/27/19 1800 12/27/19 2009  BP: 139/73 126/69  Pulse: 100 (!) 102  Resp: 18 18  Temp:    SpO2: 96% 99%   General appearance: WDWN, NAD Eyes: No scleral injection Cardiovascular: Regular rate and rhythm without murmurs, rubs, gallops. No edema or variciosities. Distal pulses normal. Pulmonary: Effort normal, non-labored breathing Musculoskeletal:     Muscle tone upper extremities: Normal    Muscle tone lower extremities: Normal    Motor exam: Upper Extremities Deltoid Bicep Tricep Grip  Right 5/5 5/5 5/5 5/5  Left 5/5 5/5 5/5 5/5   Lower Extremity IP Quad PF DF EHL  Right 2/5 4-/5 4/5 4+/5 5/5  Left 2/5  4-/5 4/5 4+/5 5/5   Neurological Mental Status:    - Patient is awake, alert, oriented to person, place, month, year, and situation    - Patient is able to give a clear and coherent history.    - No signs of aphasia or neglect Cranial Nerves    - II: Visual Fields are full. PERRL    - III/IV/VI: EOMI without ptosis or diploplia.     - V: Facial sensation is grossly normal    - VII: Facial movement is symmetric.     - VIII: hearing is intact to voice    - X: Uvula elevates symmetrically    - XI: Shoulder shrug is symmetric.    - XII: tongue is midline without atrophy or fasciculations.  Sensory: ?decreased sensation BLE 2+ patellar reflexes. No clonus.  Results - Imaging/Labs   Results for orders placed or performed during the hospital encounter of 12/27/19 (from the past 48 hour(s))  Comprehensive metabolic panel     Status: Abnormal   Collection Time: 12/27/19  1:26 PM  Result Value Ref Range   Sodium 134 (L) 135 - 145 mmol/L   Potassium 3.9 3.5 - 5.1 mmol/L   Chloride 102 98 - 111 mmol/L   CO2 21 (L) 22 - 32 mmol/L   Glucose, Bld 111 (H) 70 - 99 mg/dL    Comment: Glucose reference range applies only to samples taken after fasting for at least 8 hours.   BUN 13 8 - 23 mg/dL   Creatinine, Ser 0.64 0.61 - 1.24 mg/dL   Calcium 8.0 (L) 8.9 - 10.3 mg/dL   Total Protein 5.5 (L) 6.5 - 8.1 g/dL   Albumin 2.7 (L) 3.5 - 5.0 g/dL   AST 17 15 - 41 U/L   ALT 14 0 - 44 U/L   Alkaline Phosphatase 605 (H) 38 - 126 U/L   Total Bilirubin 1.7 (H) 0.3 - 1.2 mg/dL   GFR calc non Af Amer >60 >60 mL/min   GFR calc Af Amer >60 >60 mL/min   Anion gap 11 5 - 15    Comment: Performed at [facility], [place] [address], [place], [place] [address]  CBC with Differential     Status: Abnormal   Collection Time: 12/27/19  1:26 PM  Result Value Ref Range   WBC 3.7 (L) 4.0 - 10.5 K/uL   RBC 2.38 (L) 4.22 - 5.81 MIL/uL   Hemoglobin 8.0 (L) 13.0 - 17.0 g/dL   HCT 24.7 (L) 39.0 - 52.0 %    MCV 103.8 (H) 80.0 - 100.0 fL   MCH 33.6 26.0 - 34.0 pg   MCHC 32.4 30.0 - 36.0 g/dL   RDW 20.0 (H) 11.5 - 15.5 %   Platelets 28 (LL) 150 - 400 K/uL    Comment: Immature Platelet Fraction may be clinically indicated, consider ordering this additional test [id number] THIS CRITICAL RESULT HAS VERIFIED AND BEEN CALLED TO [facility]. RN BY KATHLEEN COHEN ON 05 11 2021 AT [id number], AND HAS BEEN READ BACK. CRITICAL RESULT VERIFIED    nRBC 1.9 (H) 0.0 - 0.2 %   Neutrophils Relative % 76 %   Neutro Abs 2.8 1.7 - 7.7 K/uL   Lymphocytes Relative 10 %   Lymphs Abs 0.4 (L) 0.7 - 4.0 K/uL   Monocytes Relative 12 %   Monocytes Absolute 0.4 0.1 - 1.0 K/uL   Eosinophils Relative 0 %   Eosinophils Absolute 0.0 0.0 - 0.5 K/uL   Basophils Relative 0 %   Basophils Absolute 0.0 0.0 - 0.1 K/uL   Immature Granulocytes 2 %   Abs Immature Granulocytes 0.06 0.00 - 0.07 K/uL    Comment: Performed at [facility], [place] [address] [place], [place] [address]    MR TOTAL SPINE METS SCREENING  Result Date: 12/27/2019 CLINICAL DATA:  Myelopathy. Metastatic prostate cancer. Increased weakness and numbness of legs. EXAM: MRI TOTAL SPINE WITHOUT AND WITH CONTRAST TECHNIQUE: Multisequence MR imaging of the spine from the cervical spine to the sacrum was performed prior to and following IV contrast administration for evaluation of spinal metastatic disease. CONTRAST:  7.69mL GADAVIST GADOBUTROL 1 MMOL/ML IV SOLN COMPARISON:  None. FINDINGS: MRI CERVICAL SPINE FINDINGS Alignment: Normal Vertebrae: ACDF with anterior plate at 075-GRM. Metastatic disease C5, C6, and C7 vertebral bodies extending into the posterior elements. Negative for fracture Cord: Hyperintensity in the cord at C3-4 compatible with chronic myelomalacia. This may be related to prior spinal stenosis. Spinal canal is adequate in size at this time. Posterior Fossa, vertebral arteries, paraspinal tissues: Negative Disc levels: Disc degeneration and  spurring  C5-6 and C6-7.  No cord compression. Image quality degraded by motion throughout all of the study. MRI THORACIC SPINE FINDINGS Alignment:  Normal Vertebrae: Widespread metastatic disease throughout the vertebral bodies and posterior elements of the entire thoracic spine. There is enhancing epidural tumor in the canal at T7 to T9 causing cord deformity and spinal stenosis. No frank cord compression. Limited anatomic detail due to motion. Cord: Normal cord signal. Epidural tumor surrounding the cord T7 through T9 Paraspinal and other soft tissues: No paraspinous mass. Bibasilar atelectasis. Disc levels: Limited evaluation of the disc spaces. There is mild disc degeneration in the thoracic spine without disc protrusion identified. MRI LUMBAR SPINE FINDINGS Segmentation: L5 is sacralized. There are 4 non-rib-bearing lumbar segments. Alignment:  Normal Vertebrae: Widespread metastatic disease throughout the vertebral bodies and posterior elements throughout the lumbar spine extending into the thoracic spine. There is a mild fracture of L1 which appears chronic. Conus medullaris: Extends to the L1-2 level and appears normal. Paraspinal and other soft tissues: Negative Disc levels: Mild disc degeneration at multiple levels. No disc protrusion or  spinal stenosis. No epidural tumor in the lumbar canal. IMPRESSION: This study is degraded by significant motion Widespread metastatic disease throughout the cervical, thoracic, and lumbar spine. Epidural tumor in the canal surrounding the cord at T7 through T9 causing cord deformity and mild spinal stenosis. Chronic myelomalacia in the cervical spinal cord at C3-4 at the site of prior ACDF. Electronically Signed   By: Franchot Gallo M.D.   On: 12/27/2019 19:27   Impression/Plan   74 y.o. male with BLE weakness likely secondary to a thoracic spinal tumor encasing the cord from T7-T9. Deficits noted as above. I have reviewed the case with Dr Kathyrn Sheriff who has also  reviewed the imaging. While there isn't significant cord compression, his history is fairly consistent with myelopathy. We could conceivably take him to the OR tomorrow afternoon for multilevel thoracic laminectomy, however Dr Kathyrn Sheriff would like to talk to rad-onc first about potential radiation as sole treatment.   Patient to be admitted under [facility] and transferred to [facility]. Surgery would be performed tomorrow afternoon, pending discuss with rad-onc. Will make patient NPO at midnight. Patient and wife understand plan. Will follow up tomorrow am after talking with rad-onc. - Started decadron 4mg  q 6 hours.  Ferne Reus, PA-C [facility] [employer]

## 2019-12-27 NOTE — ED Notes (Signed)
Per cancer center-states patient unable to bear weight-states patient has not had a BM in 1 week-Dr Tammi Klippel sending patient to ED for possible cord compression

## 2019-12-27 NOTE — ED Provider Notes (Addendum)
Signout from Dr. Karlton Lemon.  74 year old male with known sternal mets here with increased weakness in his lower extremities.  He is pending an MRI of spine.  Likely will need admission. Physical Exam  BP 134/61   Pulse (!) 106   Temp 97.6 F (36.4 C) (Oral)   Resp 13   Ht 5\' 9"  (1.753 m)   Wt 72.6 kg   SpO2 95%   BMI 23.63 kg/m   Physical Exam  ED Course/Procedures   Clinical Course as of Dec 28 1215  Tue Dec 27, 2019  1545 CBC shows worsening pancytopenia, CMP is unchanged.    [CS]  [id number] Per Radiology, patient has penile implant that may not be MRI compatible, they are waiting on records regarding type of device and when it was implanted. Care will be signed out to Dr. Melina Copa at the change of shift.    [CS]  1949 Discussed with Dr. Flossie Buffy from [facility] hospitalist who will evaluate the patient for admission.   [MB]  2010 Discussed with PA Costello from neurosurgery who will evaluate the patient in the department.   [MB]    Clinical Course User Index [CS] Truddie Hidden, MD [MB] Hayden Rasmussen, MD    Procedures  MDM  Patient's MRI spine is significantly abnormal.  I went to examine the patient prior to a discuss his case with neurosurgery.  His upper extremity strength is intact.  He has normal sensation to light touch bilateral lower extremities and trunk.  His distal strength bilaterally is probably 4 out of 5 but his proximal leg strength cannot hold up against gravity.  He has had no bowel or bladder incontinence.  Patient evaluated by neurosurgery PA Costello.  Plan from Dr. Kathyrn Sheriff is admission to hospital and medical service at [facility] site for possible surgery tomorrow.  He is anticipating discussion with radiation oncology for possible radiation to the area versus surgery.  Plan discussed with [facility] hospitalist Dr. Flossie Buffy who will evaluate the patient for admission.  Patient updated on plan and is in agreement for admission.  Clear discussion with PA Costello that the  recommendation from Neurosurg is not to start steroids at time of his consult.     Hayden Rasmussen, MD 12/28/19 1218    Hayden Rasmussen, MD 12/28/19 [phone]

## 2019-12-27 NOTE — ED Notes (Signed)
[facility] called for transport. 

## 2019-12-27 NOTE — ED Provider Notes (Signed)
[facility] DEPT Provider Note   CSN: [id number] Arrival date & time: 12/27/19  1235     History Chief Complaint  Patient presents with  . Weakness  . Numbness    Alec Harris is a 74 y.o. male.  HPI Patient with history of prostate cancer with known mets to spine who was sent to the ED from [facility]. He and wife reports increased weakness and numbness of bilateral LE for the last several weeks but acute change since last night, now unable to stand or walk even with assistance/walker. Rad/Onc attempted to get emergent MRI done through their clinic but unfortunately were unable to arrange imaging study on short notice. Patient denies bowel or bladder incontinence.      Past Medical History:  Diagnosis Date  . Carotid artery disease ([place])    By doppler  . Claudication ([place]) 02/06/2012   ABI of 0.5 on both legs ,Rgt ext. iliac70 to 99%,rgt SFA occlude,lft ext.iliac common fem 70 to 99% ,Lft SFA occluded,mono flow popliteal  . Hyperlipemia   . Hypertension   . PAD (peripheral artery disease) ([place]) 01/2012   severe diffuse his iliacs,SFA, carotids  . Prostate cancer ([place])   . Rectal bleeding 07/24/11   Diverticular Bleeding  . Tobacco abuse     Patient Active Problem List   Diagnosis Date Noted  . Malignant neoplasm of prostate metastatic to bone ([place]) 06/10/2019  . Encounter for antineoplastic chemotherapy 09/16/2017  . Port-A-Cath in place 07/15/2017  . Goals of care, counseling/discussion 05/21/2017  . Aftercare following surgery of the circulatory system, [place] 08/15/2013  . PVD (peripheral vascular disease) with claudication ([place]) 12/29/2012  . Carotid disease, bilateral ([place]) 12/29/2012  . Hyperlipemia 12/29/2012  . Prostate cancer ([place]) 07/07/2012  . Occlusion and stenosis of carotid artery without mention of cerebral infarction 03/29/2012    Past Surgical History:  Procedure Laterality Date  . CAROTID ANGIOGRAM  03/02/2012   bilateral carotid artery stenosis,95% right and 80% left with a type 2 arch  . CAROTID ANGIOGRAM N/A 03/02/2012   Procedure: CAROTID ANGIOGRAM;  Surgeon: Lorretta Harp, MD;  Location: [facility] CATH LAB;  Service: Cardiovascular;  Laterality: N/A;  . CAROTID ENDARTERECTOMY Right 04-08-12   CEA  . CAROTID STENT INSERTION Left 07/05/2013   Procedure: CAROTID STENT INSERTION;  Surgeon: Serafina Mitchell, MD;  Location: [facility] CATH LAB;  Service: Cardiovascular;  Laterality: Left;  . CERVICAL SPINE SURGERY  approx. 10 years ago   disc removed from neck  . COLONOSCOPY  07/25/2011   Procedure: COLONOSCOPY;  Surgeon: Beryle Beams;  Location: [facility] ENDOSCOPY;  Service: Endoscopy;  Laterality: N/A;  . DOPPLER ECHOCARDIOGRAPHY  03/24/2012   EF >70% LV normal  . ENDARTERECTOMY  04/08/2012   Procedure: ENDARTERECTOMY CAROTID;  Surgeon: Serafina Mitchell, MD;  Location: [facility] OR;  Service: Vascular;  Laterality: Right;  Right carotid endarterectomy with vascuguard 1cm x 6cm bovine patch angioplasty.   . IR FLUORO GUIDE PORT INSERTION RIGHT  06/05/2017  . IR US GUIDE VASC ACCESS RIGHT  06/05/2017  . KNEE CARTILAGE SURGERY     surgery for a torn ligament  . Lexiscan Myoview  01/14/2012   small fixed basal to mid inferior bowel attenuation artifact. no reversible ischemia  . LOWER EXTREMITY ANGIOGRAM N/A 03/02/2012   Procedure: LOWER EXTREMITY ANGIOGRAM;  Surgeon: Lorretta Harp, MD;  Location: [facility] CATH LAB;  Service: Cardiovascular;  Laterality: N/A;  . PENILE PROSTHESIS IMPLANT    . PROSTATE  SURGERY  2003   for  prostate cancer  . PV angiogram  03/02/2012   total SFAs bilaterally with 3- vessel runoff below the knee, high-grade calcified iliac diseaese       Family History  Problem Relation Age of Onset  . Hypertension Mother   . Heart attack Mother   . Heart disease Mother        CHF/  After age 76  . Heart attack Father   . Heart disease Father        Irregular Heart beat, Heart Disease before age 7  .  Cancer Brother        throat  . Breast cancer Neg Hx   . Colon cancer Neg Hx   . Prostate cancer Neg Hx     Social History   Tobacco Use  . Smoking status: Current Every Day Smoker    Years: 50.00    Types: Cigarettes  . Smokeless tobacco: Never Used  . Tobacco comment: pt states that he is going to quit completely; 1/2 pack per day  Substance Use Topics  . Alcohol use: Not Currently  . Drug use: No    Home Medications Prior to Admission medications   Medication Sig Start Date End Date Taking? Authorizing Provider  amLODipine (NORVASC) 5 MG tablet TAKE 1 TABLET (5 MG TOTAL) BY MOUTH DAILY. 04/28/19  Yes Lorretta Harp, MD  aspirin EC 81 MG tablet Take 81 mg by mouth daily.   Yes [provider]  atorvastatin (LIPITOR) 20 MG tablet TAKE 1 TABLET BY MOUTH EVERY DAY Patient taking differently: Take 20 mg by mouth daily.  05/18/19  Yes Lorretta Harp, MD  cilostazol (PLETAL) 100 MG tablet TAKE 1 TABLET BY MOUTH TWICE A DAY Patient taking differently: Take 100 mg by mouth 2 (two) times daily.  05/09/19  Yes Lorretta Harp, MD  HYDROcodone-acetaminophen (NORCO/VICODIN) 5-325 MG tablet Take 1 tablet by mouth every 8 (eight) hours as needed for moderate pain. 10/31/19  Yes [name], MD  lidocaine-prilocaine (EMLA) cream Apply 1 application topically as needed. 05/21/17  Yes [name], MD  megestrol (MEGACE) 40 MG/ML suspension TAKE 10 MLS (400 MG TOTAL) BY MOUTH DAILY. Patient taking differently: Take 400 mg by mouth daily.  11/21/19  Yes [name], MD  mirtazapine (REMERON) 7.5 MG tablet Take 1 tablet (7.5 mg total) by mouth at bedtime. 12/14/19  Yes Tanner, Lyndon Code., PA-C  oxyCODONE (OXY IR/ROXICODONE) 5 MG immediate release tablet Take 1 tablet (5 mg total) by mouth every 4 (four) hours as needed for severe pain. Patient not taking: Reported on 12/27/2019 11/01/19   [name], MD  traMADol (ULTRAM) 50 MG tablet Take 1 tablet (50 mg total) by mouth every 6  (six) hours as needed. Patient not taking: Reported on 12/27/2019 06/01/19   [name], MD    Allergies    Patient has no known allergies.  Review of Systems   Review of Systems  Constitutional: Negative for fever.  HENT: Negative for congestion and sore throat.   Respiratory: Negative for cough and shortness of breath.   Cardiovascular: Negative for chest pain.  Gastrointestinal: Negative for abdominal pain, diarrhea, nausea and vomiting.  Genitourinary: Negative for dysuria.  Musculoskeletal: Negative for myalgias.  Skin: Negative for rash.  Neurological: Positive for weakness and numbness. Negative for headaches.  Psychiatric/Behavioral: Negative for behavioral problems.    Physical Exam Updated Vital Signs BP 132/71 (BP Location: Right Arm)  Pulse (!) 108   Temp 97.6 F (36.4 C) (Oral)   Resp 18   Ht 5\' 9"  (1.753 m)   Wt 72.6 kg   SpO2 97%   BMI 23.63 kg/m   Physical Exam Vitals and nursing note reviewed.  Constitutional:      Appearance: Normal appearance.  HENT:     Head: Normocephalic and atraumatic.     Nose: Nose normal.     Mouth/Throat:     Mouth: Mucous membranes are moist.  Eyes:     Extraocular Movements: Extraocular movements intact.     Conjunctiva/sclera: Conjunctivae normal.  Cardiovascular:     Rate and Rhythm: Normal rate.  Pulmonary:     Effort: Pulmonary effort is normal.     Breath sounds: Normal breath sounds.  Abdominal:     General: Abdomen is flat.     Palpations: Abdomen is soft.     Tenderness: There is no abdominal tenderness.  Musculoskeletal:        General: No swelling. Normal range of motion.     Cervical back: Neck supple.  Skin:    General: Skin is warm and dry.  Neurological:     General: No focal deficit present.     Mental Status: He is alert.     Cranial Nerves: Cranial nerve deficit present.     Sensory: Sensory deficit (diffuse BLE) present.     Motor: Weakness (BLE, proximal worse than distal) present.    Psychiatric:        Mood and Affect: Mood normal.     ED Results / Procedures / Treatments   Labs (all labs ordered are listed, but only abnormal results are displayed) Labs Reviewed  COMPREHENSIVE METABOLIC PANEL - Abnormal; Notable for the following components:      Result Value   Sodium 134 (*)    CO2 21 (*)    Glucose, Bld 111 (*)    Calcium 8.0 (*)    Total Protein 5.5 (*)    Albumin 2.7 (*)    Alkaline Phosphatase 605 (*)    Total Bilirubin 1.7 (*)    All other components within normal limits  CBC WITH DIFFERENTIAL/PLATELET - Abnormal; Notable for the following components:   WBC 3.7 (*)    RBC 2.38 (*)    Hemoglobin 8.0 (*)    HCT 24.7 (*)    MCV 103.8 (*)    RDW 20.0 (*)    Platelets 28 (*)    nRBC 1.9 (*)    Lymphs Abs 0.4 (*)    All other components within normal limits    EKG None  Radiology No results found.  Procedures Procedures (including critical care time)  Medications Ordered in ED Medications - No data to display  ED Course  I have reviewed the triage vital signs and the nursing notes.  Pertinent labs & imaging results that were available during my care of the patient were reviewed by me and considered in my medical decision making (see chart for details).  Clinical Course as of Dec 27 1646  Tue Dec 27, 2019  1545 CBC shows worsening pancytopenia, CMP is unchanged.    [CS]  [id number] Per Radiology, patient has penile implant that may not be MRI compatible, they are waiting on records regarding type of device and when it was implanted. Care will be signed out to Dr. Melina Copa at the change of shift.    [CS]    Clinical Course User Index [CS] Truddie Hidden, MD  MDM Rules/Calculators/A&P                      Spoke with Freeman Caldron, NP with Rad Onc who is concerned about cord compression. Patient has not had imaging in several months and is now unable to stand. He is also due for labs today, will check CBC, CMP and send for MRI.    Final Clinical Impression(s) / ED Diagnoses Final diagnoses:  None    Rx / DC Orders ED Discharge Orders    None       Truddie Hidden, MD 12/27/19 [phone]

## 2019-12-27 NOTE — Telephone Encounter (Signed)
Called patient to remind of follow up phone encounter on 12/28/19 patient reports in conversation he has increased weakness and his legs feel numb wife states its hard for patient to get around and transfer due to weakness and numbness to his legs. Provider made aware

## 2019-12-27 NOTE — Progress Notes (Signed)
Radiation Oncology         [phone] ________________________________  Name: Alec Harris MRN: [id number]  Date: 12/27/2019  DOB: [birth year]  Post Treatment Note  CC: Patient, No Pcp Per  [name], MD  Diagnosis:   74 yo man with metastatic, castrate-resistant prostate cancer with new onset LE paraesthesias and weakness, concerning for progression of known diffuse bony disease in the thoracolumbar spine, possibly cord compression.  Interval Since Last Radiation:  4 weeks  11/10/19 - 11/24/19: The painful lesion at L3 was treated to 30 Gy in 10 fractions.  07/25/19-08/08/19:   A field arrangement including the right ribs and sternum was treated to 30 Gy in 10 fractions of 3 Gy  06/16/2019 - 06/29/2019:   Right Femur / 30 Gy in 10 fractions  Narrative:  The patient presents today, a few days prior to his scheduled 1 month follow up, with complaints of progressive bilateral LE weakness and numbness.  He tolerated his recent palliative radiation treatment relatively well with no acute ill side effects noted.  Apparently, the weakness and numbness have been present and progressively worsening over the past 3-4 weeks but as of last night, he is no longer able to stand to transfer. Prior to last night, he was walking to and from the bathroom with assistance. Wife states that when he got up to go to the restroom last night, he could not bear any weight because he could not feel his legs. He denies bladder issues but reports that he has not had a bowel movement in over a week.  He is not taking pain medication per report and denies any back pain or focal pain elsewhere. He has not had any recent falls at home.  In summary, he was initially diagnosed with prostate cancer with Dr. Lowella Bandy in 2003 and opted to undergo a radical retropubic prostatectomy with BPLND on 12/20/2001. Surgical pathology showed Gleason 4+4 disease with extracapsular extension to the right and left apex as well as the  posterior lateral margins, bladder neck involvement, bilateral seminal vesicle involvement, and a positive lymph node (1/3). He was subsequently started on Lupron.  He unfortunately developed castration resistant disease with a rising PSA and was initially treated with prednisone and ketoconazole until 2013 when he developed progression with asymptomatic bone metastasis. He referred to Dr. [name] Blew and treated with Zytiga from 05/2012 to 05/2017. This was discontinued due to disease progression and he was switched to Taxotere from 05/2017 to 11/2017 (completed 10 cycles) with PSA decreased to 2.2 in 11/2017.  He went on observation only for a short time but his PSA increased 6.3 in 03/2018 so he was started on Xtandi from 03/2018 to 07/2018.  Unfortunately his PSA continued to rise despite Xtandi so this was discontinued and he was switched to [place] in 07/2018 to 01/2019. He had a good response to the Jevtana initially and completed 6 cycles on 01/2018.  He has been on a treatment break since that time and unfortunately, a follow-up PSA on 04/14/2019 was back up to 39.5.  Restaging CT A/P on 04/27/2019 showed widespread patchy sclerotic osseous metastases throughout the visualized skeleton but no lymphadenopathy or other findings of visceral metastatic disease.  A repeat bone scan on 05/05/2019 again confirmed diffuse severe multifocal areas of increased uptake consistent with metastatic disease with increased activity within a right upper posterior rib but improved previously-identified skull lesions.  His PSA has also continued to climb, from 20 on 03/08/2019 to 39.5 on  04/14/19 and up to 111 on 05/31/2019. When seen in 05/2019, he was complaining of severe pain in the right femur, progressive over the prior 6 weeks without known injury.  He had not had any falls around the house and had been using a cane and/or rolling walker to assist him with safe mobility. He denied associated paresthesias or focal weakness in the  lower extremities but reported that the pain would occasionally wake him from sleep at night.  He denied any back pain or bowel or bladder dysfunction and denied any other sites of focal, specific bone pain.  He elected to proceed with palliative radiotherapy to the right femur which was completed 06/29/19 and he reported significant improvement in his pain. He also agreed to proceed with Xofigo infusions for management of the diffuse bony disease but this was delayed in getting started due to a delay in insurance approval but was ultimately approved and started on 08/02/19. He is scheduled for his 6th and final Xofigo infusion on 01/06/20.  He presented to the ED on 07/09/19 with complaints of increasing bone pain in the sternum and posterior right ribs, associated with known sites of metastatic disease.  He had been taking Vicodin for his pain which was becoming less effective. He was referred back to our clinic to discuss palliative radiotherapy to the new painful sites of disease and elected to proceed with palliative radiotherapy to the right ribs and sternum, completed 08/08/19.   In March 2021, he reported new onset low back pain x 2 weeks with intermittent sharp pain that takes his breath away with certain movements. He denied changes in bowel or bladder, numbness of lower extremities or weakness at that time. Patient reports when he is laying still in his bed he is pain free. The pain was unrelieved with Tramadol or Vicodin, therefore, he elected to proceed with palliative radiotherapy to a known metastatic lesion at L3, and this was recently completed on 11/25/2019 and tolerated well.                          On review of systems, the patient states that he is doing fair overall.  He tolerated the recent course of radiotherapy very well and has had significant improvement in his pain since completing treatment.  His only complaint at present is of progressive bilateral LE weakness and numbness.    Apparently, the weakness and numbness have been present and progressively worsening over the past 3-4 weeks but as of last night, he is no longer able to stand to transfer. Prior to last night, he was walking to and from the bathroom with assistance. Wife states that when he got up to go to the restroom last night, he could not bear any weight because he could not feel his legs. He denies bladder issues but reports that he has not had a bowel movement in over a week.  He is not taking pain medication per report and denies any back pain or focal pain elsewhere. He has not had any recent falls at home. He continues with decreased appetite and is eating and drinking very little at this point.  He has continued with Eligard q 4 months under the care of Dr. [name] Blew and is also receiving Xofigo infusions monthly, and tolerating these well with only mild diarrhea and gas for 24 hours after infusions. He had infusion 5 of 6 on 12/09/19. His most recent PSA on 10/18/19 was 292, up  from 245 on 08/29/19 and 221 on 07/19/19 but is rising at a much slower rate than prior to XRT and Xofigo.  The current plan is to continue with Eligard every 4 months (next injection due this month) and complete the planned 6 or 6 Xofigo infusions.   ALLERGIES:  has No Known Allergies.  Meds: Current Outpatient Medications  Medication Sig Dispense Refill  . amLODipine (NORVASC) 5 MG tablet TAKE 1 TABLET (5 MG TOTAL) BY MOUTH DAILY. 90 tablet 2  . aspirin EC 81 MG tablet Take 81 mg by mouth daily.    [name] atorvastatin (LIPITOR) 20 MG tablet TAKE 1 TABLET BY MOUTH EVERY DAY 90 tablet 2  . Calcium Carb-Cholecalciferol (CALCIUM 500 +D) 500-400 MG-UNIT TABS Take 1 tablet by mouth daily.     . cilostazol (PLETAL) 100 MG tablet TAKE 1 TABLET BY MOUTH TWICE A DAY 180 tablet 2  . HYDROcodone-acetaminophen (NORCO/VICODIN) 5-325 MG tablet Take 1 tablet by mouth every 8 (eight) hours as needed for moderate pain. 30 tablet 0  . lidocaine-prilocaine (EMLA)  cream Apply 1 application topically as needed. 30 g 0  . megestrol (MEGACE) 40 MG/ML suspension TAKE 10 MLS (400 MG TOTAL) BY MOUTH DAILY. 240 mL 0  . mirtazapine (REMERON) 7.5 MG tablet Take 1 tablet (7.5 mg total) by mouth at bedtime. 30 tablet 5  . oxyCODONE (OXY IR/ROXICODONE) 5 MG immediate release tablet Take 1 tablet (5 mg total) by mouth every 4 (four) hours as needed for severe pain. 30 tablet 0  . traMADol (ULTRAM) 50 MG tablet Take 1 tablet (50 mg total) by mouth every 6 (six) hours as needed. 30 tablet 0   No current facility-administered medications for this encounter.    Physical Findings:  height is 5\' 9"  (1.753 m). His temporal temperature is 98.7 F (37.1 C). His blood pressure is 105/61 and his pulse is 115 (abnormal). His respiration is 18 and oxygen saturation is 100%.   /In general this is a well appearing African American male in no acute distress. He's alert and oriented x4 and appropriate throughout the examination. Cardiopulmonary assessment is negative for acute distress and he exhibits normal effort. He has dulled sensation to light touch in the LEs from the hip to the feet. He has very limited active ROM in the LEs bilaterally, unable to flex the hips or extend the knees.  He is able to dorsiflex and plantar flex the feet bilaterally but with 2/5 strength. Otherwise, he appears neurologically intact.   Lab Findings: Lab Results  Component Value Date   WBC 4.7 12/14/2019   HGB 9.0 (L) 12/14/2019   HCT 27.3 (L) 12/14/2019   MCV 99.6 12/14/2019   PLT 51 (L) 12/14/2019     Radiographic Findings: NM XOFIGO INJECTION  Result Date: 12/09/2019  Trudi Ida was injected intravenously in Nuclear Medicine under the supervision of the attending radiologist    Impression/Plan: 72. 74 yo man with metastatic, castrate-resistant prostate cancer with new onset LE paraesthesias and weakness, concerning for progression of known diffuse bony disease in the thoracolumbar spine,  possibly cord compression. Given his symptomology concerning for possible cord compression, the patient is sent to the ED at [facility] for urgent evaluation and management.  I called ahead and informed the charge nurse of our concerns and our nursing staff escorted the patient by wheelchair as directed, to room 25 in the ED. Hopefully, he will be able to have STAT spine imaging and we will await results to  determine any further role for radiotherapy at this time.    Nicholos Johns, PA-C

## 2019-12-27 NOTE — ED Notes (Signed)
Dr. Karle Starch aware patients platelets are 28.

## 2019-12-28 ENCOUNTER — Encounter (HOSPITAL_COMMUNITY): Payer: Self-pay | Admitting: Family Medicine

## 2019-12-28 ENCOUNTER — Ambulatory Visit: Admission: RE | Admit: 2019-12-28 | Payer: Medicare Other | Source: Ambulatory Visit | Admitting: Radiation Oncology

## 2019-12-28 ENCOUNTER — Encounter (HOSPITAL_COMMUNITY): Payer: Self-pay | Admitting: Certified Registered Nurse Anesthetist

## 2019-12-28 ENCOUNTER — Ambulatory Visit
Admission: RE | Admit: 2019-12-28 | Discharge: 2019-12-28 | Disposition: A | Discharge status: Home / Self Care | Payer: Medicare Other | Source: Ambulatory Visit | Attending: Radiation Oncology | Admitting: Radiation Oncology

## 2019-12-28 ENCOUNTER — Ambulatory Visit
Admission: RE | Admit: 2019-12-28 | Discharge: 2019-12-28 | Disposition: A | Discharge status: Home / Self Care | Payer: Medicare Other | Source: Ambulatory Visit | Attending: Urology | Admitting: Urology

## 2019-12-28 ENCOUNTER — Encounter (HOSPITAL_COMMUNITY)
Admission: EM | Disposition: A | Discharge status: Hospice | Payer: Self-pay | Source: Home / Self Care | Attending: Internal Medicine

## 2019-12-28 DIAGNOSIS — C61 Malignant neoplasm of prostate: Secondary | ICD-10-CM

## 2019-12-28 DIAGNOSIS — L8990 Pressure ulcer of unspecified site, unspecified stage: Secondary | ICD-10-CM | POA: Diagnosis present

## 2019-12-28 DIAGNOSIS — Z923 Personal history of irradiation: Secondary | ICD-10-CM

## 2019-12-28 DIAGNOSIS — C7951 Secondary malignant neoplasm of bone: Principal | ICD-10-CM

## 2019-12-28 DIAGNOSIS — Z9221 Personal history of antineoplastic chemotherapy: Secondary | ICD-10-CM

## 2019-12-28 LAB — IRON AND TIBC
Iron: 69 ug/dL (ref 45–182)
Saturation Ratios: 31 % (ref 17.9–39.5)
TIBC: 225 ug/dL — ABNORMAL LOW (ref 250–450)
UIBC: 156 ug/dL

## 2019-12-28 LAB — VITAMIN B12: Vitamin B-12: 200 pg/mL (ref 180–914)

## 2019-12-28 LAB — FERRITIN: Ferritin: 1047 ng/mL — ABNORMAL HIGH (ref 24–336)

## 2019-12-28 LAB — VITAMIN D 25 HYDROXY (VIT D DEFICIENCY, FRACTURES): Vit D, 25-Hydroxy: 9.82 ng/mL — ABNORMAL LOW (ref 30–100)

## 2019-12-28 LAB — MRSA PCR SCREENING: MRSA by PCR: NEGATIVE

## 2019-12-28 SURGERY — THORACIC LAMINECTOMY FOR TUMOR
Anesthesia: General

## 2019-12-28 MED ORDER — PROPOFOL 10 MG/ML IV BOLUS
INTRAVENOUS | Status: AC
  Filled 2019-12-28: qty 40

## 2019-12-28 MED ORDER — VITAMIN D (ERGOCALCIFEROL) 1.25 MG (50000 UNIT) PO CAPS
50000.0000 [IU] | ORAL_CAPSULE | ORAL | Status: DC
  Filled 2019-12-28: qty 1

## 2019-12-28 MED ORDER — MIDAZOLAM HCL 2 MG/2ML IJ SOLN
INTRAMUSCULAR | Status: AC
  Filled 2019-12-28: qty 2

## 2019-12-28 MED ORDER — POLYETHYLENE GLYCOL 3350 17 G PO PACK: 17.0000 g | PACK | Freq: Every day | ORAL | Status: DC | PRN

## 2019-12-28 MED ORDER — ONDANSETRON HCL 4 MG/2ML IJ SOLN
INTRAMUSCULAR | Status: AC
  Filled 2019-12-28: qty 2

## 2019-12-28 MED ORDER — FENTANYL CITRATE (PF) 250 MCG/5ML IJ SOLN
INTRAMUSCULAR | Status: AC
  Filled 2019-12-28: qty 5

## 2019-12-28 MED ORDER — OXYCODONE HCL 5 MG PO TABS
5.0000 mg | ORAL_TABLET | ORAL | Status: DC | PRN
  Administered 2019-12-30 – 2020-01-02 (×2): 5 mg via ORAL
  Filled 2019-12-28 (×2): qty 1

## 2019-12-28 MED ORDER — ACETAMINOPHEN 325 MG PO TABS
650.0000 mg | ORAL_TABLET | Freq: Four times a day (QID) | ORAL | Status: DC | PRN
  Administered 2019-12-30: 650 mg via ORAL
  Filled 2019-12-28: qty 2

## 2019-12-28 MED ORDER — CHLORHEXIDINE GLUCONATE CLOTH 2 % EX PADS
6.0000 | MEDICATED_PAD | Freq: Every day | CUTANEOUS | Status: DC
  Administered 2019-12-28 – 2020-01-03 (×7): 6 via TOPICAL

## 2019-12-28 MED ORDER — ESMOLOL HCL 100 MG/10ML IV SOLN
INTRAVENOUS | Status: AC
  Filled 2019-12-28: qty 10

## 2019-12-28 MED ORDER — DEXAMETHASONE SODIUM PHOSPHATE 10 MG/ML IJ SOLN
INTRAMUSCULAR | Status: AC
  Filled 2019-12-28: qty 1

## 2019-12-28 MED ORDER — SENNOSIDES-DOCUSATE SODIUM 8.6-50 MG PO TABS
2.0000 | ORAL_TABLET | Freq: Every evening | ORAL | Status: DC | PRN
  Filled 2019-12-28: qty 2

## 2019-12-28 MED ORDER — ROCURONIUM BROMIDE 10 MG/ML (PF) SYRINGE
PREFILLED_SYRINGE | INTRAVENOUS | Status: AC
  Filled 2019-12-28: qty 10

## 2019-12-28 MED ORDER — LIDOCAINE 2% (20 MG/ML) 5 ML SYRINGE
INTRAMUSCULAR | Status: AC
  Filled 2019-12-28: qty 5

## 2019-12-28 NOTE — ED Notes (Signed)
[facility] at bedside 

## 2019-12-28 NOTE — Progress Notes (Signed)
Report called to Kim-RN at [facility]. Patient will be transported to room 1616 once radiation treatment is finished

## 2019-12-28 NOTE — Progress Notes (Signed)
[facility] transferred pt to [facility]

## 2019-12-28 NOTE — Plan of Care (Signed)

## 2019-12-28 NOTE — Progress Notes (Signed)
  NEUROSURGERY PROGRESS NOTE   No issues overnight. History reviewed with pt and his wife at bedside. Has had several weeks of subjective BLE weakness, difficulty walking.  EXAM:  BP 125/67 (BP Location: Right Arm)   Pulse 98   Temp 98.4 F (36.9 C) (Oral)   Resp 17   Ht 5\' 9"  (1.753 m)   Wt 62.1 kg   SpO2 100%   BMI 20.22 kg/m   Awake, alert, oriented  Speech fluent, appropriate  CN grossly intact  5/5 BUE 2/3 proximal BLE, 4-5/5 quad and distal BLE  IMAGING: MRI reviewed demonstrating circumferential epidural tumor from T7-T9, although there is minimal cord deformation.  IMPRESSION:  74 y.o. male with end-stage prostate CA and BLE weakness related to T7-T9 epidural mets. Given the end-stage nature of his disease and significant comorbidities/medical condition as well as the fact that recovery from surgery would not be trivial, I think that primary palliative radiation is the best option in this case rather than surgical decompression followed by radiation.  PLAN: - I have spoken with the rad-onc team at [facility]. They are planning on transfer to [facility] to start palliative radiation.  I have reviewed the situation with the patient and his wife. We discussed the options of surgery + radiation vs radiation alone. After d/w rad-onc, they are agreeable to primary palliative radiation. All their questions were answered.

## 2019-12-28 NOTE — Progress Notes (Signed)
Report called to Kim-RN at [facility]. Patient to be transported to room 1616 once radiation session finished.

## 2019-12-28 NOTE — Progress Notes (Signed)
PROGRESS NOTE    Alec Harris  [id number] DOB: Jan 12, 1946 DOA: 12/27/2019 PCP: Patient, No Pcp Per   Brief Narrative:  74 year old with metastatic prostate cancer to bone on the palliative radiation, PVD, carotid stenosis, HLD admitted for bilateral lower extremity weakness and recommendations of radiation oncology.  Progressively had difficulty with moving his lower extremities over the past month.  Denies any bowel bladder incontinence.  MRI spine showed widespread metastatic disease throughout cervical, thoracic and lumbar spine with epidural tumor T 7 - 9 causing cord deformity.  Neurosurgery consulted.  Started on Decadron.   Assessment & Plan:   Principal Problem:   Lower extremity weakness Active Problems:   Prostate cancer (HCC)   PVD (peripheral vascular disease) with claudication (HCC)   Hyperlipemia   Essential hypertension   Pressure injury of skin   BLE weakness with metastatic bone disease and new epidural tumor around cord of T7-T9 Metastatic prostatic adenocarcinoma.  Status post prostatectomy Case reviewed by neurosurgery, radiation oncology and oncology.  Patient will be transferred to [facility] for palliative radiation.  This radiation may help [place] get some mobility back with it will be curable. Palliative care team consulted to help establish goals of care. Decadron 4 mg every 6 hours Supportive care.  Pancytopenia with severe thrombocytopenia Bone marrow suppression from metastatic disease and radiation Will require platelet transfusion especially if undergoes spine surgery Check TSH, B12, folate, iron studies Hold aspirin Spoke with Dr. [name] Blew from oncology   elevated ALP Secondary to metastatic bone disease  Hypertension Continue amlodipine  Hyperlipidemia Continue statin  PVD Continue aspirin and Cilostazol  DVT prophylaxis: SCDs Code Status: DNR Family Communication: Wife at bedside  Status is: Inpatient  Remains  inpatient appropriate because:Inpatient level of care appropriate due to severity of illness   Dispo: The patient is from: Home              Anticipated d/c is to: Home              Anticipated d/c date is: > 3 days              Patient currently is not medically stable to d/c.  Patient needs to be transferred to [facility] for inpatient radiation treatment given his bilateral lower extremity weakness      Subjective: Seen and examined at bedside.  Wife at bedside.  Is having lower extremity weakness but denies bowel bladder incontinence.  Explained to him the importance of palliative care service they are agreeable to meet with them to help establish goals of care.  Review of Systems Otherwise negative except as per HPI, including: General: Denies fever, chills, night sweats or unintended weight loss. Resp: Denies cough, wheezing, shortness of breath. Cardiac: Denies chest pain, palpitations, orthopnea, paroxysmal nocturnal dyspnea. GI: Denies abdominal pain, nausea, vomiting, diarrhea or constipation GU: Denies dysuria, frequency, hesitancy or incontinence MS: Denies muscle aches, joint pain or swelling Neuro: Denies headache, neurologic deficits (focal weakness, numbness, tingling), abnormal gait Psych: Denies anxiety, depression, SI/HI/AVH Skin: Denies new rashes or lesions ID: Denies sick contacts, exotic exposures, travel  Examination:  General exam: Cachectic frail chronically ill-appearing, bilateral temporal wasting Respiratory system: Clear to auscultation. Respiratory effort normal. Cardiovascular system: S1 & S2 heard, RRR. No JVD, murmurs, rubs, gallops or clicks. No pedal edema. Gastrointestinal system: Abdomen is nondistended, soft and nontender. No organomegaly or masses felt. Normal bowel sounds heard. Central nervous system: Alert and oriented. No focal neurological deficits. Extremities: Symmetric 3  x 5 power. Skin: No rashes, lesions or  ulcers Psychiatry: Judgement and insight appear normal. Mood & affect appropriate.     Objective: Vitals:   12/27/19 2320 12/28/19 0056 12/28/19 0103 12/28/19 0321  BP: 122/69 (!) 141/68  133/62  Pulse: (!) 102 98  97  Resp: 18 18  18   Temp:  98.3 F (36.8 C)  97.8 F (36.6 C)  TempSrc:  Oral  Oral  SpO2: 97% 99%  98%  Weight:   62.1 kg   Height:   5\' 9"  (1.753 m)     Intake/Output Summary (Last 24 hours) at 12/28/2019 0752 Last data filed at 12/28/2019 0500 Gross per 24 hour  Intake --  Output 250 ml  Net -250 ml   Filed Weights   12/27/19 1248 12/28/19 0103  Weight: 72.6 kg 62.1 kg     Data Reviewed:   CBC: Recent Labs  Lab 12/27/19 1326  WBC 3.7*  NEUTROABS 2.8  HGB 8.0*  HCT 24.7*  MCV 103.8*  PLT 28*   Basic Metabolic Panel: Recent Labs  Lab 12/27/19 1326  NA 134*  K 3.9  CL 102  CO2 21*  GLUCOSE 111*  BUN 13  CREATININE 0.64  CALCIUM 8.0*   GFR: Estimated Creatinine Clearance: 69 mL/min (by C-G formula based on SCr of 0.64 mg/dL). Liver Function Tests: Recent Labs  Lab 12/27/19 1326  AST 17  ALT 14  ALKPHOS 605*  BILITOT 1.7*  PROT 5.5*  ALBUMIN 2.7*   No results for input(s): LIPASE, AMYLASE in the last 168 hours. No results for input(s): AMMONIA in the last 168 hours. Coagulation Profile: No results for input(s): INR, PROTIME in the last 168 hours. Cardiac Enzymes: No results for input(s): CKTOTAL, CKMB, CKMBINDEX, TROPONINI in the last 168 hours. BNP (last 3 results) No results for input(s): PROBNP in the last 8760 hours. HbA1C: No results for input(s): HGBA1C in the last 72 hours. CBG: No results for input(s): GLUCAP in the last 168 hours. Lipid Profile: No results for input(s): CHOL, HDL, LDLCALC, TRIG, CHOLHDL, LDLDIRECT in the last 72 hours. Thyroid Function Tests: No results for input(s): TSH, T4TOTAL, FREET4, T3FREE, THYROIDAB in the last 72 hours. Anemia Panel: No results for input(s): VITAMINB12, FOLATE, FERRITIN,  TIBC, IRON, RETICCTPCT in the last 72 hours. Sepsis Labs: No results for input(s): PROCALCITON, LATICACIDVEN in the last 168 hours.  Recent Results (from the past 240 hour(s))  SARS Coronavirus 2 by RT PCR (hospital order, performed in [facility] hospital lab) Nasopharyngeal Nasopharyngeal Swab     Status: None   Collection Time: 12/27/19  7:36 PM   Specimen: Nasopharyngeal Swab  Result Value Ref Range Status   SARS Coronavirus 2 NEGATIVE NEGATIVE Final    Comment: (NOTE) SARS-CoV-2 target nucleic acids are NOT DETECTED. The SARS-CoV-2 RNA is generally detectable in upper and lower respiratory specimens during the acute phase of infection. The lowest concentration of SARS-CoV-2 viral copies this assay can detect is 250 copies / mL. A negative result does not preclude SARS-CoV-2 infection and should not be used as the sole basis for treatment or other patient management decisions.  A negative result may occur with improper specimen collection / handling, submission of specimen other than nasopharyngeal swab, presence of viral mutation(s) within the areas targeted by this assay, and inadequate number of viral copies (<250 copies / mL). A negative result must be combined with clinical observations, patient history, and epidemiological information. Fact Sheet for Patients:   StrictlyIdeas.no Fact Sheet  for Healthcare Providers: BankingDealers.co.za This test is not yet approved or cleared  by the [place] and has been authorized for detection and/or diagnosis of SARS-CoV-2 by FDA under an Emergency Use Authorization (EUA).  This EUA will remain in effect (meaning this test can be used) for the duration of the COVID-19 declaration under Section 564(b)(1) of the Act, 21 U.S.C. section 360bbb-3(b)(1), unless the authorization is terminated or revoked sooner. Performed at [facility], [place] [address], [place], [place] [address]   MRSA PCR Screening     Status: None   Collection Time: 12/28/19  6:04 AM   Specimen: Nasal Mucosa; Nasopharyngeal  Result Value Ref Range Status   MRSA by PCR NEGATIVE NEGATIVE Final    Comment:        The GeneXpert MRSA Assay (FDA approved for NASAL specimens only), is one component of a comprehensive MRSA colonization surveillance program. It is not intended to diagnose MRSA infection nor to guide or monitor treatment for MRSA infections. Performed at [facility], [place] [address] [place], [place] [address]          Radiology Studies: MR TOTAL SPINE METS SCREENING  Result Date: 12/27/2019 CLINICAL DATA:  Myelopathy. Metastatic prostate cancer. Increased weakness and numbness of legs. EXAM: MRI TOTAL SPINE WITHOUT AND WITH CONTRAST TECHNIQUE: Multisequence MR imaging of the spine from the cervical spine to the sacrum was performed prior to and following IV contrast administration for evaluation of spinal metastatic disease. CONTRAST:  7.77mL GADAVIST GADOBUTROL 1 MMOL/ML IV SOLN COMPARISON:  None. FINDINGS: MRI CERVICAL SPINE FINDINGS Alignment: Normal Vertebrae: ACDF with anterior plate at 075-GRM. Metastatic disease C5, C6, and C7 vertebral bodies extending into the posterior elements. Negative for fracture Cord: Hyperintensity in the cord at C3-4 compatible with chronic myelomalacia. This may be related to prior spinal stenosis. Spinal canal is adequate in size at this time. Posterior Fossa, vertebral arteries, paraspinal tissues: Negative Disc levels: Disc degeneration and spurring  C5-6 and C6-7.  No cord compression. Image quality degraded by motion throughout all of the study. MRI THORACIC SPINE FINDINGS Alignment:  Normal Vertebrae: Widespread metastatic disease throughout the vertebral bodies and posterior elements of the entire thoracic spine. There is enhancing epidural tumor in the canal at T7 to T9 causing cord deformity and spinal stenosis.  No frank cord compression. Limited anatomic detail due to motion. Cord: Normal cord signal. Epidural tumor surrounding the cord T7 through T9 Paraspinal and other soft tissues: No paraspinous mass. Bibasilar atelectasis. Disc levels: Limited evaluation of the disc spaces. There is mild disc degeneration in the thoracic spine without disc protrusion identified. MRI LUMBAR SPINE FINDINGS Segmentation: L5 is sacralized. There are 4 non-rib-bearing lumbar segments. Alignment:  Normal Vertebrae: Widespread metastatic disease throughout the vertebral bodies and posterior elements throughout the lumbar spine extending into the thoracic spine. There is a mild fracture of L1 which appears chronic. Conus medullaris: Extends to the L1-2 level and appears normal. Paraspinal and other soft tissues: Negative Disc levels: Mild disc degeneration at multiple levels. No disc protrusion or spinal stenosis. No epidural tumor in the lumbar canal. IMPRESSION: This study is degraded by significant motion Widespread metastatic disease throughout the cervical, thoracic, and lumbar spine. Epidural tumor in the canal surrounding the cord at T7 through T9 causing cord deformity and mild spinal stenosis. Chronic myelomalacia in the cervical spinal cord at C3-4 at the site of prior ACDF. Electronically Signed   By: Franchot Gallo M.D.  On: 12/27/2019 19:27        Scheduled Meds: . amLODipine  5 mg Oral Daily  . aspirin EC  81 mg Oral Daily  . atorvastatin  20 mg Oral Daily  . cilostazol  100 mg Oral BID  . dexamethasone (DECADRON) injection  4 mg Intravenous Q6H  . megestrol  400 mg Oral Daily  . mirtazapine  7.5 mg Oral QHS   Continuous Infusions:   LOS: 1 day   Time spent= 35 mins    Jon Kasparek Arsenio Loader, MD [facility]  If 7PM-7AM, please contact night-coverage  12/28/2019, 7:52 AM

## 2019-12-28 NOTE — Progress Notes (Signed)
Events noted. Alec Harris is known to me with history of prostate cancer with multiple therapies in the past outlined including chemotherapy radiation.  He was hospitalized with bilateral lower extremity weakness and MRI of the spine showed widespread metastasis with epidural tumor involvement around T7 and T9.  He was started on steroids and under consideration for possible surgical resection.   Laboratory data on Dec 27, 2019 were reviewed and showed thrombocytopenia associated with mild anemia.  His platelet count around 28 hemoglobin of 8.0.  He has normal kidney function.   Assessment and recommendations:  74 year old gentleman with prostate cancer and status post multiple therapies approaching end-stage status unfortunately.  He has exhausted multiple therapies and no plan for any additional anticancer treatment unless used for palliative pain purposes.  He has widespread metastatic disease with epidural compression around T7 and T9 with a clear neurological deficits.  His overall prognosis is poor from his prostate cancer and surgery might offer some palliation but it will occur in the setting of end-stage disease.  I do not have any objection to surgery if it is felt can be done.  Platelet transfusion in preparation for surgery would be needed to boost his platelet count to decrease risk of bleeding.  His pancytopenia is likely related to infiltrative prostate cancer  I agree with no CODE BLUE status is all as palliative medicine involvement goals of care.

## 2019-12-28 NOTE — ED Notes (Signed)
Report given to David with [facility] 

## 2019-12-28 NOTE — Progress Notes (Signed)
  Radiation Oncology         [phone] ________________________________  Name: Alec Harris MRN: [id number]  Date: 12/27/2019  DOB: 10-03-1945  Chart Note:  I reviewed this patient's most recent findings and wanted to take a minute to document my impression.  This is a very nice 74 year old gentleman well-known to me from previous courses of external radiation and Xofigo.  He has become nonambulatory within the last 48 hours due to spinal cord compression from involvement at the T7-T9 level.  Unfortunately, Xofigo therapy is only effective within the bone and does not slow the progression of extraosseous disease, as we see in his thoracic spine.  Given the sudden onset of leg weakness, I would highly recommend a brief course of palliative radiotherapy in hopes of regaining or at least preserving existing neurologic function of the lower extremities.  While the patient's overall survival may be limited to weeks or months, his quality of life would be somewhat preserved if he is able to assist in transfer and maintain bowel and bladder continence.  As such, we will request immediate transfer to [facility] for emergent initiation of radiation treatment today.   ________________________________  Alec Harris, M.D.

## 2019-12-28 NOTE — Progress Notes (Signed)
Patient seen and examined today.  Please see previous note for my recommendations. Imaging studies were reviewed personally and discussed with the patient and his wife was present at bedside.  I reiterated to these findings today which include a rapid progression of his disease at this time.  He is approaching end-stage prostate cancer and any treatment would offer some palliation of his pain and mobility it would be reasonable to do.  He understands that no additional anticancer therapy will be prescribed.  Transitioning to hospice would be his next step upon completing radiation.   His prognosis is poor with limited life expectancy  25  minutes were dedicated to this visit.  More than 50% of the time was spent face-to-face and the time was spent on reviewing laboratory data, imaging studies, discussing treatment options and discussing prognosis.

## 2019-12-29 ENCOUNTER — Inpatient Hospital Stay: Payer: Medicare Other | Admitting: Oncology

## 2019-12-29 ENCOUNTER — Ambulatory Visit
Admission: RE | Admit: 2019-12-29 | Discharge: 2019-12-29 | Disposition: A | Discharge status: Home / Self Care | Payer: Medicare Other | Source: Ambulatory Visit | Attending: Radiation Oncology | Admitting: Radiation Oncology

## 2019-12-29 ENCOUNTER — Ambulatory Visit: Payer: Medicare Other | Admitting: Radiation Oncology

## 2019-12-29 ENCOUNTER — Inpatient Hospital Stay: Payer: Medicare Other

## 2019-12-29 DIAGNOSIS — G9529 Other cord compression: Secondary | ICD-10-CM

## 2019-12-29 DIAGNOSIS — C61 Malignant neoplasm of prostate: Secondary | ICD-10-CM

## 2019-12-29 LAB — BASIC METABOLIC PANEL
Anion gap: 11 (ref 5–15)
BUN: 22 mg/dL (ref 8–23)
CO2: 21 mmol/L — ABNORMAL LOW (ref 22–32)
Calcium: 8.3 mg/dL — ABNORMAL LOW (ref 8.9–10.3)
Chloride: 105 mmol/L (ref 98–111)
Creatinine, Ser: 0.59 mg/dL — ABNORMAL LOW (ref 0.61–1.24)
GFR calc Af Amer: >60 mL/min (ref >60)
GFR calc non Af Amer: >60 mL/min (ref >60)
Glucose, Bld: 132 mg/dL — ABNORMAL HIGH (ref 70–99)
Potassium: 4.2 mmol/L (ref 3.5–5.1)
Sodium: 137 mmol/L (ref 135–145)

## 2019-12-29 LAB — CBC
HCT: 24.7 % — ABNORMAL LOW (ref 39.0–52.0)
Hemoglobin: 7.9 g/dL — ABNORMAL LOW (ref 13.0–17.0)
MCH: 33.9 pg (ref 26.0–34.0)
MCHC: 32 g/dL (ref 30.0–36.0)
MCV: 106 fL — ABNORMAL HIGH (ref 80.0–100.0)
Platelets: 26 10*3/uL — CL (ref 150–400)
RBC: 2.33 MIL/uL — ABNORMAL LOW (ref 4.22–5.81)
RDW: 20.9 % — ABNORMAL HIGH (ref 11.5–15.5)
WBC: 3.5 10*3/uL — ABNORMAL LOW (ref 4.0–10.5)
nRBC: 5.2 % — ABNORMAL HIGH (ref 0.0–0.2)

## 2019-12-29 LAB — MAGNESIUM: Magnesium: 2.2 mg/dL (ref 1.7–2.4)

## 2019-12-29 LAB — FOLATE RBC
Folate, Hemolysate: 306 ng/mL
Folate, RBC: 1249 ng/mL (ref >498)
Hematocrit: 24.5 % — ABNORMAL LOW (ref 37.5–51.0)

## 2019-12-29 NOTE — Progress Notes (Addendum)
[name]  PROGRESS NOTE    Alec Harris  [id number] DOB: 03/28/1946 DOA: 12/27/2019 PCP: Patient, No Pcp Per   Brief Narrative:   74 year old with metastatic prostate cancer to bone on the palliative radiation, PVD, carotid stenosis, HLD admitted for bilateral lower extremity weakness and recommendations of radiation oncology.  Progressively had difficulty with moving his lower extremities over the past month.  Denies any bowel bladder incontinence.  MRI spine showed widespread metastatic disease throughout cervical, thoracic and lumbar spine with epidural tumor T 7 - 9 causing cord deformity.  Neurosurgery consulted.  Started on Decadron.  5/13: Pt presenting for palliative radiation. Spoke with wife/pt about palliative care/hospice, but they seem confused on the subject. The patient asked what was the purpose of this radiation as if he was not under the understanding that this is palliative therapy. Palliative care has been consulted.    Assessment & Plan:   Principal Problem:   Lower extremity weakness Active Problems:   Prostate cancer (HCC)   PVD (peripheral vascular disease) with claudication (HCC)   Hyperlipemia   Essential hypertension  BLE weakness with metastatic bone disease and new epidural tumor around cord of T7-T9 Metastatic prostatic adenocarcinoma; s/p prostatectomy     - Case reviewed by neurosurgery, radiation oncology and oncology.     - Pt has been transferred to [facility] for palliative radiation     - 5/13: have reviewed chart; per onco " I reiterated to these findings today which include a rapid progression of his disease at this time.  He is approaching end-stage prostate cancer and any treatment would offer some palliation of his pain and mobility it would be reasonable to do.He understands that no additional anticancer therapy will be prescribed.  Transitioning to hospice would be his next step upon completing radiation. His prognosis is poor with limited life expectancy"     - per neurosurg: "Given the end-stage nature of his disease and significant comorbidities/medical condition as well as the fact that recovery from surgery would not be trivial, I think that primary palliative radiation is the best option in this case rather than surgical decompression followed by radiation. I have reviewed the situation with the patient and his wife. We discussed the options of surgery + radiation vs radiation alone. After d/w rad-onc, they are agreeable to primary palliative radiation. All their questions were answered."     - Palliative care team consulted to help establish goals of care.     - continue decadron 4 mg q6h  Pancytopenia with severe thrombocytopenia     - Bone marrow suppression from metastatic disease and radiation     - Dr. [name] Blew onboard, will differ need to plt transfusion to him     - ASA was held and Cilostazol continued     - 5/13: labs are essentially stable from yesterday; continue to follow     - hold pletal  Elevated ALP     - Secondary to metastatic bone disease; see above  Hypertension     - Continue amlodipine     - 5/13: BP is ok this AM, continue as above  Hyperlipidemia     - Continue statin  PVD     - ASA was held and Cilostazol continued     - 5/13: hold pletal for thrombocytopenia  DVT prophylaxis: SCDs Code Status: [facility] Family Communication: Spoke with wife at bedside.   Status is: Inpatient  Remains inpatient appropriate because:Ongoing active pain requiring inpatient pain management   Dispo:  The patient is from: Home              Anticipated d/c is to: Home              Anticipated d/c date is: > 3 days              Patient currently is not medically stable to d/c.  Consultants:   Neurosurgery  Oncology  Radiation oncology  Palliative care  Procedures:   Radiotherapy   ROS:  Denies dyspnea, CP, palpitations . Remainder 10-pt ROS is negative for all not previously mentioned.  Subjective: "What are  we doing this for then?"  Objective: Vitals:   12/29/19 0236 12/29/19 0240 12/29/19 0608 12/29/19 1331  BP: 132/71  134/72 125/67  Pulse: (!) 111 (!) 107 (!) 107 (!) 110  Resp: 14  14 16   Temp: (!) 97.5 F (36.4 C)  (!) 97.5 F (36.4 C) 97.9 F (36.6 C)  TempSrc: Oral  Oral Oral  SpO2: 97%  97% 97%  Weight:      Height:        Intake/Output Summary (Last 24 hours) at 12/29/2019 1456 Last data filed at 12/29/2019 1429 Gross per 24 hour  Intake 240 ml  Output 700 ml  Net -460 ml   Filed Weights   12/27/19 1248 12/28/19 0103 12/28/19 1732  Weight: 72.6 kg 62.1 kg 62.7 kg    Examination:  General: 74 y.o. male resting in bed in NAD Cardiovascular: RRR, +S1, S2, no m/g/r, equal pulses throughout Respiratory: CTABL, no w/r/r, normal WOB GI: BS+, NDNT, no masses noted, no organomegaly noted MSK: No e/c/c Neuro: alert to name, follows commands Psyc: Appropriate interaction and affect, calm/cooperative   Data Reviewed: I have personally reviewed following labs and imaging studies.  CBC: Recent Labs  Lab 12/27/19 1326 12/28/19 0811 12/29/19 0603  WBC 3.7*  --  3.5*  NEUTROABS 2.8  --   --   HGB 8.0*  --  7.9*  HCT 24.7* 24.5* 24.7*  MCV 103.8*  --  106.0*  PLT 28*  --  26*   Basic Metabolic Panel: Recent Labs  Lab 12/27/19 1326 12/29/19 0603  NA 134* 137  K 3.9 4.2  CL 102 105  CO2 21* 21*  GLUCOSE 111* 132*  BUN 13 22  CREATININE 0.64 0.59*  CALCIUM 8.0* 8.3*  MG  --  2.2   GFR: Estimated Creatinine Clearance: 69.7 mL/min (A) (by C-G formula based on SCr of 0.59 mg/dL (L)). Liver Function Tests: Recent Labs  Lab 12/27/19 1326  AST 17  ALT 14  ALKPHOS 605*  BILITOT 1.7*  PROT 5.5*  ALBUMIN 2.7*   No results for input(s): LIPASE, AMYLASE in the last 168 hours. No results for input(s): AMMONIA in the last 168 hours. Coagulation Profile: No results for input(s): INR, PROTIME in the last 168 hours. Cardiac Enzymes: No results for input(s):  CKTOTAL, CKMB, CKMBINDEX, TROPONINI in the last 168 hours. BNP (last 3 results) No results for input(s): PROBNP in the last 8760 hours. HbA1C: No results for input(s): HGBA1C in the last 72 hours. CBG: No results for input(s): GLUCAP in the last 168 hours. Lipid Profile: No results for input(s): CHOL, HDL, LDLCALC, TRIG, CHOLHDL, LDLDIRECT in the last 72 hours. Thyroid Function Tests: No results for input(s): TSH, T4TOTAL, FREET4, T3FREE, THYROIDAB in the last 72 hours. Anemia Panel: Recent Labs    12/28/19 0811  VITAMINB12 200  FERRITIN 1,047*  TIBC 225*  IRON 69  Sepsis Labs: No results for input(s): PROCALCITON, LATICACIDVEN in the last 168 hours.  Recent Results (from the past 240 hour(s))  SARS Coronavirus 2 by RT PCR (hospital order, performed in [facility] hospital lab) Nasopharyngeal Nasopharyngeal Swab     Status: None   Collection Time: 12/27/19  7:36 PM   Specimen: Nasopharyngeal Swab  Result Value Ref Range Status   SARS Coronavirus 2 NEGATIVE NEGATIVE Final    Comment: (NOTE) SARS-CoV-2 target nucleic acids are NOT DETECTED. The SARS-CoV-2 RNA is generally detectable in upper and lower respiratory specimens during the acute phase of infection. The lowest concentration of SARS-CoV-2 viral copies this assay can detect is 250 copies / mL. A negative result does not preclude SARS-CoV-2 infection and should not be used as the sole basis for treatment or other patient management decisions.  A negative result may occur with improper specimen collection / handling, submission of specimen other than nasopharyngeal swab, presence of viral mutation(s) within the areas targeted by this assay, and inadequate number of viral copies (<250 copies / mL). A negative result must be combined with clinical observations, patient history, and epidemiological information. Fact Sheet for Patients:   StrictlyIdeas.no Fact Sheet for Healthcare  Providers: BankingDealers.co.za This test is not yet approved or cleared  by the [place] FDA and has been authorized for detection and/or diagnosis of SARS-CoV-2 by FDA under an Emergency Use Authorization (EUA).  This EUA will remain in effect (meaning this test can be used) for the duration of the COVID-19 declaration under Section 564(b)(1) of the Act, 21 U.S.C. section 360bbb-3(b)(1), unless the authorization is terminated or revoked sooner. Performed at [facility], [place] [address] [place], [place] [address]   MRSA PCR Screening     Status: None   Collection Time: 12/28/19  6:04 AM   Specimen: Nasal Mucosa; Nasopharyngeal  Result Value Ref Range Status   MRSA by PCR NEGATIVE NEGATIVE Final    Comment:        The GeneXpert MRSA Assay (FDA approved for NASAL specimens only), is one component of a comprehensive MRSA colonization surveillance program. It is not intended to diagnose MRSA infection nor to guide or monitor treatment for MRSA infections. Performed at [facility], [place] [address] [place], [place] [address]       Radiology Studies: MR TOTAL SPINE METS SCREENING  Result Date: 12/27/2019 CLINICAL DATA:  Myelopathy. Metastatic prostate cancer. Increased weakness and numbness of legs. EXAM: MRI TOTAL SPINE WITHOUT AND WITH CONTRAST TECHNIQUE: Multisequence MR imaging of the spine from the cervical spine to the sacrum was performed prior to and following IV contrast administration for evaluation of spinal metastatic disease. CONTRAST:  7.15mL GADAVIST GADOBUTROL 1 MMOL/ML IV SOLN COMPARISON:  None. FINDINGS: MRI CERVICAL SPINE FINDINGS Alignment: Normal Vertebrae: ACDF with anterior plate at 075-GRM. Metastatic disease C5, C6, and C7 vertebral bodies extending into the posterior elements. Negative for fracture Cord: Hyperintensity in the cord at C3-4 compatible with chronic myelomalacia. This may be related to prior spinal  stenosis. Spinal canal is adequate in size at this time. Posterior Fossa, vertebral arteries, paraspinal tissues: Negative Disc levels: Disc degeneration and spurring  C5-6 and C6-7.  No cord compression. Image quality degraded by motion throughout all of the study. MRI THORACIC SPINE FINDINGS Alignment:  Normal Vertebrae: Widespread metastatic disease throughout the vertebral bodies and posterior elements of the entire thoracic spine. There is enhancing epidural tumor in the canal at T7 to T9 causing cord deformity  and spinal stenosis. No frank cord compression. Limited anatomic detail due to motion. Cord: Normal cord signal. Epidural tumor surrounding the cord T7 through T9 Paraspinal and other soft tissues: No paraspinous mass. Bibasilar atelectasis. Disc levels: Limited evaluation of the disc spaces. There is mild disc degeneration in the thoracic spine without disc protrusion identified. MRI LUMBAR SPINE FINDINGS Segmentation: L5 is sacralized. There are 4 non-rib-bearing lumbar segments. Alignment:  Normal Vertebrae: Widespread metastatic disease throughout the vertebral bodies and posterior elements throughout the lumbar spine extending into the thoracic spine. There is a mild fracture of L1 which appears chronic. Conus medullaris: Extends to the L1-2 level and appears normal. Paraspinal and other soft tissues: Negative Disc levels: Mild disc degeneration at multiple levels. No disc protrusion or spinal stenosis. No epidural tumor in the lumbar canal. IMPRESSION: This study is degraded by significant motion Widespread metastatic disease throughout the cervical, thoracic, and lumbar spine. Epidural tumor in the canal surrounding the cord at T7 through T9 causing cord deformity and mild spinal stenosis. Chronic myelomalacia in the cervical spinal cord at C3-4 at the site of prior ACDF. Electronically Signed   By: Franchot Gallo M.D.   On: 12/27/2019 19:27     Scheduled Meds: . amLODipine  5 mg Oral Daily   . atorvastatin  20 mg Oral Daily  . Chlorhexidine Gluconate Cloth  6 each Topical Daily  . cilostazol  100 mg Oral BID  . dexamethasone (DECADRON) injection  4 mg Intravenous Q6H  . megestrol  400 mg Oral Daily  . mirtazapine  7.5 mg Oral QHS  . Vitamin D (Ergocalciferol)  50,000 Units Oral Q Wed   Continuous Infusions:   LOS: 2 days    Time spent: 35 minutes spent in the coordination of care today.    Jonnie Finner, DO [facility]  If 7PM-7AM, please contact night-coverage [email or web address] 12/29/2019, 2:56 PM

## 2019-12-30 ENCOUNTER — Ambulatory Visit: Payer: Medicare Other

## 2019-12-30 ENCOUNTER — Ambulatory Visit: Admission: RE | Admit: 2019-12-30 | Payer: Medicare Other | Source: Ambulatory Visit

## 2019-12-30 ENCOUNTER — Ambulatory Visit
Admission: RE | Admit: 2019-12-30 | Discharge: 2019-12-30 | Disposition: A | Discharge status: Home / Self Care | Payer: Medicare Other | Source: Ambulatory Visit | Attending: Radiation Oncology | Admitting: Radiation Oncology

## 2019-12-30 DIAGNOSIS — R531 Weakness: Secondary | ICD-10-CM

## 2019-12-30 DIAGNOSIS — K5900 Constipation, unspecified: Secondary | ICD-10-CM

## 2019-12-30 DIAGNOSIS — Z7189 Other specified counseling: Secondary | ICD-10-CM

## 2019-12-30 DIAGNOSIS — Z515 Encounter for palliative care: Secondary | ICD-10-CM

## 2019-12-30 LAB — RENAL FUNCTION PANEL
Albumin: 3 g/dL — ABNORMAL LOW (ref 3.5–5.0)
Anion gap: 10 (ref 5–15)
BUN: 29 mg/dL — ABNORMAL HIGH (ref 8–23)
CO2: 21 mmol/L — ABNORMAL LOW (ref 22–32)
Calcium: 8 mg/dL — ABNORMAL LOW (ref 8.9–10.3)
Chloride: 106 mmol/L (ref 98–111)
Creatinine, Ser: 0.87 mg/dL (ref 0.61–1.24)
GFR calc Af Amer: >60 mL/min (ref >60)
GFR calc non Af Amer: >60 mL/min (ref >60)
Glucose, Bld: 125 mg/dL — ABNORMAL HIGH (ref 70–99)
Phosphorus: 2.9 mg/dL (ref 2.5–4.6)
Potassium: 4.3 mmol/L (ref 3.5–5.1)
Sodium: 137 mmol/L (ref 135–145)

## 2019-12-30 LAB — CBC
HCT: 25.3 % — ABNORMAL LOW (ref 39.0–52.0)
Hemoglobin: 8 g/dL — ABNORMAL LOW (ref 13.0–17.0)
MCH: 33.6 pg (ref 26.0–34.0)
MCHC: 31.6 g/dL (ref 30.0–36.0)
MCV: 106.3 fL — ABNORMAL HIGH (ref 80.0–100.0)
Platelets: 28 10*3/uL — CL (ref 150–400)
RBC: 2.38 MIL/uL — ABNORMAL LOW (ref 4.22–5.81)
RDW: 20.9 % — ABNORMAL HIGH (ref 11.5–15.5)
WBC: 4.3 10*3/uL (ref 4.0–10.5)
nRBC: 11.1 % — ABNORMAL HIGH (ref 0.0–0.2)

## 2019-12-30 LAB — MAGNESIUM: Magnesium: 2.3 mg/dL (ref 1.7–2.4)

## 2019-12-30 MED ORDER — LIP MEDEX EX OINT
TOPICAL_OINTMENT | CUTANEOUS | Status: AC
  Filled 2019-12-30: qty 7

## 2019-12-30 MED ORDER — MELATONIN 5 MG PO TABS
5.0000 mg | ORAL_TABLET | Freq: Every day | ORAL | Status: DC
  Administered 2019-12-30 – 2020-01-02 (×4): 5 mg via ORAL
  Filled 2019-12-30 (×4): qty 1

## 2019-12-30 MED ORDER — ENSURE ENLIVE PO LIQD
237.0000 mL | Freq: Two times a day (BID) | ORAL | Status: DC
  Administered 2019-12-30 – 2020-01-02 (×4): 237 mL via ORAL

## 2019-12-30 MED ORDER — DOCUSATE SODIUM 100 MG PO CAPS
100.0000 mg | ORAL_CAPSULE | Freq: Two times a day (BID) | ORAL | Status: DC
  Administered 2019-12-31 – 2020-01-03 (×7): 100 mg via ORAL
  Filled 2019-12-30 (×8): qty 1

## 2019-12-30 MED ORDER — ADULT MULTIVITAMIN W/MINERALS CH
1.0000 | ORAL_TABLET | Freq: Every day | ORAL | Status: DC
  Administered 2019-12-31 – 2020-01-03 (×4): 1 via ORAL
  Filled 2019-12-30 (×5): qty 1

## 2019-12-30 MED ORDER — BISACODYL 10 MG RE SUPP: 10.0000 mg | Freq: Every day | RECTAL | Status: DC | PRN

## 2019-12-30 NOTE — Progress Notes (Signed)
[name]  PROGRESS NOTE    Alec Harris  [id number] DOB: 08-17-1946 DOA: 12/27/2019 PCP: Patient, No Pcp Per   Brief Narrative:   74 year old with metastatic prostate cancer to bone on the palliative radiation, PVD, carotid stenosis, HLD admitted for bilateral lower extremity weakness and recommendations of radiation oncology. Progressively had difficulty with moving his lower extremities over the past month. Denies any bowel bladder incontinence. MRI spine showed widespread metastatic disease throughout cervical, thoracic and lumbar spine with epidural tumor T7-9 causing cord deformity. Neurosurgery consulted. Started on Decadron.  5/13: Pt presenting for palliative radiation. Spoke with wife/pt about palliative care/hospice, but they seem confused on the subject. The patient asked what was the purpose of this radiation as if he was not under the understanding that this is palliative therapy. Palliative care has been consulted.   5/14: Continuing palliative XRT. Add BM regimen. Home with hospice after.    Assessment & Plan:   Principal Problem:   Lower extremity weakness Active Problems:   Prostate cancer (HCC)   PVD (peripheral vascular disease) with claudication (HCC)   Hyperlipemia   Essential hypertension  BLE weakness with metastatic bone disease and new epidural tumor around cord of T7-T9 Metastatic prostatic adenocarcinoma; s/p prostatectomy     - Case reviewed by neurosurgery, radiation oncology and oncology.     - Pt has been transferred to [facility] for palliative radiation     - 5/13: have reviewed chart; per onco " I reiterated to these findings today which include a rapid progression of his disease at this time.  He is approaching end-stage prostate cancer and any treatment would offer some palliation of his pain and mobility it would be reasonable to do.He understands that no additional anticancer therapy will be prescribed.  Transitioning to hospice would be his next step  upon completing radiation. His prognosis is poor with limited life expectancy"     - per neurosurg: "Given the end-stage nature of his disease and significant comorbidities/medical condition as well as the fact that recovery from surgery would not be trivial, I think that primary palliative radiation is the best option in this case rather than surgical decompression followed by radiation. I have reviewed the situation with the patient and his wife. We discussed the options of surgery + radiation vs radiation alone. After d/w rad-onc, they are agreeable to primary palliative radiation. All their questions were answered."     - Palliative care team consulted to help establish goals of care.     - continue decadron 4 mg q6h     - 5/14: continue palliative XRT. Home with hospice after.   Pancytopenia with severe thrombocytopenia     - Bone marrow suppression from metastatic disease and radiation     - Dr. [name] Blew onboard, will differ need to plt transfusion to him     - ASA was held and Cilostazol continued     - 5/13: labs are essentially stable from yesterday; continue to follow     - hold pletal  Elevated ALP     - Secondary to metastatic bone disease; see above  Hypertension     - Continue amlodipine     - 5/13: BP is ok this AM, continue as above     - 5/14: stable, continue as above.   Hyperlipidemia     - Continue statin  PVD     - ASA was held and Cilostazol continued     - 5/13: hold pletal for thrombocytopenia  Constipation     -  colace, miralax  DVT prophylaxis: SCDs Code Status: DNR Family Communication: Spoke with wife at bedside   Status is: Inpatient  Remains inpatient appropriate because:Ongoing active pain requiring inpatient pain management   Dispo: The patient is from: Home              Anticipated d/c is to: Home              Anticipated d/c date is: > 3 days              Patient currently is not medically stable to d/c.  Consultants:   Radiation  Oncology  Oncology  Palliative Care  Procedures:   XRT   ROS:  Denies CP, palpitaiton, N, V. Reports constipation . Remainder 10-pt ROS is negative for all not previously mentioned.  Subjective: "I might just want to go home."  Objective: Vitals:   12/30/19 0622 12/30/19 0846 12/30/19 1018 12/30/19 1524  BP: 123/77 126/74 (!) 152/77 134/79  Pulse: (!) 118 (!) 117 (!) 115 (!) 114  Resp: 18 18  17   Temp: 98.1 F (36.7 C) 97.7 F (36.5 C) 98.7 F (37.1 C) 98.1 F (36.7 C)  TempSrc: Oral Oral Axillary Oral  SpO2: 97% 98% 97% 96%  Weight:      Height:        Intake/Output Summary (Last 24 hours) at 12/30/2019 1616 Last data filed at 12/30/2019 [id number] Gross per 24 hour  Intake 240 ml  Output 100 ml  Net 140 ml   Filed Weights   12/27/19 1248 12/28/19 0103 12/28/19 1732  Weight: 72.6 kg 62.1 kg 62.7 kg    Examination:  General: 74 y.o. male resting in bed in NAD Cardiovascular: RRR, +S1, S2, no m/g/r, equal pulses throughout Respiratory: CTABL, no w/r/r, normal WOB GI: BS+, NDNT, no masses noted, no organomegaly noted MSK: No e/c/c Neuro: alert to name, follows commands Psyc: Appropriate interaction and affect, calm/cooperative   Data Reviewed: I have personally reviewed following labs and imaging studies.  CBC: Recent Labs  Lab 12/27/19 1326 12/28/19 0811 12/29/19 0603 12/30/19 0500  WBC 3.7*  --  3.5* 4.3  NEUTROABS 2.8  --   --   --   HGB 8.0*  --  7.9* 8.0*  HCT 24.7* 24.5* 24.7* 25.3*  MCV 103.8*  --  106.0* 106.3*  PLT 28*  --  26* 28*   Basic Metabolic Panel: Recent Labs  Lab 12/27/19 1326 12/29/19 0603 12/30/19 0500  NA 134* 137 137  K 3.9 4.2 4.3  CL 102 105 106  CO2 21* 21* 21*  GLUCOSE 111* 132* 125*  BUN 13 22 29*  CREATININE 0.64 0.59* 0.87  CALCIUM 8.0* 8.3* 8.0*  MG  --  2.2 2.3  PHOS  --   --  2.9   GFR: Estimated Creatinine Clearance: 64.1 mL/min (by C-G formula based on SCr of 0.87 mg/dL). Liver Function Tests: Recent  Labs  Lab 12/27/19 1326 12/30/19 0500  AST 17  --   ALT 14  --   ALKPHOS 605*  --   BILITOT 1.7*  --   PROT 5.5*  --   ALBUMIN 2.7* 3.0*   No results for input(s): LIPASE, AMYLASE in the last 168 hours. No results for input(s): AMMONIA in the last 168 hours. Coagulation Profile: No results for input(s): INR, PROTIME in the last 168 hours. Cardiac Enzymes: No results for input(s): CKTOTAL, CKMB, CKMBINDEX, TROPONINI in the last 168 hours. BNP (last 3 results) No results for  input(s): PROBNP in the last 8760 hours. HbA1C: No results for input(s): HGBA1C in the last 72 hours. CBG: No results for input(s): GLUCAP in the last 168 hours. Lipid Profile: No results for input(s): CHOL, HDL, LDLCALC, TRIG, CHOLHDL, LDLDIRECT in the last 72 hours. Thyroid Function Tests: No results for input(s): TSH, T4TOTAL, FREET4, T3FREE, THYROIDAB in the last 72 hours. Anemia Panel: Recent Labs    12/28/19 0811  VITAMINB12 200  FERRITIN 1,047*  TIBC 225*  IRON 69   Sepsis Labs: No results for input(s): PROCALCITON, LATICACIDVEN in the last 168 hours.  Recent Results (from the past 240 hour(s))  SARS Coronavirus 2 by RT PCR (hospital order, performed in [facility] hospital lab) Nasopharyngeal Nasopharyngeal Swab     Status: None   Collection Time: 12/27/19  7:36 PM   Specimen: Nasopharyngeal Swab  Result Value Ref Range Status   SARS Coronavirus 2 NEGATIVE NEGATIVE Final    Comment: (NOTE) SARS-CoV-2 target nucleic acids are NOT DETECTED. The SARS-CoV-2 RNA is generally detectable in upper and lower respiratory specimens during the acute phase of infection. The lowest concentration of SARS-CoV-2 viral copies this assay can detect is 250 copies / mL. A negative result does not preclude SARS-CoV-2 infection and should not be used as the sole basis for treatment or other patient management decisions.  A negative result may occur with improper specimen collection / handling, submission of  specimen other than nasopharyngeal swab, presence of viral mutation(s) within the areas targeted by this assay, and inadequate number of viral copies (<250 copies / mL). A negative result must be combined with clinical observations, patient history, and epidemiological information. Fact Sheet for Patients:   StrictlyIdeas.no Fact Sheet for Healthcare Providers: BankingDealers.co.za This test is not yet approved or cleared  by the [place] FDA and has been authorized for detection and/or diagnosis of SARS-CoV-2 by FDA under an Emergency Use Authorization (EUA).  This EUA will remain in effect (meaning this test can be used) for the duration of the COVID-19 declaration under Section 564(b)(1) of the Act, 21 U.S.C. section 360bbb-3(b)(1), unless the authorization is terminated or revoked sooner. Performed at [facility], [place] [address], [place], [place] [address]   MRSA PCR Screening     Status: None   Collection Time: 12/28/19  6:04 AM   Specimen: Nasal Mucosa; Nasopharyngeal  Result Value Ref Range Status   MRSA by PCR NEGATIVE NEGATIVE Final    Comment:        The GeneXpert MRSA Assay (FDA approved for NASAL specimens only), is one component of a comprehensive MRSA colonization surveillance program. It is not intended to diagnose MRSA infection nor to guide or monitor treatment for MRSA infections. Performed at [facility], [place] [address], [place], [place] [address]       Radiology Studies: No results found.   Scheduled Meds: . amLODipine  5 mg Oral Daily  . atorvastatin  20 mg Oral Daily  . Chlorhexidine Gluconate Cloth  6 each Topical Daily  . dexamethasone (DECADRON) injection  4 mg Intravenous Q6H  . feeding supplement (ENSURE ENLIVE)  237 mL Oral BID BM  . megestrol  400 mg Oral Daily  . melatonin  5 mg Oral QHS  . mirtazapine  7.5 mg Oral QHS  . multivitamin with minerals  1 tablet  Oral Daily  . Vitamin D (Ergocalciferol)  50,000 Units Oral Q Wed   Continuous Infusions:   LOS: 3 days    Time  spent: 25 minutes spent in the coordination of care today.    Jonnie Finner, DO [facility]  If 7PM-7AM, please contact night-coverage [email or web address] 12/30/2019, 4:16 PM

## 2019-12-30 NOTE — Plan of Care (Signed)
  Problem: Education: Goal: Knowledge of General Education information will improve Description: Including pain rating scale, medication(s)/side effects and non-pharmacologic comfort measures Outcome: Progressing   Problem: Health Behavior/Discharge Planning: Goal: Ability to manage health-related needs will improve Outcome: Progressing   Problem: Clinical Measurements: Goal: Ability to maintain clinical measurements within normal limits will improve Outcome: Progressing Goal: Will remain free from infection Outcome: Progressing Goal: Diagnostic test results will improve Outcome: Progressing Goal: Respiratory complications will improve Outcome: Progressing Goal: Cardiovascular complication will be avoided Outcome: Progressing   Problem: Nutrition: Goal: Adequate nutrition will be maintained Outcome: Progressing   Problem: Elimination: Goal: Will not experience complications related to urinary retention Outcome: Progressing   Problem: Pain Managment: Goal: General experience of comfort will improve Outcome: Progressing   Problem: Safety: Goal: Ability to remain free from injury will improve Outcome: Progressing   Problem: Skin Integrity: Goal: Risk for impaired skin integrity will decrease Outcome: Progressing   Problem: Activity: Goal: Risk for activity intolerance will decrease Outcome: Not Progressing   Problem: Coping: Goal: Level of anxiety will decrease Outcome: Not Progressing   Problem: Elimination: Goal: Will not experience complications related to bowel motility Outcome: Not Progressing

## 2019-12-30 NOTE — Progress Notes (Signed)
Initial Nutrition Assessment  INTERVENTION:   -Ensure Enlive po TID, each supplement provides 350 kcal and 20 grams of protein -Multivitamin with minerals daily  NUTRITION DIAGNOSIS:   Increased nutrient needs related to cancer and cancer related treatments as evidenced by estimated needs.  GOAL:   Patient will meet greater than or equal to 90% of their needs  MONITOR:   PO intake, Supplement acceptance, Labs, Weight trends, I & O's, Skin  REASON FOR ASSESSMENT:   Malnutrition Screening Tool    ASSESSMENT:   74 year old with metastatic prostate cancer to bone on the palliative radiation, PVD, carotid stenosis, HLD admitted for bilateral lower extremity weakness and recommendations of radiation oncology.  **RD working remotely**  Patient undergoing palliative radiation for metastatic prostate cancer. Pt with stage 2 buttocks pressure injury as well. Per chart review, pt is consuming 0-50% of meals. Pt's appetite has been poor recently. Will order Ensure supplements. Per Palliative care note, recommends home with hospice.  Per weight records, pt has lost 30 lbs since 3/4 (17% wt loss x 2 months, significant for time frame).  I/Os: -450 ml since admit UOP: 100 ml x 24 hrs  Labs reviewed. Medications: Megace, Remeron, Vitamin D  NUTRITION - FOCUSED PHYSICAL EXAM:  Working remotely.  Diet Order:   Diet Order            Diet Heart Room service appropriate? Yes; Fluid consistency: Thin  Diet effective now              EDUCATION NEEDS:   No education needs have been identified at this time  Skin:  Skin Assessment: Reviewed RN Assessment  Last BM:  PTA  Height:   Ht Readings from Last 1 Encounters:  12/28/19 5\' 7"  (1.702 m)    Weight:   Wt Readings from Last 1 Encounters:  12/28/19 62.7 kg    Ideal Body Weight:  67.2 kg  BMI:  Body mass index is 21.65 kg/m.  Estimated Nutritional Needs:   Kcal:  1900-2100  Protein:  90-100g  Fluid:   2L/day  Clayton Bibles, MS, RD, LDN Inpatient Clinical Dietitian Contact information available via Amion

## 2019-12-30 NOTE — Consult Note (Signed)
Consultation Note Date: 12/30/2019   Patient Name: Alec Harris  DOB: November 07, 1945  MRN: [id number]  Age / Sex: 74 y.o., male  PCP: Patient, No Pcp Per Referring Physician: Jonnie Finner, DO  Reason for Consultation: Establishing goals of care  HPI/Patient Profile: 74 y.o. male  admitted on 12/27/2019  74 year old with metastatic prostate cancer to bone on the palliative radiation, PVD, carotid stenosis, HLD admitted for bilateral lower extremity weakness and recommendations of radiation oncology. Progressively had difficulty with moving his lower extremities over the past month. Denies any bowel bladder incontinence. MRI spine showed widespread metastatic disease throughout cervical, thoracic and lumbar spine with epidural tumor T7-9 causing cord deformity. Neurosurgery consulted. Started on Decadron.  Clinical Assessment and Goals of Care:  Patient remains admitted to the hospital medicine service for BLE weakness with metastatic bone disease and new epidural tumor around cord of T7-T9. Patient has been seen by his oncologist, it is noted that he is approaching end-stage prostate cancer and any treatment would offer some palliation of his pain and mobility,no additional anticancer therapy will be prescribed.  Transitioning to hospice would be his next step upon completing radiation. His prognosis is poor with limited life expectancy. Patient is on decadron, opioids and bowel regimen.   A palliative consult has been requested for additional support.   Mr Maroun is resting in bed, he complains of feeling cold, he is awaiting today's dose of radiation, his wife is at the bedside. I introduced myself and palliative care as follows: Palliative medicine is specialized medical care for people living with serious illness. It focuses on providing relief from the symptoms and stress of a serious illness. The goal is  to improve quality of life for both the patient and the family.  Goals of care: Broad aims of medical therapy in relation to the patient's values and preferences. Our aim is to provide medical care aimed at enabling patients to achieve the goals that matter most to them, given the circumstances of their particular medical situation and their constraints.   I discussed with the patient and his wife about current scope of care, current pain and non pain symptom management. We discussed about the palliative nature of current treatments. Discussed about his care going forward, discussed briefly about patient requiring hospice support after current hospital stay is completed. See below.   NEXT OF KIN  lives at home with wife and son.   SUMMARY OF RECOMMENDATIONS    Agree with DNR Continue current mode of care, completing palliative radiation. Discussed with patient and wife about "palliative" nature of current treatment.  Recommend home with hospice on discharge. Patient lives at home with wife and son.  Continue current pain management, augment bowel regimen as patient is complaining of constipation.  Thank you for the consult.   Code Status/Advance Care Planning:  DNR    Symptom Management:    as above  Palliative Prophylaxis:   Delirium Protocol   Psycho-social/Spiritual:   Desire for further Chaplaincy support:yes  Additional Recommendations: Education  on Hospice  Prognosis:   < 6 months  Discharge Planning: Home with Hospice      Primary Diagnoses: Present on Admission: . Lower extremity weakness . Prostate cancer ([place]) . PVD (peripheral vascular disease) with claudication ([place]) . Hyperlipemia   I have reviewed the medical record, interviewed the patient and family, and examined the patient. The following aspects are pertinent.  Past Medical History:  Diagnosis Date  . Carotid artery disease ([place])    By doppler  . Claudication ([place]) 02/06/2012   ABI of 0.5  on both legs ,Rgt ext. iliac70 to 99%,rgt SFA occlude,lft ext.iliac common fem 70 to 99% ,Lft SFA occluded,mono flow popliteal  . Hyperlipemia   . Hypertension   . PAD (peripheral artery disease) ([place]) 01/2012   severe diffuse his iliacs,SFA, carotids  . Prostate cancer ([place])   . Rectal bleeding 07/24/11   Diverticular Bleeding  . Tobacco abuse   . Weakness 12/2019   Social History   Socioeconomic History  . Marital status: Married    Spouse name: Not on file  . Number of children: Not on file  . Years of education: Not on file  . Highest education level: Not on file  Occupational History  . Not on file  Tobacco Use  . Smoking status: Former Smoker    Years: 50.00    Types: Cigarettes    Quit date: 11/02/2019    Years since quitting: 0.1  . Smokeless tobacco: Never Used  . Tobacco comment: pt states that he is going to quit completely; 1/2 pack per day  Substance and Sexual Activity  . Alcohol use: Not Currently  . Drug use: No  . Sexual activity: Not Currently    Birth control/protection: None  Other Topics Concern  . Not on file  Social History Narrative  . Not on file   Social Determinants of Health   Financial Resource Strain:   . Difficulty of Paying Living Expenses:   Food Insecurity:   . Worried About Charity fundraiser in the Last Year:   . Arboriculturist in the Last Year:   Transportation Needs:   . Film/video editor (Medical):   [name] Lack of Transportation (Non-Medical):   Physical Activity:   . Days of Exercise per Week:   . Minutes of Exercise per Session:   Stress:   . Feeling of Stress :   Social Connections:   . Frequency of Communication with Friends and Family:   . Frequency of Social Gatherings with Friends and Family:   . Attends Religious Services:   . Active Member of Clubs or Organizations:   . Attends Archivist Meetings:   [name] Marital Status:    Family History  Problem Relation Age of Onset  . Hypertension Mother   .  Heart attack Mother   . Heart disease Mother        CHF/  After age 2  . Heart attack Father   . Heart disease Father        Irregular Heart beat, Heart Disease before age 86  . Cancer Brother        throat  . Breast cancer Neg Hx   . Colon cancer Neg Hx   . Prostate cancer Neg Hx    Scheduled Meds: . amLODipine  5 mg Oral Daily  . atorvastatin  20 mg Oral Daily  . Chlorhexidine Gluconate Cloth  6 each Topical Daily  . dexamethasone (DECADRON) injection  4 mg  Intravenous Q6H  . megestrol  400 mg Oral Daily  . melatonin  5 mg Oral QHS  . mirtazapine  7.5 mg Oral QHS  . Vitamin D (Ergocalciferol)  50,000 Units Oral Q Wed   Continuous Infusions: PRN Meds:.acetaminophen, bisacodyl, HYDROcodone-acetaminophen, oxyCODONE, polyethylene glycol, senna-docusate Medications Prior to Admission:  Prior to Admission medications   Medication Sig Start Date End Date Taking? Authorizing Provider  amLODipine (NORVASC) 5 MG tablet TAKE 1 TABLET (5 MG TOTAL) BY MOUTH DAILY. 04/28/19  Yes Lorretta Harp, MD  aspirin EC 81 MG tablet Take 81 mg by mouth daily.   Yes [provider]  atorvastatin (LIPITOR) 20 MG tablet TAKE 1 TABLET BY MOUTH EVERY DAY Patient taking differently: Take 20 mg by mouth daily.  05/18/19  Yes Lorretta Harp, MD  cilostazol (PLETAL) 100 MG tablet TAKE 1 TABLET BY MOUTH TWICE A DAY Patient taking differently: Take 100 mg by mouth 2 (two) times daily.  05/09/19  Yes Lorretta Harp, MD  HYDROcodone-acetaminophen (NORCO/VICODIN) 5-325 MG tablet Take 1 tablet by mouth every 8 (eight) hours as needed for moderate pain. 10/31/19  Yes [name], MD  lidocaine-prilocaine (EMLA) cream Apply 1 application topically as needed. 05/21/17  Yes [name], MD  megestrol (MEGACE) 40 MG/ML suspension TAKE 10 MLS (400 MG TOTAL) BY MOUTH DAILY. Patient taking differently: Take 400 mg by mouth daily.  11/21/19  Yes [name], MD  mirtazapine (REMERON) 7.5 MG tablet Take  1 tablet (7.5 mg total) by mouth at bedtime. 12/14/19  Yes Tanner, Lyndon Code., PA-C  oxyCODONE (OXY IR/ROXICODONE) 5 MG immediate release tablet Take 1 tablet (5 mg total) by mouth every 4 (four) hours as needed for severe pain. Patient not taking: Reported on 12/27/2019 11/01/19   [name], MD  traMADol (ULTRAM) 50 MG tablet Take 1 tablet (50 mg total) by mouth every 6 (six) hours as needed. Patient not taking: Reported on 12/27/2019 06/01/19   [name], MD   No Known Allergies Review of Systems +weakness +constipation  Physical Exam Appears weak Resting in bed Regular work of breathing S1 S2 Flat affect somewhat No edema  Vital Signs: BP (!) 152/77 (BP Location: Left Arm)   Pulse (!) 115   Temp 98.7 F (37.1 C) (Axillary)   Resp 18   Ht 5\' 7"  (1.702 m)   Wt 62.7 kg   SpO2 97%   BMI 21.65 kg/m  Pain Scale: 0-10   Pain Score: 0-No pain   SpO2: SpO2: 97 % O2 Device:SpO2: 97 % O2 Flow Rate: .   IO: Intake/output summary:   Intake/Output Summary (Last 24 hours) at 12/30/2019 1238 Last data filed at 12/30/2019 0956 Gross per 24 hour  Intake 480 ml  Output 100 ml  Net 380 ml    LBM: Last BM Date: (PTA) Baseline Weight: Weight: 72.6 kg Most recent weight: Weight: 62.7 kg     Palliative Assessment/Data:   PPS 40%  Time In:  12 Time Out:  1300 Time Total:   60 min.  Greater than 50%  of this time was spent counseling and coordinating care related to the above assessment and plan.  Signed by: Loistine Chance, MD   Please contact Palliative Medicine Team phone at [phone] for questions and concerns.  For individual provider: See Shea Evans

## 2019-12-30 NOTE — Care Management Important Message (Signed)
Important Message  Patient Details IM Letter given to Marney Doctor RN Case Manager to present to the Patient Name: Nakoa Kithcart MRN: [id number] Date of Birth: 1946-02-15   Medicare Important Message Given:  Yes     Kerin Salen 12/30/2019, 9:48 AM

## 2019-12-31 LAB — CBC
HCT: 25.6 % — ABNORMAL LOW (ref 39.0–52.0)
Hemoglobin: 8.1 g/dL — ABNORMAL LOW (ref 13.0–17.0)
MCH: 34 pg (ref 26.0–34.0)
MCHC: 31.6 g/dL (ref 30.0–36.0)
MCV: 107.6 fL — ABNORMAL HIGH (ref 80.0–100.0)
Platelets: 24 10*3/uL — CL (ref 150–400)
RBC: 2.38 MIL/uL — ABNORMAL LOW (ref 4.22–5.81)
RDW: 21.2 % — ABNORMAL HIGH (ref 11.5–15.5)
WBC: 4.5 10*3/uL (ref 4.0–10.5)
nRBC: 15 % — ABNORMAL HIGH (ref 0.0–0.2)

## 2019-12-31 LAB — RENAL FUNCTION PANEL
Albumin: 2.9 g/dL — ABNORMAL LOW (ref 3.5–5.0)
Anion gap: 6 (ref 5–15)
BUN: 30 mg/dL — ABNORMAL HIGH (ref 8–23)
CO2: 22 mmol/L (ref 22–32)
Calcium: 8.3 mg/dL — ABNORMAL LOW (ref 8.9–10.3)
Chloride: 106 mmol/L (ref 98–111)
Creatinine, Ser: 0.77 mg/dL (ref 0.61–1.24)
GFR calc Af Amer: >60 mL/min (ref >60)
GFR calc non Af Amer: >60 mL/min (ref >60)
Glucose, Bld: 141 mg/dL — ABNORMAL HIGH (ref 70–99)
Phosphorus: 2.7 mg/dL (ref 2.5–4.6)
Potassium: 4.2 mmol/L (ref 3.5–5.1)
Sodium: 134 mmol/L — ABNORMAL LOW (ref 135–145)

## 2019-12-31 LAB — MAGNESIUM: Magnesium: 2.3 mg/dL (ref 1.7–2.4)

## 2019-12-31 MED ORDER — SODIUM CHLORIDE 0.9 % IV SOLN: Freq: Once | INTRAVENOUS | Status: AC

## 2019-12-31 MED ORDER — VITAMIN B-12 1000 MCG PO TABS
500.0000 ug | ORAL_TABLET | Freq: Every day | ORAL | Status: DC
  Administered 2019-12-31 – 2020-01-03 (×4): 500 ug via ORAL
  Filled 2019-12-31 (×4): qty 1

## 2019-12-31 NOTE — Progress Notes (Addendum)
[name]  PROGRESS NOTE    Alec Harris  [id number] DOB: 1946-01-10 DOA: 12/27/2019 PCP: Patient, No Pcp Per   Brief Narrative:   74 year old with metastatic prostate cancer to bone on the palliative radiation, PVD, carotid stenosis, HLD admitted for bilateral lower extremity weakness and recommendations of radiation oncology. Progressively had difficulty with moving his lower extremities over the past month. Denies any bowel bladder incontinence. MRI spine showed widespread metastatic disease throughout cervical, thoracic and lumbar spine with epidural tumor T7-9 causing cord deformity. Neurosurgery consulted. Started on Decadron.  5/13: Pt presenting for palliative radiation. Spoke with wife/pt about palliative care/hospice, but they seem confused on the subject. The patient asked what was the purpose of this radiation as if he was not under the understanding that this is palliative therapy. Palliative care has been consulted.  5/14: Continuing palliative XRT. Add BM regimen. Home with hospice after.   5/15: Home hospice referral sent. PT/OT consulted. Continuing palliative XRT. Home next 24- 48hrs?  Assessment & Plan:   Principal Problem:   Lower extremity weakness Active Problems:   Prostate cancer ([facility])   PVD (peripheral vascular disease) with claudication ([facility])   Hyperlipemia   Essential hypertension  BLE weakness with metastatic bone disease and new epidural tumor around cord of T7-T9 Metastatic prostatic adenocarcinoma; s/p prostatectomy - Case reviewed by neurosurgery, radiation oncology and oncology. - Pt has been [facility] to [facility] for [facility] radiation - 5/13: have reviewed chart; per onco " I reiterated to these findings today which include a rapid progression of his disease at this time. He is approaching end-stage prostate cancer and any treatment would offer some palliation of his pain and mobility it would be reasonable to do.He understands that no  additional anticancer therapy will be prescribed. Transitioning to hospice would be his next step upon completing radiation. His prognosis is poor with limited life expectancy" - per neurosurg: "Given the end-stage nature of his disease and significant comorbidities/medical condition as well as the fact that recovery from surgery would not be trivial, I think that primary palliative radiation is the best option in this case rather than surgical decompression followed by radiation. I have reviewed the situation with the patient and his wife. We discussed the options of surgery + radiation vs radiation alone. After d/w rad-onc, they are agreeable to primary palliative radiation. All their questions were answered." - Palliative care team consulted to help establish goals of care. - continue decadron 4 mg q6h     - 5/14: continue palliative XRT. Home with hospice after.      - 5/15: PT/OT consulted. Hospice referral made. Continue XRT.  Pancytopenia with severe thrombocytopenia - Bone marrow suppression from metastatic disease and radiation - Dr. [name] Blew onboard, will differ need to plt transfusion to him - ASA was held and Cilostazol continued - 5/13: labs are essentially stable from yesterday; continue to follow - hold pletal  Elevated ALP - Secondary to metastatic bone disease; see above  Hypertension - Continue amlodipine - 5/13: BP is ok this AM, continue as above     - 5/14: stable, continue as above.      - 5/15: he is stable. Continue to monitor.  Hyperlipidemia - Continue statin  PVD - ASA was held and Cilostazol continued - 5/13: hold pletal for thrombocytopenia     - 5/15: these numbers are stable; monitor  Constipation     - colace, miralax  Vitamin D deficiency      - continue vit D  DVT prophylaxis: SCDs Code Status: DNR   Status is: Inpatient  Remains inpatient appropriate because:Palliative XRT pain  management.    Dispo: The patient is from: Home              Anticipated d/c is to: Home              Anticipated d/c date is: 1 day              Patient currently is not medically stable to d/c.  Consultants:   Heme/Onc  Radiation Oncology  Palliative Care  Procedures:   XRT  ROS:  Denies CP, palpitations, N, V . Remainder 10-pt ROS is negative for all not previously mentioned.  Subjective: "I'm ok."  Objective: Vitals:   12/30/19 1524 12/30/19 2116 12/31/19 0704 12/31/19 1425  BP: 134/79 125/72 129/80 137/79  Pulse: (!) 114 (!) 113 (!) 101 (!) 104  Resp: 17 16 16 17   Temp: 98.1 F (36.7 C) 98.3 F (36.8 C) (!) 97.5 F (36.4 C) 98.1 F (36.7 C)  TempSrc: Oral Oral Oral Oral  SpO2: 96% 100% 99% 97%  Weight:      Height:        Intake/Output Summary (Last 24 hours) at 12/31/2019 1432 Last data filed at 12/31/2019 0936 Gross per 24 hour  Intake --  Output 200 ml  Net -200 ml   Filed Weights   12/27/19 1248 12/28/19 0103 12/28/19 1732  Weight: 72.6 kg 62.1 kg 62.7 kg    Examination:  General: 74 y.o. male resting in bed in NAD Cardiovascular: RRR, +S1, S2, no m/g/r, equal pulses throughout Respiratory: CTABL, no w/r/r, normal WOB GI: BS+, NDNT, no masses noted, no organomegaly noted MSK: No e/c/c Neuro: Alert to name, follows commands Psyc: Appropriate interaction and affect, calm/cooperative   Data Reviewed: I have personally reviewed following labs and imaging studies.  CBC: Recent Labs  Lab 12/27/19 1326 12/28/19 0811 12/29/19 0603 12/30/19 0500 12/31/19 0629  WBC 3.7*  --  3.5* 4.3 4.5  NEUTROABS 2.8  --   --   --   --   HGB 8.0*  --  7.9* 8.0* 8.1*  HCT 24.7* 24.5* 24.7* 25.3* 25.6*  MCV 103.8*  --  106.0* 106.3* 107.6*  PLT 28*  --  26* 28* 24*   Basic Metabolic Panel: Recent Labs  Lab 12/27/19 1326 12/29/19 0603 12/30/19 0500 12/31/19 0629  NA 134* 137 137 134*  K 3.9 4.2 4.3 4.2  CL 102 105 106 106  CO2 21* 21* 21* 22   GLUCOSE 111* 132* 125* 141*  BUN 13 22 29* 30*  CREATININE 0.64 0.59* 0.87 0.77  CALCIUM 8.0* 8.3* 8.0* 8.3*  MG  --  2.2 2.3 2.3  PHOS  --   --  2.9 2.7   GFR: Estimated Creatinine Clearance: 69.7 mL/min (by C-G formula based on SCr of 0.77 mg/dL). Liver Function Tests: Recent Labs  Lab 12/27/19 1326 12/30/19 0500 12/31/19 0629  AST 17  --   --   ALT 14  --   --   ALKPHOS 605*  --   --   BILITOT 1.7*  --   --   PROT 5.5*  --   --   ALBUMIN 2.7* 3.0* 2.9*   No results for input(s): LIPASE, AMYLASE in the last 168 hours. No results for input(s): AMMONIA in the last 168 hours. Coagulation Profile: No results for input(s): INR, PROTIME in the last 168 hours. Cardiac Enzymes: No results  for input(s): CKTOTAL, CKMB, CKMBINDEX, TROPONINI in the last 168 hours. BNP (last 3 results) No results for input(s): PROBNP in the last 8760 hours. HbA1C: No results for input(s): HGBA1C in the last 72 hours. CBG: No results for input(s): GLUCAP in the last 168 hours. Lipid Profile: No results for input(s): CHOL, HDL, LDLCALC, TRIG, CHOLHDL, LDLDIRECT in the last 72 hours. Thyroid Function Tests: No results for input(s): TSH, T4TOTAL, FREET4, T3FREE, THYROIDAB in the last 72 hours. Anemia Panel: No results for input(s): VITAMINB12, FOLATE, FERRITIN, TIBC, IRON, RETICCTPCT in the last 72 hours. Sepsis Labs: No results for input(s): PROCALCITON, LATICACIDVEN in the last 168 hours.  Recent Results (from the past 240 hour(s))  SARS Coronavirus 2 by RT PCR (hospital order, performed in [facility] hospital lab) Nasopharyngeal Nasopharyngeal Swab     Status: None   Collection Time: 12/27/19  7:36 PM   Specimen: Nasopharyngeal Swab  Result Value Ref Range Status   SARS Coronavirus 2 NEGATIVE NEGATIVE Final    Comment: (NOTE) SARS-CoV-2 target nucleic acids are NOT DETECTED. The SARS-CoV-2 RNA is generally detectable in upper and lower respiratory specimens during the acute phase of  infection. The lowest concentration of SARS-CoV-2 viral copies this assay can detect is 250 copies / mL. A negative result does not preclude SARS-CoV-2 infection and should not be used as the sole basis for treatment or other patient management decisions.  A negative result may occur with improper specimen collection / handling, submission of specimen other than nasopharyngeal swab, presence of viral mutation(s) within the areas targeted by this assay, and inadequate number of viral copies (<250 copies / mL). A negative result must be combined with clinical observations, patient history, and epidemiological information. Fact Sheet for Patients:   StrictlyIdeas.no Fact Sheet for Healthcare Providers: BankingDealers.co.za This test is not yet approved or cleared  by the [place] FDA and has been authorized for detection and/or diagnosis of SARS-CoV-2 by FDA under an Emergency Use Authorization (EUA).  This EUA will remain in effect (meaning this test can be used) for the duration of the COVID-19 declaration under Section 564(b)(1) of the Act, 21 U.S.C. section 360bbb-3(b)(1), unless the authorization is terminated or revoked sooner. Performed at [facility], [place] [address] [place], [place] [address]   MRSA PCR Screening     Status: None   Collection Time: 12/28/19  6:04 AM   Specimen: Nasal Mucosa; Nasopharyngeal  Result Value Ref Range Status   MRSA by PCR NEGATIVE NEGATIVE Final    Comment:        The GeneXpert MRSA Assay (FDA approved for NASAL specimens only), is one component of a comprehensive MRSA colonization surveillance program. It is not intended to diagnose MRSA infection nor to guide or monitor treatment for MRSA infections. Performed at [facility], [place] [address], [place], [place] [address]       Radiology Studies: No results found.   Scheduled Meds: . amLODipine  5 mg Oral  Daily  . atorvastatin  20 mg Oral Daily  . Chlorhexidine Gluconate Cloth  6 each Topical Daily  . dexamethasone (DECADRON) injection  4 mg Intravenous Q6H  . docusate sodium  100 mg Oral BID  . feeding supplement (ENSURE ENLIVE)  237 mL Oral BID BM  . megestrol  400 mg Oral Daily  . melatonin  5 mg Oral QHS  . mirtazapine  7.5 mg Oral QHS  . multivitamin with minerals  1 tablet Oral Daily  . vitamin  B-12  500 mcg Oral Daily  . Vitamin D (Ergocalciferol)  50,000 Units Oral Q Wed   Continuous Infusions:   LOS: 4 days    Time spent: 25 minutes spent in the coordination of care today.    Jonnie Finner, DO [facility]  If 7PM-7AM, please contact night-coverage [email or web address] 12/31/2019, 2:32 PM

## 2019-12-31 NOTE — Progress Notes (Signed)
[facility])  Referral received for hospice services at home.  Met with pt and his wife at the bedside to answer questions and provide support.  Mr. Blount is very determined to leave the hospital today and return for his palliative radiation on Monday and again on Tuesday.    Advised him that he needs to stay in the hospital per the recommendations of his treatment team, and once he is ready to discharge we can begin hospice services at home to help support him.  Wife seems concerned at her ability to care for him as she does not think he has been able to get OOB since his admission on Tuesday.  Discussed safety concerns with pt and that his mobility has likely changed since admission and the challenges that would occur with transporting him back and forth in his car to finish treatments as an outpatient.  Pt is adamant that he wants to leave.  Discussed with wife that plan will be to discharge home with hospice on Tuesday after treatment is completed--if pt will consent to staying.   Update TOC manager.  [facility] will continue to follow and plan to get services started once he discharges form the hospital.  Venia Carbon RN, BSN, [facility] Liaison ([place] chat or in [place])

## 2019-12-31 NOTE — TOC Initial Note (Signed)
Transition of Care [facility]) - Initial/Assessment Note    Patient Details  Name: Alec Harris MRN: [id number] Date of Birth: [birth year]  Transition of Care [facility] CM/SW Contact:    Joaquin Courts, RN Phone Number: 12/31/2019, 12:57 PM  Clinical Narrative:   CM offered hospice choice to spouse, who selects [facility] for hospice at home services.  [facility] rep given referral.                  Expected Discharge Plan: Home w Hospice Care Barriers to Discharge: Continued Medical Work up   Patient Goals and CMS Choice Patient states their goals for this hospitalization and ongoing recovery are:: to go home with hospice services CMS Medicare.gov Compare Post Acute Care list provided to:: Patient Represenative (must comment) Choice offered to / list presented to : Spouse  Expected Discharge Plan and Services Expected Discharge Plan: [place]   Discharge Planning Services: CM Consult Post Acute Care Choice: Hospice Living arrangements for the past 2 months: Single Family Home                                      Prior Living Arrangements/Services Living arrangements for the past 2 months: Single Family Home Lives with:: Spouse Patient language and need for interpreter reviewed:: Yes Do you feel safe going back to the place where you live?: Yes      Need for Family Participation in Patient Care: Yes (Comment) Care giver support system in place?: Yes (comment)   Criminal Activity/Legal Involvement Pertinent to Current Situation/Hospitalization: No - Comment as needed  Activities of Daily Living Home Assistive Devices/Equipment: Cane (specify quad or straight), Walker (specify type) ADL Screening (condition at time of admission) Patient's cognitive ability adequate to safely complete daily activities?: Yes Is the patient deaf or have difficulty hearing?: No Does the patient have difficulty seeing, even when wearing glasses/contacts?: No Does the patient  have difficulty concentrating, remembering, or making decisions?: Yes Patient able to express need for assistance with ADLs?: Yes Does the patient have difficulty dressing or bathing?: No Independently performs ADLs?: Yes (appropriate for developmental age) Does the patient have difficulty walking or climbing stairs?: Yes Weakness of Legs: Both Weakness of Arms/Hands: None  Permission Sought/Granted                  Emotional Assessment           Psych Involvement: No (comment)  Admission diagnosis:  Lower extremity weakness [R29.898] Metastasis of neoplasm to spinal canal ([place]) [C79.49] Weakness of both lower extremities [R29.898] Prostate cancer metastatic to bone ([place]) [C61, C79.51] Patient Active Problem List   Diagnosis Date Noted  . Pressure injury of skin 12/28/2019  . Lower extremity weakness 12/27/2019  . Essential hypertension 12/27/2019  . Malignant neoplasm of prostate metastatic to bone ([place]) 06/10/2019  . Encounter for antineoplastic chemotherapy 09/16/2017  . Port-A-Cath in place 07/15/2017  . Goals of care, counseling/discussion 05/21/2017  . Aftercare following surgery of the circulatory system, [place] 08/15/2013  . PVD (peripheral vascular disease) with claudication ([place]) 12/29/2012  . Carotid disease, bilateral ([place]) 12/29/2012  . Hyperlipemia 12/29/2012  . Prostate cancer ([place]) 07/07/2012  . Occlusion and stenosis of carotid artery without mention of cerebral infarction 03/29/2012   PCP:  Patient, No Pcp Per Pharmacy:   [id number] - [place], [address]  [id number] Phone: [phone] Fax: [phone]     Social Determinants of Health (SDOH) Interventions    Readmission Risk Interventions No flowsheet data found.

## 2020-01-01 DIAGNOSIS — C7951 Secondary malignant neoplasm of bone: Secondary | ICD-10-CM | POA: Insufficient documentation

## 2020-01-01 LAB — CBC
HCT: 25.6 % — ABNORMAL LOW (ref 39.0–52.0)
Hemoglobin: 8.1 g/dL — ABNORMAL LOW (ref 13.0–17.0)
MCH: 34.2 pg — ABNORMAL HIGH (ref 26.0–34.0)
MCHC: 31.6 g/dL (ref 30.0–36.0)
MCV: 108 fL — ABNORMAL HIGH (ref 80.0–100.0)
Platelets: 21 10*3/uL — CL (ref 150–400)
RBC: 2.37 MIL/uL — ABNORMAL LOW (ref 4.22–5.81)
RDW: 21.2 % — ABNORMAL HIGH (ref 11.5–15.5)
WBC: 5 10*3/uL (ref 4.0–10.5)
nRBC: 13.7 % — ABNORMAL HIGH (ref 0.0–0.2)

## 2020-01-01 LAB — RENAL FUNCTION PANEL
Albumin: 3 g/dL — ABNORMAL LOW (ref 3.5–5.0)
Anion gap: 7 (ref 5–15)
BUN: 29 mg/dL — ABNORMAL HIGH (ref 8–23)
CO2: 23 mmol/L (ref 22–32)
Calcium: 8.4 mg/dL — ABNORMAL LOW (ref 8.9–10.3)
Chloride: 111 mmol/L (ref 98–111)
Creatinine, Ser: 0.59 mg/dL — ABNORMAL LOW (ref 0.61–1.24)
GFR calc Af Amer: >60 mL/min (ref >60)
GFR calc non Af Amer: >60 mL/min (ref >60)
Glucose, Bld: 120 mg/dL — ABNORMAL HIGH (ref 70–99)
Phosphorus: 2.8 mg/dL (ref 2.5–4.6)
Potassium: 4.3 mmol/L (ref 3.5–5.1)
Sodium: 141 mmol/L (ref 135–145)

## 2020-01-01 LAB — MAGNESIUM: Magnesium: 2.4 mg/dL (ref 1.7–2.4)

## 2020-01-01 MED ORDER — DEXTROSE-NACL 5-0.9 % IV SOLN: Freq: Once | INTRAVENOUS | Status: AC

## 2020-01-01 MED ORDER — GERHARDT'S BUTT CREAM
TOPICAL_CREAM | Freq: Three times a day (TID) | CUTANEOUS | Status: DC
  Filled 2020-01-01: qty 1

## 2020-01-01 NOTE — Consult Note (Signed)
[facility] Nurse Consult Note: Reason for Consult: Consult for sacral area.  Patient's wife is present for my assessment and tells me that the area in question never "broke open", but that it has no pigment.  She puts antibiotic cream on it (Neosporin). Wound type: Skin change in conjunction with incontinence (moisture), pressure and friction Pressure Injury POA: N/A Measurement:pearl-like lesion measuring 1.5cm x 1cm with no depth or elevation. Absent of melanin. Wound bed: as described above Drainage (amount, consistency, odor) None Periwound:intact, dry Dressing procedure/placement/frequency: I p[rovide education about using an antimicrobial ointment and instead recommend a skin barrier such as gerhart's Butt Cream while in house or Desitin at home. Wife is in agreement with this POC and expresses her appreciation for the visit. Turning and repositioning is in place, heels are floated.  [place] nursing team will not follow, but will remain available to this patient, the nursing and medical teams.  Please re-consult if needed. Thanks, Maudie Flakes, MSN, RN, [place], Arther Abbott  Pager# [phone]

## 2020-01-01 NOTE — Evaluation (Signed)
Occupational Therapy Evaluation Patient Details Name: Alec Harris MRN: [id number] DOB: 1945-11-02 Today's Date: 01/01/2020    History of Present Illness 74 year old with metastatic prostate cancer to bone on the palliative radiation, PVD, carotid stenosis, HLD admitted for bilateral lower extremity weakness and recommendations of radiation oncology.  MRI spine showed widespread metastatic disease throughout cervical, thoracic and lumbar spine with epidural tumor T 7 - 9 causing cord deformity.   Clinical Impression   Patient with significant weakness impacting ability to participate in self care, functional transfers. Pt max A for bed mobility, and max A x1 yet unable to come to complete stand with rolling walker with bed height elevated. Per RN patient was requiring assist from spouse and son prior to hospitalization. Unfortunately spouse not present at time of evaluation.   Spoke with RN that patient was able to scoot himself up to head of bed with min guard assistance. Having a drop arm commode so patient is able to lateral scoot from bed to bedside commode could be helpful for patient and family caring for him. Will continue to follow for transfer training for pt/family and education for HEP to help maintain general strength to reduce caregiver burden during self care and functional transfers. Patient expresses his goal is to be able to get to bedside commode at home.    Follow Up Recommendations  No OT follow up;Supervision/Assistance - 24 hour;Other (comment)(plan is for home hospice)    Equipment Recommendations  Other (comment);Hospital bed(could benefit from drop arm commode if family does not have)       Precautions / Restrictions Precautions Precautions: Fall Restrictions Weight Bearing Restrictions: No      Mobility Bed Mobility Overal bed mobility: Needs Assistance Bed Mobility: Sit to Supine;Sidelying to Sit;Rolling Rolling: Max assist Sidelying to sit: Max assist    Sit to supine: Max assist   General bed mobility comments: max cues for sequencing, mod A with use of bed pad to roll onto side then to elevate trunk. minimal initiation from LEs either off on onto bed.   Transfers Overall transfer level: Needs assistance               General transfer comment: unable to complete sit to stand, able to clear buttock but could not extend hips and LEs to come to full stand    Balance Overall balance assessment: Needs assistance Sitting-balance support: Single extremity supported;Feet supported Sitting balance-Leahy Scale: Fair         Standing balance comment: unable                           ADL either performed or assessed with clinical judgement   ADL Overall ADL's : Needs assistance/impaired Eating/Feeding: Set up   Grooming: Set up;Wash/dry face;Wash/dry hands;Sitting   Upper Body Bathing: Moderate assistance;Sitting   Lower Body Bathing: Total assistance;Sitting/lateral leans;Bed level;Maximal assistance   Upper Body Dressing : Moderate assistance;Sitting   Lower Body Dressing: Maximal assistance;Total assistance;Bed level;Sitting/lateral leans   Toilet Transfer: +2 for physical assistance;+2 for safety/equipment Toilet Transfer Details (indicate cue type and reason): unable to come to standing with assist x1, anticipate extensive assist x2  Toileting- Clothing Manipulation and Hygiene: Total assistance       Functional mobility during ADLs: Moderate assistance General ADL Comments: patient requires extensive assistance for self care due to decrerased strength, safety, balance, cognition. per patient and RN report from spouse sounds as patient was requiring extensive assistance prior to this  hospitalization                  Pertinent Vitals/Pain Pain Assessment: Faces Faces Pain Scale: Hurts little more Pain Location: global Pain Descriptors / Indicators: Grimacing Pain Intervention(s): Monitored during  session     Hand Dominance Right   Extremity/Trunk Assessment Upper Extremity Assessment Upper Extremity Assessment: Generalized weakness   Lower Extremity Assessment Lower Extremity Assessment: Defer to PT evaluation       Communication Communication Communication: No difficulties   Cognition Arousal/Alertness: Awake/alert Behavior During Therapy: Flat affect Overall Cognitive Status: No family/caregiver present to determine baseline cognitive functioning                                 General Comments: patient disoriented to time, when asked the month pt states "its the 21st of the first month" when given cue re: recent holiday pt able to answer May. Patient states at [place] expects to be discharged to:: Private residence Living Arrangements: Spouse/significant other;Children;Other (Comment)(son) Available Help at Discharge: Family;Available 24 hours/day Type of Home: House Home Access: Stairs to enter [employer] of Steps: 3 Entrance Stairs-Rails: None Home Layout: One level     Bathroom Shower/Tub: Teacher, early years/pre: Standard     Home Equipment: Environmental consultant - 2 wheels;Cane - single point;Bedside commode   Additional Comments: info obtained from patient, may need clarification from spouse      Prior Functioning/Environment Level of Independence: Needs assistance  Gait / Transfers Assistance Needed: per RN, spouse and son had to assist pt with standing with a walker to get to bathroom. right before hospitalization was unable to get to bathroom.  ADL's / Homemaking Assistance Needed: spouse assists with ADLs per patient            OT Problem List: Decreased strength;Decreased activity tolerance;Impaired balance (sitting and/or standing)      OT Treatment/Interventions: Therapeutic exercise;Self-care/ADL training;Patient/family education;DME and/or AE instruction    OT  Goals(Current goals can be found in the care plan section) Acute Rehab OT Goals Patient Stated Goal: to get to bedside commode OT Goal Formulation: With patient Time For Goal Achievement: 01/15/20 Potential to Achieve Goals: Good  OT Frequency: Min 2X/week    AM-PAC OT "6 Clicks" Daily Activity     Outcome Measure Help from another person eating meals?: A Little Help from another person taking care of personal grooming?: A Little Help from another person toileting, which includes using toliet, bedpan, or urinal?: Total Help from another person bathing (including washing, rinsing, drying)?: A Lot Help from another person to put on and taking off regular upper body clothing?: A Lot Help from another person to put on and taking off regular lower body clothing?: Total 6 Click Score: 12   End of Session Equipment Utilized During Treatment: Rolling walker Nurse Communication: Mobility status  Activity Tolerance: Patient limited by fatigue Patient left: in bed;with call bell/phone within reach;with bed alarm set  OT Visit Diagnosis: Muscle weakness (generalized) (M62.81)                Time: [id number] OT Time Calculation (min): 23 min Charges:  OT General Charges $OT Visit: 1 Visit OT Evaluation $OT Eval Moderate Complexity: 1 Mod OT Treatments $Self Care/Home Management : 8-22 mins  Delbert Phenix OT Pager: [phone]  Raquel Sarna  Ronie Spies 01/01/2020, 9:44 AM

## 2020-01-01 NOTE — Evaluation (Signed)
Physical Therapy Evaluation Patient Details Name: Alec Harris MRN: [id number] DOB: 1945/12/20 Today's Date: 01/01/2020   History of Present Illness  74 year old with metastatic prostate cancer to bone on the palliative radiation, PVD, carotid stenosis, HLD admitted for bilateral lower extremity weakness and recommendations of radiation oncology.  MRI spine showed widespread metastatic disease throughout cervical, thoracic and lumbar spine with epidural tumor T 7 - 9 causing cord deformity.  Clinical Impression  Pt presents with dependencies in mobility affecting his independence secondary to the above diagnosis. Pt and wife reported they would like to d/c home with family providing assistance and HHPT services. Therapist initiated education on HEP with pt and wife. Pt was able to stand with RW +2 mod assist. Pt states he would like to continue to work with PT with a goal of increasing activity tolerance and mobility. Pt would benefit from continued acute PT to maximize mobility and Independence for return home with family and hospice care. Pt would benefit from a hospital bed to decrease burden of care and allow patient to assist more with mobility.     Follow Up Recommendations Supervision for mobility/OOB;Home health [facility] bed    Recommendations for Other Services       Precautions / Restrictions Precautions Precautions: Fall Restrictions Weight Bearing Restrictions: No      Mobility  Bed Mobility Overal bed mobility: Needs Assistance Bed Mobility: Rolling;Sidelying to Sit;Sit to Sidelying Rolling: Mod assist Sidelying to sit: Mod assist;HOB elevated   Sit to supine: Max assist Sit to sidelying: Mod assist;HOB elevated General bed mobility comments: max cues for technique, use of bed pad to initiate and assist, pt able to assist when given cues and the time to initiate.  Transfers Overall transfer level: Needs assistance Equipment used:  Rolling walker (2 wheeled) Transfers: Sit to/from Stand Sit to Stand: +2 physical assistance;Mod assist;From elevated surface         General transfer comment: stood with RW mod assist for balance. able to achieve full upright posture. Pt side stepped up to the [facility] with therapist and wife with +2 min assist. Pt declined to sit into the chair.  Ambulation/Gait                Stairs            Wheelchair Mobility    Modified Rankin (Stroke Patients Only)       Balance Overall balance assessment: Needs assistance Sitting-balance support: No upper extremity supported;Feet supported Sitting balance-Leahy Scale: Fair     Standing balance support: Bilateral upper extremity supported Standing balance-Leahy Scale: Poor Standing balance comment: unable                             Pertinent Vitals/Pain Pain Assessment: No/denies pain Faces Pain Scale: Hurts little more Pain Location: global Pain Descriptors / Indicators: Grimacing Pain Intervention(s): Monitored during session    Home Living Family/patient expects to be discharged to:: Private residence Living Arrangements: Spouse/significant other;Children;Other (Comment) Available Help at Discharge: Family;Available 24 hours/day Type of Home: House Home Access: Stairs to enter Entrance Stairs-Rails: None Entrance Stairs-Number of Steps: 3 Home Layout: One level Home Equipment: Walker - 2 wheels;Cane - single point;Bedside commode Additional Comments: pt may need a hospital bed    Prior Function Level of Independence: Needs assistance   Gait / Transfers Assistance Needed: pt's wife reported they assist with bed mobility, pt ambulates in the  house with RW with assistance short distances  ADL's / Homemaking Assistance Needed: spouse assists with ADLs per patient        Hand Dominance   Dominant Hand: Right    Extremity/Trunk Assessment   Upper Extremity Assessment Upper Extremity  Assessment: Defer to OT evaluation    Lower Extremity Assessment Lower Extremity Assessment: Generalized weakness       Communication   Communication: No difficulties  Cognition Arousal/Alertness: Awake/alert Behavior During Therapy: Flat affect Overall Cognitive Status: Within Functional Limits for tasks assessed                                 General Comments: slow to respond, was able to follow one step commands consistently      General Comments General comments (skin integrity, edema, etc.): Pt decined sitting in chair. Pt stated he only wants the therapy department to assist him OOB. Pt and wife given HEP and the wife was able to demonstrate good understanding but will need to review. Pt's wife reports she and her son assist him at home and they feel they can provide the assistance. Pt would like to receive HHPT services if they are available.    Exercises General Exercises - Lower Extremity Ankle Circles/Pumps: AROM;Strengthening;Both;10 reps;Supine Quad Sets: AROM;Strengthening;Both;10 reps;Supine Short Arc Quad: AROM;Strengthening;Both;Supine;10 reps Heel Slides: AAROM;Strengthening;Both;10 reps;Supine Hip ABduction/ADduction: AAROM;Strengthening;Both;10 reps;Supine Straight Leg Raises: AAROM;Strengthening;Both;10 reps;Supine   Assessment/Plan    PT Assessment Patient needs continued PT services  PT Problem List Decreased strength;Decreased balance;Decreased knowledge of precautions;Decreased range of motion;Decreased mobility;Decreased activity tolerance;Decreased safety awareness       PT Treatment Interventions DME instruction;Functional mobility training;Balance training;Patient/family education;Gait training;Therapeutic activities;Neuromuscular re-education;Stair training;Therapeutic exercise    PT Goals (Current goals can be found in the Care Plan section)  Acute Rehab PT Goals Patient Stated Goal: To return home PT Goal Formulation: With  patient Time For Goal Achievement: 01/15/20 Potential to Achieve Goals: Good    Frequency Min 3X/week   Barriers to discharge        Co-evaluation               AM-PAC PT "6 Clicks" Mobility  Outcome Measure Help needed turning from your back to your side while in a flat bed without using bedrails?: A Lot Help needed moving from lying on your back to sitting on the side of a flat bed without using bedrails?: A Lot Help needed moving to and from a bed to a chair (including a wheelchair)?: A Lot Help needed standing up from a chair using your arms (e.g., wheelchair or bedside chair)?: A Lot Help needed to walk in hospital room?: A Lot Help needed climbing 3-5 steps with a railing? : Total 6 Click Score: 11    End of Session Equipment Utilized During Treatment: Gait belt Activity Tolerance: Patient limited by fatigue Patient left: in bed;with call bell/phone within reach;with family/visitor present;with bed alarm set Nurse Communication: Mobility status PT Visit Diagnosis: Difficulty in walking, not elsewhere classified (R26.2);Muscle weakness (generalized) (M62.81)    Time: [id number] PT Time Calculation (min) (ACUTE ONLY): 42 min   Charges:   PT Evaluation $PT Eval Moderate Complexity: 1 Mod PT Treatments $Therapeutic Exercise: 8-22 mins $Therapeutic Activity: 8-22 mins        Lelon Mast 01/01/2020, 11:41 AM

## 2020-01-01 NOTE — Progress Notes (Signed)
Alec Harris  PROGRESS NOTE    Alec Harris  [id number] DOB: [birth year] DOA: 12/27/2019 PCP: Patient, No Pcp Per   Brief Narrative:   74 year old with metastatic prostate cancer to bone on the palliative radiation, PVD, carotid stenosis, HLD admitted for bilateral lower extremity weakness and recommendations of radiation oncology. Progressively had difficulty with moving his lower extremities over the past month. Denies any bowel bladder incontinence. MRI spine showed widespread metastatic disease throughout cervical, thoracic and lumbar spine with epidural tumor T7-9 causing cord deformity. Neurosurgery consulted. Started on Decadron.  5/13: Pt presenting for palliative radiation. Spoke with wife/pt about palliative care/hospice, but they seem confused on the subject. The patient asked what was the purpose of this radiation as if he was not under the understanding that this is palliative therapy. Palliative care has been consulted.  5/14: Continuing palliative XRT. Add BM regimen. Home with hospice after.  5/15: Home hospice referral sent. PT/OT consulted. Continuing palliative XRT. Home next 24- 48hrs?  5/16: XRT tomorrow and Tuesday, then home with hospice.  Assessment & Plan:   Principal Problem:   Lower extremity weakness Active Problems:   Prostate cancer (HCC)   PVD (peripheral vascular disease) with claudication (HCC)   Hyperlipemia   Essential hypertension   Pressure injury of skin  BLE weakness with metastatic bone disease and new epidural tumor around cord of T7-T9 Metastatic prostatic adenocarcinoma; s/p prostatectomy - Case reviewed by neurosurgery, radiation oncology and oncology. - Pt has been transferred to [facility] for palliative radiation - 5/13: have reviewed chart; per onco " I reiterated to these findings today which include a rapid progression of his disease at this time. He is approaching end-stage prostate cancer and any treatment would offer some  palliation of his pain and mobility it would be reasonable to do.He understands that no additional anticancer therapy will be prescribed. Transitioning to hospice would be his next step upon completing radiation. His prognosis is poor with limited life expectancy" - per neurosurg: "Given the end-stage nature of his disease and significant comorbidities/medical condition as well as the fact that recovery from surgery would not be trivial, I think that primary palliative radiation is the best option in this case rather than surgical decompression followed by radiation. I have reviewed the situation with the patient and his wife. We discussed the options of surgery + radiation vs radiation alone. After d/w rad-onc, they are agreeable to primary palliative radiation. All their questions were answered." - Palliative care team consulted to help establish goals of care. - continue decadron 4 mg q6h - 5/14: continue palliative XRT. Home with hospice after.     - 5/15: PT/OT consulted. Hospice referral made. Continue XRT.     - 5/16: PT/OT have seen. See their notes. Appreciate assistance. 2 more doses of XRT (tomorrow and Tuesday) then home with hospice.   Pancytopenia with severe thrombocytopenia - Bone marrow suppression from metastatic disease and radiation - Dr. [name] Blew onboard, will differ need to plt transfusion to him - ASA was held and Cilostazol continued - 5/13: labs are essentially stable from yesterday; continue to follow - hold pletal     - 5/16: slow downtrend. No evidence of bleed. Monitor.  Elevated ALP - Secondary to metastatic bone disease; see above  Hypertension - Continue amlodipine - 5/13: BP is ok this AM, continue as above - 5/14: stable, continue as above.     - 5/15: he is stable. Continue to monitor.  Hyperlipidemia - Continue statin  PVD - ASA  was held and Cilostazol continued - 5/13: hold pletal  for thrombocytopenia     - 5/15: these numbers are stable; monitor  Constipation - colace, miralax  Vitamin D deficiency      - continue vit D  Sacral ulcer      - S2; chronicity unknown      - WOCN  DVT prophylaxis: SCDs Code Status: DNR   Status is: Inpatient  Remains inpatient appropriate because:Ongoing active pain requiring inpatient pain management   Dispo: The patient is from: Home              Anticipated d/c is to: Home              Anticipated d/c date is: 2 days              Patient currently is not medically stable to d/c.  Consultants:   Heme/Onc  Radiation Oncology  Palliative Care  Procedures:   XRT  ROS:  Denies CP, N, V, dyspnea . Remainder 10-pt ROS is negative for all not previously mentioned.  Subjective: "I was working on shoes with my partner."  Objective: Vitals:   12/31/19 1425 12/31/19 2150 01/01/20 0526 01/01/20 1341  BP: 137/79 135/73 128/62 130/77  Pulse: (!) 104 100 88 (!) 103  Resp: 17 18 17 16   Temp: 98.1 F (36.7 C) 98 F (36.7 C) 97.9 F (36.6 C) 97.7 F (36.5 C)  TempSrc: Oral Oral Oral Oral  SpO2: 97% 96% 98% 97%  Weight:      Height:        Intake/Output Summary (Last 24 hours) at 01/01/2020 1411 Last data filed at 12/31/2019 2233 Gross per 24 hour  Intake 120 ml  Output 300 ml  Net -180 ml   Filed Weights   12/27/19 1248 12/28/19 0103 12/28/19 1732  Weight: 72.6 kg 62.1 kg 62.7 kg    Examination:  General: 74 y.o. male resting in bed in NAD Cardiovascular: RRR, +S1, S2, no g/, 1/6 SEMr Respiratory: CTABL, no w/r/r, normal WOB GI: BS+, NDNT, no masses noted, no organomegaly noted MSK: No e/c/c Neuro: Alert to name, follows commands  Data Reviewed: I have personally reviewed following labs and imaging studies.  CBC: Recent Labs  Lab 12/27/19 1326 12/28/19 0811 12/29/19 0603 12/30/19 0500 12/31/19 0629 01/01/20 0625  WBC 3.7*  --  3.5* 4.3 4.5 5.0  NEUTROABS 2.8  --   --   --   --   --     HGB 8.0*  --  7.9* 8.0* 8.1* 8.1*  HCT 24.7* 24.5* 24.7* 25.3* 25.6* 25.6*  MCV 103.8*  --  106.0* 106.3* 107.6* 108.0*  PLT 28*  --  26* 28* 24* 21*   Basic Metabolic Panel: Recent Labs  Lab 12/27/19 1326 12/29/19 0603 12/30/19 0500 12/31/19 0629 01/01/20 0625  NA 134* 137 137 134* 141  K 3.9 4.2 4.3 4.2 4.3  CL 102 105 106 106 111  CO2 21* 21* 21* 22 23  GLUCOSE 111* 132* 125* 141* 120*  BUN 13 22 29* 30* 29*  CREATININE 0.64 0.59* 0.87 0.77 0.59*  CALCIUM 8.0* 8.3* 8.0* 8.3* 8.4*  MG  --  2.2 2.3 2.3 2.4  PHOS  --   --  2.9 2.7 2.8   GFR: Estimated Creatinine Clearance: 69.7 mL/min (A) (by C-G formula based on SCr of 0.59 mg/dL (L)). Liver Function Tests: Recent Labs  Lab 12/27/19 1326 12/30/19 0500 12/31/19 0629 01/01/20 0625  AST 17  --   --   --  ALT 14  --   --   --   ALKPHOS 605*  --   --   --   BILITOT 1.7*  --   --   --   PROT 5.5*  --   --   --   ALBUMIN 2.7* 3.0* 2.9* 3.0*   No results for input(s): LIPASE, AMYLASE in the last 168 hours. No results for input(s): AMMONIA in the last 168 hours. Coagulation Profile: No results for input(s): INR, PROTIME in the last 168 hours. Cardiac Enzymes: No results for input(s): CKTOTAL, CKMB, CKMBINDEX, TROPONINI in the last 168 hours. BNP (last 3 results) No results for input(s): PROBNP in the last 8760 hours. HbA1C: No results for input(s): HGBA1C in the last 72 hours. CBG: No results for input(s): GLUCAP in the last 168 hours. Lipid Profile: No results for input(s): CHOL, HDL, LDLCALC, TRIG, CHOLHDL, LDLDIRECT in the last 72 hours. Thyroid Function Tests: No results for input(s): TSH, T4TOTAL, FREET4, T3FREE, THYROIDAB in the last 72 hours. Anemia Panel: No results for input(s): VITAMINB12, FOLATE, FERRITIN, TIBC, IRON, RETICCTPCT in the last 72 hours. Sepsis Labs: No results for input(s): PROCALCITON, LATICACIDVEN in the last 168 hours.  Recent Results (from the past 240 hour(s))  SARS Coronavirus 2  by RT PCR (hospital order, performed in [facility] hospital lab) Nasopharyngeal Nasopharyngeal Swab     Status: None   Collection Time: 12/27/19  7:36 PM   Specimen: Nasopharyngeal Swab  Result Value Ref Range Status   SARS Coronavirus 2 NEGATIVE NEGATIVE Final    Comment: (NOTE) SARS-CoV-2 target nucleic acids are NOT DETECTED. The SARS-CoV-2 RNA is generally detectable in upper and lower respiratory specimens during the acute phase of infection. The lowest concentration of SARS-CoV-2 viral copies this assay can detect is 250 copies / mL. A negative result does not preclude SARS-CoV-2 infection and should not be used as the sole basis for treatment or other patient management decisions.  A negative result may occur with improper specimen collection / handling, submission of specimen other than nasopharyngeal swab, presence of viral mutation(s) within the areas targeted by this assay, and inadequate number of viral copies (<250 copies / mL). A negative result must be combined with clinical observations, patient history, and epidemiological information. Fact Sheet for Patients:   StrictlyIdeas.no Fact Sheet for Healthcare Providers: BankingDealers.co.za This test is not yet approved or cleared  by the [place] FDA and has been authorized for detection and/or diagnosis of SARS-CoV-2 by FDA under an Emergency Use Authorization (EUA).  This EUA will remain in effect (meaning this test can be used) for the duration of the COVID-19 declaration under Section 564(b)(1) of the Act, 21 U.S.C. section 360bbb-3(b)(1), unless the authorization is terminated or revoked sooner. Performed at [facility], [place] [address] [place], [place] [address]   MRSA PCR Screening     Status: None   Collection Time: 12/28/19  6:04 AM   Specimen: Nasal Mucosa; Nasopharyngeal  Result Value Ref Range Status   MRSA by PCR NEGATIVE NEGATIVE  Final    Comment:        The GeneXpert MRSA Assay (FDA approved for NASAL specimens only), is one component of a comprehensive MRSA colonization surveillance program. It is not intended to diagnose MRSA infection nor to guide or monitor treatment for MRSA infections. Performed at [facility], [place] [address], [place], [place] [address]       Radiology Studies: No results found.   Scheduled Meds: .  amLODipine  5 mg Oral Daily  . atorvastatin  20 mg Oral Daily  . Chlorhexidine Gluconate Cloth  6 each Topical Daily  . dexamethasone (DECADRON) injection  4 mg Intravenous Q6H  . docusate sodium  100 mg Oral BID  . feeding supplement (ENSURE ENLIVE)  237 mL Oral BID BM  . megestrol  400 mg Oral Daily  . melatonin  5 mg Oral QHS  . mirtazapine  7.5 mg Oral QHS  . multivitamin with minerals  1 tablet Oral Daily  . vitamin B-12  500 mcg Oral Daily  . Vitamin D (Ergocalciferol)  50,000 Units Oral Q Wed   Continuous Infusions:   LOS: 5 days    Time spent: 25 minutes spent in the coordination of care today.    Jonnie Finner, DO [facility]  If 7PM-7AM, please contact night-coverage [email or web address] 01/01/2020, 2:11 PM

## 2020-01-01 NOTE — Progress Notes (Signed)
Alec Harris was bedside when I arrived; pt was resting but aroused during my visit.  His wife spoke of how much his health has declined within the last week. She said he is going home Tuesday with Hospice care. She said she had not heard from hospice yet regarding details of his care. She said their son, who is in his forties, lives with them and she hopes the two of them can take care of him.  She became tearful as she discussed his rapid decline. She said he was diagnosed with cancer in 2003 but said now it has spread to his bones.  I listened empathically and encouraged her to talk about how she was doing.  She said she was doing okay but then starting crying. She is concerned about taking care of him and about how quickly he has lost mobility.  She said he was able to use his walker but doesn't feel he can do that now.  She asked for prayer, which I provided bedside.  Alec Harris had awakened by that time and was able to join [place] in prayer. Please page if additional support is needed. [place], MDiv   01/01/20 1800  Clinical Encounter Type  Visited With Patient and family together

## 2020-01-01 NOTE — Progress Notes (Signed)
  Radiation Oncology         [phone]) [phone] ________________________________  Name: Lindan Brothers MRN: [id number]  Date: 12/28/2019  DOB: Dec 06, 1945  INPATIENT SIMULATION AND TREATMENT PLANNING NOTE    ICD-10-CM   1. Metastasis to spinal column (HCC)  C79.51     DIAGNOSIS:  74 yo man with spinal cord compression at T7-9 from castrate resistant stage IV prostate cancer  NARRATIVE:  Due to the emergent nature of this patient's treatment, and arrival to the clinic after normal hours, the patient was brought to the linac treatment suite.  Identity was confirmed.  All relevant records and images related to the planned course of therapy were reviewed.  The patient freely provided informed written consent to proceed with treatment after reviewing the details related to the planned course of therapy. The consent form was witnessed and verified by the simulation staff.  Then, the patient was set-up in a stable reproducible  supine position for radiation therapy.  CB-CT images were obtained.  Surface markings were placed.  A rectangular field was applied based on diagnostic imaging and dosimetry was calculated for mid-plan treatment.  The radiation prescription was entered and confirmed.  Then, I designed and supervised the construction of a total of 2 medically necessary complex treatment devices to shape the fields with MLCs to shield the lungs.  I have requested a dose calculation.   He'll return for more formal CT simulation and treatment planning  PLAN:  The patient will receive 20 Gy in 5 fractions.  ________________________________  Sheral Apley Tammi Klippel, M.D.

## 2020-01-01 NOTE — Progress Notes (Signed)
CRITICAL VALUE ALERT  Critical Value:  Platelets 21  Date & Time Notied:  01/01/2020 at 0657  Provider Notified: n/a; results consistent with previous lab values  Orders Received/Actions taken: n/a; results consistent with previous lab values.

## 2020-01-02 ENCOUNTER — Ambulatory Visit: Payer: Medicare Other

## 2020-01-02 ENCOUNTER — Ambulatory Visit
Admission: RE | Admit: 2020-01-02 | Discharge: 2020-01-02 | Disposition: A | Discharge status: Home / Self Care | Payer: Medicare Other | Source: Ambulatory Visit | Attending: Radiation Oncology | Admitting: Radiation Oncology

## 2020-01-02 ENCOUNTER — Inpatient Hospital Stay: Payer: Medicare Other

## 2020-01-02 LAB — CBC
HCT: 26 % — ABNORMAL LOW (ref 39.0–52.0)
Hemoglobin: 8.1 g/dL — ABNORMAL LOW (ref 13.0–17.0)
MCH: 34.2 pg — ABNORMAL HIGH (ref 26.0–34.0)
MCHC: 31.2 g/dL (ref 30.0–36.0)
MCV: 109.7 fL — ABNORMAL HIGH (ref 80.0–100.0)
Platelets: 19 10*3/uL — CL (ref 150–400)
RBC: 2.37 MIL/uL — ABNORMAL LOW (ref 4.22–5.81)
RDW: 21.8 % — ABNORMAL HIGH (ref 11.5–15.5)
WBC: 6 10*3/uL (ref 4.0–10.5)
nRBC: 13.2 % — ABNORMAL HIGH (ref 0.0–0.2)

## 2020-01-02 LAB — MAGNESIUM: Magnesium: 2.5 mg/dL — ABNORMAL HIGH (ref 1.7–2.4)

## 2020-01-02 MED ORDER — AMLODIPINE BESYLATE 10 MG PO TABS
10.0000 mg | ORAL_TABLET | Freq: Every day | ORAL | Status: DC
  Administered 2020-01-03: 10 mg via ORAL
  Filled 2020-01-02: qty 1

## 2020-01-02 NOTE — Progress Notes (Signed)
  Radiation Oncology         [phone] ________________________________  Name: Alec Harris MRN: [id number]  Date: 12/29/2019  DOB: 09-17-1945       INPATIENT  SIMULATION AND TREATMENT PLANNING NOTE    ICD-10-CM   1. Prostate cancer ([place])  C61      ICD-10-CM   1. Metastasis to spinal column (HCC)  C79.51     DIAGNOSIS:  74 yo man with spinal cord compression at T7-9 from castrate resistant stage IV prostate cancer  NARRATIVE:  The patient was brought to the [place] suite to continue the emergency course of radiotherapy started on the linac yesterday.  Identity was confirmed.  All relevant records and images related to the planned course of therapy were reviewed.  The patient was set-up in a stable reproducible  supine position for radiation therapy.  CT images were obtained.  Surface markings were placed.  The CT images were loaded into the planning software.  Then the target and avoidance structures were contoured.  Treatment planning then occurred.  The radiation prescription was entered and confirmed.  Then, I designed and supervised the construction of a total of 2 medically necessary complex treatment devices in the form of 2 MLC apertures to shield the lungs.  I have requested : 3D Simulation  I have requested a DVH of the following structures: Left lung, Right Lung and heart in addition to the targets.  SPECIAL TREATMENT PROCEDURE:  The planned course of therapy using radiation constitutes a special treatment procedure. Special care is required in the management of this patient for the following reasons. This treatment constitutes a Special Treatment Procedure for the following reason: [ Retreatment in a previously radiated area requiring careful monitoring of increased risk of toxicity due to overlap of previous treatment..  The special nature of the planned course of radiotherapy will require increased physician supervision and oversight to ensure patient's safety with  optimal treatment outcomes.  PLAN:  The patient will receive a total dose of 20 Gy in 5 fractions.  Since he received his first fraction yesterday emergently, the current plan will be delivered with 4 subsequent fractions adding 16 Gy.  ________________________________  Sheral Apley. Tammi Klippel, M.D.

## 2020-01-02 NOTE — Progress Notes (Signed)
Daily Progress Note   Patient Name: Alec Harris       Date: 01/02/2020 DOB: 01-Apr-1946  Age: 74 y.o. MRN#: [id number] Attending Physician: Jonnie Finner, DO Primary Care Physician: Patient, No Pcp Per Admit Date: 12/27/2019  Reason for Consultation/Follow-up: Establishing goals of care  Subjective:  resting in bed, awaiting today's radiation. Wife at bedside asking to discuss with hospice liaison about the plan for home with hospice discharge on 5-18 after radiation is completed. Notified hospice liaison. Appreciate chaplain follow up.  See below.   Length of Stay: 6  Current Medications: Scheduled Meds:  . amLODipine  5 mg Oral Daily  . atorvastatin  20 mg Oral Daily  . Chlorhexidine Gluconate Cloth  6 each Topical Daily  . dexamethasone (DECADRON) injection  4 mg Intravenous Q6H  . docusate sodium  100 mg Oral BID  . feeding supplement (ENSURE ENLIVE)  237 mL Oral BID BM  . Gerhardt's butt cream   Topical TID  . megestrol  400 mg Oral Daily  . melatonin  5 mg Oral QHS  . mirtazapine  7.5 mg Oral QHS  . multivitamin with minerals  1 tablet Oral Daily  . vitamin B-12  500 mcg Oral Daily  . Vitamin D (Ergocalciferol)  50,000 Units Oral Q Wed    Continuous Infusions:   PRN Meds: acetaminophen, bisacodyl, HYDROcodone-acetaminophen, oxyCODONE, polyethylene glycol, senna-docusate  Physical Exam         Awake alert Flat affect No distress Denies pain Regular work of breathing S1 S2 Abdomen not distended Has muscle wasting Non focal  Vital Signs: BP (!) 155/79 (BP Location: Right Arm)   Pulse (!) 102   Temp 98.1 F (36.7 C)   Resp 14   Ht 5\' 7"  (1.702 m)   Wt 62.7 kg   SpO2 98%   BMI 21.65 kg/m  SpO2: SpO2: 98 % O2 Device: O2 Device: Room Air O2 Flow Rate:     Intake/output summary:   Intake/Output Summary (Last 24 hours) at 01/02/2020 1104 Last data filed at 01/01/2020 1700 Gross per 24 hour  Intake -  Output 650 ml  Net -650 ml   LBM: Last BM Date: (PTA) Baseline Weight: Weight: 72.6 kg Most recent weight: Weight: 62.7 kg       Palliative Assessment/Data:  Patient Active Problem List   Diagnosis Date Noted  . Metastasis to spinal column ([place]) 01/01/2020  . Pressure injury of skin 12/28/2019  . Lower extremity weakness 12/27/2019  . Essential hypertension 12/27/2019  . Malignant neoplasm of prostate metastatic to bone ([place]) 06/10/2019  . Encounter for antineoplastic chemotherapy 09/16/2017  . Port-A-Cath in place 07/15/2017  . Goals of care, counseling/discussion 05/21/2017  . Aftercare following surgery of the circulatory system, [place] 08/15/2013  . PVD (peripheral vascular disease) with claudication ([place]) 12/29/2012  . Carotid disease, bilateral ([place]) 12/29/2012  . Hyperlipemia 12/29/2012  . Prostate cancer ([place]) 07/07/2012  . Occlusion and stenosis of carotid artery without mention of cerebral infarction 03/29/2012    Palliative Care Assessment & Plan   Patient Profile:    Assessment:  74 year old with metastatic prostate cancer to bone on the palliative radiation, PVD, carotid stenosis, HLD admitted for bilateral lower extremity weakness and recommendations of radiation oncology. Progressively had difficulty with moving his lower extremities over the past month. Denies any bowel bladder incontinence. MRI spine showed widespread metastatic disease throughout cervical, thoracic and lumbar spine with epidural tumor T7-9 causing cord deformity. Neurosurgery consulted. Started on Decadron. Started on palliative radiation.  Palliative care has been consulted for goals of care.    Recommendations/Plan:   home with hospice on 5-18 after radiation is completed. Discussed with patient and wife  Continue current mode  of care  Discussed with hospice liaison.     Code Status:    Code Status Orders  (From admission, onward)         Start     Ordered   12/27/19 2300  Do not attempt resuscitation (DNR)  Continuous    Question Answer Comment  In the event of cardiac or respiratory ARREST Do not call a "code blue"   In the event of cardiac or respiratory ARREST Do not perform Intubation, CPR, defibrillation or ACLS   In the event of cardiac or respiratory ARREST Use medication by any route, position, wound care, and other measures to relive pain and suffering. May use oxygen, suction and manual treatment of airway obstruction as needed for comfort.      12/27/19 2259        Code Status History    Date Active Date Inactive Code Status Order ID Comments User Context   04/08/2012 1550 04/09/2012 1406 Full Code [id number]  Roselee Nova, RN Inpatient   Advance Care Planning Activity    Advance Directive Documentation     Most Recent Value  Type of Advance Directive  Out of facility DNR (pink MOST or yellow form)  Pre-existing out of facility DNR order (yellow form or pink MOST form)  -  "MOST" Form in Place?  -       Prognosis:   < 3 months  Discharge Planning:  Home with Hospice  Care plan was discussed with  Patient, wife and also hospice notified.   Thank you for allowing the Palliative Medicine Team to assist in the care of this patient.   Time In: 10.30 Time Out: 10.55 Total Time 25 Prolonged Time Billed NO      Greater than 50%  of this time was spent counseling and coordinating care related to the above assessment and plan.  Loistine Chance, MD  Please contact Palliative Medicine Team phone at [phone] for questions and concerns.

## 2020-01-02 NOTE — Progress Notes (Signed)
Manufacturing engineer [facility]) Hospital Liaison Note:  Spoke to patient spouse to support and confirm interest in going home with hospice upon discharge.  Answered questions and reviewed Hospice services with spouse. Family has [facility] contact information and encouraged to call as needed.  Per spouse, plan is to discharge tomorrow via ambulance. [place] Admission nurse to visit at 10 am on 01/04/20.  DME ordered: Hospital Bed and over the bedside table.   Please send DNR form home with patient.   Patient will need prescriptions upon discharge for comfort medications.  Please fax discharge summary to [phone]  Thank you for this referral,  Gar Ponto, RN [place] [facility] (on [place]) [phone]

## 2020-01-02 NOTE — Care Management Important Message (Signed)
Important Message  Patient Details IM Letter given to Marney Doctor RN Case Manager to present to the Patient Name: Alec Harris MRN: [id number] Date of Birth: Oct 06, 1945   Medicare Important Message Given:  Yes     Kerin Salen 01/02/2020, 10:41 AM

## 2020-01-02 NOTE — Progress Notes (Signed)
Alec Kitchen  PROGRESS NOTE    Alec Harris  [id number] DOB: [birth year] DOA: 12/27/2019 PCP: Patient, No Pcp Per   Brief Narrative:   74 year old with metastatic prostate cancer to bone on the palliative radiation, PVD, carotid stenosis, HLD admitted for bilateral lower extremity weakness and recommendations of radiation oncology. Progressively had difficulty with moving his lower extremities over the past month. Denies any bowel bladder incontinence. MRI spine showed widespread metastatic disease throughout cervical, thoracic and lumbar spine with epidural tumor T7-9 causing cord deformity. Neurosurgery consulted. Started on Decadron.  5/13: Pt presenting for palliative radiation. Spoke with wife/pt about palliative care/hospice, but they seem confused on the subject. The patient asked what was the purpose of this radiation as if he was not under the understanding that this is palliative therapy. Palliative care has been consulted.  5/14: Continuing palliative XRT. Add BM regimen. Home with hospice after.  5/15: Home hospice referral sent. PT/OT consulted. Continuing palliative XRT. Home next 24- 48hrs?  5/16: XRT tomorrow and Tuesday, then home with hospice.  5/17: XRT today/tomorrow, then home with hospice. DME ordered. No changes for today.    Assessment & Plan:   Principal Problem:   Lower extremity weakness Active Problems:   Prostate cancer (HCC)   PVD (peripheral vascular disease) with claudication (HCC)   Hyperlipemia   Essential hypertension   Pressure injury of skin  BLE weakness with metastatic bone disease and new epidural tumor around cord of T7-T9 Metastatic prostatic adenocarcinoma; s/p prostatectomy - Case reviewed by neurosurgery, radiation oncology and oncology. - Pt has been transferred to [facility] for palliative radiation - 5/13: have reviewed chart; per onco " I reiterated to these findings today which include a rapid progression of his disease at  this time. He is approaching end-stage prostate cancer and any treatment would offer some palliation of his pain and mobility it would be reasonable to do.He understands that no additional anticancer therapy will be prescribed. Transitioning to hospice would be his next step upon completing radiation. His prognosis is poor with limited life expectancy" - per neurosurg: "Given the end-stage nature of his disease and significant comorbidities/medical condition as well as the fact that recovery from surgery would not be trivial, I think that primary palliative radiation is the best option in this case rather than surgical decompression followed by radiation. I have reviewed the situation with the patient and his wife. We discussed the options of surgery + radiation vs radiation alone. After d/w rad-onc, they are agreeable to primary palliative radiation. All their questions were answered." - Palliative care team consulted to help establish goals of care. - continue decadron 4 mg q6h - 5/14: continue palliative XRT. Home with hospice after. - 5/15: PT/OT consulted. Hospice referral made. Continue XRT.     - 5/16: PT/OT have seen. See their notes. Appreciate assistance. 2 more doses of XRT (tomorrow and Tuesday) then home with hospice.     - 5/17: plan unchanged from above   Pancytopenia with severe thrombocytopenia - Bone marrow suppression from metastatic disease and radiation - Dr. [name] Blew onboard, will differ need to plt transfusion to him - ASA was held and Cilostazol continued - 5/13: labs are essentially stable from yesterday; continue to follow - hold pletal     - 5/16: slow downtrend. No evidence of bleed. Monitor.  Elevated ALP - Secondary to metastatic bone disease; see above  Hypertension - Continue amlodipine - 5/13: BP is ok this AM, continue as above - 5/14: stable, continue as  above. - 5/15: he is stable. Continue to  monitor.     - 5/17: increase norvasc to 10mg .   Hyperlipidemia - Continue statin  PVD - ASA was held and Cilostazol continued - 5/13: hold pletal for thrombocytopenia - 5/15: these numbers are stable; monitor     - 5/17: plts at 19, no bleeds noted; stop lab draws  Constipation - colace, miralax  Vitamin D deficiency - continue vit D  Sacral ulcer      - S2; chronicity unknown      - WOCN  DVT prophylaxis: SCDs Code Status: DNR   Status is: Inpatient  Remains inpatient appropriate because:Ongoing active pain requiring inpatient pain management   Dispo: The patient is from: Home              Anticipated d/c is to: Home              Anticipated d/c date is: 1 day              Patient currently is not medically stable to d/c.  Consultants:   Heme/Onc  Radiation Oncology  Palliative Care  Procedures:   XRT  ROS:  Denies CP, N, V, ab pain . Remainder 10-pt ROS is negative for all not previously mentioned.  Subjective: "I don't know where my partner went."  Objective: Vitals:   12/31/19 2150 01/01/20 0526 01/01/20 1341 01/02/20 0634  BP: 135/73 128/62 130/77 (!) 155/79  Pulse: 100 88 (!) 103 (!) 102  Resp: 18 17 16 14   Temp: 98 F (36.7 C) 97.9 F (36.6 C) 97.7 F (36.5 C) 98.1 F (36.7 C)  TempSrc: Oral Oral Oral   SpO2: 96% 98% 97% 98%  Weight:      Height:        Intake/Output Summary (Last 24 hours) at 01/02/2020 1259 Last data filed at 01/01/2020 1700 Gross per 24 hour  Intake --  Output 650 ml  Net -650 ml   Filed Weights   12/27/19 1248 12/28/19 0103 12/28/19 1732  Weight: 72.6 kg 62.1 kg 62.7 kg    Examination:  General: 74 y.o. ill appearing male resting in bed Cardiovascular: RRR, +S1, S2, 1/6 SEM Respiratory: CTABL, no w/r/r, normal WOB GI: BS+, NDNT, no masses noted, no organomegaly noted MSK: No e/c/c Neuro: Alert to name, follows commands  Data Reviewed: I have personally reviewed following  labs and imaging studies.  CBC: Recent Labs  Lab 12/27/19 1326 12/28/19 0811 12/29/19 0603 12/30/19 0500 12/31/19 0629 01/01/20 0625 01/02/20 0500  WBC 3.7*  --  3.5* 4.3 4.5 5.0 6.0  NEUTROABS 2.8  --   --   --   --   --   --   HGB 8.0*  --  7.9* 8.0* 8.1* 8.1* 8.1*  HCT 24.7*   < > 24.7* 25.3* 25.6* 25.6* 26.0*  MCV 103.8*  --  106.0* 106.3* 107.6* 108.0* 109.7*  PLT 28*  --  26* 28* 24* 21* 19*   < > = values in this interval not displayed.   Basic Metabolic Panel: Recent Labs  Lab 12/27/19 1326 12/29/19 0603 12/30/19 0500 12/31/19 0629 01/01/20 0625 01/02/20 0500  NA 134* 137 137 134* 141  --   K 3.9 4.2 4.3 4.2 4.3  --   CL 102 105 106 106 111  --   CO2 21* 21* 21* 22 23  --   GLUCOSE 111* 132* 125* 141* 120*  --   BUN 13 22 29* 30* 29*  --  CREATININE 0.64 0.59* 0.87 0.77 0.59*  --   CALCIUM 8.0* 8.3* 8.0* 8.3* 8.4*  --   MG  --  2.2 2.3 2.3 2.4 2.5*  PHOS  --   --  2.9 2.7 2.8  --    GFR: Estimated Creatinine Clearance: 69.7 mL/min (A) (by C-G formula based on SCr of 0.59 mg/dL (L)). Liver Function Tests: Recent Labs  Lab 12/27/19 1326 12/30/19 0500 12/31/19 0629 01/01/20 0625  AST 17  --   --   --   ALT 14  --   --   --   ALKPHOS 605*  --   --   --   BILITOT 1.7*  --   --   --   PROT 5.5*  --   --   --   ALBUMIN 2.7* 3.0* 2.9* 3.0*   No results for input(s): LIPASE, AMYLASE in the last 168 hours. No results for input(s): AMMONIA in the last 168 hours. Coagulation Profile: No results for input(s): INR, PROTIME in the last 168 hours. Cardiac Enzymes: No results for input(s): CKTOTAL, CKMB, CKMBINDEX, TROPONINI in the last 168 hours. BNP (last 3 results) No results for input(s): PROBNP in the last 8760 hours. HbA1C: No results for input(s): HGBA1C in the last 72 hours. CBG: No results for input(s): GLUCAP in the last 168 hours. Lipid Profile: No results for input(s): CHOL, HDL, LDLCALC, TRIG, CHOLHDL, LDLDIRECT in the last 72 hours. Thyroid  Function Tests: No results for input(s): TSH, T4TOTAL, FREET4, T3FREE, THYROIDAB in the last 72 hours. Anemia Panel: No results for input(s): VITAMINB12, FOLATE, FERRITIN, TIBC, IRON, RETICCTPCT in the last 72 hours. Sepsis Labs: No results for input(s): PROCALCITON, LATICACIDVEN in the last 168 hours.  Recent Results (from the past 240 hour(s))  SARS Coronavirus 2 by RT PCR (hospital order, performed in [facility] hospital lab) Nasopharyngeal Nasopharyngeal Swab     Status: None   Collection Time: 12/27/19  7:36 PM   Specimen: Nasopharyngeal Swab  Result Value Ref Range Status   SARS Coronavirus 2 NEGATIVE NEGATIVE Final    Comment: (NOTE) SARS-CoV-2 target nucleic acids are NOT DETECTED. The SARS-CoV-2 RNA is generally detectable in upper and lower respiratory specimens during the acute phase of infection. The lowest concentration of SARS-CoV-2 viral copies this assay can detect is 250 copies / mL. A negative result does not preclude SARS-CoV-2 infection and should not be used as the sole basis for treatment or other patient management decisions.  A negative result may occur with improper specimen collection / handling, submission of specimen other than nasopharyngeal swab, presence of viral mutation(s) within the areas targeted by this assay, and inadequate number of viral copies (<250 copies / mL). A negative result must be combined with clinical observations, patient history, and epidemiological information. Fact Sheet for Patients:   StrictlyIdeas.no Fact Sheet for Healthcare Providers: BankingDealers.co.za This test is not yet approved or cleared  by the [place] FDA and has been authorized for detection and/or diagnosis of SARS-CoV-2 by FDA under an Emergency Use Authorization (EUA).  This EUA will remain in effect (meaning this test can be used) for the duration of the COVID-19 declaration under Section 564(b)(1) of the  Act, 21 U.S.C. section 360bbb-3(b)(1), unless the authorization is terminated or revoked sooner. Performed at [facility], [place] [address], [place], [place] [address]   MRSA PCR Screening     Status: None   Collection Time: 12/28/19  6:04 AM   Specimen: Nasal Mucosa; Nasopharyngeal  Result Value Ref Range Status   MRSA by PCR NEGATIVE NEGATIVE Final    Comment:        The GeneXpert MRSA Assay (FDA approved for NASAL specimens only), is one component of a comprehensive MRSA colonization surveillance program. It is not intended to diagnose MRSA infection nor to guide or monitor treatment for MRSA infections. Performed at [facility], [place] [address] [place], [place] [address]       Radiology Studies: No results found.   Scheduled Meds: . amLODipine  5 mg Oral Daily  . atorvastatin  20 mg Oral Daily  . Chlorhexidine Gluconate Cloth  6 each Topical Daily  . dexamethasone (DECADRON) injection  4 mg Intravenous Q6H  . docusate sodium  100 mg Oral BID  . feeding supplement (ENSURE ENLIVE)  237 mL Oral BID BM  . Gerhardt's butt cream   Topical TID  . megestrol  400 mg Oral Daily  . melatonin  5 mg Oral QHS  . mirtazapine  7.5 mg Oral QHS  . multivitamin with minerals  1 tablet Oral Daily  . vitamin B-12  500 mcg Oral Daily  . Vitamin D (Ergocalciferol)  50,000 Units Oral Q Wed   Continuous Infusions:   LOS: 6 days    Time spent: 25 minutes spent in the coordination of care today.    Alec Finner, DO [facility]  If 7PM-7AM, please contact night-coverage [email or web address] 01/02/2020, 12:59 PM

## 2020-01-03 ENCOUNTER — Ambulatory Visit
Admission: RE | Admit: 2020-01-03 | Discharge: 2020-01-03 | Disposition: A | Discharge status: Home / Self Care | Payer: Medicare Other | Source: Ambulatory Visit | Attending: Radiation Oncology | Admitting: Radiation Oncology

## 2020-01-03 ENCOUNTER — Ambulatory Visit: Payer: Medicare Other

## 2020-01-03 ENCOUNTER — Encounter: Payer: Self-pay | Admitting: Urology

## 2020-01-03 ENCOUNTER — Encounter: Payer: Self-pay | Admitting: Radiation Oncology

## 2020-01-03 DIAGNOSIS — L89159 Pressure ulcer of sacral region, unspecified stage: Secondary | ICD-10-CM

## 2020-01-03 MED ORDER — OXYCODONE HCL 5 MG/5ML PO SOLN: 5.0000 mg | ORAL | 0 refills | Status: AC | PRN

## 2020-01-03 MED ORDER — LORAZEPAM 2 MG/ML PO CONC: 2.0000 mg | Freq: Three times a day (TID) | ORAL | 0 refills | Status: AC | PRN

## 2020-01-03 MED ORDER — DEXAMETHASONE 4 MG PO TABS: ORAL_TABLET | ORAL | 0 refills | Status: AC

## 2020-01-03 MED ORDER — OXYCODONE HCL 5 MG PO TABS: 5.0000 mg | ORAL_TABLET | ORAL | 0 refills | Status: DC | PRN

## 2020-01-03 MED ORDER — HEPARIN SOD (PORK) LOCK FLUSH 100 UNIT/ML IV SOLN
500.0000 [IU] | Freq: Once | INTRAVENOUS | Status: AC
  Administered 2020-01-03: 500 [IU] via INTRAVENOUS
  Filled 2020-01-03: qty 5

## 2020-01-03 NOTE — TOC Transition Note (Signed)
Transition of Care [facility]) - CM/SW Discharge Note   Patient Details  Name: Alec Harris MRN: [id number] Date of Birth: October 28, 1945  Transition of [facility]) CM/SW Contact:  Lynnell Catalan, RN Phone Number: 01/03/2020, 2:43 PM   Clinical Narrative:    Pt to dc home with [facility] hospice services today. Wife confirms delivery of hospital bed. Yellow DNR signed to be sent home. [facility] contacted for transport home.     Barriers to Discharge: Continued Medical Work up   Patient Goals and CMS Choice Patient states their goals for this hospitalization and ongoing recovery are:: to go home with hospice services CMS Medicare.gov Compare Post Acute Care list provided to:: Patient Represenative (must comment) Choice offered to / list presented to : Spouse  Discharge Plan and Services   Discharge Planning Services: CM Consult Post Acute Care Choice: Hospice               Readmission Risk Interventions No flowsheet data found.

## 2020-01-03 NOTE — Discharge Summary (Addendum)
Physician Discharge Summary  Alec Harris [id number] DOB: 01-15-1946 DOA: 12/27/2019  PCP: Patient, No Pcp Per  Admit date: 12/27/2019 Discharge date: 01/03/2020  Admitted From: Home Disposition:  Discharged to home with home hospice.  Recommendations for Outpatient Follow-up:  1. Follow up with hospice.  Discharge Condition: Stable  CODE STATUS: DNR   Brief/Interim Summary: 74 year old with metastatic prostate cancer to bone on the palliative radiation, PVD, carotid stenosis, HLD admitted for bilateral lower extremity weakness and recommendations of radiation oncology. Progressively had difficulty with moving his lower extremities over the past month. Denies any bowel bladder incontinence. MRI spine showed widespread metastatic disease throughout cervical, thoracic and lumbar spine with epidural tumor T7-9 causing cord deformity. Neurosurgery consulted. Started on Decadron.  5/13: Pt presenting for palliative radiation. Spoke with wife/pt about palliative care/hospice, but they seem confused on the subject. The patient asked what was the purpose of this radiation as if he was not under the understanding that this is palliative therapy. Palliative care has been consulted.  5/14: Continuing palliative XRT. Add BM regimen. Home with hospice after.  5/15: Home hospice referral sent. PT/OT consulted. Continuing palliative XRT. Home next 24- 48hrs?  5/16: XRT tomorrow and Tuesday, then home with hospice.  5/17: XRT today/tomorrow, then home with hospice. DME ordered. No changes for today.   5/18: Last XRT today. Will discharge with home hospice.   Discharge Diagnoses:  Principal Problem:   Lower extremity weakness Active Problems:   Prostate cancer (HCC)   PVD (peripheral vascular disease) with claudication (HCC)   Hyperlipemia   Essential hypertension   Pressure injury of skin  BLE weakness with metastatic bone disease and new epidural tumor around cord of  T7-T9 Metastatic prostatic adenocarcinoma; s/p prostatectomy - Case reviewed by neurosurgery, radiation oncology and oncology. - Pt has been transferred to [facility] for palliative radiation - 5/13: have reviewed chart; per onco " I reiterated to these findings today which include a rapid progression of his disease at this time. He is approaching end-stage prostate cancer and any treatment would offer some palliation of his pain and mobility it would be reasonable to do.He understands that no additional anticancer therapy will be prescribed. Transitioning to hospice would be his next step upon completing radiation. His prognosis is poor with limited life expectancy" - per neurosurg: "Given the end-stage nature of his disease and significant comorbidities/medical condition as well as the fact that recovery from surgery would not be trivial, I think that primary palliative radiation is the best option in this case rather than surgical decompression followed by radiation. I have reviewed the situation with the patient and his wife. We discussed the options of surgery + radiation vs radiation alone. After d/w rad-onc, they are agreeable to primary palliative radiation. All their questions were answered." - Palliative care team consulted to help establish goals of care. - continue decadron 4 mg q6h - 5/18: last XRT session today, then home with hospice. Taper decadron over next week.  Pancytopenia with severe thrombocytopenia - Bone marrow suppression from metastatic disease and radiation - Dr. [name] Blew onboard, will differ need to plt transfusion to him - ASA was held and Cilostazol continued - 5/13: labs are essentially stable from yesterday; continue to follow - hold pletal - 5/16: slow downtrend. No evidence of bleed. Monitor.  Elevated ALP - Secondary to metastatic bone disease; see above  Hypertension - Continue amlodipine - 5/17: BP  ok this AM.   Hyperlipidemia - Continue statin  PVD - ASA was  held and Cilostazol continued - 5/13: hold pletal for thrombocytopenia - 5/15: these numbers are stable; monitor     - 5/17: plts at 19, no bleeds noted; stop lab draws  Constipation - colace, miralax  Vitamin D deficiency - continue vit D  Sacral ulcer - S2; chronicity unknown - WOCN  Discharge Instructions   Allergies as of 01/03/2020   No Known Allergies     Medication List    STOP taking these medications   cilostazol 100 MG tablet Commonly known as: PLETAL   HYDROcodone-acetaminophen 5-325 MG tablet Commonly known as: NORCO/VICODIN   oxyCODONE 5 MG immediate release tablet Commonly known as: Oxy IR/ROXICODONE Replaced by: oxyCODONE 5 MG/5ML solution   traMADol 50 MG tablet Commonly known as: ULTRAM     TAKE these medications   amLODipine 5 MG tablet Commonly known as: NORVASC TAKE 1 TABLET (5 MG TOTAL) BY MOUTH DAILY.   aspirin EC 81 MG tablet Take 81 mg by mouth daily.   atorvastatin 20 MG tablet Commonly known as: LIPITOR TAKE 1 TABLET BY MOUTH EVERY DAY   dexamethasone 4 MG tablet Commonly known as: Decadron Take 1 tablet (4 mg total) by mouth 2 (two) times daily for 4 days, THEN 1 tablet (4 mg total) daily for 3 days. Start taking on: Jan 03, 2020   lidocaine-prilocaine cream Commonly known as: EMLA Apply 1 application topically as needed.   LORazepam 2 MG/ML concentrated solution Commonly known as: ATIVAN Take 1 mL (2 mg total) by mouth every 8 (eight) hours as needed for up to 5 days for anxiety.   megestrol 40 MG/ML suspension Commonly known as: MEGACE TAKE 10 MLS (400 MG TOTAL) BY MOUTH DAILY. What changed: See the new instructions.   mirtazapine 7.5 MG tablet Commonly known as: REMERON Take 1 tablet (7.5 mg total) by mouth at bedtime.   oxyCODONE 5 MG/5ML solution Commonly known as: ROXICODONE Take 5 mLs (5 mg total) by  mouth every 4 (four) hours as needed for up to 5 days for severe pain. Replaces: oxyCODONE 5 MG immediate release tablet            Durable Medical Equipment  (From admission, onward)         Start     Ordered   01/01/20 1420  For home use only DME Hospital bed  Once    Question Answer Comment  Length of Need Lifetime   Patient has (list medical condition): End stage prostate cancer; home with hospice   The above medical condition requires: Patient requires the ability to reposition frequently   Head must be elevated greater than: 30 degrees   Bed type Semi-electric      01/01/20 1419          No Known Allergies  Consultations:  Heme/Onc  Radiation Oncology  Palliative Care   Procedures/Studies: MR TOTAL SPINE METS SCREENING  Result Date: 12/27/2019 CLINICAL DATA:  Myelopathy. Metastatic prostate cancer. Increased weakness and numbness of legs. EXAM: MRI TOTAL SPINE WITHOUT AND WITH CONTRAST TECHNIQUE: Multisequence MR imaging of the spine from the cervical spine to the sacrum was performed prior to and following IV contrast administration for evaluation of spinal metastatic disease. CONTRAST:  7.39mL GADAVIST GADOBUTROL 1 MMOL/ML IV SOLN COMPARISON:  None. FINDINGS: MRI CERVICAL SPINE FINDINGS Alignment: Normal Vertebrae: ACDF with anterior plate at 075-GRM. Metastatic disease C5, C6, and C7 vertebral bodies extending into the posterior elements. Negative for fracture Cord: Hyperintensity in the cord at C3-4 compatible with  chronic myelomalacia. This may be related to prior spinal stenosis. Spinal canal is adequate in size at this time. Posterior Fossa, vertebral arteries, paraspinal tissues: Negative Disc levels: Disc degeneration and spurring  C5-6 and C6-7.  No cord compression. Image quality degraded by motion throughout all of the study. MRI THORACIC SPINE FINDINGS Alignment:  Normal Vertebrae: Widespread metastatic disease throughout the vertebral bodies and posterior  elements of the entire thoracic spine. There is enhancing epidural tumor in the canal at T7 to T9 causing cord deformity and spinal stenosis. No frank cord compression. Limited anatomic detail due to motion. Cord: Normal cord signal. Epidural tumor surrounding the cord T7 through T9 Paraspinal and other soft tissues: No paraspinous mass. Bibasilar atelectasis. Disc levels: Limited evaluation of the disc spaces. There is mild disc degeneration in the thoracic spine without disc protrusion identified. MRI LUMBAR SPINE FINDINGS Segmentation: L5 is sacralized. There are 4 non-rib-bearing lumbar segments. Alignment:  Normal Vertebrae: Widespread metastatic disease throughout the vertebral bodies and posterior elements throughout the lumbar spine extending into the thoracic spine. There is a mild fracture of L1 which appears chronic. Conus medullaris: Extends to the L1-2 level and appears normal. Paraspinal and other soft tissues: Negative Disc levels: Mild disc degeneration at multiple levels. No disc protrusion or spinal stenosis. No epidural tumor in the lumbar canal. IMPRESSION: This study is degraded by significant motion Widespread metastatic disease throughout the cervical, thoracic, and lumbar spine. Epidural tumor in the canal surrounding the cord at T7 through T9 causing cord deformity and mild spinal stenosis. Chronic myelomalacia in the cervical spinal cord at C3-4 at the site of prior ACDF. Electronically Signed   By: Franchot Gallo M.D.   On: 12/27/2019 19:27   NM XOFIGO INJECTION  Result Date: 12/09/2019  Trudi Ida was injected intravenously in Nuclear Medicine under the supervision of the attending radiologist     Subjective: No acute events ON.   Discharge Exam: Vitals:   01/02/20 2041 01/03/20 0527  BP: 138/70 (!) 143/74  Pulse: (!) 104 (!) 103  Resp: 16 18  Temp: 98.4 F (36.9 C) 97.8 F (36.6 C)  SpO2: 97% 98%   Vitals:   01/02/20 0634 01/02/20 1302 01/02/20 2041 01/03/20 0527  BP:  (!) 155/79 (!) 148/81 138/70 (!) 143/74  Pulse: (!) 102 (!) 103 (!) 104 (!) 103  Resp: 14 14 16 18   Temp: 98.1 F (36.7 C) 97.9 F (36.6 C) 98.4 F (36.9 C) 97.8 F (36.6 C)  TempSrc:  Oral Oral Oral  SpO2: 98% 97% 97% 98%  Weight:      Height:       General: 74 y.o. ill appearing male resting in bed in NAD Cardiovascular: tachy, +S1, S2, no m/g/r Respiratory: CTABL, no w/r/r, normal WOB GI: BS+, NDNT, no masses noted, no organomegaly noted MSK: No e/c/c Neuro: Alert to name, follows commands  The results of significant diagnostics from this hospitalization (including imaging, microbiology, ancillary and laboratory) are listed below for reference.     Microbiology: Recent Results (from the past 240 hour(s))  SARS Coronavirus 2 by RT PCR (hospital order, performed in [facility] hospital lab) Nasopharyngeal Nasopharyngeal Swab     Status: None   Collection Time: 12/27/19  7:36 PM   Specimen: Nasopharyngeal Swab  Result Value Ref Range Status   SARS Coronavirus 2 NEGATIVE NEGATIVE Final    Comment: (NOTE) SARS-CoV-2 target nucleic acids are NOT DETECTED. The SARS-CoV-2 RNA is generally detectable in upper and lower respiratory specimens during  the acute phase of infection. The lowest concentration of SARS-CoV-2 viral copies this assay can detect is 250 copies / mL. A negative result does not preclude SARS-CoV-2 infection and should not be used as the sole basis for treatment or other patient management decisions.  A negative result may occur with improper specimen collection / handling, submission of specimen other than nasopharyngeal swab, presence of viral mutation(s) within the areas targeted by this assay, and inadequate number of viral copies (<250 copies / mL). A negative result must be combined with clinical observations, patient history, and epidemiological information. Fact Sheet for Patients:   StrictlyIdeas.no Fact Sheet for Healthcare  Providers: BankingDealers.co.za This test is not yet approved or cleared  by the [place] FDA and has been authorized for detection and/or diagnosis of SARS-CoV-2 by FDA under an Emergency Use Authorization (EUA).  This EUA will remain in effect (meaning this test can be used) for the duration of the COVID-19 declaration under Section 564(b)(1) of the Act, 21 U.S.C. section 360bbb-3(b)(1), unless the authorization is terminated or revoked sooner. Performed at [facility], [place] [address], [place], [place] [address]   MRSA PCR Screening     Status: None   Collection Time: 12/28/19  6:04 AM   Specimen: Nasal Mucosa; Nasopharyngeal  Result Value Ref Range Status   MRSA by PCR NEGATIVE NEGATIVE Final    Comment:        The GeneXpert MRSA Assay (FDA approved for NASAL specimens only), is one component of a comprehensive MRSA colonization surveillance program. It is not intended to diagnose MRSA infection nor to guide or monitor treatment for MRSA infections. Performed at [facility], [place] [address], [place], [place] [address]      Labs: BNP (last 3 results) No results for input(s): BNP in the last 8760 hours. Basic Metabolic Panel: Recent Labs  Lab 12/27/19 1326 12/29/19 0603 12/30/19 0500 12/31/19 0629 01/01/20 0625 01/02/20 0500  NA 134* 137 137 134* 141  --   K 3.9 4.2 4.3 4.2 4.3  --   CL 102 105 106 106 111  --   CO2 21* 21* 21* 22 23  --   GLUCOSE 111* 132* 125* 141* 120*  --   BUN 13 22 29* 30* 29*  --   CREATININE 0.64 0.59* 0.87 0.77 0.59*  --   CALCIUM 8.0* 8.3* 8.0* 8.3* 8.4*  --   MG  --  2.2 2.3 2.3 2.4 2.5*  PHOS  --   --  2.9 2.7 2.8  --    Liver Function Tests: Recent Labs  Lab 12/27/19 1326 12/30/19 0500 12/31/19 0629 01/01/20 0625  AST 17  --   --   --   ALT 14  --   --   --   ALKPHOS 605*  --   --   --   BILITOT 1.7*  --   --   --   PROT 5.5*  --   --   --   ALBUMIN 2.7* 3.0* 2.9* 3.0*    No results for input(s): LIPASE, AMYLASE in the last 168 hours. No results for input(s): AMMONIA in the last 168 hours. CBC: Recent Labs  Lab 12/27/19 1326 12/28/19 0811 12/29/19 0603 12/30/19 0500 12/31/19 0629 01/01/20 0625 01/02/20 0500  WBC 3.7*  --  3.5* 4.3 4.5 5.0 6.0  NEUTROABS 2.8  --   --   --   --   --   --   HGB 8.0*  --  7.9* 8.0* 8.1* 8.1* 8.1*  HCT 24.7*   < > 24.7* 25.3* 25.6* 25.6* 26.0*  MCV 103.8*  --  106.0* 106.3* 107.6* 108.0* 109.7*  PLT 28*  --  26* 28* 24* 21* 19*   < > = values in this interval not displayed.   Cardiac Enzymes: No results for input(s): CKTOTAL, CKMB, CKMBINDEX, TROPONINI in the last 168 hours. BNP: Invalid input(s): POCBNP CBG: No results for input(s): GLUCAP in the last 168 hours. D-Dimer No results for input(s): DDIMER in the last 72 hours. Hgb A1c No results for input(s): HGBA1C in the last 72 hours. Lipid Profile No results for input(s): CHOL, HDL, LDLCALC, TRIG, CHOLHDL, LDLDIRECT in the last 72 hours. Thyroid function studies No results for input(s): TSH, T4TOTAL, T3FREE, THYROIDAB in the last 72 hours.  Invalid input(s): FREET3 Anemia work up No results for input(s): VITAMINB12, FOLATE, FERRITIN, TIBC, IRON, RETICCTPCT in the last 72 hours. Urinalysis    Component Value Date/Time   COLORURINE YELLOW 04/02/2012 0832   APPEARANCEUR CLEAR 04/02/2012 0832   LABSPEC 1.020 07/09/2019 1123   PHURINE 6.5 07/09/2019 1123   GLUCOSEU NEGATIVE 07/09/2019 1123   HGBUR NEGATIVE 07/09/2019 [place] 07/09/2019 1123   KETONESUR NEGATIVE 07/09/2019 1123   PROTEINUR NEGATIVE 07/09/2019 1123   UROBILINOGEN 0.2 07/09/2019 1123   NITRITE NEGATIVE 07/09/2019 1123   LEUKOCYTESUR NEGATIVE 07/09/2019 1123   Sepsis Labs Invalid input(s): PROCALCITONIN,  WBC,  LACTICIDVEN Microbiology Recent Results (from the past 240 hour(s))  SARS Coronavirus 2 by RT PCR (hospital order, performed in [facility] hospital lab)  Nasopharyngeal Nasopharyngeal Swab     Status: None   Collection Time: 12/27/19  7:36 PM   Specimen: Nasopharyngeal Swab  Result Value Ref Range Status   SARS Coronavirus 2 NEGATIVE NEGATIVE Final    Comment: (NOTE) SARS-CoV-2 target nucleic acids are NOT DETECTED. The SARS-CoV-2 RNA is generally detectable in upper and lower respiratory specimens during the acute phase of infection. The lowest concentration of SARS-CoV-2 viral copies this assay can detect is 250 copies / mL. A negative result does not preclude SARS-CoV-2 infection and should not be used as the sole basis for treatment or other patient management decisions.  A negative result may occur with improper specimen collection / handling, submission of specimen other than nasopharyngeal swab, presence of viral mutation(s) within the areas targeted by this assay, and inadequate number of viral copies (<250 copies / mL). A negative result must be combined with clinical observations, patient history, and epidemiological information. Fact Sheet for Patients:   StrictlyIdeas.no Fact Sheet for Healthcare Providers: BankingDealers.co.za This test is not yet approved or cleared  by the [place] FDA and has been authorized for detection and/or diagnosis of SARS-CoV-2 by FDA under an Emergency Use Authorization (EUA).  This EUA will remain in effect (meaning this test can be used) for the duration of the COVID-19 declaration under Section 564(b)(1) of the Act, 21 U.S.C. section 360bbb-3(b)(1), unless the authorization is terminated or revoked sooner. Performed at [facility], [place] [address], [place], [place] [address]   MRSA PCR Screening     Status: None   Collection Time: 12/28/19  6:04 AM   Specimen: Nasal Mucosa; Nasopharyngeal  Result Value Ref Range Status   MRSA by PCR NEGATIVE NEGATIVE Final    Comment:        The GeneXpert MRSA Assay (FDA approved  for NASAL specimens only), is one component of a comprehensive MRSA colonization surveillance  program. It is not intended to diagnose MRSA infection nor to guide or monitor treatment for MRSA infections. Performed at [facility], [place] [address] [place], [place] [address]      Time coordinating discharge: 35 minutes  SIGNED:   Jonnie Finner, DO  [facility] 01/03/2020, 7:17 AM   If 7PM-7AM, please contact night-coverage [email or web address]

## 2020-01-03 NOTE — Progress Notes (Signed)
Occupational Therapy Treatment Patient Details Name: Emerick Matzinger MRN: [id number] DOB: [birth year] Today's Date: 01/03/2020    History of present illness 74 year old with metastatic prostate cancer to bone on the palliative radiation, PVD, carotid stenosis, HLD admitted for bilateral lower extremity weakness and recommendations of radiation oncology.  MRI spine showed widespread metastatic disease throughout cervical, thoracic and lumbar spine with epidural tumor T 7 - 9 causing cord deformity.   OT comments    Follow Up Recommendations  No OT follow up;Supervision/Assistance - 24 hour;Other (comment)(plan is for home hospice)    Equipment Recommendations  Other (comment);Hospital bed(could benefit from drop arm commode if family does not have)    Recommendations for Other Services      Precautions / Restrictions Precautions Precautions: Fall Restrictions Weight Bearing Restrictions: No       Mobility Bed Mobility Overal bed mobility: Needs Assistance Bed Mobility: Rolling Rolling: Mod assist(using bed rail to reposition)            Transfers            declined              ADL either performed or assessed with clinical judgement   ADL Overall ADL's : Needs assistance/impaired     Grooming: Wash/dry face;Wash/dry hands;Minimal assistance;Oral care;Bed level Grooming Details (indicate cue type and reason): HOB raised                               General ADL Comments: Plan is now home with Hospice.  Pt will need significant A with ADL activity at home               Cognition Arousal/Alertness: Awake/alert Behavior During Therapy: Flat affect Overall Cognitive Status: Within Functional Limits for tasks assessed                                 General Comments: slow to respond, was able to follow one step commands consistently                   Pertinent Vitals/ Pain       Faces Pain Scale: No hurt  Home  Living                                              Frequency  Min 2X/week        Progress Toward Goals  OT Goals(current goals can now be found in the care plan section)  Progress towards OT goals: Progressing toward goals     Plan Discharge plan remains appropriate       AM-PAC OT "6 Clicks" Daily Activity     Outcome Measure   Help from another person eating meals?: A Little Help from another person taking care of personal grooming?: A Little Help from another person toileting, which includes using toliet, bedpan, or urinal?: Total Help from another person bathing (including washing, rinsing, drying)?: A Lot Help from another person to put on and taking off regular upper body clothing?: A Lot Help from another person to put on and taking off regular lower body clothing?: Total 6 Click Score: 12    End of Session    OT Visit Diagnosis: Muscle weakness (generalized) (M62.81)  Activity Tolerance Patient limited by fatigue   Patient Left in bed;with call bell/phone within reach;with bed alarm set   Nurse Communication Mobility status        Time: [id number] OT Time Calculation (min): 11 min  Charges: OT Treatments $Self Care/Home Management : 8-22 mins  Kari Baars, OT Acute Rehabilitation Services [phone] Office- [phone], [place] 01/03/2020, 2:32 PM

## 2020-01-04 ENCOUNTER — Ambulatory Visit: Payer: Medicare Other

## 2020-01-04 DIAGNOSIS — C61 Malignant neoplasm of prostate: Secondary | ICD-10-CM | POA: Diagnosis not present

## 2020-01-04 DIAGNOSIS — I739 Peripheral vascular disease, unspecified: Secondary | ICD-10-CM | POA: Diagnosis not present

## 2020-01-04 DIAGNOSIS — D630 Anemia in neoplastic disease: Secondary | ICD-10-CM | POA: Diagnosis not present

## 2020-01-04 DIAGNOSIS — E785 Hyperlipidemia, unspecified: Secondary | ICD-10-CM | POA: Diagnosis not present

## 2020-01-04 DIAGNOSIS — L89150 Pressure ulcer of sacral region, unstageable: Secondary | ICD-10-CM | POA: Diagnosis not present

## 2020-01-04 DIAGNOSIS — I6529 Occlusion and stenosis of unspecified carotid artery: Secondary | ICD-10-CM | POA: Diagnosis not present

## 2020-01-04 DIAGNOSIS — I10 Essential (primary) hypertension: Secondary | ICD-10-CM | POA: Diagnosis not present

## 2020-01-04 DIAGNOSIS — Z7401 Bed confinement status: Secondary | ICD-10-CM | POA: Diagnosis not present

## 2020-01-04 DIAGNOSIS — D696 Thrombocytopenia, unspecified: Secondary | ICD-10-CM | POA: Diagnosis not present

## 2020-01-04 DIAGNOSIS — Z87891 Personal history of nicotine dependence: Secondary | ICD-10-CM | POA: Diagnosis not present

## 2020-01-04 DIAGNOSIS — Z741 Need for assistance with personal care: Secondary | ICD-10-CM | POA: Diagnosis not present

## 2020-01-04 DIAGNOSIS — C7951 Secondary malignant neoplasm of bone: Secondary | ICD-10-CM | POA: Diagnosis not present

## 2020-01-05 ENCOUNTER — Ambulatory Visit: Payer: Medicare Other

## 2020-01-05 ENCOUNTER — Other Ambulatory Visit: Payer: Self-pay | Admitting: Medical

## 2020-01-05 DIAGNOSIS — I6529 Occlusion and stenosis of unspecified carotid artery: Secondary | ICD-10-CM | POA: Diagnosis not present

## 2020-01-05 DIAGNOSIS — D696 Thrombocytopenia, unspecified: Secondary | ICD-10-CM | POA: Diagnosis not present

## 2020-01-05 DIAGNOSIS — C7951 Secondary malignant neoplasm of bone: Secondary | ICD-10-CM | POA: Diagnosis not present

## 2020-01-05 DIAGNOSIS — C61 Malignant neoplasm of prostate: Secondary | ICD-10-CM | POA: Diagnosis not present

## 2020-01-05 DIAGNOSIS — D630 Anemia in neoplastic disease: Secondary | ICD-10-CM | POA: Diagnosis not present

## 2020-01-05 DIAGNOSIS — G4709 Other insomnia: Secondary | ICD-10-CM

## 2020-01-05 DIAGNOSIS — R630 Anorexia: Secondary | ICD-10-CM

## 2020-01-05 DIAGNOSIS — I739 Peripheral vascular disease, unspecified: Secondary | ICD-10-CM | POA: Diagnosis not present

## 2020-01-06 ENCOUNTER — Other Ambulatory Visit: Payer: Self-pay | Admitting: Radiation Therapy

## 2020-01-06 ENCOUNTER — Ambulatory Visit (HOSPITAL_COMMUNITY): Payer: Medicare Other

## 2020-01-06 DIAGNOSIS — D696 Thrombocytopenia, unspecified: Secondary | ICD-10-CM | POA: Diagnosis not present

## 2020-01-06 DIAGNOSIS — C61 Malignant neoplasm of prostate: Secondary | ICD-10-CM | POA: Diagnosis not present

## 2020-01-06 DIAGNOSIS — C7951 Secondary malignant neoplasm of bone: Secondary | ICD-10-CM | POA: Diagnosis not present

## 2020-01-06 DIAGNOSIS — I739 Peripheral vascular disease, unspecified: Secondary | ICD-10-CM | POA: Diagnosis not present

## 2020-01-06 DIAGNOSIS — D630 Anemia in neoplastic disease: Secondary | ICD-10-CM | POA: Diagnosis not present

## 2020-01-06 DIAGNOSIS — I6529 Occlusion and stenosis of unspecified carotid artery: Secondary | ICD-10-CM | POA: Diagnosis not present

## 2020-01-06 NOTE — Telephone Encounter (Signed)
Refill request

## 2020-01-07 DIAGNOSIS — I739 Peripheral vascular disease, unspecified: Secondary | ICD-10-CM | POA: Diagnosis not present

## 2020-01-07 DIAGNOSIS — D696 Thrombocytopenia, unspecified: Secondary | ICD-10-CM | POA: Diagnosis not present

## 2020-01-07 DIAGNOSIS — I6529 Occlusion and stenosis of unspecified carotid artery: Secondary | ICD-10-CM | POA: Diagnosis not present

## 2020-01-07 DIAGNOSIS — C7951 Secondary malignant neoplasm of bone: Secondary | ICD-10-CM | POA: Diagnosis not present

## 2020-01-07 DIAGNOSIS — C61 Malignant neoplasm of prostate: Secondary | ICD-10-CM | POA: Diagnosis not present

## 2020-01-07 DIAGNOSIS — D630 Anemia in neoplastic disease: Secondary | ICD-10-CM | POA: Diagnosis not present

## 2020-01-10 ENCOUNTER — Inpatient Hospital Stay: Payer: Medicare Other

## 2020-01-11 ENCOUNTER — Telehealth: Payer: Self-pay

## 2020-01-11 NOTE — Telephone Encounter (Signed)
Correspondence from [facility] and Palliative Care Pt deceased on  01/23/2020

## 2020-01-13 ENCOUNTER — Encounter: Payer: Self-pay | Admitting: Radiation Therapy

## 2020-01-17 DEATH — deceased

## 2020-02-06 IMAGING — NM NM BONE WHOLE BODY
2 series · 2 of 2 positions shown · non-contrast
Comparison: CT 04/27/2019.  Bone scan 08/17/2018.

CLINICAL DATA: Prostate cancer.

EXAM:
NUCLEAR MEDICINE WHOLE BODY BONE SCAN
TECHNIQUE: Whole body anterior and posterior images were obtained approximately
3 hours after intravenous injection of radiopharmaceutical.
RADIOPHARMACEUTICALS:  20.2 mCi Gechnetium-88m MDP IV

[Series 1: wbr_bone_40 whole body · 2.66mm/px · 1 of 1 slices shown (1 of 2)]
[im 1/1]
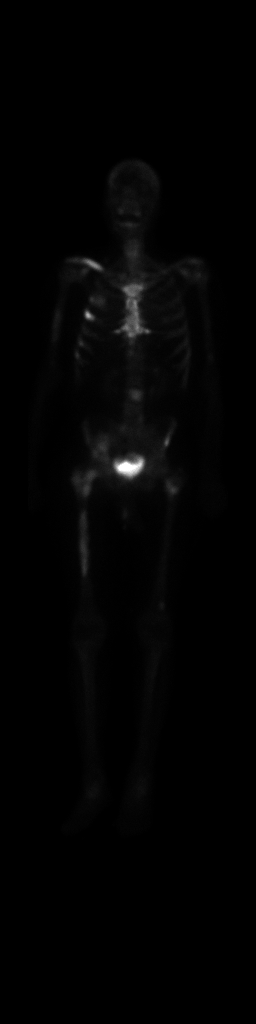

[Series 1: wbr_bone_40 whole body · 2.66mm/px · 1 of 1 slices shown (2 of 2)]
[im 1/1]
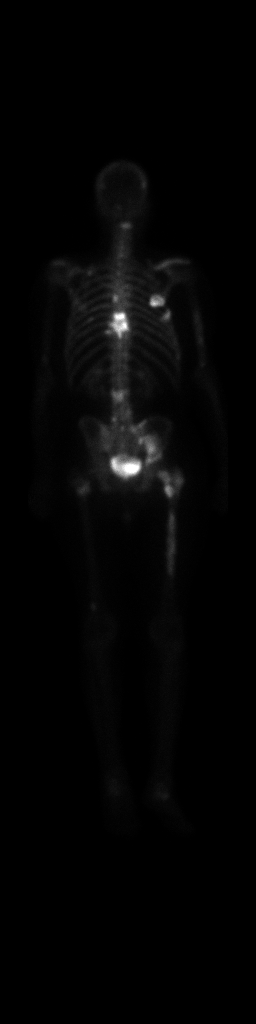

[2 of 2 positions shown; findings below may reference images not displayed]

FINDINGS: Bilateral renal function excretion noted. Diffuse severe multifocal
areas of increased activity are again noted. Increased
radiopharmaceutical uptake is again noted over the right clavicle,
sternum, multiple ribs bilaterally, thoracolumbar spine, pelvis,
right humerus, left and right femur. Right upper posterior rib focus
demonstrates increased activity from prior exam. Previously
identified skull lesions have improved.
IMPRESSION: Diffuse severe multifocal areas of increased radiopharmaceutical
uptake consistent metastatic disease again noted. A right upper
posterior rib focus demonstrates increased activity from prior exam.
Previously identified skull lesions appear to have improved.

## 2020-03-20 NOTE — Progress Notes (Signed)
  Radiation Oncology         [phone] ________________________________  Name: Alec Harris MRN: [id number]  Date: 11/24/2019  DOB: 1945/11/29  End of Treatment Note  Diagnosis:   74 y.o. patient with L3 lumbar metastasis     Indication for treatment:  Palliative       Radiation treatment dates:   11/10/19 - 11/24/19  Site/dose:   The painful lesion at L3 was treated to 30 Gy in 10 fractions.  Beams/energy:   A 3D field set-up was employed with 6 MV X-rays  Narrative: The patient tolerated radiation treatment relatively well.   No acute toxicities were noted.  Plan: The patient has completed radiation treatment. The patient will return to radiation oncology clinic for routine followup in one month. I advised him to call or return sooner if he has any questions or concerns related to his recovery or treatment. ________________________________  Sheral Apley. Tammi Klippel, M.D.

## 2020-03-20 NOTE — Progress Notes (Signed)
  Radiation Oncology         [phone] ________________________________  Name: Alec Harris MRN: [id number]  Date: 01/03/2020  DOB: Jun 15, 1946  End of Treatment Note  Diagnosis:   74 yo man with spinal cord compression at T7-9 from castrate resistant stage IV prostate cancer     Indication for treatment:  Palliative       Radiation treatment dates:   12/28/19 - 01/03/20  Site/dose:    The target at T7-T9 was treated to a total dose of 20 Gy in 5 fractions.  Since he received his first fraction of 4 Gy on 12/28/19 emergently, the remainder of his dose was delivered with 4 subsequent fractions of 4 Gy each, adding 16 Gy.  Beams/energy:   A 3D field set-up was employed with 6 MV x-rays.  Narrative: The patient tolerated radiation treatment relatively well.   There were no acute toxicities noted.  Plan: The patient has completed radiation treatment. The patient will return to radiation oncology clinic for routine followup in one month. I advised him to call or return sooner if he has any questions or concerns related to his recovery or treatment. ________________________________  Sheral Apley. Tammi Klippel, M.D.

## 2023-07-09 NOTE — Telephone Encounter (Signed)
Telephone call
